# Patient Record
Sex: Male | Born: 1955 | Race: White | Hispanic: No | Marital: Married | Smoking: Current some day smoker
Health system: Southern US, Community
[De-identification: ages and names within clinical notes are randomized; demographics above are authoritative.]

## PROBLEM LIST (undated history)

## (undated) DIAGNOSIS — M869 Osteomyelitis, unspecified: Secondary | ICD-10-CM

## (undated) DIAGNOSIS — G4730 Sleep apnea, unspecified: Secondary | ICD-10-CM

## (undated) DIAGNOSIS — I10 Essential (primary) hypertension: Secondary | ICD-10-CM

## (undated) DIAGNOSIS — I495 Sick sinus syndrome: Secondary | ICD-10-CM

## (undated) DIAGNOSIS — J449 Chronic obstructive pulmonary disease, unspecified: Secondary | ICD-10-CM

## (undated) DIAGNOSIS — I2510 Atherosclerotic heart disease of native coronary artery without angina pectoris: Secondary | ICD-10-CM

## (undated) DIAGNOSIS — I4510 Unspecified right bundle-branch block: Secondary | ICD-10-CM

## (undated) DIAGNOSIS — I4819 Other persistent atrial fibrillation: Secondary | ICD-10-CM

## (undated) DIAGNOSIS — I509 Heart failure, unspecified: Secondary | ICD-10-CM

## (undated) DIAGNOSIS — E119 Type 2 diabetes mellitus without complications: Secondary | ICD-10-CM

## (undated) DIAGNOSIS — M5030 Other cervical disc degeneration, unspecified cervical region: Secondary | ICD-10-CM

## (undated) DIAGNOSIS — I4821 Permanent atrial fibrillation: Secondary | ICD-10-CM

## (undated) HISTORY — PX: BACK SURGERY: SHX140

## (undated) HISTORY — PX: CORONARY ARTERY BYPASS GRAFT: SHX141

## (undated) HISTORY — DX: Other persistent atrial fibrillation: I48.19

## (undated) HISTORY — PX: CARDIAC SURGERY: SHX584

## (undated) HISTORY — PX: LAMINECTOMY: SHX219

## (undated) HISTORY — PX: TOE AMPUTATION: SHX809

---
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
## 2009-05-01 HISTORY — PX: PACEMAKER IMPLANT: EP1218

## 2015-10-30 HISTORY — PX: PACEMAKER GENERATOR CHANGE: SHX5998

## 2016-03-08 DIAGNOSIS — E11621 Type 2 diabetes mellitus with foot ulcer: Secondary | ICD-10-CM | POA: Insufficient documentation

## 2016-03-08 DIAGNOSIS — M21371 Foot drop, right foot: Secondary | ICD-10-CM | POA: Insufficient documentation

## 2016-03-08 DIAGNOSIS — L97411 Non-pressure chronic ulcer of right heel and midfoot limited to breakdown of skin: Secondary | ICD-10-CM

## 2017-06-24 DIAGNOSIS — J449 Chronic obstructive pulmonary disease, unspecified: Secondary | ICD-10-CM | POA: Diagnosis present

## 2018-06-26 DIAGNOSIS — E119 Type 2 diabetes mellitus without complications: Secondary | ICD-10-CM

## 2018-06-29 ENCOUNTER — Inpatient Hospital Stay (HOSPITAL_COMMUNITY)
Admission: EM | Admit: 2018-06-29 | Discharge: 2018-07-13 | DRG: 456 | Disposition: A | Discharge status: Skilled Nursing Facility | Payer: BLUE CROSS/BLUE SHIELD | Attending: Student in an Organized Health Care Education/Training Program | Admitting: Student in an Organized Health Care Education/Training Program

## 2018-06-29 ENCOUNTER — Other Ambulatory Visit: Payer: Self-pay

## 2018-06-29 ENCOUNTER — Encounter (HOSPITAL_COMMUNITY): Payer: Self-pay | Admitting: Emergency Medicine

## 2018-06-29 ENCOUNTER — Emergency Department (HOSPITAL_COMMUNITY): Payer: BLUE CROSS/BLUE SHIELD

## 2018-06-29 DIAGNOSIS — Z981 Arthrodesis status: Secondary | ICD-10-CM | POA: Diagnosis not present

## 2018-06-29 DIAGNOSIS — G834 Cauda equina syndrome: Secondary | ICD-10-CM | POA: Diagnosis present

## 2018-06-29 DIAGNOSIS — E11649 Type 2 diabetes mellitus with hypoglycemia without coma: Secondary | ICD-10-CM | POA: Diagnosis present

## 2018-06-29 DIAGNOSIS — R202 Paresthesia of skin: Secondary | ICD-10-CM | POA: Diagnosis present

## 2018-06-29 DIAGNOSIS — Z7401 Bed confinement status: Secondary | ICD-10-CM

## 2018-06-29 DIAGNOSIS — M48061 Spinal stenosis, lumbar region without neurogenic claudication: Principal | ICD-10-CM | POA: Diagnosis present

## 2018-06-29 DIAGNOSIS — J449 Chronic obstructive pulmonary disease, unspecified: Secondary | ICD-10-CM | POA: Diagnosis present

## 2018-06-29 DIAGNOSIS — G8220 Paraplegia, unspecified: Secondary | ICD-10-CM | POA: Diagnosis present

## 2018-06-29 DIAGNOSIS — M21379 Foot drop, unspecified foot: Secondary | ICD-10-CM | POA: Diagnosis present

## 2018-06-29 DIAGNOSIS — J181 Lobar pneumonia, unspecified organism: Secondary | ICD-10-CM | POA: Diagnosis not present

## 2018-06-29 DIAGNOSIS — I110 Hypertensive heart disease with heart failure: Secondary | ICD-10-CM | POA: Diagnosis not present

## 2018-06-29 DIAGNOSIS — Z79899 Other long term (current) drug therapy: Secondary | ICD-10-CM | POA: Diagnosis not present

## 2018-06-29 DIAGNOSIS — Z950 Presence of cardiac pacemaker: Secondary | ICD-10-CM | POA: Diagnosis not present

## 2018-06-29 DIAGNOSIS — R200 Anesthesia of skin: Secondary | ICD-10-CM | POA: Diagnosis present

## 2018-06-29 DIAGNOSIS — I4891 Unspecified atrial fibrillation: Secondary | ICD-10-CM | POA: Diagnosis not present

## 2018-06-29 DIAGNOSIS — I509 Heart failure, unspecified: Secondary | ICD-10-CM | POA: Diagnosis present

## 2018-06-29 DIAGNOSIS — E119 Type 2 diabetes mellitus without complications: Secondary | ICD-10-CM

## 2018-06-29 DIAGNOSIS — E440 Moderate protein-calorie malnutrition: Secondary | ICD-10-CM | POA: Diagnosis present

## 2018-06-29 DIAGNOSIS — J440 Chronic obstructive pulmonary disease with acute lower respiratory infection: Secondary | ICD-10-CM | POA: Diagnosis present

## 2018-06-29 DIAGNOSIS — I340 Nonrheumatic mitral (valve) insufficiency: Secondary | ICD-10-CM | POA: Diagnosis not present

## 2018-06-29 DIAGNOSIS — D638 Anemia in other chronic diseases classified elsewhere: Secondary | ICD-10-CM | POA: Diagnosis present

## 2018-06-29 DIAGNOSIS — Z794 Long term (current) use of insulin: Secondary | ICD-10-CM

## 2018-06-29 DIAGNOSIS — I959 Hypotension, unspecified: Secondary | ICD-10-CM | POA: Diagnosis not present

## 2018-06-29 DIAGNOSIS — I472 Ventricular tachycardia: Secondary | ICD-10-CM | POA: Diagnosis present

## 2018-06-29 DIAGNOSIS — I482 Chronic atrial fibrillation, unspecified: Secondary | ICD-10-CM | POA: Diagnosis present

## 2018-06-29 DIAGNOSIS — Z01818 Encounter for other preprocedural examination: Secondary | ICD-10-CM

## 2018-06-29 DIAGNOSIS — R7881 Bacteremia: Secondary | ICD-10-CM | POA: Diagnosis not present

## 2018-06-29 DIAGNOSIS — K5903 Drug induced constipation: Secondary | ICD-10-CM | POA: Diagnosis not present

## 2018-06-29 DIAGNOSIS — M4804 Spinal stenosis, thoracic region: Secondary | ICD-10-CM | POA: Diagnosis present

## 2018-06-29 DIAGNOSIS — E785 Hyperlipidemia, unspecified: Secondary | ICD-10-CM | POA: Diagnosis present

## 2018-06-29 DIAGNOSIS — I2510 Atherosclerotic heart disease of native coronary artery without angina pectoris: Secondary | ICD-10-CM | POA: Diagnosis present

## 2018-06-29 DIAGNOSIS — R410 Disorientation, unspecified: Secondary | ICD-10-CM | POA: Diagnosis not present

## 2018-06-29 DIAGNOSIS — R7401 Elevation of levels of liver transaminase levels: Secondary | ICD-10-CM

## 2018-06-29 DIAGNOSIS — D649 Anemia, unspecified: Secondary | ICD-10-CM | POA: Diagnosis not present

## 2018-06-29 DIAGNOSIS — Z9181 History of falling: Secondary | ICD-10-CM

## 2018-06-29 DIAGNOSIS — J189 Pneumonia, unspecified organism: Secondary | ICD-10-CM | POA: Diagnosis present

## 2018-06-29 DIAGNOSIS — F17210 Nicotine dependence, cigarettes, uncomplicated: Secondary | ICD-10-CM | POA: Diagnosis present

## 2018-06-29 DIAGNOSIS — E876 Hypokalemia: Secondary | ICD-10-CM | POA: Diagnosis present

## 2018-06-29 DIAGNOSIS — I8312 Varicose veins of left lower extremity with inflammation: Secondary | ICD-10-CM | POA: Diagnosis present

## 2018-06-29 DIAGNOSIS — Z8679 Personal history of other diseases of the circulatory system: Secondary | ICD-10-CM | POA: Diagnosis not present

## 2018-06-29 DIAGNOSIS — I8311 Varicose veins of right lower extremity with inflammation: Secondary | ICD-10-CM | POA: Diagnosis present

## 2018-06-29 DIAGNOSIS — M5136 Other intervertebral disc degeneration, lumbar region: Secondary | ICD-10-CM | POA: Diagnosis present

## 2018-06-29 DIAGNOSIS — G8929 Other chronic pain: Secondary | ICD-10-CM | POA: Diagnosis present

## 2018-06-29 DIAGNOSIS — E875 Hyperkalemia: Secondary | ICD-10-CM | POA: Diagnosis not present

## 2018-06-29 DIAGNOSIS — T402X5A Adverse effect of other opioids, initial encounter: Secondary | ICD-10-CM | POA: Diagnosis not present

## 2018-06-29 DIAGNOSIS — K760 Fatty (change of) liver, not elsewhere classified: Secondary | ICD-10-CM | POA: Diagnosis not present

## 2018-06-29 DIAGNOSIS — Z539 Procedure and treatment not carried out, unspecified reason: Secondary | ICD-10-CM | POA: Diagnosis not present

## 2018-06-29 DIAGNOSIS — M4150 Other secondary scoliosis, site unspecified: Secondary | ICD-10-CM | POA: Diagnosis present

## 2018-06-29 DIAGNOSIS — R64 Cachexia: Secondary | ICD-10-CM | POA: Diagnosis present

## 2018-06-29 DIAGNOSIS — R258 Other abnormal involuntary movements: Secondary | ICD-10-CM | POA: Diagnosis not present

## 2018-06-29 DIAGNOSIS — M7989 Other specified soft tissue disorders: Secondary | ICD-10-CM | POA: Diagnosis not present

## 2018-06-29 DIAGNOSIS — Z6826 Body mass index (BMI) 26.0-26.9, adult: Secondary | ICD-10-CM

## 2018-06-29 DIAGNOSIS — I495 Sick sinus syndrome: Secondary | ICD-10-CM | POA: Diagnosis not present

## 2018-06-29 DIAGNOSIS — I878 Other specified disorders of veins: Secondary | ICD-10-CM | POA: Diagnosis present

## 2018-06-29 DIAGNOSIS — Z751 Person awaiting admission to adequate facility elsewhere: Secondary | ICD-10-CM

## 2018-06-29 DIAGNOSIS — R740 Nonspecific elevation of levels of transaminase and lactic acid dehydrogenase [LDH]: Secondary | ICD-10-CM | POA: Diagnosis not present

## 2018-06-29 DIAGNOSIS — Z9889 Other specified postprocedural states: Secondary | ICD-10-CM | POA: Diagnosis not present

## 2018-06-29 DIAGNOSIS — R296 Repeated falls: Secondary | ICD-10-CM | POA: Diagnosis present

## 2018-06-29 DIAGNOSIS — Z8711 Personal history of peptic ulcer disease: Secondary | ICD-10-CM

## 2018-06-29 DIAGNOSIS — Z419 Encounter for procedure for purposes other than remedying health state, unspecified: Secondary | ICD-10-CM

## 2018-06-29 DIAGNOSIS — Z951 Presence of aortocoronary bypass graft: Secondary | ICD-10-CM

## 2018-06-29 DIAGNOSIS — M21371 Foot drop, right foot: Secondary | ICD-10-CM | POA: Diagnosis present

## 2018-06-29 DIAGNOSIS — I872 Venous insufficiency (chronic) (peripheral): Secondary | ICD-10-CM | POA: Diagnosis not present

## 2018-06-29 DIAGNOSIS — G8918 Other acute postprocedural pain: Secondary | ICD-10-CM | POA: Diagnosis not present

## 2018-06-29 HISTORY — DX: Chronic obstructive pulmonary disease, unspecified: J44.9

## 2018-06-29 HISTORY — DX: Heart failure, unspecified: I50.9

## 2018-06-29 HISTORY — DX: Essential (primary) hypertension: I10

## 2018-06-29 HISTORY — DX: Type 2 diabetes mellitus without complications: E11.9

## 2018-06-29 LAB — COMPREHENSIVE METABOLIC PANEL
ALT: 13 U/L (ref 0–44)
AST: 22 U/L (ref 15–41)
Albumin: 1.8 g/dL — ABNORMAL LOW (ref 3.5–5.0)
Alkaline Phosphatase: 124 U/L (ref 38–126)
Anion gap: 9 (ref 5–15)
BUN: 48 mg/dL — ABNORMAL HIGH (ref 8–23)
CO2: 24 mmol/L (ref 22–32)
Calcium: 7.7 mg/dL — ABNORMAL LOW (ref 8.9–10.3)
Chloride: 104 mmol/L (ref 98–111)
Creatinine, Ser: 1.4 mg/dL — ABNORMAL HIGH (ref 0.61–1.24)
GFR calc Af Amer: >60 mL/min (ref >60)
GFR calc non Af Amer: 53 mL/min — ABNORMAL LOW (ref >60)
Glucose, Bld: 157 mg/dL — ABNORMAL HIGH (ref 70–99)
POTASSIUM: 3.1 mmol/L — AB (ref 3.5–5.1)
Sodium: 137 mmol/L (ref 135–145)
Total Bilirubin: 0.2 mg/dL — ABNORMAL LOW (ref 0.3–1.2)
Total Protein: 4.4 g/dL — ABNORMAL LOW (ref 6.5–8.1)

## 2018-06-29 LAB — INFLUENZA PANEL BY PCR (TYPE A & B)
Influenza A By PCR: NEGATIVE
Influenza B By PCR: NEGATIVE

## 2018-06-29 LAB — CBC WITH DIFFERENTIAL/PLATELET
Abs Immature Granulocytes: 0.1 10*3/uL — ABNORMAL HIGH (ref 0.00–0.07)
Basophils Absolute: 0 10*3/uL (ref 0.0–0.1)
Basophils Relative: 0 %
EOS ABS: 0 10*3/uL (ref 0.0–0.5)
Eosinophils Relative: 0 %
HCT: 36.3 % — ABNORMAL LOW (ref 39.0–52.0)
Hemoglobin: 10.5 g/dL — ABNORMAL LOW (ref 13.0–17.0)
Immature Granulocytes: 1 %
Lymphocytes Relative: 3 %
Lymphs Abs: 0.4 10*3/uL — ABNORMAL LOW (ref 0.7–4.0)
MCH: 25.3 pg — AB (ref 26.0–34.0)
MCHC: 28.9 g/dL — AB (ref 30.0–36.0)
MCV: 87.5 fL (ref 80.0–100.0)
MONO ABS: 0.3 10*3/uL (ref 0.1–1.0)
Monocytes Relative: 2 %
Neutro Abs: 14.1 10*3/uL — ABNORMAL HIGH (ref 1.7–7.7)
Neutrophils Relative %: 94 %
Platelets: 209 10*3/uL (ref 150–400)
RBC: 4.15 MIL/uL — ABNORMAL LOW (ref 4.22–5.81)
RDW: 18.4 % — AB (ref 11.5–15.5)
WBC: 14.9 10*3/uL — ABNORMAL HIGH (ref 4.0–10.5)
nRBC: 0 % (ref 0.0–0.2)

## 2018-06-29 LAB — GLUCOSE, CAPILLARY
Glucose-Capillary: 178 mg/dL — ABNORMAL HIGH (ref 70–99)
Glucose-Capillary: 116 mg/dL — ABNORMAL HIGH (ref 70–99)

## 2018-06-29 LAB — PROTIME-INR
INR: 1 (ref 0.8–1.2)
Prothrombin Time: 12.8 seconds (ref 11.4–15.2)

## 2018-06-29 LAB — C-REACTIVE PROTEIN: CRP: 4.7 mg/dL — ABNORMAL HIGH (ref <1.0)

## 2018-06-29 LAB — APTT: aPTT: 32 seconds (ref 24–36)

## 2018-06-29 LAB — SEDIMENTATION RATE: SED RATE: 14 mm/h (ref 0–16)

## 2018-06-29 LAB — MAGNESIUM: Magnesium: 1.8 mg/dL (ref 1.7–2.4)

## 2018-06-29 MED ORDER — ENOXAPARIN SODIUM 40 MG/0.4ML ~~LOC~~ SOLN
40.0000 mg | SUBCUTANEOUS | Status: DC
  Administered 2018-06-29 – 2018-07-03 (×5): 40 mg via SUBCUTANEOUS
  Filled 2018-06-29 (×5): qty 0.4

## 2018-06-29 MED ORDER — OXYCODONE-ACETAMINOPHEN 5-325 MG PO TABS
1.0000 | ORAL_TABLET | Freq: Four times a day (QID) | ORAL | Status: DC | PRN
  Administered 2018-06-29 – 2018-06-30 (×3): 2 via ORAL
  Filled 2018-06-29: qty 2
  Filled 2018-06-29: qty 1
  Filled 2018-06-29 (×2): qty 2

## 2018-06-29 MED ORDER — POTASSIUM CHLORIDE CRYS ER 20 MEQ PO TBCR
40.0000 meq | EXTENDED_RELEASE_TABLET | Freq: Once | ORAL | Status: AC
  Administered 2018-06-29: 40 meq via ORAL
  Filled 2018-06-29: qty 2

## 2018-06-29 MED ORDER — ONDANSETRON HCL 4 MG/2ML IJ SOLN: 4.0000 mg | Freq: Four times a day (QID) | INTRAMUSCULAR | Status: DC | PRN

## 2018-06-29 MED ORDER — OXYCODONE-ACETAMINOPHEN 5-325 MG PO TABS
1.0000 | ORAL_TABLET | Freq: Once | ORAL | Status: AC
  Administered 2018-06-29: 1 via ORAL
  Filled 2018-06-29: qty 1

## 2018-06-29 MED ORDER — INSULIN GLARGINE 100 UNIT/ML ~~LOC~~ SOLN
12.0000 [IU] | Freq: Every day | SUBCUTANEOUS | Status: DC
  Administered 2018-06-29: 12 [IU] via SUBCUTANEOUS
  Filled 2018-06-29: qty 0.12

## 2018-06-29 MED ORDER — PANTOPRAZOLE SODIUM 40 MG PO TBEC
40.0000 mg | DELAYED_RELEASE_TABLET | Freq: Two times a day (BID) | ORAL | Status: DC
  Administered 2018-06-29 – 2018-07-04 (×11): 40 mg via ORAL
  Filled 2018-06-29 (×12): qty 1

## 2018-06-29 MED ORDER — SODIUM CHLORIDE 0.9 % IV SOLN
1.0000 g | INTRAVENOUS | Status: DC
  Administered 2018-06-30 – 2018-07-04 (×5): 1 g via INTRAVENOUS
  Filled 2018-06-29 (×6): qty 10

## 2018-06-29 MED ORDER — INSULIN ASPART 100 UNIT/ML ~~LOC~~ SOLN
0.0000 [IU] | Freq: Every day | SUBCUTANEOUS | Status: DC
  Administered 2018-06-30: 3 [IU] via SUBCUTANEOUS
  Administered 2018-07-02 – 2018-07-04 (×2): 2 [IU] via SUBCUTANEOUS
  Administered 2018-07-06: 3 [IU] via SUBCUTANEOUS
  Administered 2018-07-07: 100 [IU] via SUBCUTANEOUS
  Administered 2018-07-10: 2 [IU] via SUBCUTANEOUS

## 2018-06-29 MED ORDER — ZOLPIDEM TARTRATE 5 MG PO TABS
5.0000 mg | ORAL_TABLET | Freq: Every evening | ORAL | Status: DC | PRN
  Administered 2018-06-29 – 2018-07-08 (×8): 5 mg via ORAL
  Filled 2018-06-29 (×8): qty 1

## 2018-06-29 MED ORDER — IPRATROPIUM-ALBUTEROL 0.5-2.5 (3) MG/3ML IN SOLN
3.0000 mL | Freq: Once | RESPIRATORY_TRACT | Status: AC
  Administered 2018-06-29: 3 mL via RESPIRATORY_TRACT
  Filled 2018-06-29: qty 3

## 2018-06-29 MED ORDER — DEXTROSE 5 % IV SOLN: 250.0000 mg | INTRAVENOUS | Status: DC

## 2018-06-29 MED ORDER — SODIUM CHLORIDE 0.9 % IV SOLN
500.0000 mg | Freq: Once | INTRAVENOUS | Status: AC
  Administered 2018-06-29: 500 mg via INTRAVENOUS
  Filled 2018-06-29: qty 500

## 2018-06-29 MED ORDER — SODIUM CHLORIDE 0.9 % IV SOLN
500.0000 mg | INTRAVENOUS | Status: DC
  Administered 2018-06-30 – 2018-07-01 (×2): 500 mg via INTRAVENOUS
  Filled 2018-06-29 (×3): qty 500

## 2018-06-29 MED ORDER — DIGOXIN 125 MCG PO TABS
0.1250 mg | ORAL_TABLET | Freq: Every day | ORAL | Status: DC
  Administered 2018-07-01 – 2018-07-13 (×11): 0.125 mg via ORAL
  Filled 2018-06-29 (×13): qty 1

## 2018-06-29 MED ORDER — ACETAMINOPHEN 500 MG PO TABS
1000.0000 mg | ORAL_TABLET | Freq: Four times a day (QID) | ORAL | Status: DC | PRN
  Administered 2018-07-01 – 2018-07-05 (×11): 1000 mg via ORAL
  Filled 2018-06-29 (×11): qty 2

## 2018-06-29 MED ORDER — INSULIN ASPART 100 UNIT/ML ~~LOC~~ SOLN: 3.0000 [IU] | Freq: Three times a day (TID) | SUBCUTANEOUS | Status: DC

## 2018-06-29 MED ORDER — SODIUM CHLORIDE 0.9 % IV SOLN
1.0000 g | Freq: Once | INTRAVENOUS | Status: AC
  Administered 2018-06-29: 1 g via INTRAVENOUS
  Filled 2018-06-29: qty 10

## 2018-06-29 MED ORDER — ATORVASTATIN CALCIUM 40 MG PO TABS
40.0000 mg | ORAL_TABLET | Freq: Every day | ORAL | Status: DC
  Administered 2018-06-29 – 2018-07-13 (×12): 40 mg via ORAL
  Filled 2018-06-29 (×14): qty 1

## 2018-06-29 MED ORDER — METOPROLOL SUCCINATE ER 100 MG PO TB24
100.0000 mg | ORAL_TABLET | Freq: Two times a day (BID) | ORAL | Status: DC
  Administered 2018-06-29: 100 mg via ORAL
  Filled 2018-06-29 (×2): qty 1

## 2018-06-29 MED ORDER — INSULIN ASPART 100 UNIT/ML ~~LOC~~ SOLN
0.0000 [IU] | Freq: Three times a day (TID) | SUBCUTANEOUS | Status: DC
  Administered 2018-06-29 – 2018-06-30 (×3): 2 [IU] via SUBCUTANEOUS
  Administered 2018-07-01: 4 [IU] via SUBCUTANEOUS
  Administered 2018-07-01: 2 [IU] via SUBCUTANEOUS
  Administered 2018-07-01: 7 [IU] via SUBCUTANEOUS
  Administered 2018-07-02: 2 [IU] via SUBCUTANEOUS
  Administered 2018-07-03: 1 [IU] via SUBCUTANEOUS
  Administered 2018-07-03 – 2018-07-04 (×3): 2 [IU] via SUBCUTANEOUS
  Administered 2018-07-06: 1 [IU] via SUBCUTANEOUS
  Administered 2018-07-06: 3 [IU] via SUBCUTANEOUS
  Administered 2018-07-06: 2 [IU] via SUBCUTANEOUS
  Administered 2018-07-07 – 2018-07-08 (×2): 1 [IU] via SUBCUTANEOUS
  Administered 2018-07-09: 2 [IU] via SUBCUTANEOUS
  Administered 2018-07-09 – 2018-07-13 (×4): 1 [IU] via SUBCUTANEOUS

## 2018-06-29 MED ORDER — ONDANSETRON HCL 4 MG PO TABS
4.0000 mg | ORAL_TABLET | Freq: Four times a day (QID) | ORAL | Status: DC | PRN
  Administered 2018-07-02: 4 mg via ORAL
  Filled 2018-06-29 (×2): qty 1

## 2018-06-29 MED ORDER — IPRATROPIUM-ALBUTEROL 0.5-2.5 (3) MG/3ML IN SOLN
3.0000 mL | Freq: Four times a day (QID) | RESPIRATORY_TRACT | Status: DC | PRN
  Administered 2018-06-29 – 2018-07-01 (×5): 3 mL via RESPIRATORY_TRACT
  Filled 2018-06-29 (×4): qty 3

## 2018-06-29 MED ORDER — DEXTROSE 5 % IV SOLN: 250.0000 mg | Freq: Once | INTRAVENOUS | Status: DC

## 2018-06-29 MED ORDER — SODIUM CHLORIDE 0.9 % IV BOLUS
500.0000 mL | Freq: Once | INTRAVENOUS | Status: AC
  Administered 2018-06-29: 500 mL via INTRAVENOUS

## 2018-06-29 NOTE — Consult Note (Signed)
Reason for Consult:paraparesis Referring Physician: [facility] department  Sharlene Motts Kitchens is an 63 y.o. male.  HPI: 63 year old gentleman has had long-standing difficulties with his back he previously undergone a multilevel decompressive laminectomy 13 years ago who presented to my office a few weeks ago with bilateral foot drops and looks to me weakness of the been building and progressing for the several weeks leading into that. We ordered a CT myelogram as the patient has pacemaker was unable to get an MRI scan. 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 This still spinal fluid flow there the distal part of spinal cord the biggest part of his weakness of his being generated by his complete block at L4 however patient looks significantly cachectic malnourished and I think there is a possibility could have an underlying infection is not yet been diagnosed. With a significant multiple medical comorbidities I recommended we transitioned over to the ER be admitted to medicine undergo a full preoperative workup and evaluation and then I will plan decompression and stabilization procedure more likely Saturday.  Past Medical History:  Diagnosis Date  . CHF (congestive heart failure) (HCC)   . COPD (chronic obstructive pulmonary disease) (HCC)   . Diabetes mellitus without complication (HCC)   . Hypertension     Past Surgical  History:  Procedure Laterality Date  . BACK SURGERY    . CARDIAC SURGERY      No family history on file.  Social History:  has no history on file for tobacco, alcohol, and drug.  Allergies: No Known Allergies  Medications: I have reviewed the patient's current medications.  Results for orders placed or performed during the hospital encounter of 06/29/18 (from the past 48 hour(s))  CBC with Differential     Status: Abnormal   Collection Time: 06/29/18  2:59 PM  Result Value Ref Range   WBC 14.9 (H) 4.0 - 10.5 K/uL   RBC 4.15 (L) 4.22 - 5.81 MIL/uL   Hemoglobin 10.5 (L) 13.0 - 17.0 g/dL   HCT 88.2 (L) 80.0 - 34.9 %   MCV 87.5 80.0 - 100.0 fL   MCH 25.3 (L) 26.0 - 34.0 pg   MCHC 28.9 (L) 30.0 - 36.0 g/dL   RDW 17.9 (H) 15.0 - 56.9 %   Platelets 209 150 - 400 K/uL   nRBC 0.0 0.0 - 0.2 %   Neutrophils Relative % 94 %   Neutro Abs 14.1 (H) 1.7 - 7.7 K/uL   Lymphocytes Relative 3 %   Lymphs Abs 0.4 (L) 0.7 - 4.0 K/uL   Monocytes Relative 2 %   Monocytes Absolute 0.3 0.1 - 1.0 K/uL   Eosinophils Relative 0 %   Eosinophils Absolute 0.0 0.0 - 0.5 K/uL   Basophils Relative 0 %   Basophils Absolute 0.0 0.0 - 0.1 K/uL   Immature Granulocytes 1 %   Abs Immature Granulocytes 0.10 (H) 0.00 - 0.07 K/uL    Comment: Performed at [facility]  Hospital Lab, [address] [place], [place] [address]  Comprehensive metabolic panel     Status: Abnormal   Collection Time: 06/29/18  2:59 PM  Result Value Ref Range   Sodium 137 135 - 145 mmol/L   Potassium 3.1 (L) 3.5 - 5.1 mmol/L   Chloride 104 98 - 111 mmol/L   CO2 24 22 - 32 mmol/L   Glucose, Bld 157 (H) 70 - 99 mg/dL   BUN 48 (H) 8 - 23 mg/dL   Creatinine, Ser 7.22 (H) 0.61 - 1.24 mg/dL   Calcium 7.7 (L) 8.9 - 10.3 mg/dL   Total Protein 4.4 (L) 6.5 - 8.1 g/dL   Albumin 1.8 (L) 3.5 - 5.0 g/dL   AST 22 15 - 41 U/L   ALT 13 0 - 44 U/L   Alkaline Phosphatase 124 38 - 126 U/L   Total Bilirubin 0.2 (L) 0.3 - 1.2 mg/dL   GFR calc non Af Amer 53  (L) >60 mL/min   GFR calc Af Amer >60 >60 mL/min   Anion gap 9 5 - 15    Comment: Performed at [facility], [address], [place], [place] [address]  Sedimentation rate     Status: None   Collection Time: 06/29/18  2:59 PM  Result Value Ref Range   Sed Rate 14 0 - 16 mm/hr    Comment: Performed at [facility], [address] [place], [place] [address]  C-reactive protein     Status: Abnormal   Collection Time: 06/29/18  2:59 PM  Result Value Ref Range   CRP 4.7 (H) <1.0 mg/dL    Comment: Performed at [facility], [address] [place], [place] [address]  APTT     Status: None   Collection Time: 06/29/18  2:59 PM  Result Value Ref Range   aPTT 32 24 - 36 seconds    Comment: Performed at [facility] Lab, [address], [place], [place] [address]  Protime-INR     Status: None   Collection Time: 06/29/18  2:59 PM  Result Value Ref Range   Prothrombin Time 12.8 11.4 - 15.2 seconds   INR 1.0 0.8 - 1.2    Comment: (NOTE) INR goal varies based on device and disease states. Performed at [facility] Lab, [address] [place], [place] [address]   Influenza panel by PCR (type A & B)     Status: None   Collection Time: 06/29/18  3:06 PM  Result Value Ref Range   Influenza A By PCR NEGATIVE NEGATIVE   Influenza B By PCR NEGATIVE NEGATIVE    Comment: (NOTE) The Xpert Xpress Flu assay is intended as an aid in the diagnosis of  influenza and should not be used as a sole basis for treatment.  This  assay is FDA approved for nasopharyngeal swab specimens only. Nasal  washings and aspirates are unacceptable for Xpert Xpress Flu testing. Performed at [facility] Lab, [address] [place], [place] [address]   Magnesium     Status: None   Collection Time: 06/29/18  3:54 PM  Result Value Ref Range   Magnesium 1.8 1.7 - 2.4 mg/dL    Comment: Performed at [facility], [address], [place], [place] [address]    Dg Chest 2 View  Result Date:  06/29/2018 CLINICAL DATA:  Cough for 1 week. EXAM: CHEST - 2 VIEW COMPARISON:  None. FINDINGS: There is airspace disease  in the right lower lobe most consistent with pneumonia. Left lung is clear. Heart size is mildly enlarged. The patient is status post CABG with a pacing device in place. No pneumothorax or pleural fluid. No acute or focal bony abnormality. IMPRESSION: Right lower lobe airspace disease most consistent with pneumonia. Electronically Signed   By: Drusilla Kanner M.D.   On: 06/29/2018 15:03    Review of Systems  Gastrointestinal: Positive for diarrhea.  Musculoskeletal: Positive for back pain.  Neurological: Positive for sensory change and focal weakness.   Blood pressure 98/80, pulse 60, temperature 98.1 F (36.7 C), temperature source Oral, resp. rate 19, height 6\' 2"  (1.88 m), weight 92.5 kg, SpO2 92 %. Physical Exam  Constitutional: He is oriented to person, place, and time.  Neurological: He is alert and oriented to person, place, and time. GCS eye subscore is 4. GCS verbal subscore is 5. GCS motor subscore is 6.  Patient has significant lower Cordelia Pen para para Mare Loan is 4-4 minus out of 5 iliopsoas bilaterally 44 minus out of 5 quadriceps he has bilateral complete foot drops sensation appears to be grossly intact to light touch    Assessment/Plan: 63 year old with progressive para para cyst and what looks like cauda equina syndrome this now been going on for over a week L most 2 weeks para Danielle Dess is almost over a month. Myelogram shows complete block and we will plan decompressive laminectomy and fusion however we need to do a preoperative medical assessment rule out any underlying infection check his white blood cell count is sedimentation rate and C-reactive protein. Medical clearance for a L3 to L S1 decompression fusion.  Alfonso Carden P 06/29/2018, 6:03 PM

## 2018-06-29 NOTE — ED Notes (Signed)
Patient transported to X-ray 

## 2018-06-29 NOTE — ED Triage Notes (Signed)
Pt presents to ED from neurosurgery office via patient transport from [place]  where he lives. Pt had pre-op visit today for neurosurgery on Saturday and transport sts the office wanted him to be admitted today for increased pain, drop foot, and loss of bowel control.

## 2018-06-29 NOTE — H&P (Addendum)
Date: 06/29/2018               Patient Name:  Henry Mcpherson MRN: [id number]  DOB: 03/15/1956 Age / Sex: 63 y.o., male   PCP: System, Pcp Not In         Medical Service: Internal Medicine Teaching Service         Attending Physician: Dr. Earl Lagos, MD    First Contact: Dr. Maryla Morrow Pager: [phone]  Second Contact: Dr. Frances Furbish Pager: [phone]       After Hours (After 5p/  First Contact Pager: [phone]  weekends / holidays): Second Contact Pager: [phone]   Chief Complaint: Low back pain, lower extremity weakness  History of Present Illness: 63 year old male with past medical history significant for CHF, A. fib, DM 2, lumbar stenosis, worsening of back pain, inability to walk.  He has history of lumbar stenosis status post fusion surgery 15 years ago, but mentions that since December his back pain got worse in any few weeks, he developed some lower extremity weakness as well as tingling.  Since last week patient was not able to walk and almost always at bed due to lower extremity weakness and back pain.  Per family, he also had fecal incontinency.  He has had multiple falls due to weakness and balance issue.  No upper extremity weakness.  His pain has been 8-9/10.  He only took Tylenol at home that did not help significantly. (Cannot take NSAIDs due to history of peptic ulcer disease).  He saw neurosurgery (Dr. Wynetta Emery outpatient) and was recommended surgery. (No imagine or note available in chart yet). He was advised to come to ED for pre-op evaluation and surgery. In ED he was found to have pneumonia. Patient denies any fever or chills. He feels short of breath sometimes because he can not take deep breath due to back pain. No sick contact.   Meds:  Current Meds  Medication Sig  . acetaminophen (TYLENOL) 500 MG tablet Take 1,000 mg by mouth every 6 (six) hours as needed for mild pain.  Marland Kitchen albuterol (PROVENTIL HFA;VENTOLIN HFA) 108 (90 Base) MCG/ACT inhaler Inhale 1-2 puffs into  the lungs every 6 (six) hours as needed for wheezing or shortness of breath.  Marland Kitchen atorvastatin (LIPITOR) 40 MG tablet Take 40 mg by mouth daily.  . digoxin (LANOXIN) 0.125 MG tablet Take 0.125 mg by mouth daily.  . furosemide (LASIX) 40 MG tablet Take 40 mg by mouth daily.  Marland Kitchen LEVEMIR FLEXTOUCH 100 UNIT/ML Pen Inject 25-30 Units into the skin every evening.   . metoprolol succinate (TOPROL-XL) 100 MG 24 hr tablet Take 100 mg by mouth 2 (two) times daily.  . pantoprazole (PROTONIX) 40 MG tablet Take 40 mg by mouth 2 (two) times daily.  . Probiotic Product (PROBIOTIC DAILY PO) Take 1 capsule by mouth daily.  Marland Kitchen zolpidem (AMBIEN) 10 MG tablet Take 5 mg by mouth at bedtime as needed for sleep.      Allergies: Allergies as of 06/29/2018  . (No Known Allergies)   Past Medical History:  Diagnosis Date  . CHF (congestive heart failure) (HCC)   . COPD (chronic obstructive pulmonary disease) (HCC)   . Diabetes mellitus without complication (HCC)   . Hypertension     Family History:  No family Hx  Social History:  Smokes 1 pack of cigarettes every day Drinks alcohol occasionally No illicit drug use  Review of Systems: A complete ROS was negative except as per HPI.  Physical Exam: Blood pressure 98/80, pulse 60, temperature 98.1 F (36.7 C), temperature source Oral, resp. rate 19, height 6\' 2"  (1.88 m), weight 92.5 kg, SpO2 92 %. Physical Exam Constitutional:      Appearance: He is ill-appearing.  HENT:     Head: Normocephalic and atraumatic.     Mouth/Throat:     Mouth: Mucous membranes are dry.  Eyes:     Extraocular Movements: Extraocular movements intact.  Cardiovascular:     Heart sounds: Normal heart sounds. No murmur.  Pulmonary:     Effort: Pulmonary effort is normal.     Breath sounds: Examination of the left-upper field reveals decreased breath sounds. Examination of the left-middle field reveals decreased breath sounds. Decreased breath sounds and rhonchi present.    Abdominal:     General: Bowel sounds are normal.     Palpations: Abdomen is soft.     Tenderness: There is no abdominal tenderness.  Musculoskeletal:        General: No swelling or tenderness.     Right lower leg: Edema present.     Left lower leg: Edema present.     Comments: Distal of lower extremities are cold. Pulses are weak. 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 Has 1+ bilateral pitting edema.  Neurological:     Mental Status: He is oriented to person, place, and time.     Comments: Sensation is normal and symmetric. Bilateral lower extremities motor:  3-4/5      EKG: personally reviewed my interpretation is borderline bradycardia, afib, Q wave in ant leads. borderline QD  CXR: personally reviewed my interpretation is right lower lobe opacity  Assessment & Plan by Problem: Active Problems:   Community acquired pneumonia  63 y.o. male with PMHx of  COPD, CHF, T2DM, Afib, pace maker, lumbar disc disease s/p fusion wo scented with new pneumonia as well as worsening neurological symptoms consistent with worsening spinal stenosis.   Lumbar stenosis: Patient with history of lumbar stenosis status post surgery several years ago, presented with worsening of back pain, as well as neurologic symptoms: bilateral lower extremity weakness, tingling, fecal incontinency.   He has not been able to walk since last week.  Patient was seen neurosurgeon (Dr. Wynetta Emery) out patient and was recommended surgery. Patient came to ED today due to worsening of symptoms and not feeling good generally.  Neurosurgery consulted will see this evening.   -Percocet 5-325 mg, 1-2 tablets every 6 hours PRN for severe pain -Consult to neurosurgery, appreciate their recommendation -Tylenol 1 g every 6 hours PRN for mild pain -Zofran 4 mg p.o. every 6 hours PRN (be causious with boredrline QT) -Cardiac monitoring -CBC tomorrow -CMP tomorrow -HIV antibody -INR-->1  CAP COPD Patient with productive cough today at  ED. (Denies major cough at home.  No fever or chills.  No shortness of breath)  Chest x-ray with evidence of pneumonia.  Has leukocytosis at 14000. Treating him for CAP.  Does not seem to be COPD exacerbation.   -Azithromycin 500 mg IV -Ceftriaxone 1 g IV -DuoNeb nebulizer every 6 hours PRN -Flu test--> Negative -f/u blood culture   CAD status post CABG CHF (no further information on chart) Afib Volume status is okay except some bilateral lower extremity edema.  Is on Digoxin and metoprolol at home. No RVR (currently pulse rate around 60s)  -Digoxin level -Lipitor 40 mg daily -Digoxin 0.125 mg p.o. daily -Metoprolol succinate 100 mg p.o. twice daily -Mg: Nl 1.8   Insulin dependent type 2  DM:  -NovoLog 3 units subcu 3 times daily -Lantus 12 units subcu at bedtime -Sensitive SSI -CBG monitoring   Anemia: Patient looks pale on exam. Per family, this has been due to gi ulcer. No previous lab test on chart. CBC at ED with normocytic anemia with Hb 10. Can be anemia of chronic disease.   -Monitor CBC -Although normocytic, can still be IDA. Will check Ferritin after acute illness improves. -Protonix -Monitor Hb and symptoms as he is on Levonox  dispo: Admit patient to Inpatient with expected length of stay greater than 2 midnights.   Diet: HH/CM (then N.p.o. for surgery) IV fluid: VTE ppx: Lovenox Code status: Full  Signed: Chevis Pretty, MD 06/29/2018, 6:36 PM  Pager: [phone]

## 2018-06-29 NOTE — ED Provider Notes (Signed)
[name]   CSN: [id number] Arrival date & time: 06/29/18  1239    History   Chief Complaint Chief Complaint  Patient presents with  . Pre-op Exam    HPI Henry Mcpherson is a 63 y.o. male.     HPI   Pt is a 63 y/o male with a h/o CHF, COPD, DM, HTN, HLD, pAfib, anemia, PUD, lumbar fusion (15 years ago) who presents to the ED for evaluation. Pt states that he was seen by his neurosurgeon, Dr. Wynetta Emery, PTA for evaluation of ongoing back pain that has been worsening over the last several weeks. He was seen in the office PTA and was advised to come to the ED for pre-op evaluation and admission to the hospital in anticipation of surgery.  Pt notes that he has a h/o chronic back pain s/p lumbar fusion 15 years ago and after starting PT recently his back pain has worsened. Describes severe pain to the mid lower back that is constant. He has tried ibuprofen without significant relief.  He states he has had foot drop for some time and over the last 1 to 2 weeks he has had progressing bilateral lower extremity weakness.  He has also recently developed loss of bowel control.  He denies loss of bladder control or urinary retention.  He denies numbness to his legs.  He denies any recent fevers.  He denies chest pain or shortness of breath.  He has been coughing recently.  He states that he swallowed some water run yesterday and since then he has been coughing.  No other URI symptoms.  No significant abdominal pain.  No nausea vomiting or diarrhea.  He denies any bloody stools dark stools.  Past Medical History:  Diagnosis Date  . CHF (congestive heart failure) (HCC)   . COPD (chronic obstructive pulmonary disease) (HCC)   . Diabetes mellitus without complication (HCC)   . Hypertension     Patient Active Problem List   Diagnosis Date Noted  . Community acquired pneumonia 06/29/2018    Past Surgical History:  Procedure Laterality Date    . BACK SURGERY    . CARDIAC SURGERY          Home Medications    Prior to Admission medications   Medication Sig Start Date End Date Taking? Authorizing Provider  acetaminophen (TYLENOL) 500 MG tablet Take 1,000 mg by mouth every 6 (six) hours as needed for mild pain.   Yes [provider]  albuterol (PROVENTIL HFA;VENTOLIN HFA) 108 (90 Base) MCG/ACT inhaler Inhale 1-2 puffs into the lungs every 6 (six) hours as needed for wheezing or shortness of breath.   Yes [provider]  atorvastatin (LIPITOR) 40 MG tablet Take 40 mg by mouth daily.   Yes [provider]  digoxin (LANOXIN) 0.125 MG tablet Take 0.125 mg by mouth daily. 05/04/18  Yes [provider]  furosemide (LASIX) 40 MG tablet Take 40 mg by mouth daily.   Yes [provider]  LEVEMIR FLEXTOUCH 100 UNIT/ML Pen Inject 25-30 Units into the skin every evening.  06/26/18  Yes [provider]  metoprolol succinate (TOPROL-XL) 100 MG 24 hr tablet Take 100 mg by mouth 2 (two) times daily. 06/16/18  Yes [provider]  pantoprazole (PROTONIX) 40 MG tablet Take 40 mg by mouth 2 (two) times daily. 05/26/18  Yes [provider]  Probiotic Product (PROBIOTIC DAILY PO) Take 1 capsule by mouth daily.   Yes  [provider]  zolpidem (AMBIEN) 10 MG tablet Take 5 mg by mouth at bedtime as needed for sleep.    Yes [provider]    Family History No family history on file.  Social History Social History   Tobacco Use  . Smoking status: Not on file  Substance Use Topics  . Alcohol use: Not on file  . Drug use: Not on file     Allergies   Patient has no known allergies.   Review of Systems Review of Systems  Constitutional: Negative for chills and fever.  HENT: Negative for ear pain and sore throat.   Eyes: Negative for pain and visual disturbance.  Respiratory: Negative for cough and shortness of breath.   Cardiovascular: Negative for chest  pain and palpitations.  Gastrointestinal: Negative for abdominal pain and vomiting.  Genitourinary: Negative for dysuria and hematuria.  Musculoskeletal: Negative for arthralgias and back pain.  Skin: Negative for color change and rash.  Neurological: Negative for seizures and syncope.  All other systems reviewed and are negative.    Physical Exam Updated Vital Signs BP 100/60   Pulse 66   Temp 98.5 F (36.9 C) (Oral)   Resp 17   Ht 6\' 2"  (1.88 m)   Wt 92.5 kg   SpO2 98%   BMI 26.19 kg/m   Physical Exam Vitals signs and nursing note reviewed.  Constitutional:      Appearance: He is well-developed.     Comments: Chronically ill appearing  HENT:     Head: Normocephalic and atraumatic.     Mouth/Throat:     Mouth: Mucous membranes are dry.  Eyes:     Conjunctiva/sclera: Conjunctivae normal.  Neck:     Musculoskeletal: Neck supple.  Cardiovascular:     Rate and Rhythm: Normal rate and regular rhythm.     Heart sounds: Normal heart sounds. No murmur.  Pulmonary:     Effort: Pulmonary effort is normal. No respiratory distress.     Breath sounds: Wheezing, rhonchi and rales (RLL) present.     Comments: No tachypnea. Speaking in full sentences Abdominal:     General: Bowel sounds are normal.     Palpations: Abdomen is soft.     Tenderness: There is no abdominal tenderness. There is no guarding or rebound.  Musculoskeletal:     Comments: Trace ble edema. Multiple well healing ulcers to bilat feet without evidence of infection. Midline TTP to the lumbar spine which reproduces pain.   Skin:    General: Skin is warm and dry.  Neurological:     Mental Status: He is alert.     Comments: 3/5 strength to ble with hip flexion, knee flexion/extension, dorsiflexion and plantarflexion. Plantarflexion and dorsiflexion is weaker on the right side, 2/5. Sensation is grossly intact to BLE.       ED Treatments / Results  Labs (all labs ordered are listed, but only abnormal results  are displayed) Labs Reviewed  CBC WITH DIFFERENTIAL/PLATELET - Abnormal; Notable for the following components:      Result Value   WBC 14.9 (*)    RBC 4.15 (*)    Hemoglobin 10.5 (*)    HCT 36.3 (*)    MCH 25.3 (*)    MCHC 28.9 (*)    RDW 18.4 (*)    Neutro Abs 14.1 (*)    Lymphs Abs 0.4 (*)    Abs Immature Granulocytes 0.10 (*)    All other components within normal limits  COMPREHENSIVE METABOLIC  PANEL - Abnormal; Notable for the following components:   Potassium 3.1 (*)    Glucose, Bld 157 (*)    BUN 48 (*)    Creatinine, Ser 1.40 (*)    Calcium 7.7 (*)    Total Protein 4.4 (*)    Albumin 1.8 (*)    Total Bilirubin 0.2 (*)    GFR calc non Af Amer 53 (*)    All other components within normal limits  C-REACTIVE PROTEIN - Abnormal; Notable for the following components:   CRP 4.7 (*)    All other components within normal limits  CULTURE, BLOOD (ROUTINE X 2)  CULTURE, BLOOD (ROUTINE X 2)  SEDIMENTATION RATE  INFLUENZA PANEL BY PCR (TYPE A & B)  APTT  PROTIME-INR  MAGNESIUM  DIGOXIN LEVEL  HIV ANTIBODY (ROUTINE TESTING W REFLEX)  COMPREHENSIVE METABOLIC PANEL  CBC  PROTIME-INR    EKG EKG Interpretation  Date/Time:  Thursday June 29 2018 13:59:59 EST Ventricular Rate:  82 PR Interval:    QRS Duration: 177 QT Interval:  475 QTC Calculation: 555 R Axis:   22 Text Interpretation:  sinus? Left bundle branch block No old tracing to compare Reconfirmed by Raeford Razor [phone]) on 06/29/2018 2:57:31 PM   Radiology Dg Chest 2 View  Result Date: 06/29/2018 CLINICAL DATA:  Cough for 1 week. EXAM: CHEST - 2 VIEW COMPARISON:  None. FINDINGS: There is airspace disease in the right lower lobe most consistent with pneumonia. Left lung is clear. Heart size is mildly enlarged. The patient is status post CABG with a pacing device in place. No pneumothorax or pleural fluid. No acute or focal bony abnormality. IMPRESSION: Right lower lobe airspace disease most consistent with  pneumonia. Electronically Signed   By: Drusilla Kanner M.D.   On: 06/29/2018 15:03    Procedures Procedures (including critical care time)  Medications Ordered in ED Medications  atorvastatin (LIPITOR) tablet 40 mg (has no administration in time range)  digoxin (LANOXIN) tablet 0.125 mg (has no administration in time range)  pantoprazole (PROTONIX) EC tablet 40 mg (has no administration in time range)  zolpidem (AMBIEN) tablet 5 mg (has no administration in time range)  metoprolol succinate (TOPROL-XL) 24 hr tablet 100 mg (has no administration in time range)  acetaminophen (TYLENOL) tablet 1,000 mg (has no administration in time range)  enoxaparin (LOVENOX) injection 40 mg (has no administration in time range)  ondansetron (ZOFRAN) tablet 4 mg (has no administration in time range)    Or  ondansetron (ZOFRAN) injection 4 mg (has no administration in time range)  insulin glargine (LANTUS) injection 12 Units (has no administration in time range)  insulin aspart (novoLOG) injection 0-9 Units (has no administration in time range)  insulin aspart (novoLOG) injection 0-5 Units (has no administration in time range)  insulin aspart (novoLOG) injection 3 Units (has no administration in time range)  ipratropium-albuterol (DUONEB) 0.5-2.5 (3) MG/3ML nebulizer solution 3 mL (has no administration in time range)  oxyCODONE-acetaminophen (PERCOCET/ROXICET) 5-325 MG per tablet 1-2 tablet (has no administration in time range)  ipratropium-albuterol (DUONEB) 0.5-2.5 (3) MG/3ML nebulizer solution 3 mL (3 mLs Nebulization Given 06/29/18 1502)  sodium chloride 0.9 % bolus 500 mL (0 mLs Intravenous Stopped 06/29/18 1636)  cefTRIAXone (ROCEPHIN) 1 g in sodium chloride 0.9 % 100 mL IVPB (0 g Intravenous Stopped 06/29/18 1600)  azithromycin (ZITHROMAX) 500 mg in sodium chloride 0.9 % 250 mL IVPB (500 mg Intravenous New Bag/Given 06/29/18 1546)  oxyCODONE-acetaminophen (PERCOCET/ROXICET) 5-325 MG per tablet 1 tablet  (  1 tablet Oral Given 06/29/18 1540)  potassium chloride SA (K-DUR,KLOR-CON) CR tablet 40 mEq (40 mEq Oral Given 06/29/18 1552)     Initial Impression / Assessment and Plan / ED Course  I have reviewed the triage vital signs and the nursing notes.  Pertinent labs & imaging results that were available during my care of the patient were reviewed by me and considered in my medical decision making (see chart for details).       Final Clinical Impressions(s) / ED Diagnoses   Final diagnoses:  Pre-op evaluation  Community acquired pneumonia of right lung, unspecified part of lung   Presenting for evaluation after being seen in neurosurgery office prior to arrival.  Patient being evaluated for lower back pain that has been worsening over the last several weeks after starting physical therapy.  Has had long history of foot drop but recently has had progressing weakness of the bilateral lower extremities and now is having bowel incontinence.  His neurosurgeon talked to her Wynetta Emery advised him to go to the ED for preop evaluation and admission for surgery.  1:21 PM Contacted Dr. Lonie Peak office to discuss his recommendations. He would like to patient to be admitted to hospitalist service for preop evaluation. He recommends labs including cbc, cmp, blood cultures, sed rate, crp, cxr.  He does have concern for possible systemic infection given patients quickly progressive symptoms.   Cbc leukocytosis.  Also anemic with hemoglobin of 10.5. CMP with hypokalemia at 3.1, repleted in ED.  BUN elevated at 48.  Creatinine elevated 1.4.  No prior labs to compare. Coags are normal. Blood cultures in process Flu testing, magnesium, sed rate are pending at the time of admission.  EKG shows questionable sinus rhythm with Left bundle branch block, pt does not have chest pain Chest x-ray shows right lower lobe pneumonia.  Patient treated with ceftriaxone and azithromycin.    4:05 PM Consult with Dr. Earlie Raveling with  internal medicine service who accepts patient for admission.   ED Discharge Orders    None       Rayne Du 06/29/18 1703    Raeford Razor, MD 06/30/18 1005

## 2018-06-30 LAB — PROTIME-INR
INR: 1.1 (ref 0.8–1.2)
Prothrombin Time: 13.6 seconds (ref 11.4–15.2)

## 2018-06-30 LAB — COMPREHENSIVE METABOLIC PANEL
ALT: 14 U/L (ref 0–44)
AST: 24 U/L (ref 15–41)
Albumin: 1.6 g/dL — ABNORMAL LOW (ref 3.5–5.0)
Alkaline Phosphatase: 105 U/L (ref 38–126)
Anion gap: 8 (ref 5–15)
BUN: 45 mg/dL — ABNORMAL HIGH (ref 8–23)
CO2: 26 mmol/L (ref 22–32)
Calcium: 7.6 mg/dL — ABNORMAL LOW (ref 8.9–10.3)
Chloride: 105 mmol/L (ref 98–111)
Creatinine, Ser: 1.36 mg/dL — ABNORMAL HIGH (ref 0.61–1.24)
GFR calc Af Amer: >60 mL/min (ref >60)
GFR calc non Af Amer: 55 mL/min — ABNORMAL LOW (ref >60)
Glucose, Bld: 78 mg/dL (ref 70–99)
Potassium: 3.7 mmol/L (ref 3.5–5.1)
Sodium: 139 mmol/L (ref 135–145)
Total Bilirubin: 0.4 mg/dL (ref 0.3–1.2)
Total Protein: 4.4 g/dL — ABNORMAL LOW (ref 6.5–8.1)

## 2018-06-30 LAB — CBC
HCT: 32.7 % — ABNORMAL LOW (ref 39.0–52.0)
Hemoglobin: 9.3 g/dL — ABNORMAL LOW (ref 13.0–17.0)
MCH: 24.6 pg — ABNORMAL LOW (ref 26.0–34.0)
MCHC: 28.4 g/dL — ABNORMAL LOW (ref 30.0–36.0)
MCV: 86.5 fL (ref 80.0–100.0)
Platelets: 196 10*3/uL (ref 150–400)
RBC: 3.78 MIL/uL — ABNORMAL LOW (ref 4.22–5.81)
RDW: 18.4 % — ABNORMAL HIGH (ref 11.5–15.5)
WBC: 12.1 10*3/uL — ABNORMAL HIGH (ref 4.0–10.5)
nRBC: 0 % (ref 0.0–0.2)

## 2018-06-30 LAB — DIGOXIN LEVEL: Digoxin Level: 1 ng/mL (ref 0.8–2.0)

## 2018-06-30 LAB — GLUCOSE, CAPILLARY: Glucose-Capillary: 259 mg/dL — ABNORMAL HIGH (ref 70–99)

## 2018-06-30 MED ORDER — INSULIN GLARGINE 100 UNIT/ML ~~LOC~~ SOLN
8.0000 [IU] | Freq: Every day | SUBCUTANEOUS | Status: DC
  Administered 2018-06-30 – 2018-07-03 (×4): 8 [IU] via SUBCUTANEOUS
  Filled 2018-06-30 (×4): qty 0.08

## 2018-06-30 MED ORDER — DICLOFENAC SODIUM 1 % TD GEL
2.0000 g | Freq: Four times a day (QID) | TRANSDERMAL | Status: DC | PRN
  Administered 2018-07-01 – 2018-07-11 (×6): 2 g via TOPICAL
  Filled 2018-06-30: qty 100

## 2018-06-30 MED ORDER — METOPROLOL SUCCINATE ER 50 MG PO TB24
50.0000 mg | ORAL_TABLET | Freq: Two times a day (BID) | ORAL | Status: DC
  Administered 2018-06-30: 50 mg via ORAL
  Filled 2018-06-30 (×2): qty 1

## 2018-06-30 MED ORDER — SODIUM CHLORIDE 0.9 % IV BOLUS
500.0000 mL | Freq: Once | INTRAVENOUS | Status: AC
  Administered 2018-06-30: 500 mL via INTRAVENOUS

## 2018-06-30 MED ORDER — FENTANYL CITRATE (PF) 100 MCG/2ML IJ SOLN: 12.5000 ug | INTRAMUSCULAR | Status: DC | PRN

## 2018-06-30 MED ORDER — ENSURE ENLIVE PO LIQD
237.0000 mL | Freq: Three times a day (TID) | ORAL | Status: DC
  Administered 2018-06-30 – 2018-07-12 (×29): 237 mL via ORAL

## 2018-06-30 MED ORDER — INSULIN ASPART 100 UNIT/ML ~~LOC~~ SOLN
2.0000 [IU] | Freq: Three times a day (TID) | SUBCUTANEOUS | Status: DC
  Administered 2018-06-30 – 2018-07-03 (×8): 2 [IU] via SUBCUTANEOUS

## 2018-06-30 MED ORDER — ADULT MULTIVITAMIN W/MINERALS CH
1.0000 | ORAL_TABLET | Freq: Every day | ORAL | Status: DC
  Administered 2018-06-30 – 2018-07-13 (×14): 1 via ORAL
  Filled 2018-06-30 (×14): qty 1

## 2018-06-30 MED ORDER — FENTANYL CITRATE (PF) 100 MCG/2ML IJ SOLN
12.5000 ug | INTRAMUSCULAR | Status: DC | PRN
  Administered 2018-06-30 – 2018-07-01 (×9): 12.5 ug via INTRAVENOUS
  Filled 2018-06-30 (×10): qty 2

## 2018-06-30 NOTE — Progress Notes (Signed)
Initial Nutrition Assessment  DOCUMENTATION CODES:   Non-severe (moderate) malnutrition in context of chronic illness  INTERVENTION:   -Ensure Enlive po TID, each supplement provides 350 kcal and 20 grams of protein -MVI with minerals daily  NUTRITION DIAGNOSIS:   Moderate Malnutrition related to chronic illness(COPD, CHF, DM) as evidenced by energy intake < 75% for > or equal to 1 month, mild fat depletion, moderate fat depletion, mild muscle depletion, moderate muscle depletion.  GOAL:   Patient will meet greater than or equal to 90% of their needs  MONITOR:   PO intake, Supplement acceptance, Labs, Weight trends, Skin, I & O's  REASON FOR ASSESSMENT:   Malnutrition Screening Tool    ASSESSMENT:   63 y.o. male with PMHx of  COPD, CHF, T2DM, Afib, pace maker, lumbar disc disease s/p fusion wo scented with new pneumonia as well as worsening neurological symptoms consistent with worsening spinal stenosis.   Pt admitted with CAP and worsening spinal stenosis.   Reviewed I/O's: +942 ml x 24 hours  Per neurosurgery notes, plan for decompressive laminectomy and L3-S1 fusion in approximately 5-7 days (unable to perform surgery until pneumonia has improved).   Spoke with pt at bedside, who reports that he generally has a good appetite, but has limited his intake over the past year secondary to decreased mobility and "just getting old". PTA he was consuming 3 meals per day (Breakfast: [employer] sausage biscuit OR Ensure shake OR Premier Protein shake; Lunch: meat, starch, and vegetable from a family restaurant; Dinner: salad or sandwich). Pt reports that his appetite progressively decreases throughout the day.   Pt reports he consumed less than half of his breakfast this morning. This was confirmed by RN, who also shared that pt experienced a hypoglycemic event this morning due to inadequate intake. Pt consumed 100% of a chocolate Ensure supplement.   Pt endorses wt loss over the  past year. His UBW is around 290-300#. He estimates he has lost 90-100# over the past year, however, no wt encounters available to confirm this statement.   Pt shares that limited mobility has been a large issue with him related to back trouble. At baseline, he is active, however, ambulation has been increasing difficulty, sharing he has not been able to walk for the past 2 weeks.   Discussed with pt importance of good meal and supplement intake to promote healing. Pt amenable to Ensure Enlive; encouraged pt to continue to choose supplement high in both protein in calories to help optimize nutritional status. Pt with poor oral intake and would benefit from nutrient dense supplement. One Ensure Enlive supplement provides 350 kcals, 20 grams protein, and 44-45 grams of carbohydrate vs one Glucerna shake supplement, which provides 220 kcals, 10 grams of protein, and 26 grams of carbohydrate. Given pt's hx of DM, RD will continue to monitor PO intake, CBGS, and adjust supplement regimen as appropriate.   No results found for: HGBA1C PTA DM medications are 25-30 units insulin detemir q HS.   Labs reviewed: CBGS: 116-178 (inpatient orders for glycemic control are 0-5 units insulin aspart q HS, 0-9 units insulin aspart TID with meals, 2 units inslin aspart TID with meals and 8 units insulin glargine q HS).   NUTRITION - FOCUSED PHYSICAL EXAM:    Most Recent Value  Orbital Region  Mild depletion  Upper Arm Region  Moderate depletion  Thoracic and Lumbar Region  Mild depletion  Buccal Region  Mild depletion  Temple Region  Moderate depletion  Clavicle Bone  Region  Moderate depletion  Clavicle and Acromion Bone Region  Moderate depletion  Scapular Bone Region  Moderate depletion  Dorsal Hand  Moderate depletion  Patellar Region  Mild depletion  Anterior Thigh Region  Mild depletion  Posterior Calf Region  Mild depletion  Edema (RD Assessment)  Mild  Hair  Reviewed  Eyes  Reviewed  Mouth  Reviewed   Skin  Reviewed  Nails  Reviewed       Diet Order:   Diet Order            Diet heart healthy/carb modified Room service appropriate? Yes; Fluid consistency: Thin  Diet effective now              EDUCATION NEEDS:   Education needs have been addressed  Skin:  Skin Assessment: Skin Integrity Issues: Skin Integrity Issues:: Diabetic Ulcer Diabetic Ulcer: lt heel  Last BM:  06/30/18  Height:   Ht Readings from Last 1 Encounters:  06/29/18 6\' 2"  (1.88 m)    Weight:   Wt Readings from Last 1 Encounters:  06/29/18 92.5 kg    Ideal Body Weight:  86.4 kg  BMI:  Body mass index is 26.19 kg/m.  Estimated Nutritional Needs:   Kcal:  2300-2500  Protein:  120-135 grams  Fluid:  2.3-2.5 L    Henry Mcpherson A. Henry Mcpherson, RD, LDN, CDE Pager: [phone] After hours Pager: [phone]

## 2018-06-30 NOTE — Progress Notes (Signed)
PHARMACY - PHYSICIAN COMMUNICATION CRITICAL VALUE ALERT - BLOOD CULTURE IDENTIFICATION (BCID)  Henry Mcpherson is an 63 y.o. male who presented to [facility] on 06/29/2018 with a chief complaint of spinal stenosis and PNA  Assessment:  Blood cultures show GPR 1/4 (likely contaminant ?;  coynebacterium )  Name of physician (or Provider) Contacted: No therapy changes are needed  Current antibiotics: azithromycin and ceftriaxone  Changes to prescribed antibiotics recommended:  -No changes recommended  Harland German, PharmD Clinical Pharmacist **Pharmacist phone directory can now be found on [email or web address] ([email or web address]).  Listed under [facility].

## 2018-06-30 NOTE — Care Management Note (Addendum)
Case Management Note  Patient Details  Name: Keson Crew MRN: [id number] Date of Birth: 09-04-1955  Subjective/Objective:   From home with wife, CAP, bp is low ,getting boluses, also blood sugars were low (67) per RN in progression.  Patient was going to have surgery on his back but now that is on hold. He is on iv abx, He states he has PCP in [place] and he has [employer], he will have wife to bring insurance information in.  3/2 Letha Cape RN, BSN - NCM asked patient when wife would be bringing his insurance information he states tomorrow, her brother passed away and she is at the funeral today.     PCP- Dr. Merideth Abbey DME- has walker,cane, and w/chair at home Pharmacy - [facility] on [address],  Meds- no problem getting meds. Transportation- No problem with transportation.                    Action/Plan: NCM will follow for transition of care needs.   Expected Discharge Date:                  Expected Discharge Plan:  Home w Home Health Services  In-House Referral:     Discharge planning Services  CM Consult  Post Acute Care Choice:    Choice offered to:     DME Arranged:    DME Agency:     HH Arranged:    HH Agency:     Status of Service:  In process, will continue to follow  If discussed at Long Length of Stay Meetings, dates discussed:    Additional Comments:  Leone Haven, RN 06/30/2018, 12:18 PM

## 2018-06-30 NOTE — Progress Notes (Signed)
Subjective: Mr. Ravelo is doing better today. Does not have any complaint. We told him he had low blood pressure and low sugar this AM, but he denies any dizziness, headache or other symptoms with it.   Objective:  Vital signs in last 24 hours: Vitals:   06/29/18 1754 06/29/18 2214 06/29/18 2216 06/29/18 2327  BP: 98/80 (!) 93/58 (!) 94/57 96/64  Pulse: 60  60 62  Resp: 19   18  Temp: 98.1 F (36.7 C)   (!) 97.4 F (36.3 C)  TempSrc: Oral   Oral  SpO2: 92%   100%  Weight:      Height:       Physical Exam  Constitutional: Does not appear ill today. No distress.  HENT:  Head: Normocephalic and atraumatic.  Eyes: Conjunctivae are normal.  Cardiovascular: Bradycardic. No murmur  Respiratory: Effort normal and breath sounds normal. No respiratory distress. Has some rales at middle and bases. (Improved) GI: Soft. Bowel sounds are normal. No distension. There is no tenderness.  Musculoskeletal: No edema. Feet are warmer today. Pulses are weak (improved). 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 Has 1+ bilateral pitting edema.  Neurological: Is alert, oriented x3. LE motor 3/5. Sensory decreased but symmetric. Skin: Not diaphoretic. No erythema. Mildly pale Psychiatric: Normal mood and affect. Behavior is normal. Judgment and thought content normal.    Assessment/Plan:  Active Problems:   Community acquired pneumonia He is a 63 year old male with past medical history of lumbar disc disease status post multilevel decompressive laminectomy 13 years ago, presented with worsening of low back pain, developing sensory and motor deficit and, fecal incontinency. Referred to ED from neurosurgery office due to looking cachectic. He found to have PNA on admission.  Cauda equina syndrome: likely due to worsening of spinal canal stenosis. He was seen in neurosurgery office 2 weeks ago, where CT myelogram ordered.  Patient with worsening of terms and inability to walk, was seen in the office again  yesterday for follow up. Per Dr. 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 Surgery recommended, in adition, patient looked ill and cachectic and sent to ED for work up and evaluation of any systemic infection. Planed to do surgery in few days  -Switch Oxy to fentanyl due to low BP -500 ml NS --Neurosurgery consulted, holding surgery, will wait for 5-7 days to give IV treatment for PNA before surgery. -Tylenol 1 g every 6 hours PRN for mild pain -Voltaren gel PRN -Zofran 4 mg p.o. every 6 hours PRN (be causious with boredrline QT) -Cardiac monitoring -CBC QD -CMP QD -f/u HIV antibody -INR-->1  Pneumonia (CAP): CXR with right lower lobe opacity. Had leukocytosis at 14.1. Flu test was negative. Patient with some clinical improvement today, Afebrile, leukocytosis improved to 12.1. Will continue IV Ceftriaxone and Azithro for 5-7 days. Does not seem to be COPD exacerbation.   -Continue IV Ciftriaxon and Azithromycin (started 2/27) -CBC daily -DuoNeb nebulizer every 6 hours PRN -f/u blood culture  Hypotension at 73/53 (repeated within 15 min and remained low) and bradycardi at 53.   He is asymptomatic.  Patient says he usually has soft BP s. Likely secondary to Oxycodone that he got today. 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   -Switch oxycodone to fentanyl 12.5 mg  -Holding digoxin today and reevaluate  -Decrease metoprolol to 50 mg twice  daily (from 100 BID) -500 ml NS bolus -Continue cardiac monitoring   CAD status post CABG CHF (no further information on chart) Afib Volume status is okay except some bilateral lower extremity edema.  Is on Digoxin and metoprolol at home. No RVR (currently pulse rate around 60s)  -Digoxin level was normal -Continue Lipitor 40 mg daily -Holding Digoxin  0.125 mg p.o. Qd today  -Decresaing Metoprolol succinate to 50 mg BID due to bradycardia and hypotension -BMP daily  Insulin dependent type 2 DM: Had asymptomatic hypoglycemia at 67 this AM. Came up to 102 after orange juice.  -Decreasing NovoLog from 3 units subcu TID to 2 Units TID -Decreasing Lantus from 12 units subcu at bedtime to 8 unit at bedtime -Sensitive SSI -CBG monitoring   Anemia: Patient looks pale on exam. But looks better today. Per family, this has been due to gi ulcer. No previous lab test on chart. CBC at ED with normocytic anemia with Hb 10. Can be anemia of chronic disease.  Hb at 9.5 today. No evidence of blood loss. Will recheck tomorrow.  -Monitor CBC -Although normocytic, can still be IDA. Will check Ferritin after acute illness improves. -Protonix -Monitor Hb and symptoms as he is on Levonox  Diet: HH/CM  IV fluid: 500 ml Bolus VTE ppx: Lovenox Code status: Full  Dispo: Anticipated discharge in approximately 4-5 days (As schedules for surgery)  Chevis Pretty, MD 06/30/2018, 6:21 AM Pager: [phone]

## 2018-06-30 NOTE — Progress Notes (Signed)
Subjective: Patient reports improved pain no change in lower weakness  Objective: Vital signs in last 24 hours: Temp:  [97.4 F (36.3 C)-98.5 F (36.9 C)] 97.7 F (36.5 C) (02/28 0733) Pulse Rate:  [58-66] 64 (02/28 0733) Resp:  [14-19] 18 (02/28 0733) BP: (92-104)/(55-80) 92/64 (02/28 0733) SpO2:  [92 %-100 %] 100 % (02/27 2327) Weight:  [92.5 kg] 92.5 kg (02/27 1243)  Intake/Output from previous day: 02/27 0701 - 02/28 0700 In: 1066.8 [P.O.:240; IV Piggyback:826.8] Out: 125 [Urine:125] Intake/Output this shift: Total I/O In: 240 [P.O.:240] Out: -   4-4 minus out of 5 lower extremities proximally quads iliopsoas bilateral foot drops 0-5 on the right when out of 5 in the left  Lab Results: Recent Labs    06/29/18 1459 06/30/18 0316  WBC 14.9* 12.1*  HGB 10.5* 9.3*  HCT 36.3* 32.7*  PLT 209 196   BMET Recent Labs    06/29/18 1459 06/30/18 0316  NA 137 139  K 3.1* 3.7  CL 104 105  CO2 24 26  GLUCOSE 157* 78  BUN 48* 45*  CREATININE 1.40* 1.36*  CALCIUM 7.7* 7.6*    Studies/Results: Dg Chest 2 View  Result Date: 06/29/2018 CLINICAL DATA:  Cough for 1 week. EXAM: CHEST - 2 VIEW COMPARISON:  None. FINDINGS: There is airspace disease in the right lower lobe most consistent with pneumonia. Left lung is clear. Heart size is mildly enlarged. The patient is status post CABG with a pacing device in place. No pneumothorax or pleural fluid. No acute or focal bony abnormality. IMPRESSION: Right lower lobe airspace disease most consistent with pneumonia. Electronically Signed   By: Drusilla Kanner M.D.   On: 06/29/2018 15:03    Assessment/Plan: Hospital day 1 for admission workup and preoperative clearance for a decompressive laminectomy and fusion L3-S1. Initial admitting chest x-ray shows pneumonia so this will need to be addressed adequately prior to surgical decompression fusion. Certainly we want to be able to perform surgery soon as we can safely however will not be  able to do it tomorrow with a recent diagnosis of pneumonia. I imagine we'll need to get in 5-7 days on antibiotics prior to scheduling so he may have to look at early to mid next week.  LOS: 1 day     Merissa Renwick P 06/30/2018, 8:38 AM

## 2018-07-01 DIAGNOSIS — I872 Venous insufficiency (chronic) (peripheral): Secondary | ICD-10-CM

## 2018-07-01 DIAGNOSIS — E440 Moderate protein-calorie malnutrition: Secondary | ICD-10-CM

## 2018-07-01 DIAGNOSIS — J189 Pneumonia, unspecified organism: Secondary | ICD-10-CM

## 2018-07-01 LAB — CBC
HCT: 30.4 % — ABNORMAL LOW (ref 39.0–52.0)
Hemoglobin: 8.6 g/dL — ABNORMAL LOW (ref 13.0–17.0)
MCH: 24.5 pg — ABNORMAL LOW (ref 26.0–34.0)
MCHC: 28.3 g/dL — AB (ref 30.0–36.0)
MCV: 86.6 fL (ref 80.0–100.0)
Platelets: 210 10*3/uL (ref 150–400)
RBC: 3.51 MIL/uL — ABNORMAL LOW (ref 4.22–5.81)
RDW: 18.6 % — ABNORMAL HIGH (ref 11.5–15.5)
WBC: 13.5 10*3/uL — ABNORMAL HIGH (ref 4.0–10.5)
nRBC: 0.1 % (ref 0.0–0.2)

## 2018-07-01 LAB — BASIC METABOLIC PANEL
Anion gap: 8 (ref 5–15)
BUN: 37 mg/dL — ABNORMAL HIGH (ref 8–23)
CO2: 23 mmol/L (ref 22–32)
Calcium: 7.6 mg/dL — ABNORMAL LOW (ref 8.9–10.3)
Chloride: 106 mmol/L (ref 98–111)
Creatinine, Ser: 0.98 mg/dL (ref 0.61–1.24)
GFR calc Af Amer: >60 mL/min (ref >60)
Glucose, Bld: 121 mg/dL — ABNORMAL HIGH (ref 70–99)
Potassium: 3.7 mmol/L (ref 3.5–5.1)
Sodium: 137 mmol/L (ref 135–145)

## 2018-07-01 LAB — GLUCOSE, CAPILLARY
Glucose-Capillary: 134 mg/dL — ABNORMAL HIGH (ref 70–99)
Glucose-Capillary: 161 mg/dL — ABNORMAL HIGH (ref 70–99)
Glucose-Capillary: 166 mg/dL — ABNORMAL HIGH (ref 70–99)

## 2018-07-01 MED ORDER — LIDOCAINE 5 % EX PTCH
1.0000 | MEDICATED_PATCH | CUTANEOUS | Status: DC
  Administered 2018-07-01 – 2018-07-13 (×11): 1 via TRANSDERMAL
  Filled 2018-07-01 (×12): qty 1

## 2018-07-01 MED ORDER — HYDROMORPHONE HCL 1 MG/ML IJ SOLN
0.5000 mg | INTRAMUSCULAR | Status: DC | PRN
  Administered 2018-07-01 – 2018-07-05 (×16): 0.5 mg via INTRAVENOUS
  Filled 2018-07-01 (×16): qty 1

## 2018-07-01 MED ORDER — OXYCODONE HCL 5 MG PO TABS
5.0000 mg | ORAL_TABLET | Freq: Four times a day (QID) | ORAL | Status: DC | PRN
  Administered 2018-07-01 – 2018-07-05 (×14): 5 mg via ORAL
  Filled 2018-07-01 (×15): qty 1

## 2018-07-01 MED ORDER — KETOROLAC TROMETHAMINE 30 MG/ML IJ SOLN
30.0000 mg | Freq: Four times a day (QID) | INTRAMUSCULAR | Status: AC | PRN
  Administered 2018-07-01 – 2018-07-05 (×13): 30 mg via INTRAVENOUS
  Filled 2018-07-01 (×13): qty 1

## 2018-07-01 MED ORDER — FENTANYL CITRATE (PF) 100 MCG/2ML IJ SOLN
50.0000 ug | Freq: Once | INTRAMUSCULAR | Status: AC
  Administered 2018-07-01: 50 ug via INTRAVENOUS

## 2018-07-01 MED ORDER — SODIUM CHLORIDE 0.9 % IV BOLUS
1000.0000 mL | Freq: Once | INTRAVENOUS | Status: AC
  Administered 2018-07-01: 1000 mL via INTRAVENOUS

## 2018-07-01 NOTE — Progress Notes (Addendum)
   Subjective: Laying flat in bed, complaining of significant pain. Unrelieved with fentanyl.   Objective:  Vital signs in last 24 hours: Vitals:   06/30/18 1332 06/30/18 1511 06/30/18 1708 06/30/18 2300  BP: 133/74  (!) 121/102 96/65  Pulse: 70 72 60 60  Resp: 16 16 18 18   Temp: 98.3 F (36.8 C)  98.4 F (36.9 C) (!) 97.4 F (36.3 C)  TempSrc: Oral  Oral Oral  SpO2: 100% 100%  90%  Weight:      Height:       Physical Exam Constitutional: NAD, appears comfortable Cardiovascular: RRR, no murmurs, rubs, or gallops.  Pulmonary/Chest: CTAB, no wheezes, rales, or rhonchi.  Abdominal: Soft, non tender, non distended. +BS.  Extremities: Warm and well perfused. Bilateral venous stasis dermatitis, multiple healing wounds Neurological: Sensation to light touch intact, exam limited due to pain, moving both lower extremities spontaneously    Assessment/Plan:  63 yo M with pmhx of CHF, chronic a-fib, type II DM, lumbar stenosis who was sent to the ED by his neurosurgeon for worsening low back pain with associated lower extremity weakness, numbness, and fecal incontinence. Planning for surgery, but pre operatively found to have PNA.    Cauda Equina Syndrome: History of lumbar stenosis with prior lumbar fusion done approximately 15 years ago. Presented with progressive sxs, CT myelogram showed critical stenosis with concern for possible infection. Pain uncontrolled.  -- Neurosurgery following, appreciate recommendations  -- Surgery delayed due to diagnosis of PNA -- Stop fentanyl  -- Toradol 30 q6h prn -- Oxycodone & tylenol 5 mg q6h prn  -- Dilaudid 0.5 mg q4h breakthrough pain   Hypotension: In the setting of CAP, poor po intake -- 1L NS bolud -- Hold metoprolol -- Hold parameters for opioids: Hold & Call MD if SBP<90, HR<65, RR<10, O2<90, or altered mental status  Gram Positive Rod Bacteremia: 1 of 2 blood cultures, likely contaminant  -- Follow up speciation   CAP: Symptomatic  with cough, found to have leukocytosis and RLL infiltrate on CXR. Flu PCR negative.  -- Day 3 of CTX and azithromycin  -- Duonebs prn   Hx A-fib CAD s/p CABG CHF  -- Holding metoprolol with hypotension  -- Continue digoxin  -- I&Os  IDDM: Hypoglycemia yesterday with decreased po intake. Regimen decreased. Improved today. -- Novolog 2 units TID AC -- Lantus 8 QHS -- SSI-sensitive TID AC & QHS  Acute on Chronic Normocytic Anemia: No clinical bleeding. Down 8.6 from 10.5 on admission. History of GI ulcer per report - no records.  -- FOBT -- Daily CBC; goal hgb > 7.0  FEN: 1L NS bolus, replete lytes prn, HH / Carb mod VTE ppx: Lovenox  Code Status: FULL   Dispo: Anticipated discharge pending treatment for above infections and neurosurgery plans.   Reymundo Poll, MD 07/01/2018, 6:54 AM Pager: [phone]

## 2018-07-01 NOTE — Plan of Care (Signed)
  Problem: Education: Goal: Knowledge of General Education information will improve Description Including pain rating scale, medication(s)/side effects and non-pharmacologic comfort measures Outcome: Progressing   Problem: Clinical Measurements: Goal: Ability to maintain clinical measurements within normal limits will improve Outcome: Progressing   Problem: Clinical Measurements: Goal: Cardiovascular complication will be avoided Outcome: Progressing   Problem: Pain Managment: Goal: General experience of comfort will improve Outcome: Not Progressing

## 2018-07-02 DIAGNOSIS — R7881 Bacteremia: Secondary | ICD-10-CM

## 2018-07-02 DIAGNOSIS — Z8719 Personal history of other diseases of the digestive system: Secondary | ICD-10-CM

## 2018-07-02 LAB — CULTURE, BLOOD (ROUTINE X 2): Special Requests: ADEQUATE

## 2018-07-02 LAB — BASIC METABOLIC PANEL
Anion gap: 7 (ref 5–15)
BUN: 38 mg/dL — ABNORMAL HIGH (ref 8–23)
CO2: 23 mmol/L (ref 22–32)
Calcium: 7.3 mg/dL — ABNORMAL LOW (ref 8.9–10.3)
Chloride: 108 mmol/L (ref 98–111)
Creatinine, Ser: 1.13 mg/dL (ref 0.61–1.24)
GFR calc Af Amer: >60 mL/min (ref >60)
GFR calc non Af Amer: >60 mL/min (ref >60)
Glucose, Bld: 98 mg/dL (ref 70–99)
Potassium: 3.9 mmol/L (ref 3.5–5.1)
Sodium: 138 mmol/L (ref 135–145)

## 2018-07-02 LAB — CBC
HCT: 29.3 % — ABNORMAL LOW (ref 39.0–52.0)
Hemoglobin: 8.4 g/dL — ABNORMAL LOW (ref 13.0–17.0)
MCH: 25 pg — ABNORMAL LOW (ref 26.0–34.0)
MCHC: 28.7 g/dL — ABNORMAL LOW (ref 30.0–36.0)
MCV: 87.2 fL (ref 80.0–100.0)
Platelets: 185 10*3/uL (ref 150–400)
RBC: 3.36 MIL/uL — ABNORMAL LOW (ref 4.22–5.81)
RDW: 18.7 % — ABNORMAL HIGH (ref 11.5–15.5)
WBC: 10 10*3/uL (ref 4.0–10.5)
nRBC: 0 % (ref 0.0–0.2)

## 2018-07-02 LAB — GLUCOSE, CAPILLARY
GLUCOSE-CAPILLARY: 93 mg/dL (ref 70–99)
Glucose-Capillary: 67 mg/dL — ABNORMAL LOW (ref 70–99)
Glucose-Capillary: 178 mg/dL — ABNORMAL HIGH (ref 70–99)
Glucose-Capillary: 250 mg/dL — ABNORMAL HIGH (ref 70–99)

## 2018-07-02 MED ORDER — AZITHROMYCIN 250 MG PO TABS
500.0000 mg | ORAL_TABLET | Freq: Every day | ORAL | Status: AC
  Administered 2018-07-02 – 2018-07-03 (×2): 500 mg via ORAL
  Filled 2018-07-02 (×2): qty 2

## 2018-07-02 MED ORDER — IPRATROPIUM-ALBUTEROL 0.5-2.5 (3) MG/3ML IN SOLN
3.0000 mL | Freq: Three times a day (TID) | RESPIRATORY_TRACT | Status: DC
  Administered 2018-07-02 – 2018-07-03 (×3): 3 mL via RESPIRATORY_TRACT
  Filled 2018-07-02 (×3): qty 3

## 2018-07-02 MED ORDER — IPRATROPIUM-ALBUTEROL 0.5-2.5 (3) MG/3ML IN SOLN
3.0000 mL | Freq: Four times a day (QID) | RESPIRATORY_TRACT | Status: DC
  Administered 2018-07-02: 3 mL via RESPIRATORY_TRACT
  Filled 2018-07-02: qty 3

## 2018-07-02 NOTE — Progress Notes (Signed)
   Subjective: Henry Mcpherson was seen and evaluated this morning. No acute event overnight. He was coughing frequently when we saw him today but does not have sob. He reported  back pain and asks for his PRN pain med. He has no other complaint.   Objective:  Vital signs in last 24 hours: Vitals:   06/30/18 2300 07/01/18 0825 07/01/18 1730 07/01/18 2344  BP: 96/65 (!) 90/57 95/60 94/65   Pulse: 60 92 96 98  Resp: 18 17 17 20   Temp: (!) 97.4 F (36.3 C) 97.9 F (36.6 C) 97.8 F (36.6 C) 98.2 F (36.8 C)  TempSrc: Oral Oral Oral Oral  SpO2: 90% 96% 100% 96%  Weight:      Height:       Vital signs reviewed General: Pleasant gentleman, NAD HEENT: EYES: EOM nl CVS: RRR, Normal S1S2, no murmur Lungs: Rhonchi on ant and lat lung exam.  Abdomen: Soft, nontender, nondistended, normoactive bowel sounds Extremities: 1-2+ bilateral lower extremity edema  Assessment/Plan:  Active Problems:   Community acquired pneumonia   Malnutrition of moderate degree  He is a 63 year old male with past medical history of lumbar disc disease status post multilevel decompressive laminectomy 13 years ago, sent to ED from his neurosurgeon office for worsening of low back pain, developing sensory and motor deficit and, fecal incontinency.Planning to do surgery, how ever found to have PNA on admission.    Cauda Equina Syndrome: History of lumbar stenosis with prior lumbar fusion done approximately 15 years ago. Presented with progressive sxs, CT myelogram showed critical stenosis with concern for possible infection. Pain uncontrolled.  -Plan is surgery after finishing IV Ab for PNA. Neurosurgery is following, appreciate recommendations. -Continue Toradol 30 q6h prn -Continue Oxycodone & tylenol 5 mg q6h prn  -Continue Dilaudid 0.5 mg q4h breakthrough pain    CAP:  Symptomatic with cough, found to have leukocytosis and RLL infiltrate on CXR. Flu PCR negative.   air. Is on day 4 of IV Ceftriaxone and  azithromycin. Had persistant productive cough during this AM encounter. O2sat nl at room. WBC normalized to 10,000 today.  -Continue IV Ceftriaxone and Azithromycin -Change PRN Duoneb to schedule for q6h  Gram Positive Rod Bacteremia: 1 of 2 blood cultures, likely contaminant  -Follow up speciation   Hypotension: In the setting of CAP, poor po intake. Got NS bolus yesterday and BP has remained around 90s/60s. And HR 96. Patient is asymptomatic. Will monitoring, holding Metoprolol and holding parameters when getting opioids for pain.  -Hold metoprolol -Hold parameters for opioids: Hold & Call MD if SBP<90, HR<65, RR<10, O2<90, or altered mental status  Hx A-fib CAD s/p CABG CHF   Asymptomatic. Nl heart rate today - Holding metoprolol with hypotension  - Continue digoxin  - I&Os -Continue cardiac monitoring  Insulin dependent type II DM: With hypoglycemia due to decreased po intake during this hospitalization.  Diabetic regimen decreased. No further hypoglycemia. Glucose 98 today.  - Continue Novolog 2 units TID  - Continue Lantus 8 QHS - Continue sensitive SSI  TID  - Continue CBG monitoring  Acute on Chronic Normocytic Anemia:  History of GI ulcer per report (no medical records)No clinical bleeding. Down 8.4 from 10.5 on admission. Hb 8.4 today. No evidence of bleeding. FOBT is pending. Will monitor.  - CBC Daily. Transfusion goal hgb > 7.0 -F/u FOBT result  Dispo: Anticipated discharge in approximately 3-4 days  Henry Pretty, MD 07/02/2018, 6:37 AM Pager: [phone]

## 2018-07-02 NOTE — Progress Notes (Signed)
NEUROSURGERY PROGRESS NOTE  Doing ok. Complains of back pain that is controlled with pain medication.   Temp:  [97.5 F (36.4 C)-98.2 F (36.8 C)] 97.5 F (36.4 C) (03/01 0745) Pulse Rate:  [93-98] 93 (03/01 0745) Resp:  [16-20] 16 (03/01 0745) BP: (94-117)/(60-84) 117/84 (03/01 0745) SpO2:  [95 %-100 %] 95 % (03/01 0745)  Plan: Plan for surgery later this week pending PNA status.   Sherryl Manges, NP 07/02/2018 8:46 AM

## 2018-07-03 DIAGNOSIS — M48061 Spinal stenosis, lumbar region without neurogenic claudication: Secondary | ICD-10-CM

## 2018-07-03 DIAGNOSIS — G834 Cauda equina syndrome: Secondary | ICD-10-CM | POA: Diagnosis present

## 2018-07-03 LAB — COMPREHENSIVE METABOLIC PANEL
ALT: 79 U/L — ABNORMAL HIGH (ref 0–44)
AST: 116 U/L — ABNORMAL HIGH (ref 15–41)
Albumin: 1.4 g/dL — ABNORMAL LOW (ref 3.5–5.0)
Alkaline Phosphatase: 123 U/L (ref 38–126)
Anion gap: 5 (ref 5–15)
BUN: 39 mg/dL — ABNORMAL HIGH (ref 8–23)
CO2: 25 mmol/L (ref 22–32)
Calcium: 7.6 mg/dL — ABNORMAL LOW (ref 8.9–10.3)
Chloride: 107 mmol/L (ref 98–111)
Creatinine, Ser: 0.96 mg/dL (ref 0.61–1.24)
GFR calc Af Amer: >60 mL/min (ref >60)
GFR calc non Af Amer: >60 mL/min (ref >60)
Glucose, Bld: 236 mg/dL — ABNORMAL HIGH (ref 70–99)
POTASSIUM: 4.3 mmol/L (ref 3.5–5.1)
Sodium: 137 mmol/L (ref 135–145)
Total Bilirubin: 0.2 mg/dL — ABNORMAL LOW (ref 0.3–1.2)
Total Protein: 4.2 g/dL — ABNORMAL LOW (ref 6.5–8.1)

## 2018-07-03 LAB — CBC
HCT: 27.6 % — ABNORMAL LOW (ref 39.0–52.0)
Hemoglobin: 7.9 g/dL — ABNORMAL LOW (ref 13.0–17.0)
MCH: 24.7 pg — AB (ref 26.0–34.0)
MCHC: 28.6 g/dL — AB (ref 30.0–36.0)
MCV: 86.3 fL (ref 80.0–100.0)
Platelets: 166 10*3/uL (ref 150–400)
RBC: 3.2 MIL/uL — ABNORMAL LOW (ref 4.22–5.81)
RDW: 18.8 % — AB (ref 11.5–15.5)
WBC: 8.3 10*3/uL (ref 4.0–10.5)
nRBC: 0 % (ref 0.0–0.2)

## 2018-07-03 LAB — GLUCOSE, CAPILLARY
GLUCOSE-CAPILLARY: 144 mg/dL — AB (ref 70–99)
Glucose-Capillary: 93 mg/dL (ref 70–99)
Glucose-Capillary: 178 mg/dL — ABNORMAL HIGH (ref 70–99)
Glucose-Capillary: 125 mg/dL — ABNORMAL HIGH (ref 70–99)

## 2018-07-03 MED ORDER — SODIUM CHLORIDE 0.9 % IV BOLUS: 1000.0000 mL | Freq: Once | INTRAVENOUS | Status: DC

## 2018-07-03 MED ORDER — IPRATROPIUM-ALBUTEROL 0.5-2.5 (3) MG/3ML IN SOLN
3.0000 mL | Freq: Two times a day (BID) | RESPIRATORY_TRACT | Status: DC
  Administered 2018-07-03 – 2018-07-04 (×2): 3 mL via RESPIRATORY_TRACT
  Filled 2018-07-03 (×2): qty 3

## 2018-07-03 NOTE — Progress Notes (Signed)
Subjective: Patient reports still a moderate amount of back pain with no change in the strength of his lower extremties. Still unable to sit up in bed much because of the pain.   Objective: Vital signs in last 24 hours: Temp:  [97.8 F (36.6 C)-98.2 F (36.8 C)] 97.8 F (36.6 C) (03/02 0725) Pulse Rate:  [100-101] 100 (03/02 0725) Resp:  [16-18] 17 (03/02 0725) BP: (93-127)/(54-71) 127/71 (03/02 0725) SpO2:  [97 %-99 %] 97 % (03/02 0840) FiO2 (%):  [18 %] 18 % (03/01 2327) Weight:  [94.5 kg] 94.5 kg (03/02 0621)  Intake/Output from previous day: 03/01 0701 - 03/02 0700 In: 120 [P.O.:120] Out: 700 [Urine:700] Intake/Output this shift: No intake/output data recorded.    Lab Results: Lab Results  Component Value Date   WBC 8.3 07/03/2018   HGB 7.9 (L) 07/03/2018   HCT 27.6 (L) 07/03/2018   MCV 86.3 07/03/2018   PLT 166 07/03/2018   Lab Results  Component Value Date   INR 1.1 06/30/2018   BMET Lab Results  Component Value Date   NA 137 07/03/2018   K 4.3 07/03/2018   CL 107 07/03/2018   CO2 25 07/03/2018   GLUCOSE 236 (H) 07/03/2018   BUN 39 (H) 07/03/2018   CREATININE 0.96 07/03/2018   CALCIUM 7.6 (L) 07/03/2018    Studies/Results: No results found.  Assessment/Plan: Doing ok, planning for posterior lateral fusion on Wednesday.   LOS: 4 days    Tiana Loft Patrica Mendell 07/03/2018, 10:35 AM

## 2018-07-03 NOTE — Progress Notes (Signed)
Subjective: Patient was seen and evaluated at bedside on morning rounds. No acute events overnight. He feels better regarding the cough. Has no shortness of breath.  But mentions that it seems the pain medications do not work for his back pain as it used to work 5 days.   We discussed about plan of care for finishing antibiotic course before doing back surgery.  He agrees with the plan.    Objective:  Vital signs in last 24 hours: Vitals:   07/02/18 0845 07/02/18 1331 07/02/18 1711 07/02/18 2327  BP:   93/66 (!) 97/54  Pulse:   (!) 101 (!) 101  Resp:   16 18  Temp:   97.9 F (36.6 C) 98.2 F (36.8 C)  TempSrc:   Oral Oral  SpO2: 93% 97% 99% 97%  Weight:      Height:       Physical Exam Vitals signs reviewed.  Constitutional:      Appearance: Normal appearance.  HENT:     Head: Normocephalic and atraumatic.  Eyes:     Extraocular Movements: Extraocular movements intact.  Cardiovascular:     Rate and Rhythm: Normal rate and regular rhythm.     Heart sounds: No murmur.  Pulmonary:     Effort: Pulmonary effort is normal.     Comments: Mild wheezing and rhonchi (improved com pairing to yesterday) Abdominal:     General: There is distension.     Palpations: Abdomen is soft.     Tenderness: There is no abdominal tenderness.  Neurological:     Mental Status: He is alert and oriented to person, place, and time.     Assessment/Plan:  Active Problems:   Community acquired pneumonia   Malnutrition of moderate degree  63 year old male with PMHx of lumbar disc disease status post multilevel decompressive laminectomy 13 years ago, sent to ED from his neurosurgeon office for worsening of low back pain, developing sensorimotor deficit, fecal incontinency.  He found to have pneumonia on admission.   PNA: COPD: Patient presented with cough and leukocytosis, and found to have right lower lobe opacity on chest x-ray consistent with pneumonia.  Treated with IV ceftriaxone and  azithromycin.  Is given IV ceftriaxone and azithromycin started on 5/27. Was on DuoNeb as needed that switch to every 6 hours(scheduled) DuoNeb yesterday. Having improved and patient feels much better, has no shortness of breath.  Has been afebrile. physical exam improving today and leukocytosis normalized to 8.3.  -Will stop AB after getting full 5 days (today PM). -DuoNeb every 6 hours -CBC tomorrow -Follow-up blood culture (Reported Corynebacterium on 1 of the 2 samples that per ID and pharmacy is likely due to contamination)  Cauda Equina syndrome: Patient with history of lumbar disc disorder and previous surgeries 13 years ago, presented with worsening of low back pain, inability to walk, lower extremity weakness and numbness and fecal incontinency.  Assistant with cauda equina syndrome.  He is currently giving pain medications, neurosurgery is on board, planning to do a posterior lateral fusion on Wednesday per neurosurgery (after finishing to biotic course for pneumonia) POC bladder ultrasound today did not show urinary retention in setting of cauda syndrome.  -Planning for posterior lateral fusion on Wednesday - ContinueToradol 30 q6h prn -Continue Oxycodone &tylenol 5 mg q6h prn  -Continue Dilaudid 0.5 mg q4h breakthrough pain   Hypotension:In the setting of CAP, poor po intake. Got NS bolus yesterday and BP has remained around 90s/60s. And HR 96. Patient is asymptomatic. Will  monitoring, holding Metoprolol and holding parameters when getting opioids for pain.  -Hold metoprolol -Hold parameters for opioids:Hold & Call MD if SBP<90, HR<65, RR<10, O2<90, or altered mental status  Hx A-fib, Tachy brady syndrome with Visual merchandiser. Asymptomatic. Nl to borderline tachycardia today - Holding metoprolol with hypotension  - Continue digoxin  - I&Os -Continue cardiac monitoring  Insulin dependent type II DM:With hypoglycemia due to decreased po intake during this hospitalization.    Diabetic regimen decreased. No further hypoglycemia. Glucose 98 today.  - Continue Novolog 2 units TID  - Continue Lantus 8 QHS - Continue sensitive SSI  TID  - Continue CBG monitoring  Acute on Chronic Normocytic Anemia: History of GI ulcer per report (no medical records) No clinical bleeding. Down to 7.9 from 10.5 on admission. No evidence of bleeding. Seems to be dilutional, due to IV hydration therapy since admison. FOBT canceled. Will monitor CBC.  - CBC Daily. Transfusion goal hgb > 7.0  Dispo: Anticipated discharge in approximately 4-5 days   Chevis Pretty, MD 07/03/2018, 5:18 AM Pager:[phone]

## 2018-07-04 DIAGNOSIS — K5903 Drug induced constipation: Secondary | ICD-10-CM

## 2018-07-04 DIAGNOSIS — T402X5A Adverse effect of other opioids, initial encounter: Secondary | ICD-10-CM

## 2018-07-04 LAB — COMPREHENSIVE METABOLIC PANEL
ALT: 74 U/L — ABNORMAL HIGH (ref 0–44)
AST: 86 U/L — ABNORMAL HIGH (ref 15–41)
Albumin: 1.6 g/dL — ABNORMAL LOW (ref 3.5–5.0)
Alkaline Phosphatase: 112 U/L (ref 38–126)
Anion gap: 5 (ref 5–15)
BUN: 33 mg/dL — ABNORMAL HIGH (ref 8–23)
CO2: 25 mmol/L (ref 22–32)
Calcium: 8.1 mg/dL — ABNORMAL LOW (ref 8.9–10.3)
Chloride: 108 mmol/L (ref 98–111)
Creatinine, Ser: 0.93 mg/dL (ref 0.61–1.24)
GFR calc Af Amer: >60 mL/min (ref >60)
GFR calc non Af Amer: >60 mL/min (ref >60)
Glucose, Bld: 123 mg/dL — ABNORMAL HIGH (ref 70–99)
Potassium: 4.6 mmol/L (ref 3.5–5.1)
SODIUM: 138 mmol/L (ref 135–145)
Total Bilirubin: 0.4 mg/dL (ref 0.3–1.2)
Total Protein: 4.4 g/dL — ABNORMAL LOW (ref 6.5–8.1)

## 2018-07-04 LAB — CBC
HCT: 27.7 % — ABNORMAL LOW (ref 39.0–52.0)
Hemoglobin: 7.9 g/dL — ABNORMAL LOW (ref 13.0–17.0)
MCH: 24.5 pg — ABNORMAL LOW (ref 26.0–34.0)
MCHC: 28.5 g/dL — ABNORMAL LOW (ref 30.0–36.0)
MCV: 86 fL (ref 80.0–100.0)
NRBC: 0 % (ref 0.0–0.2)
PLATELETS: 163 10*3/uL (ref 150–400)
RBC: 3.22 MIL/uL — ABNORMAL LOW (ref 4.22–5.81)
RDW: 18.9 % — ABNORMAL HIGH (ref 11.5–15.5)
WBC: 8.4 10*3/uL (ref 4.0–10.5)

## 2018-07-04 LAB — GLUCOSE, CAPILLARY
Glucose-Capillary: 88 mg/dL (ref 70–99)
Glucose-Capillary: 166 mg/dL — ABNORMAL HIGH (ref 70–99)
Glucose-Capillary: 157 mg/dL — ABNORMAL HIGH (ref 70–99)
Glucose-Capillary: 201 mg/dL — ABNORMAL HIGH (ref 70–99)

## 2018-07-04 LAB — HIV 1/2 AB DIFFERENTIATION
HIV 1 Ab: NEGATIVE
HIV 2 Ab: NEGATIVE
Note: NEGATIVE

## 2018-07-04 LAB — HIV ANTIBODY (ROUTINE TESTING W REFLEX): HIV Screen 4th Generation wRfx: REACTIVE — AB

## 2018-07-04 LAB — CULTURE, BLOOD (ROUTINE X 2)
Culture: NO GROWTH
Special Requests: ADEQUATE

## 2018-07-04 LAB — RNA QUALITATIVE: HIV 1 RNA Qualitative: 1

## 2018-07-04 MED ORDER — INSULIN GLARGINE 100 UNIT/ML ~~LOC~~ SOLN
4.0000 [IU] | Freq: Every day | SUBCUTANEOUS | Status: DC
  Administered 2018-07-04: 4 [IU] via SUBCUTANEOUS
  Filled 2018-07-04 (×2): qty 0.04

## 2018-07-04 MED ORDER — SENNOSIDES-DOCUSATE SODIUM 8.6-50 MG PO TABS
1.0000 | ORAL_TABLET | Freq: Two times a day (BID) | ORAL | Status: DC
  Administered 2018-07-04 – 2018-07-13 (×17): 1 via ORAL
  Filled 2018-07-04 (×18): qty 1

## 2018-07-04 MED ORDER — IPRATROPIUM-ALBUTEROL 0.5-2.5 (3) MG/3ML IN SOLN
3.0000 mL | Freq: Two times a day (BID) | RESPIRATORY_TRACT | Status: DC
  Administered 2018-07-04 – 2018-07-10 (×8): 3 mL via RESPIRATORY_TRACT
  Filled 2018-07-04 (×12): qty 3

## 2018-07-04 MED ORDER — IPRATROPIUM-ALBUTEROL 0.5-2.5 (3) MG/3ML IN SOLN: 3.0000 mL | RESPIRATORY_TRACT | Status: DC

## 2018-07-04 NOTE — Progress Notes (Addendum)
   Subjective: Cough and breathing have improved today. Overall doing well, complaining of some back pain. PRNs are helping. Has developed some constipation, last BM Friday.   Objective:  Vital signs in last 24 hours: Vitals:   07/03/18 1634 07/03/18 2015 07/04/18 0006 07/04/18 0733  BP: (!) 111/57 121/80 (!) 97/57 123/60  Pulse: (!) 50  (!) 102 (!) 59  Resp: 18   13  Temp: 98.3 F (36.8 C)  97.7 F (36.5 C) 98.1 F (36.7 C)  TempSrc: Oral  Oral Oral  SpO2: 99%  96% 98%  Weight:      Height:       Physical Exam Constitutional: NAD, appears comfortable Cardiovascular: RRR, no murmurs, rubs, or gallops.  Pulmonary/Chest: Bilateral expiratory wheezing, some rhales  Extremities: Warm and well perfused. Bilateral venous stasis dermatitis, multiple healing wounds Neurological: Laying flat in bed, moving both lower extremities spontaneously    Assessment/Plan:  63 yo M with pmhx of CHF, chronic a-fib, type II DM, lumbar stenosis who was sent to the ED by his neurosurgeon for worsening low back pain with associated lower extremity weakness, numbness, and fecal incontinence. Pre operatively found to have PNA. Admitted for treatment and optimization prior to surgery.   Cauda Equina Syndrome: Neurosurgery following, scheduled for decompressive laminectomy and fusion tomorrow morning.  -- NPO@ MN -- Toradol 30 q6h prn -- Oxycodone & tylenol 5 mg q6h prn  -- Dilaudid 0.5 mg q4h breakthrough pain   Opioid Induced Constipation: Last BM Friday -- Scheduled Senokot-S BID  CAP COPD Completed 5 day course of ceftriaxone and azithromycin. Duonebs added for COPD exacerbation. Overall symptoms significantly improved. Continues to have some wheezing.  -- Increase duonebs to q4h scheduled while awake  IDDM: -- Half lantus; 4 unit QHS tonight & hold meal time while NPO -- Continue SSI-sensitive TID AC & QHS  FEN: None, replete lytes prn, HH / Carb mod, NPO @ MN VTE ppx: Hold lovenox -  SCDs Code Status: FULL   Dispo: Anticipated discharge in approximately 5-7 days.   Reymundo Poll, MD 07/04/2018, 7:53 AM Pager: [phone]

## 2018-07-04 NOTE — Progress Notes (Signed)
Subjective: Patient reports back pain and continuing to lay flat. Lower extremities still weak but unchanged.   Objective: Vital signs in last 24 hours: Temp:  [97.7 F (36.5 C)-98.3 F (36.8 C)] 98.1 F (36.7 C) (03/03 0733) Pulse Rate:  [50-102] 59 (03/03 0733) Resp:  [13-18] 13 (03/03 0733) BP: (97-123)/(57-80) 123/60 (03/03 0733) SpO2:  [96 %-99 %] 98 % (03/03 0733)  Intake/Output from previous day: 03/02 0701 - 03/03 0700 In: 440 [P.O.:440] Out: 975 [Urine:975] Intake/Output this shift: No intake/output data recorded.   Lab Results: Lab Results  Component Value Date   WBC 8.4 07/04/2018   HGB 7.9 (L) 07/04/2018   HCT 27.7 (L) 07/04/2018   MCV 86.0 07/04/2018   PLT 163 07/04/2018   Lab Results  Component Value Date   INR 1.1 06/30/2018   BMET Lab Results  Component Value Date   NA 138 07/04/2018   K 4.6 07/04/2018   CL 108 07/04/2018   CO2 25 07/04/2018   GLUCOSE 123 (H) 07/04/2018   BUN 33 (H) 07/04/2018   CREATININE 0.93 07/04/2018   CALCIUM 8.1 (L) 07/04/2018    Studies/Results: No results found.  Assessment/Plan: Planning for fusion tomorrow. NPO after midnight.    LOS: 5 days    Tiana Loft Janet Decesare 07/04/2018, 8:01 AM

## 2018-07-05 ENCOUNTER — Encounter (HOSPITAL_COMMUNITY): Payer: Self-pay

## 2018-07-05 ENCOUNTER — Inpatient Hospital Stay (HOSPITAL_COMMUNITY): Payer: BLUE CROSS/BLUE SHIELD

## 2018-07-05 ENCOUNTER — Inpatient Hospital Stay (HOSPITAL_COMMUNITY): Admission: RE | Admit: 2018-07-05 | Payer: BLUE CROSS/BLUE SHIELD | Source: Home / Self Care | Admitting: Neurosurgery

## 2018-07-05 ENCOUNTER — Inpatient Hospital Stay (HOSPITAL_COMMUNITY): Payer: BLUE CROSS/BLUE SHIELD | Admitting: Vascular Surgery

## 2018-07-05 ENCOUNTER — Encounter (HOSPITAL_COMMUNITY)
Admission: EM | Disposition: A | Discharge status: Skilled Nursing Facility | Payer: Self-pay | Source: Home / Self Care | Attending: Student in an Organized Health Care Education/Training Program

## 2018-07-05 DIAGNOSIS — E875 Hyperkalemia: Secondary | ICD-10-CM

## 2018-07-05 DIAGNOSIS — Z9889 Other specified postprocedural states: Secondary | ICD-10-CM

## 2018-07-05 DIAGNOSIS — R740 Nonspecific elevation of levels of transaminase and lactic acid dehydrogenase [LDH]: Secondary | ICD-10-CM

## 2018-07-05 DIAGNOSIS — I340 Nonrheumatic mitral (valve) insufficiency: Secondary | ICD-10-CM

## 2018-07-05 DIAGNOSIS — Z950 Presence of cardiac pacemaker: Secondary | ICD-10-CM

## 2018-07-05 DIAGNOSIS — M48061 Spinal stenosis, lumbar region without neurogenic claudication: Secondary | ICD-10-CM | POA: Diagnosis present

## 2018-07-05 DIAGNOSIS — E119 Type 2 diabetes mellitus without complications: Secondary | ICD-10-CM

## 2018-07-05 DIAGNOSIS — I482 Chronic atrial fibrillation, unspecified: Secondary | ICD-10-CM

## 2018-07-05 LAB — COMPREHENSIVE METABOLIC PANEL
ALBUMIN: 1.6 g/dL — AB (ref 3.5–5.0)
ALT: 101 U/L — ABNORMAL HIGH (ref 0–44)
ANION GAP: 6 (ref 5–15)
AST: 107 U/L — ABNORMAL HIGH (ref 15–41)
Alkaline Phosphatase: 100 U/L (ref 38–126)
BUN: 35 mg/dL — ABNORMAL HIGH (ref 8–23)
CO2: 25 mmol/L (ref 22–32)
Calcium: 8 mg/dL — ABNORMAL LOW (ref 8.9–10.3)
Chloride: 109 mmol/L (ref 98–111)
Creatinine, Ser: 0.98 mg/dL (ref 0.61–1.24)
GFR calc Af Amer: >60 mL/min (ref >60)
GFR calc non Af Amer: >60 mL/min (ref >60)
Glucose, Bld: 124 mg/dL — ABNORMAL HIGH (ref 70–99)
Potassium: 5.3 mmol/L — ABNORMAL HIGH (ref 3.5–5.1)
Sodium: 140 mmol/L (ref 135–145)
TOTAL PROTEIN: 4.4 g/dL — AB (ref 6.5–8.1)
Total Bilirubin: 0.1 mg/dL — ABNORMAL LOW (ref 0.3–1.2)

## 2018-07-05 LAB — PHOSPHORUS: Phosphorus: 2.5 mg/dL (ref 2.5–4.6)

## 2018-07-05 LAB — CBC
HCT: 25.5 % — ABNORMAL LOW (ref 39.0–52.0)
Hemoglobin: 7.4 g/dL — ABNORMAL LOW (ref 13.0–17.0)
MCH: 25.2 pg — ABNORMAL LOW (ref 26.0–34.0)
MCHC: 29 g/dL — ABNORMAL LOW (ref 30.0–36.0)
MCV: 86.7 fL (ref 80.0–100.0)
Platelets: 162 10*3/uL (ref 150–400)
RBC: 2.94 MIL/uL — AB (ref 4.22–5.81)
RDW: 19.2 % — ABNORMAL HIGH (ref 11.5–15.5)
WBC: 7.4 10*3/uL (ref 4.0–10.5)
nRBC: 0 % (ref 0.0–0.2)

## 2018-07-05 LAB — POCT I-STAT 7, (LYTES, BLD GAS, ICA,H+H)
Acid-Base Excess: 6 mmol/L — ABNORMAL HIGH (ref 0.0–2.0)
Bicarbonate: 31.6 mmol/L — ABNORMAL HIGH (ref 20.0–28.0)
Calcium, Ion: 1.23 mmol/L (ref 1.15–1.40)
HCT: 26 % — ABNORMAL LOW (ref 39.0–52.0)
Hemoglobin: 8.8 g/dL — ABNORMAL LOW (ref 13.0–17.0)
O2 Saturation: 100 %
Patient temperature: 37
Potassium: 5 mmol/L (ref 3.5–5.1)
Sodium: 140 mmol/L (ref 135–145)
TCO2: 33 mmol/L — AB (ref 22–32)
pCO2 arterial: 47.8 mmHg (ref 32.0–48.0)
pH, Arterial: 7.428 (ref 7.350–7.450)
pO2, Arterial: 346 mmHg — ABNORMAL HIGH (ref 83.0–108.0)

## 2018-07-05 LAB — ECHOCARDIOGRAM COMPLETE
Height: 74 in
Weight: 3333.36 oz

## 2018-07-05 LAB — POCT I-STAT 4, (NA,K, GLUC, HGB,HCT)
Glucose, Bld: 109 mg/dL — ABNORMAL HIGH (ref 70–99)
HCT: 30 % — ABNORMAL LOW (ref 39.0–52.0)
Hemoglobin: 10.2 g/dL — ABNORMAL LOW (ref 13.0–17.0)
Potassium: 5 mmol/L (ref 3.5–5.1)
Sodium: 139 mmol/L (ref 135–145)

## 2018-07-05 LAB — GLUCOSE, CAPILLARY
Glucose-Capillary: 112 mg/dL — ABNORMAL HIGH (ref 70–99)
Glucose-Capillary: 118 mg/dL — ABNORMAL HIGH (ref 70–99)

## 2018-07-05 LAB — PREPARE RBC (CROSSMATCH)

## 2018-07-05 LAB — SURGICAL PCR SCREEN
MRSA, PCR: POSITIVE — AB
Staphylococcus aureus: POSITIVE — AB

## 2018-07-05 LAB — ABO/RH: ABO/RH(D): O POS

## 2018-07-05 LAB — MAGNESIUM: MAGNESIUM: 1.9 mg/dL (ref 1.7–2.4)

## 2018-07-05 SURGERY — POSTERIOR LUMBAR FUSION 3 LEVEL
Anesthesia: General | Site: Back

## 2018-07-05 MED ORDER — CYCLOBENZAPRINE HCL 10 MG PO TABS
10.0000 mg | ORAL_TABLET | Freq: Three times a day (TID) | ORAL | Status: DC | PRN
  Administered 2018-07-06 – 2018-07-12 (×13): 10 mg via ORAL
  Filled 2018-07-05 (×15): qty 1

## 2018-07-05 MED ORDER — HYDROMORPHONE HCL 1 MG/ML IJ SOLN
INTRAMUSCULAR | Status: AC
  Administered 2018-07-06: 1 mg
  Filled 2018-07-05: qty 1

## 2018-07-05 MED ORDER — CEFAZOLIN SODIUM 1 G IJ SOLR
INTRAMUSCULAR | Status: AC
  Filled 2018-07-05: qty 20

## 2018-07-05 MED ORDER — FENTANYL CITRATE (PF) 250 MCG/5ML IJ SOLN
INTRAMUSCULAR | Status: AC
  Filled 2018-07-05: qty 5

## 2018-07-05 MED ORDER — SODIUM CHLORIDE 0.9% FLUSH
3.0000 mL | INTRAVENOUS | Status: DC | PRN
  Administered 2018-07-06: 3 mL via INTRAVENOUS
  Filled 2018-07-05: qty 3

## 2018-07-05 MED ORDER — PROPOFOL 10 MG/ML IV BOLUS
INTRAVENOUS | Status: AC
  Filled 2018-07-05: qty 20

## 2018-07-05 MED ORDER — PROMETHAZINE HCL 25 MG/ML IJ SOLN: 6.2500 mg | INTRAMUSCULAR | Status: DC | PRN

## 2018-07-05 MED ORDER — FENTANYL CITRATE (PF) 250 MCG/5ML IJ SOLN
INTRAMUSCULAR | Status: DC | PRN
  Administered 2018-07-05 (×2): 50 ug via INTRAVENOUS
  Administered 2018-07-05: 100 ug via INTRAVENOUS
  Administered 2018-07-05: 50 ug via INTRAVENOUS
  Administered 2018-07-05: 100 ug via INTRAVENOUS
  Administered 2018-07-05: 50 ug via INTRAVENOUS

## 2018-07-05 MED ORDER — MIDAZOLAM HCL 2 MG/2ML IJ SOLN
INTRAMUSCULAR | Status: AC
  Filled 2018-07-05: qty 2

## 2018-07-05 MED ORDER — ACETAMINOPHEN 325 MG PO TABS
650.0000 mg | ORAL_TABLET | ORAL | Status: DC | PRN
  Administered 2018-07-08 – 2018-07-12 (×8): 650 mg via ORAL
  Filled 2018-07-05 (×9): qty 2

## 2018-07-05 MED ORDER — SODIUM CHLORIDE 0.9% IV SOLUTION
Freq: Once | INTRAVENOUS | Status: AC
  Administered 2018-07-05: 13:00:00 via INTRAVENOUS

## 2018-07-05 MED ORDER — ONDANSETRON HCL 4 MG/2ML IJ SOLN
INTRAMUSCULAR | Status: AC
  Filled 2018-07-05: qty 2

## 2018-07-05 MED ORDER — SODIUM CHLORIDE 0.9 % IV SOLN
INTRAVENOUS | Status: DC | PRN
  Administered 2018-07-05: 18:00:00

## 2018-07-05 MED ORDER — LIDOCAINE 2% (20 MG/ML) 5 ML SYRINGE
INTRAMUSCULAR | Status: AC
  Filled 2018-07-05: qty 5

## 2018-07-05 MED ORDER — PANTOPRAZOLE SODIUM 40 MG IV SOLR
40.0000 mg | Freq: Every day | INTRAVENOUS | Status: DC
  Administered 2018-07-05 – 2018-07-06 (×2): 40 mg via INTRAVENOUS
  Filled 2018-07-05 (×2): qty 40

## 2018-07-05 MED ORDER — ONDANSETRON HCL 4 MG PO TABS: 4.0000 mg | ORAL_TABLET | Freq: Four times a day (QID) | ORAL | Status: DC | PRN

## 2018-07-05 MED ORDER — MENTHOL 3 MG MT LOZG
1.0000 | LOZENGE | OROMUCOSAL | Status: DC | PRN
  Filled 2018-07-05: qty 9

## 2018-07-05 MED ORDER — NAPHAZOLINE-GLYCERIN 0.012-0.2 % OP SOLN
1.0000 [drp] | Freq: Four times a day (QID) | OPHTHALMIC | Status: DC | PRN
  Administered 2018-07-05: 1 [drp] via OPHTHALMIC
  Filled 2018-07-05 (×2): qty 15

## 2018-07-05 MED ORDER — ALUM & MAG HYDROXIDE-SIMETH 200-200-20 MG/5ML PO SUSP: 30.0000 mL | Freq: Four times a day (QID) | ORAL | Status: DC | PRN

## 2018-07-05 MED ORDER — CEFAZOLIN SODIUM-DEXTROSE 2-4 GM/100ML-% IV SOLN
INTRAVENOUS | Status: AC
  Filled 2018-07-05: qty 100

## 2018-07-05 MED ORDER — POLYETHYLENE GLYCOL 3350 17 G PO PACK: 17.0000 g | PACK | Freq: Every day | ORAL | Status: DC | PRN

## 2018-07-05 MED ORDER — SODIUM CHLORIDE 0.9 % IV SOLN: 250.0000 mL | INTRAVENOUS | Status: DC

## 2018-07-05 MED ORDER — MUPIROCIN 2 % EX OINT
1.0000 "application " | TOPICAL_OINTMENT | Freq: Two times a day (BID) | CUTANEOUS | Status: AC
  Administered 2018-07-06 – 2018-07-10 (×9): 1 via NASAL
  Filled 2018-07-05 (×3): qty 22

## 2018-07-05 MED ORDER — ONDANSETRON HCL 4 MG/2ML IJ SOLN: 4.0000 mg | Freq: Four times a day (QID) | INTRAMUSCULAR | Status: DC | PRN

## 2018-07-05 MED ORDER — SODIUM POLYSTYRENE SULFONATE 15 GM/60ML PO SUSP
30.0000 g | Freq: Once | ORAL | Status: AC
  Administered 2018-07-05: 30 g via ORAL
  Filled 2018-07-05: qty 120

## 2018-07-05 MED ORDER — ROCURONIUM BROMIDE 50 MG/5ML IV SOSY
PREFILLED_SYRINGE | INTRAVENOUS | Status: DC | PRN
  Administered 2018-07-05: 20 mg via INTRAVENOUS
  Administered 2018-07-05: 10 mg via INTRAVENOUS
  Administered 2018-07-05 (×2): 20 mg via INTRAVENOUS
  Administered 2018-07-05: 50 mg via INTRAVENOUS

## 2018-07-05 MED ORDER — LACTATED RINGERS IV SOLN
INTRAVENOUS | Status: DC | PRN
  Administered 2018-07-05: 17:00:00 via INTRAVENOUS

## 2018-07-05 MED ORDER — BUPIVACAINE LIPOSOME 1.3 % IJ SUSP
INTRAMUSCULAR | Status: DC | PRN
  Administered 2018-07-05: 20 mL

## 2018-07-05 MED ORDER — BUPIVACAINE LIPOSOME 1.3 % IJ SUSP
20.0000 mL | INTRAMUSCULAR | Status: AC
  Filled 2018-07-05: qty 20

## 2018-07-05 MED ORDER — ROCURONIUM BROMIDE 50 MG/5ML IV SOSY
PREFILLED_SYRINGE | INTRAVENOUS | Status: AC
  Filled 2018-07-05: qty 5

## 2018-07-05 MED ORDER — FENTANYL CITRATE (PF) 100 MCG/2ML IJ SOLN
INTRAMUSCULAR | Status: AC
  Administered 2018-07-05: 50 ug via INTRAVENOUS
  Filled 2018-07-05: qty 2

## 2018-07-05 MED ORDER — LIDOCAINE-EPINEPHRINE 1 %-1:100000 IJ SOLN
INTRAMUSCULAR | Status: DC | PRN
  Administered 2018-07-05: 10 mL

## 2018-07-05 MED ORDER — HYDROMORPHONE HCL 1 MG/ML IJ SOLN
1.0000 mg | INTRAMUSCULAR | Status: AC | PRN
  Administered 2018-07-05 – 2018-07-06 (×2): 1 mg via INTRAVENOUS
  Filled 2018-07-05 (×3): qty 1

## 2018-07-05 MED ORDER — SODIUM POLYSTYRENE SULFONATE 15 GM/60ML PO SUSP
30.0000 g | Freq: Once | ORAL | Status: DC
  Filled 2018-07-05 (×2): qty 120

## 2018-07-05 MED ORDER — ACETAMINOPHEN 650 MG RE SUPP: 650.0000 mg | RECTAL | Status: DC | PRN

## 2018-07-05 MED ORDER — DEXAMETHASONE SODIUM PHOSPHATE 10 MG/ML IJ SOLN
INTRAMUSCULAR | Status: DC | PRN
  Administered 2018-07-05: 10 mg via INTRAVENOUS

## 2018-07-05 MED ORDER — SODIUM CHLORIDE 0.9% IV SOLUTION: Freq: Once | INTRAVENOUS | Status: DC

## 2018-07-05 MED ORDER — LACTATED RINGERS IV SOLN
INTRAVENOUS | Status: DC | PRN
  Administered 2018-07-05 (×2): via INTRAVENOUS

## 2018-07-05 MED ORDER — 0.9 % SODIUM CHLORIDE (POUR BTL) OPTIME
TOPICAL | Status: DC | PRN
  Administered 2018-07-05: 1000 mL

## 2018-07-05 MED ORDER — HYDROMORPHONE HCL 1 MG/ML IJ SOLN
1.0000 mg | INTRAMUSCULAR | Status: DC | PRN
  Administered 2018-07-06 (×2): 1 mg via INTRAVENOUS
  Filled 2018-07-05 (×2): qty 1

## 2018-07-05 MED ORDER — FENTANYL CITRATE (PF) 100 MCG/2ML IJ SOLN
50.0000 ug | Freq: Once | INTRAMUSCULAR | Status: AC
  Administered 2018-07-05: 50 ug via INTRAVENOUS

## 2018-07-05 MED ORDER — MUPIROCIN 2 % EX OINT: 1.0000 "application " | TOPICAL_OINTMENT | Freq: Two times a day (BID) | CUTANEOUS | Status: DC

## 2018-07-05 MED ORDER — CEFAZOLIN SODIUM-DEXTROSE 2-3 GM-%(50ML) IV SOLR
INTRAVENOUS | Status: DC | PRN
  Administered 2018-07-05 (×2): 2 g via INTRAVENOUS

## 2018-07-05 MED ORDER — CHLORHEXIDINE GLUCONATE CLOTH 2 % EX PADS
6.0000 | MEDICATED_PAD | Freq: Every day | CUTANEOUS | Status: AC
  Administered 2018-07-05 – 2018-07-09 (×6): 6 via TOPICAL

## 2018-07-05 MED ORDER — SODIUM CHLORIDE 0.9 % IV SOLN
INTRAVENOUS | Status: DC | PRN
  Administered 2018-07-05: 15 ug/min via INTRAVENOUS

## 2018-07-05 MED ORDER — HYDROMORPHONE HCL 1 MG/ML IJ SOLN
INTRAMUSCULAR | Status: AC
  Filled 2018-07-05: qty 1

## 2018-07-05 MED ORDER — LIDOCAINE 2% (20 MG/ML) 5 ML SYRINGE
INTRAMUSCULAR | Status: DC | PRN
  Administered 2018-07-05: 30 mg via INTRAVENOUS

## 2018-07-05 MED ORDER — PHENOL 1.4 % MT LIQD: 1.0000 | OROMUCOSAL | Status: DC | PRN

## 2018-07-05 MED ORDER — HYDROMORPHONE HCL 1 MG/ML IJ SOLN
0.2500 mg | INTRAMUSCULAR | Status: DC | PRN
  Administered 2018-07-05 (×3): 0.5 mg via INTRAVENOUS

## 2018-07-05 MED ORDER — DEXAMETHASONE SODIUM PHOSPHATE 10 MG/ML IJ SOLN
INTRAMUSCULAR | Status: AC
  Filled 2018-07-05: qty 1

## 2018-07-05 MED ORDER — ONDANSETRON HCL 4 MG/2ML IJ SOLN
INTRAMUSCULAR | Status: DC | PRN
  Administered 2018-07-05: 4 mg via INTRAVENOUS

## 2018-07-05 MED ORDER — OXYCODONE HCL 5 MG PO TABS
10.0000 mg | ORAL_TABLET | ORAL | Status: DC | PRN
  Administered 2018-07-06 (×2): 10 mg via ORAL
  Filled 2018-07-05 (×2): qty 2

## 2018-07-05 MED ORDER — SODIUM CHLORIDE 0.9 % IV SOLN
INTRAVENOUS | Status: DC | PRN
  Administered 2018-07-05: 18:00:00 via INTRAVENOUS

## 2018-07-05 MED ORDER — SUGAMMADEX SODIUM 200 MG/2ML IV SOLN
INTRAVENOUS | Status: DC | PRN
  Administered 2018-07-05: 400 mg via INTRAVENOUS

## 2018-07-05 MED ORDER — SODIUM CHLORIDE 0.9% FLUSH
3.0000 mL | Freq: Two times a day (BID) | INTRAVENOUS | Status: DC
  Administered 2018-07-05 – 2018-07-12 (×14): 3 mL via INTRAVENOUS
  Administered 2018-07-12: 21:00:00 via INTRAVENOUS

## 2018-07-05 MED ORDER — CEFAZOLIN SODIUM-DEXTROSE 2-4 GM/100ML-% IV SOLN
2.0000 g | Freq: Three times a day (TID) | INTRAVENOUS | Status: AC
  Administered 2018-07-06 (×2): 2 g via INTRAVENOUS
  Filled 2018-07-05 (×2): qty 100

## 2018-07-05 MED ORDER — HYDRALAZINE HCL 20 MG/ML IJ SOLN: 2.0000 mg | Freq: Four times a day (QID) | INTRAMUSCULAR | Status: DC | PRN

## 2018-07-05 MED ORDER — PROPOFOL 10 MG/ML IV BOLUS
INTRAVENOUS | Status: DC | PRN
  Administered 2018-07-05: 80 mg via INTRAVENOUS

## 2018-07-05 MED ORDER — THROMBIN 20000 UNITS EX SOLR
CUTANEOUS | Status: DC | PRN
  Administered 2018-07-05: 18:00:00 via TOPICAL

## 2018-07-05 SURGICAL SUPPLY — 79 items
BAG DECANTER FOR FLEXI CONT (MISCELLANEOUS) ×3 IMPLANT
BENZOIN TINCTURE PRP APPL 2/3 (GAUZE/BANDAGES/DRESSINGS) ×3 IMPLANT
BLADE CLIPPER SURG (BLADE) IMPLANT
BLADE SURG 11 STRL SS (BLADE) ×3 IMPLANT
BONE VIVIGEN FORMABLE 5.4CC (Bone Implant) ×3 IMPLANT
BUR CUTTER 7.0 ROUND (BURR) ×3 IMPLANT
BUR MATCHSTICK NEURO 3.0 LAGG (BURR) ×3 IMPLANT
CANISTER SUCT 3000ML PPV (MISCELLANEOUS) ×3 IMPLANT
CAP LOCKING THREADED (Cap) ×21 IMPLANT
CARTRIDGE OIL MAESTRO DRILL (MISCELLANEOUS) ×1 IMPLANT
CLOSURE WOUND 1/2 X4 (GAUZE/BANDAGES/DRESSINGS) ×2
CONT SPEC 4OZ CLIKSEAL STRL BL (MISCELLANEOUS) ×3 IMPLANT
COVER BACK TABLE 60X90IN (DRAPES) ×3 IMPLANT
COVER WAND RF STERILE (DRAPES) IMPLANT
DECANTER SPIKE VIAL GLASS SM (MISCELLANEOUS) IMPLANT
DERMABOND ADVANCED (GAUZE/BANDAGES/DRESSINGS) ×4
DERMABOND ADVANCED .7 DNX12 (GAUZE/BANDAGES/DRESSINGS) ×2 IMPLANT
DIFFUSER DRILL AIR PNEUMATIC (MISCELLANEOUS) ×3 IMPLANT
DRAPE C-ARM 42X72 X-RAY (DRAPES) ×3 IMPLANT
DRAPE C-ARMOR (DRAPES) ×3 IMPLANT
DRAPE HALF SHEET 40X57 (DRAPES) ×3 IMPLANT
DRAPE LAPAROTOMY 100X72X124 (DRAPES) ×3 IMPLANT
DRAPE SURG 17X23 STRL (DRAPES) ×3 IMPLANT
DRSG OPSITE 4X5.5 SM (GAUZE/BANDAGES/DRESSINGS) IMPLANT
DRSG OPSITE POSTOP 4X12 (GAUZE/BANDAGES/DRESSINGS) ×3 IMPLANT
DRSG OPSITE POSTOP 4X8 (GAUZE/BANDAGES/DRESSINGS) IMPLANT
DURAPREP 26ML APPLICATOR (WOUND CARE) ×3 IMPLANT
ELECT REM PT RETURN 9FT ADLT (ELECTROSURGICAL) ×3
ELECTRODE REM PT RTRN 9FT ADLT (ELECTROSURGICAL) ×1 IMPLANT
EVACUATOR 3/16  PVC DRAIN (DRAIN)
EVACUATOR 3/16 PVC DRAIN (DRAIN) IMPLANT
FIBER BOAT ALLOFUSE 7.5CC (Bone Implant) ×3 IMPLANT
GAUZE 4X4 16PLY RFD (DISPOSABLE) ×3 IMPLANT
GAUZE SPONGE 4X4 12PLY STRL (GAUZE/BANDAGES/DRESSINGS) IMPLANT
GLOVE BIO SURGEON STRL SZ7 (GLOVE) ×3 IMPLANT
GLOVE BIO SURGEON STRL SZ8 (GLOVE) ×6 IMPLANT
GLOVE BIOGEL PI IND STRL 6.5 (GLOVE) ×2 IMPLANT
GLOVE BIOGEL PI IND STRL 7.0 (GLOVE) ×1 IMPLANT
GLOVE BIOGEL PI INDICATOR 6.5 (GLOVE) ×4
GLOVE BIOGEL PI INDICATOR 7.0 (GLOVE) ×2
GLOVE EXAM NITRILE XL STR (GLOVE) IMPLANT
GLOVE INDICATOR 8.5 STRL (GLOVE) ×6 IMPLANT
GLOVE SURG SS PI 6.0 STRL IVOR (GLOVE) ×12 IMPLANT
GLOVE SURG SS PI 7.0 STRL IVOR (GLOVE) ×6 IMPLANT
GLOVE SURG SS PI 7.5 STRL IVOR (GLOVE) ×3 IMPLANT
GOWN STRL REUS W/ TWL LRG LVL3 (GOWN DISPOSABLE) ×4 IMPLANT
GOWN STRL REUS W/ TWL XL LVL3 (GOWN DISPOSABLE) ×2 IMPLANT
GOWN STRL REUS W/TWL 2XL LVL3 (GOWN DISPOSABLE) IMPLANT
GOWN STRL REUS W/TWL LRG LVL3 (GOWN DISPOSABLE) ×8
GOWN STRL REUS W/TWL XL LVL3 (GOWN DISPOSABLE) ×4
GRAFT BN 10X1XDBM MAGNIFUSE (Bone Implant) ×1 IMPLANT
GRAFT BONE MAGNIFUSE 1X10CM (Bone Implant) ×2 IMPLANT
HEMOSTAT POWDER KIT SURGIFOAM (HEMOSTASIS) IMPLANT
KIT BASIN OR (CUSTOM PROCEDURE TRAY) ×3 IMPLANT
KIT POSITION SURG JACKSON T1 (MISCELLANEOUS) ×3 IMPLANT
KIT TURNOVER KIT B (KITS) ×3 IMPLANT
MILL MEDIUM DISP (BLADE) ×3 IMPLANT
NEEDLE HYPO 21X1.5 SAFETY (NEEDLE) ×3 IMPLANT
NEEDLE HYPO 25X1 1.5 SAFETY (NEEDLE) ×3 IMPLANT
NS IRRIG 1000ML POUR BTL (IV SOLUTION) ×3 IMPLANT
OIL CARTRIDGE MAESTRO DRILL (MISCELLANEOUS) ×3
PACK LAMINECTOMY NEURO (CUSTOM PROCEDURE TRAY) ×3 IMPLANT
PAD ARMBOARD 7.5X6 YLW CONV (MISCELLANEOUS) ×9 IMPLANT
ROD TI CVD 5.5MMX90MM (Rod) ×6 IMPLANT
SCREW CREO 5.5X40 (Screw) ×6 IMPLANT
SCREW CREO 6.5X40 (Screw) ×15 IMPLANT
SCREW PA THRD CREO TULIP 5.5X4 (Head) ×21 IMPLANT
SPONGE LAP 4X18 RFD (DISPOSABLE) IMPLANT
SPONGE SURGIFOAM ABS GEL 100 (HEMOSTASIS) ×3 IMPLANT
STRIP CLOSURE SKIN 1/2X4 (GAUZE/BANDAGES/DRESSINGS) ×4 IMPLANT
SUT VIC AB 0 CT1 18XCR BRD8 (SUTURE) ×2 IMPLANT
SUT VIC AB 0 CT1 8-18 (SUTURE) ×4
SUT VIC AB 2-0 CT1 18 (SUTURE) ×6 IMPLANT
SUT VIC AB 4-0 PS2 27 (SUTURE) ×3 IMPLANT
SYR 20CC LL (SYRINGE) ×3 IMPLANT
TOWEL GREEN STERILE (TOWEL DISPOSABLE) ×3 IMPLANT
TOWEL GREEN STERILE FF (TOWEL DISPOSABLE) ×3 IMPLANT
TRAY FOLEY MTR SLVR 16FR STAT (SET/KITS/TRAYS/PACK) ×3 IMPLANT
WATER STERILE IRR 1000ML POUR (IV SOLUTION) ×3 IMPLANT

## 2018-07-05 NOTE — Anesthesia Procedure Notes (Addendum)
Procedure Name: Intubation Date/Time: 07/05/2018 4:47 PM Performed by: Leonor Liv, CRNA Pre-anesthesia Checklist: Patient identified, Emergency Drugs available, Suction available, Patient being monitored and Timeout performed Patient Re-evaluated:Patient Re-evaluated prior to induction Oxygen Delivery Method: Circle system utilized Preoxygenation: Pre-oxygenation with 100% oxygen Induction Type: IV induction Ventilation: Oral airway inserted - appropriate to patient size Laryngoscope Size: Mac and 4 Grade View: Grade II Tube type: Oral Tube size: 7.5 mm Number of attempts: 1 Airway Equipment and Method: Stylet Placement Confirmation: ETT inserted through vocal cords under direct vision,  positive ETCO2 and breath sounds checked- equal and bilateral Secured at: 22 cm Tube secured with: Tape Dental Injury: Teeth and Oropharynx as per pre-operative assessment

## 2018-07-05 NOTE — Progress Notes (Addendum)
Subjective: Patient was seen and evaluated at bedside on morning rounds, prior to surgery. No acute events overnight. He mentions he still has some cough but doing better. No SOB. He is ready for surgery. His wife and his daughter are in room and ask how long the surgery may take. Their questions and concern were addressed.  Objective:  Vital signs in last 24 hours: Vitals:   07/04/18 0957 07/04/18 1610 07/04/18 2006 07/05/18 0003  BP: 106/68 (!) 123/59  (!) 128/92  Pulse: (!) 102 (!) 56  (!) 101  Resp:  18    Temp:  98.5 F (36.9 C)  98.2 F (36.8 C)  TempSrc:    Oral  SpO2:  100% 97% 96%  Weight:      Height:       Physical Exam Vitals signs reviewed.  HENT:     Head: Normocephalic and atraumatic.  Cardiovascular:     Rate and Rhythm: Normal rate and regular rhythm.     Heart sounds: Normal heart sounds. No murmur.  Pulmonary:     Effort: Pulmonary effort is normal.     Breath sounds: Rhonchi present.  Abdominal:     Palpations: Abdomen is soft.     Tenderness: There is no abdominal tenderness.  Neurological:     Mental Status: He is alert and oriented to person, place, and time.  Psychiatric:        Mood and Affect: Mood normal.        Behavior: Behavior normal.        Thought Content: Thought content normal.     Assessment/Plan:  Principal Problem:   Cauda equina syndrome (HCC) Active Problems:   Community acquired pneumonia   Chronic obstructive lung disease (HCC)   Controlled type 2 diabetes mellitus without complication, without long-term current use of insulin (HCC)   Spinal stenosis of lumbar region  Mr. Sole is a 63 yo M with PMHx of CHF, chronic a-fib, with pace maker, type II DM, lumbar stenosis who was sent to the ED by his neurosurgeon for worsening low back pain, associated lower extremity weakness, numbness, and fecal incontinence. Pre operatively found to have PNA. Admitted for treatment and optimization prior to surgery.    Cauda Equina  Syndrome: Neurosurgery is on boar and following, scheduled for decompressive laminectomy and fusion today.  -Patient has been NPO since last midnight for today surgery -Continue Toradol 30 q6h prn -Continue Oxycodone &tylenol 5 mg q6h prn  -Continue Dilaudid 0.5 mg q4h breakthrough pain  -Continue Senokot-S BID for opioid-induced constipation   ADDENDUM: Surgery canceled due to Hb 7.4, and patient paced. patient got 2 units of blood. Echo ordered, May do the surgery today or tomorrow. We will follow neurosurgery recommendation.   Normocytis anemia: Hb 7.3 (ADDENDUM: S/p 2 unit transfusion before surgery) -Can be due to chronic disease -Can not check iron panel since he got transfusion -Will need out patient Gi work up -CBC daily  Transaminitis:Unclear etiology. Can be due to previous hypotension -CMP tomorrow -Will monitor  CAP COPD Patient is a status post 5-day course of IV ceftriaxone and azithromycin. Still has occasional cough but overall he improved. Remained afebrile, and without any leukocytosis. He is getting every 4 h DuoNeb.   -Continue DuoNeb every 4 hours  Insulin-dependent diabetes type 2: Decreased Lantus to 4 unit (instead of 8) last night and held mealtime insulin due to being n.p.o. -Continue sensitive SSI -4 units glargine at bedtime (May increase after surgery based  on CBGs) -Holding NovoLog 2 units 3 times daily (May resume after surgery based on CBGs)  Pretension: Improved. Today BP 128/92. Was likely in setting of poor p.o. intake, pneumonia.  Received IV fluid. we have held metoprolol and held parameters for opioid. -Monitor vital signs  Hyperkalemia: K5.3 and patient had NSVT -Mg-->nl -Phos--> Nl -Kayaxalate 30 mg PO -BMP daily -Cardiac monitoring  Diet: N.p.o. for surgery IV fluid: None VTE ppx: SCDs (holding Lovenox for surgery) Code status: Full code  Dispo: Anticipated discharge in approximately 2-3 days  Chevis Pretty,  MD 07/05/2018, 8:29 AM Pager: [phone]

## 2018-07-05 NOTE — Op Note (Signed)
Preoperative diagnosis: Cauda equina syndrome severe lumbar spinal stenosis degenerative scoliosis degenerative disc disease and bilateral L3L4 L5 and S1 radiculopathies  Postoperative diagnosis: Same  Procedure: #1 redo decompressive lumbar laminectomies L3-4, L4-5, L5-S1 with complete medial facetectomies and foraminotomies of the L3, L4, L5, and S1 nerve roots  #2 nonsegmental fixation L3-S1 utilizing the globus Creole and modular pedicle screws set  #3 posterior lateral arthrodesis L3-S1 utilizing locally harvested autograft mixed with vivigen and cortical fiber boats and magnafuse  Surgeon: Jillyn Hidden Deandria Klute  Asst.: Julien Girt  Anesthesia: Gen.  EBL: 400  History of present illness:63 year old gentleman presented with progressive worsening back bilateral leg pain bilateral foot drops and loss of control of bowel bladder. Workup showed a complete myographic block at L4 and severe spinal stenosis degenerative scoliosis he previously undergone L2-S1 decompressive laminectomy it looked like heat autofused L2-3. So due to patient progressive clinical syndrome imaging findings I admitted him from the office for medical clearance and tuning up prior to surgery he was diagnosed with pneumonia so we had delay surgery for a week ultimately we've taken the operating room recommended decompression stabilization procedure. I extensively went over the risks and benefits of the operation with the patient and his family as well as perioperative course expectations of outcome and alternatives of surgery and he understood and agreed to proceed forward.  Operative procedure: Patient brought into the or was induced under general anesthesia positioned prone on the [facility] table his back was prepped and draped in routine sterile fashion is old incision was infiltrated with 10 mL lidocaine with epi and a midline incision was made and the scar tissues dissected free from L2 down to the sacrum. Identified residual S1  spinous process and sacral lamina I then dissected through the scar tissue to identify the facet joints at 34, 45, and L5-S1 identify the TPs at L3, L4, L5 and S1 and then performed a decompressive laminotomy at L5-S1 marching superiorly I removed each facet joint in sequence to identify normal anatomy there was a large dystrophic facet joints at the level of L4 causing severe thecal sac compression and severe compression of the L4 nerve root this was teased away off the dura dissected free and removed after marching of both sides and removing central scar thecal sac was widely decompressed I confirmed with x-ray decompression from the inferior aspect of the L3 pedicle down to S1. Then utilizing AP lateral fluoroscopy to the best that could be due to scoliotic deformity imaging was impossible. I cannulated the pedicles the L3 pedicle was somewhat difficult to visualize but I used fluoroscopy AP fluoroscopy to cannulate it then direct visualization because I performed radical foraminotomies at all the awl at all other levels were performed. Posterior fluoroscopy looked to have the implants in good position. Then I added the heads and tightened the screws are some of the rods anchored in place reinspected the foramina to confirm no migration of graft material and confirm patency I aggressively decorticated the TPs from L3 down S1 packed the locally harvested autograft mix along posterior laterally along the lateral facet joints and TPs from L3-S1 then blade Gelfoam and the dura placed a large Hemovac drain injected X Burrell and fascia and closed with layers with interrupted Vicryl and a running 4 subcuticular Dermabond benzo and Steri-Strips and sterile dressing was applied patient recovered in stable condition. At the end the case all needle counts sponge cuts were correct.

## 2018-07-05 NOTE — Anesthesia Preprocedure Evaluation (Addendum)
Anesthesia Evaluation  Patient identified by MRN, date of birth, ID band Patient awake    Reviewed: Allergy & Precautions, NPO status , Patient's Chart, lab work & pertinent test results  Airway Mallampati: II  TM Distance: >3 FB Neck ROM: Full    Dental no notable dental hx.    Pulmonary pneumonia, unresolved, COPD, former smoker,    Pulmonary exam normal breath sounds clear to auscultation + decreased breath sounds      Cardiovascular hypertension, + CAD, + Past MI, + Cardiac Stents, + CABG and +CHF  + dysrhythmias Atrial Fibrillation + pacemaker  Rhythm:Irregular Rate:Normal   The left ventricle has mildly reduced systolic function, with an ejection fraction of 45-50%. The cavity size was normal. There is mildly increased left ventricular wall thickness. Left ventricular diastology could not be evaluated secondary to  atrial fibrillation. Left ventricular diffuse hypokinesis.  2. Left atrial size was mildly dilated.  3. The mitral valve is degenerative. Mild calcification of the posterior mitral valve leaflet. There is moderate mitral annular calcification present.  4. The aortic valve is tricuspid Moderate sclerosis of the aortic valve.  5. The aortic root and ascending aorta are normal in size and structure.  6. The inferior vena cava was dilated in size with <50% respiratory variability.  SUMMARY   LVEF 45-50%, global hypokinesis, incoordinate septal motion, mild LVH, mild LAE, MAC with mild MR, mild TR, RV pacer or AICD wire noted, dilated IVC  FINDINGS  Left Ventricle: The left ventricle has mildly reduced systolic function, with an ejection fraction of 45-50%. The cavity size was normal. There is mildly increased left ventricular wall thickness. Left ventricular diastology could not be evaluated  secondary to atrial fibrillation. Left ventricular diffuse hypokinesis. Right Ventricle: The right ventricle was not well  visualized. The cavity was not assessed. There is right vetricular wall thickness was not assessed. Pacing wire/catheter visualized in the right    Neuro/Psych negative neurological ROS  negative psych ROS   GI/Hepatic negative GI ROS, Neg liver ROS,   Endo/Other  diabetes  Renal/GU negative Renal ROS  negative genitourinary   Musculoskeletal negative musculoskeletal ROS (+)   Abdominal   Peds negative pediatric ROS (+)  Hematology  (+) anemia ,   Anesthesia Other Findings   Reproductive/Obstetrics negative OB ROS                            Anesthesia Physical Anesthesia Plan  ASA: IV  Anesthesia Plan: General   Post-op Pain Management:    Induction: Intravenous  PONV Risk Score and Plan: 2 and Ondansetron, Dexamethasone and Treatment may vary due to age or medical condition  Airway Management Planned: Oral ETT  Additional Equipment: Arterial line  Intra-op Plan:   Post-operative Plan: Possible Post-op intubation/ventilation  Informed Consent: I have reviewed the patients History and Physical, chart, labs and discussed the procedure including the risks, benefits and alternatives for the proposed anesthesia with the patient or authorized representative who has indicated his/her understanding and acceptance.     Dental advisory given  Plan Discussed with: CRNA and Surgeon  Anesthesia Plan Comments: (Severe anemia. No cardiac workup done despite patient being s/p pacemaker, s/p CABG 1992, and 2 stents within the past year. Will transfuse 2 units, get a cardiac assessment done,  and plan to hopefully continue on with surgery later today or tomorrow)       Anesthesia Quick Evaluation

## 2018-07-05 NOTE — Progress Notes (Signed)
Patient leaving unit, going to OR for spinal fusion. Patient alert and oriented x4, with wife and daughter present at the bedside. Patient stable for transfer.

## 2018-07-05 NOTE — Transfer of Care (Signed)
Immediate Anesthesia Transfer of Care Note  Patient: Henry Mcpherson  Procedure(s) Performed: Lumbar three-Sacral one Decompressive Lumbar Laminectomy and Fusion (N/A Back)  Patient Location: PACU  Anesthesia Type:General  Level of Consciousness: awake, alert  and confused  Airway & Oxygen Therapy: Patient connected to face mask oxygen  Post-op Assessment: Report given to RN and Post -op Vital signs reviewed and stable  Post vital signs: Reviewed and stable  Last Vitals:  Vitals Value Taken Time  BP    Temp    Pulse 99 07/05/2018  9:42 PM  Resp 14 07/05/2018  9:42 PM  SpO2 100 % 07/05/2018  9:42 PM  Vitals shown include unvalidated device data.  Last Pain:  Vitals:   07/05/18 1317  TempSrc:   PainSc: 8       Patients Stated Pain Goal: 0 (07/05/18 1052)  Complications: No apparent anesthesia complications

## 2018-07-05 NOTE — Progress Notes (Signed)
Called report to OR Nurse, Transported patient down to the OR. Wife and family present at the bedside. Patient alert x4 with VS stable. OR Nurse received patient.

## 2018-07-05 NOTE — Anesthesia Procedure Notes (Signed)
Arterial Line Insertion Start/End3/08/2018 3:45 PM, 07/05/2018 4:00 PM Performed by: Quentin Ore, CRNA, CRNA  Patient location: Pre-op. Preanesthetic checklist: patient identified, IV checked, site marked, risks and benefits discussed, surgical consent, monitors and equipment checked, pre-op evaluation, timeout performed and anesthesia consent Left, radial was placed Catheter size: 20 G Hand hygiene performed  and maximum sterile barriers used  Allen's test indicative of satisfactory collateral circulation Attempts: 1 Procedure performed without using ultrasound guided technique. Following insertion, dressing applied and Biopatch. Post procedure assessment: normal  Patient tolerated the procedure well with no immediate complications.

## 2018-07-05 NOTE — Anesthesia Procedure Notes (Signed)
Performed by: Giles Currie, CRNA       

## 2018-07-05 NOTE — Progress Notes (Signed)
Subjective: Patient reports overall patient stable denies any new back or leg pain continues to have same weakness in his feet difficulty ambulating  Objective: Vital signs in last 24 hours: Temp:  [98.2 F (36.8 C)-98.5 F (36.9 C)] 98.2 F (36.8 C) (03/04 0003) Pulse Rate:  [56-102] 101 (03/04 0003) Resp:  [18] 18 (03/03 1610) BP: (106-128)/(59-92) 128/92 (03/04 0003) SpO2:  [96 %-100 %] 96 % (03/04 0003)  Intake/Output from previous day: 03/03 0701 - 03/04 0700 In: 950 [P.O.:950] Out: -  Intake/Output this shift: No intake/output data recorded.  neurologically unchanged bilateral foot drops proximal leg weakness at 5  Lab Results: Recent Labs    07/04/18 0331 07/05/18 0249  WBC 8.4 7.4  HGB 7.9* 7.4*  HCT 27.7* 25.5*  PLT 163 162   BMET Recent Labs    07/04/18 0331 07/05/18 0249  NA 138 140  K 4.6 5.3*  CL 108 109  CO2 25 25  GLUCOSE 123* 124*  BUN 33* 35*  CREATININE 0.93 0.98  CALCIUM 8.1* 8.0*    Studies/Results: No results found.  Assessment/Plan: Patient and preop for surgical decompression and fusion however patient hematocrit known to be drop down to 7.4 this morning and anesthesia was made aware patient pacemaker so surgery is being delayed for transfusion and echocardiogram and clinical clearance. We will await CT results easily see if we can proceed forward with surgery today regardless we have extensively gone over the procedure perioperative course expectations of outcome risks and benefits and alternatives and he understands and agrees to proceed forward.  LOS: 6 days     Reida Hem P 07/05/2018, 8:58 AM

## 2018-07-05 NOTE — Progress Notes (Signed)
EKG CRITICAL VALUE  STEMI AT 11:53   12 lead EKG performed.  Critical value noted. Katie pace  , RN notified.   Ollen Bowl, CCT 07/05/2018 11:57 AM

## 2018-07-05 NOTE — Progress Notes (Addendum)
[address] Jude Rep Arlys Lenin Small [phone]) notified of patient's pending surgery. Stated he would come right over and check device.

## 2018-07-05 NOTE — Progress Notes (Signed)
Nutrition Follow Up  DOCUMENTATION CODES:   Non-severe (moderate) malnutrition in context of chronic illness  INTERVENTION:    Ensure Enlive po TID, each supplement provides 350 kcal and 20 grams of protein  NUTRITION DIAGNOSIS:   Moderate Malnutrition related to chronic illness(COPD, CHF, DM) as evidenced by energy intake < 75% for > or equal to 1 month, mild fat depletion, moderate fat depletion, mild muscle depletion, moderate muscle depletion, ongoing  GOAL:   Patient will meet greater than or equal to 90% of their needs, met  MONITOR:   PO intake, Supplement acceptance, Labs, Weight trends, Skin, I & O's  ASSESSMENT:   63 y.o. male with PMHx of  COPD, CHF, T2DM, Afib, pace maker, lumbar disc disease s/p fusion wo scented with new pneumonia as well as worsening neurological symptoms consistent with worsening spinal stenosis.    Pt discussed in progression rounds.  He was admitted with CAP & worsening spinal stenosis.  Pt is currently NPO for surgical decompression and fusion.  Pt's hematocrit dropped this AM; surgery has been delayed. Per Joellen Jersey, RN he is receiving a blood transfusion.  Prior to NPO pt was on a HH/Carbohydrate Modified diet. PO intake was excellent at 100% per flowsheet records. Drinking his Ensure Enlive nutrition supplements.  Labs & medications reviewed. CBG's [id number].  Diet Order:   Diet Order            Diet NPO time specified  Diet effective midnight             EDUCATION NEEDS:   Education needs have been addressed  Skin:  Skin Assessment: Skin Integrity Issues: Skin Integrity Issues:: Diabetic Ulcer Diabetic Ulcer: lt heel  Last BM:  3/2  Height:   Ht Readings from Last 1 Encounters:  06/29/18 _0  (1.88 m)    Weight:   Wt Readings from Last 1 Encounters:  07/03/18 94.5 kg    Ideal Body Weight:  86.4 kg  BMI:  Body mass index is 26.75 kg/m.  Estimated Nutritional Needs:   Kcal:  2300-2500  Protein:   120-135 gm  Fluid:  2.3-2.5 L  Arthur Holms, RD, LDN Pager #: [phone] After-Hours Pager #: [phone]

## 2018-07-06 DIAGNOSIS — I472 Ventricular tachycardia: Secondary | ICD-10-CM

## 2018-07-06 LAB — GLUCOSE, CAPILLARY
GLUCOSE-CAPILLARY: 87 mg/dL (ref 70–99)
GLUCOSE-CAPILLARY: 67 mg/dL — AB (ref 70–99)
Glucose-Capillary: 70 mg/dL (ref 70–99)
Glucose-Capillary: 129 mg/dL — ABNORMAL HIGH (ref 70–99)
Glucose-Capillary: 156 mg/dL — ABNORMAL HIGH (ref 70–99)
Glucose-Capillary: 151 mg/dL — ABNORMAL HIGH (ref 70–99)
Glucose-Capillary: 125 mg/dL — ABNORMAL HIGH (ref 70–99)
Glucose-Capillary: 135 mg/dL — ABNORMAL HIGH (ref 70–99)
Glucose-Capillary: 191 mg/dL — ABNORMAL HIGH (ref 70–99)
Glucose-Capillary: 220 mg/dL — ABNORMAL HIGH (ref 70–99)
Glucose-Capillary: 260 mg/dL — ABNORMAL HIGH (ref 70–99)

## 2018-07-06 LAB — COMPREHENSIVE METABOLIC PANEL
ALT: 68 U/L — ABNORMAL HIGH (ref 0–44)
AST: 45 U/L — ABNORMAL HIGH (ref 15–41)
Albumin: 1.6 g/dL — ABNORMAL LOW (ref 3.5–5.0)
Alkaline Phosphatase: 110 U/L (ref 38–126)
Anion gap: 9 (ref 5–15)
BUN: 26 mg/dL — AB (ref 8–23)
CO2: 26 mmol/L (ref 22–32)
Calcium: 8.1 mg/dL — ABNORMAL LOW (ref 8.9–10.3)
Chloride: 104 mmol/L (ref 98–111)
Creatinine, Ser: 0.9 mg/dL (ref 0.61–1.24)
GFR calc Af Amer: >60 mL/min (ref >60)
Glucose, Bld: 180 mg/dL — ABNORMAL HIGH (ref 70–99)
Potassium: 5.2 mmol/L — ABNORMAL HIGH (ref 3.5–5.1)
Sodium: 139 mmol/L (ref 135–145)
TOTAL PROTEIN: 4.6 g/dL — AB (ref 6.5–8.1)
Total Bilirubin: 0.6 mg/dL (ref 0.3–1.2)

## 2018-07-06 LAB — BASIC METABOLIC PANEL
Anion gap: 6 (ref 5–15)
Anion gap: 10 (ref 5–15)
BUN: 28 mg/dL — ABNORMAL HIGH (ref 8–23)
BUN: 28 mg/dL — ABNORMAL HIGH (ref 8–23)
CHLORIDE: 104 mmol/L (ref 98–111)
CO2: 26 mmol/L (ref 22–32)
CO2: 25 mmol/L (ref 22–32)
Calcium: 8.2 mg/dL — ABNORMAL LOW (ref 8.9–10.3)
Calcium: 8.2 mg/dL — ABNORMAL LOW (ref 8.9–10.3)
Chloride: 107 mmol/L (ref 98–111)
Creatinine, Ser: 0.95 mg/dL (ref 0.61–1.24)
Creatinine, Ser: 1.03 mg/dL (ref 0.61–1.24)
GFR calc Af Amer: >60 mL/min (ref >60)
GFR calc Af Amer: >60 mL/min (ref >60)
GFR calc non Af Amer: >60 mL/min (ref >60)
GFR calc non Af Amer: >60 mL/min (ref >60)
Glucose, Bld: 203 mg/dL — ABNORMAL HIGH (ref 70–99)
Glucose, Bld: 241 mg/dL — ABNORMAL HIGH (ref 70–99)
POTASSIUM: 4.7 mmol/L (ref 3.5–5.1)
Potassium: 4.9 mmol/L (ref 3.5–5.1)
Sodium: 139 mmol/L (ref 135–145)
Sodium: 139 mmol/L (ref 135–145)

## 2018-07-06 LAB — MAGNESIUM: Magnesium: 1.9 mg/dL (ref 1.7–2.4)

## 2018-07-06 LAB — CBC
HCT: 33.3 % — ABNORMAL LOW (ref 39.0–52.0)
Hemoglobin: 10 g/dL — ABNORMAL LOW (ref 13.0–17.0)
MCH: 25.6 pg — ABNORMAL LOW (ref 26.0–34.0)
MCHC: 30 g/dL (ref 30.0–36.0)
MCV: 85.2 fL (ref 80.0–100.0)
Platelets: 173 10*3/uL (ref 150–400)
RBC: 3.91 MIL/uL — ABNORMAL LOW (ref 4.22–5.81)
RDW: 19.2 % — ABNORMAL HIGH (ref 11.5–15.5)
WBC: 8.5 10*3/uL (ref 4.0–10.5)
nRBC: 0 % (ref 0.0–0.2)

## 2018-07-06 MED ORDER — CALCIUM GLUCONATE-NACL 1-0.675 GM/50ML-% IV SOLN: 1.0000 g | Freq: Once | INTRAVENOUS | Status: DC

## 2018-07-06 MED ORDER — INSULIN ASPART 100 UNIT/ML ~~LOC~~ SOLN
2.0000 [IU] | Freq: Three times a day (TID) | SUBCUTANEOUS | Status: DC
  Administered 2018-07-06 – 2018-07-12 (×9): 2 [IU] via SUBCUTANEOUS

## 2018-07-06 MED ORDER — OXYCODONE HCL 5 MG PO TABS
10.0000 mg | ORAL_TABLET | ORAL | Status: DC | PRN
  Administered 2018-07-06 – 2018-07-12 (×17): 10 mg via ORAL
  Filled 2018-07-06 (×19): qty 2

## 2018-07-06 MED ORDER — OXYCODONE HCL 5 MG PO TABS: 10.0000 mg | ORAL_TABLET | ORAL | Status: DC | PRN

## 2018-07-06 MED ORDER — POLYETHYLENE GLYCOL 3350 17 G PO PACK
17.0000 g | PACK | Freq: Every day | ORAL | Status: DC
  Administered 2018-07-07 – 2018-07-12 (×5): 17 g via ORAL
  Filled 2018-07-06 (×6): qty 1

## 2018-07-06 MED ORDER — INSULIN GLARGINE 100 UNIT/ML ~~LOC~~ SOLN
8.0000 [IU] | Freq: Every day | SUBCUTANEOUS | Status: DC
  Administered 2018-07-06 – 2018-07-12 (×7): 8 [IU] via SUBCUTANEOUS
  Filled 2018-07-06 (×7): qty 0.08

## 2018-07-06 MED ORDER — KETOROLAC TROMETHAMINE 30 MG/ML IJ SOLN
30.0000 mg | Freq: Four times a day (QID) | INTRAMUSCULAR | Status: DC
  Administered 2018-07-06 – 2018-07-07 (×4): 30 mg via INTRAVENOUS
  Filled 2018-07-06 (×4): qty 1

## 2018-07-06 MED ORDER — METOPROLOL SUCCINATE ER 50 MG PO TB24
50.0000 mg | ORAL_TABLET | Freq: Two times a day (BID) | ORAL | Status: DC
  Administered 2018-07-06 – 2018-07-09 (×7): 50 mg via ORAL
  Filled 2018-07-06 (×7): qty 1

## 2018-07-06 MED ORDER — ENOXAPARIN SODIUM 40 MG/0.4ML ~~LOC~~ SOLN
40.0000 mg | SUBCUTANEOUS | Status: DC
  Administered 2018-07-06 – 2018-07-12 (×6): 40 mg via SUBCUTANEOUS
  Filled 2018-07-06 (×6): qty 0.4

## 2018-07-06 MED ORDER — OXYCODONE HCL 5 MG PO TABS: 10.0000 mg | ORAL_TABLET | Freq: Four times a day (QID) | ORAL | Status: DC

## 2018-07-06 NOTE — Progress Notes (Signed)
OT Cancellation Note  Patient Details Name: Henry Mcpherson MRN: [id number] DOB: 08-23-1955   Cancelled Treatment:    Reason Eval/Treat Not Completed: Medical issues which prohibited therapy - pt with abnormal EKG, and brace has not yet been delivered.  Will reattempt.  Jeani Hawking, OTR/L Acute Rehabilitation Services Pager [phone] Office [phone]  Jeani Hawking M 07/06/2018, 12:27 PM

## 2018-07-06 NOTE — Progress Notes (Signed)
IP rehab admissions - I met with patient and his wife.  Awaiting arrival of brace so that PT/OT evaluations can be completed.  I will then open the case to request acute inpatient rehab admission.  I will follow up again in am.  Call me for questions.  [phone]

## 2018-07-06 NOTE — Progress Notes (Addendum)
PT Cancellation Note  Patient Details Name: Henry Mcpherson MRN: [id number] DOB: January 25, 1956   Cancelled Treatment:    Reason Eval/Treat Not Completed: Other (comment)(await brace prior to mobility and confirmed need for brace with Dr.Cram 0800)  2nd attempt at 1215 still without brace with abnormal EKG and will hold at this time  Pressley Tadesse B Aviyon Hocevar 07/06/2018, 7:59 AM Josseline Reddin Abner Greenspan, PT Acute Rehabilitation Services Pager: [phone] Office: [phone]

## 2018-07-06 NOTE — Progress Notes (Signed)
..  EKG CRITICAL VALUE     12 lead EKG performed.  Critical value noted.  Gabriel Cirri, RN notified.   Abbe Amsterdam, CCT 07/06/2018 11:16 AM

## 2018-07-06 NOTE — Anesthesia Postprocedure Evaluation (Signed)
Anesthesia Post Note  Patient: Laquintin Atkison  Procedure(s) Performed: Lumbar three-Sacral one Decompressive Lumbar Laminectomy and Fusion (N/A Back)     Patient location during evaluation: PACU Anesthesia Type: General Level of consciousness: awake and alert Pain management: pain level controlled Vital Signs Assessment: post-procedure vital signs reviewed and stable Respiratory status: spontaneous breathing, nonlabored ventilation, respiratory function stable and patient connected to nasal cannula oxygen Cardiovascular status: blood pressure returned to baseline and stable Postop Assessment: no apparent nausea or vomiting Anesthetic complications: no    Last Vitals:  Vitals:   07/05/18 2230 07/05/18 2245  BP: 137/82   Pulse: 98   Resp: 12   Temp:  (!) 36.3 C  SpO2: 100%     Last Pain:  Vitals:   07/06/18 0016  TempSrc:   PainSc: 10-Worst pain ever                 Richel Millspaugh COKER

## 2018-07-06 NOTE — Progress Notes (Signed)
Orthopedic Tech Progress Note Patient Details:  Deanglo Trumbauer 1955-09-07 [phone] RN said patient had preorder brace and should be dropped off today sometime Patient ID: [name], male   DOB: June 20, 1955, 63 y.o.   MRN: [id number]   Donald Pore 07/06/2018, 8:14 AM

## 2018-07-06 NOTE — Progress Notes (Signed)
Orthopedic Tech Progress Note Patient Details:  Henry Mcpherson January 10, 1956 [id number]  Patient ID: [name], male   DOB: [birth year], 63 y.o.   MRN: [id number] Called in order to bio tech.  Trinna Post 07/06/2018, 3:05 PM

## 2018-07-06 NOTE — Progress Notes (Signed)
Subjective: Patient reports doing much better significant improvement lower extremity pain and numbness  Objective: Vital signs in last 24 hours: Temp:  [97.2 F (36.2 C)-98.6 F (37 C)] 97.9 F (36.6 C) (03/05 0734) Pulse Rate:  [63-106] 105 (03/05 0734) Resp:  [11-20] 13 (03/05 0734) BP: (110-149)/(38-87) 110/87 (03/05 0734) SpO2:  [93 %-100 %] 97 % (03/05 0734) Arterial Line BP: (158-166)/(68-73) 166/68 (03/04 2230)  Intake/Output from previous day: 03/04 0701 - 03/05 0700 In: 3853 [I.V.:2403; Blood:1250; IV Piggyback:200] Out: 2000 [Urine:1350; Drains:300; Blood:350] Intake/Output this shift: No intake/output data recorded.  maintains with right-sided foot drop but improved left looks to me function improved pain incision clean dry and intact  Lab Results: Recent Labs    07/05/18 0249  07/05/18 1948 07/06/18 0500  WBC 7.4  --   --  8.5  HGB 7.4*   < > 10.2* 10.0*  HCT 25.5*   < > 30.0* 33.3*  PLT 162  --   --  173   < > = values in this interval not displayed.   BMET Recent Labs    07/05/18 0249  07/05/18 1948 07/06/18 0500  NA 140   < > 139 139  K 5.3*   < > 5.0 5.2*  CL 109  --   --  104  CO2 25  --   --  26  GLUCOSE 124*  --  109* 180*  BUN 35*  --   --  26*  CREATININE 0.98  --   --  0.90  CALCIUM 8.0*  --   --  8.1*   < > = values in this interval not displayed.    Studies/Results: Dg Lumbar Spine 2-3 Views  Result Date: 07/05/2018 CLINICAL DATA:  Lumbar laminectomy and fusion EXAM: DG C-ARM 61-120 MIN; LUMBAR SPINE - 2-3 VIEW COMPARISON:  CT 06/27/2027 FINDINGS: Two low resolution intraoperative spot views of the lumbar spine. Total fluoroscopy time was 1 minutes 11 seconds. AP view demonstrates multiple fixating screws within the lower lumbar vertebra. Sagittal view demonstrates multiple fixating screws at L3, L4, L5 and S1. IMPRESSION: Intraoperative fluoroscopic assistance provided during lumbar spine surgery Electronically Signed   By: Jasmine Pang M.D.   On: 07/05/2018 21:50   Dg C-arm 1-60 Min  Result Date: 07/05/2018 CLINICAL DATA:  Lumbar laminectomy and fusion EXAM: DG C-ARM 61-120 MIN; LUMBAR SPINE - 2-3 VIEW COMPARISON:  CT 06/27/2027 FINDINGS: Two low resolution intraoperative spot views of the lumbar spine. Total fluoroscopy time was 1 minutes 11 seconds. AP view demonstrates multiple fixating screws within the lower lumbar vertebra. Sagittal view demonstrates multiple fixating screws at L3, L4, L5 and S1. IMPRESSION: Intraoperative fluoroscopic assistance provided during lumbar spine surgery Electronically Signed   By: Jasmine Pang M.D.   On: 07/05/2018 21:50    Assessment/Plan: Mobilized today with physical occupational therapy  LOS: 7 days     Reylene Stauder P 07/06/2018, 7:53 AM

## 2018-07-06 NOTE — Social Work (Signed)
CSW acknowledging consult for SNF placement. Will follow for therapy recommendations. Aware pt is from [place].   Octavio Graves, MSW, [facility] Clinical Social Work [phone]

## 2018-07-06 NOTE — Progress Notes (Signed)
Pt. Had a run of v tach, notified by telemetry. MD has also ordered a 12 lead EKG around the same time, but unaware of run of vtach, Critical results of EKG called to Dr Sherilyn Dacosta. She returned page and will follow up. Pt is currently asymptomatic. Wife is in room with patient. Currently Afib rate of 106. Gabriel Cirri RN

## 2018-07-06 NOTE — Progress Notes (Signed)
Subjective: Patient was seen and evaluated at bedside on morning rounds. No acute events overnight. He complaines of back pain at site of surgery.  He denies any chest pain, shortness of breath.  Coughing has been improved.  He mentions that he was able to eat and drink well without nausea or vomiting after surgery.  Objective:  Vital signs in last 24 hours: Vitals:   07/05/18 2215 07/05/18 2230 07/05/18 2245 07/06/18 0400  BP: 131/85 137/82  116/80  Pulse: 97 98  (!) 102  Resp: 15 12  11   Temp:   (!) 97.3 F (36.3 C) 97.9 F (36.6 C)  TempSrc:    Oral  SpO2: 100% 100%  93%  Weight:      Height:       Physical exam: General: Well-developed gentleman, lying in the bed in no acute distress but looks ill. Head and neck: Eyes: Extraocular movements are normal CV: Borderline tachycardia, normal rhythm, no murmur Pulmonary exam: Clear to auscultation on anterior and lateral chest exam Abdomen: Is soft and nontender to palpation, BS are present Extremities: Patient can move his feet, SCD is in place, no lower extremity edema  Assessment/Plan:  Principal Problem:   Cauda equina syndrome (HCC) Active Problems:   Community acquired pneumonia   Chronic obstructive lung disease (HCC)   Controlled type 2 diabetes mellitus without complication, without long-term current use of insulin (HCC)   Spinal stenosis of lumbar region   Spinal stenosis at L4-L5 level  Mr. Henry Mcpherson is a 63 yo M with PMHx of CHF, chronic a-fib, with pace maker, type II DM, lumbar stenosis who was sent to the ED by his neurosurgeon for worsening low back pain, associated lower extremity weakness, numbness, and fecal incontinence. Preoperatively found to have PNA. Admitted for treatment and optimization prior to surgery.   Cauda Equina Syndrome: Status post decompressive laminectomy yesterday (07/05/2018.) PO day 1.  , has tolerated p.o. intake very well, however he complaines of pain at site of surgery when his pain med  is delayed. On exam, Patient is hemodynamically stable.  Can move his feet bilaterally. Neurosurgery is following. We will continue pain management.  We schedule Toradol and continue as needed medications for breakthrough pain.  -PO diet -Continue Toradol 30 q6h -Continue Oxycodone IR 10 mg q 4h PRN &tylenol 5 mg q6h prn  -Continue Dilaudid 0.5-1 mg q4h breakthrough pain -Continue Senokot-S BID and Miralax PRN for opioid-induced constipation -Resuming Lovenox for VTE prophylaxis tonight   Normocytis anemia: (S/p 2 unit transfusion yesterday 07/05/2018 before surgery, due to hemoglobin 7.3) -Can be due to chronic disease -Check ferritin and reticulocyte (will consider that he got transfusion yesterday, when interpreting the results) -CBC daily -Transfusion goal hemoglobin more than 7  Transaminitis: Unclear etiology. Can be due to previous hypotension. Improved today. Unclear etiology.AST: 45, ALT 68 (was 107 and 101 yesterday)  -CMP tomorrow -Will monitor  Comiunity acquired pneumonia: Treated with 5-day course of IV ceftriaxone and azithromycin. Cough improved. Remained afebrile and w/o any leukocytosis. He has Hx of COPD. W/o evidence of exacerbation on this admission. He is getting every 4 h DuoNeb.   -Continue DuoNeb every 4 hours  Insulin-dependent diabetes type 2: Decreased Lantus to 4 unit (instead of 8) prior to surgery and held mealtime insulin due to being n.p.o. Will put back on usual dose now that he is PO.   -Continue sensitive SSI -Put back on 8 units of glargine at bedtime. -Holding NovoLog 2 units 3 times  daily (May resume todAY based on CBGs)   Hyperkalemia: s/p a oxalate yesterday due to potassium of 5.3.  Mag and phosphorus were normal.  Potassium it came down to 5 yesterday.  Kayexalate, today his potassium is again 5.2.    -EKG today -BMP daily -Cardiac monitoring  Diet: N.p.o. for surgery IV fluid: None VTE ppx: Lovenox Code status: Full  code Dispo: Anticipated discharge in approximately 2-3 days  Chevis Pretty, MD 07/06/2018, 7:31 AM Pager: [phone]

## 2018-07-07 LAB — CBC
HCT: 25.6 % — ABNORMAL LOW (ref 39.0–52.0)
HCT: 25.6 % — ABNORMAL LOW (ref 39.0–52.0)
Hemoglobin: 7.6 g/dL — ABNORMAL LOW (ref 13.0–17.0)
Hemoglobin: 7.6 g/dL — ABNORMAL LOW (ref 13.0–17.0)
MCH: 25.5 pg — ABNORMAL LOW (ref 26.0–34.0)
MCH: 25.4 pg — ABNORMAL LOW (ref 26.0–34.0)
MCHC: 29.7 g/dL — ABNORMAL LOW (ref 30.0–36.0)
MCHC: 29.7 g/dL — AB (ref 30.0–36.0)
MCV: 85.9 fL (ref 80.0–100.0)
MCV: 85.6 fL (ref 80.0–100.0)
Platelets: 172 10*3/uL (ref 150–400)
Platelets: 195 10*3/uL (ref 150–400)
RBC: 2.98 MIL/uL — ABNORMAL LOW (ref 4.22–5.81)
RBC: 2.99 MIL/uL — ABNORMAL LOW (ref 4.22–5.81)
RDW: 19 % — ABNORMAL HIGH (ref 11.5–15.5)
RDW: 18.8 % — ABNORMAL HIGH (ref 11.5–15.5)
WBC: 9.8 10*3/uL (ref 4.0–10.5)
WBC: 7.8 10*3/uL (ref 4.0–10.5)
nRBC: 0 % (ref 0.0–0.2)
nRBC: 0 % (ref 0.0–0.2)

## 2018-07-07 LAB — COMPREHENSIVE METABOLIC PANEL
ALT: 51 U/L — ABNORMAL HIGH (ref 0–44)
AST: 62 U/L — ABNORMAL HIGH (ref 15–41)
Albumin: 1.4 g/dL — ABNORMAL LOW (ref 3.5–5.0)
Alkaline Phosphatase: 87 U/L (ref 38–126)
Anion gap: 5 (ref 5–15)
BUN: 32 mg/dL — ABNORMAL HIGH (ref 8–23)
CO2: 28 mmol/L (ref 22–32)
Calcium: 8 mg/dL — ABNORMAL LOW (ref 8.9–10.3)
Chloride: 105 mmol/L (ref 98–111)
Creatinine, Ser: 0.97 mg/dL (ref 0.61–1.24)
GFR calc Af Amer: >60 mL/min (ref >60)
GFR calc non Af Amer: >60 mL/min (ref >60)
Glucose, Bld: 123 mg/dL — ABNORMAL HIGH (ref 70–99)
Potassium: 4.7 mmol/L (ref 3.5–5.1)
SODIUM: 138 mmol/L (ref 135–145)
Total Bilirubin: 0.5 mg/dL (ref 0.3–1.2)
Total Protein: 3.8 g/dL — ABNORMAL LOW (ref 6.5–8.1)

## 2018-07-07 LAB — GLUCOSE, CAPILLARY
Glucose-Capillary: 129 mg/dL — ABNORMAL HIGH (ref 70–99)
Glucose-Capillary: 126 mg/dL — ABNORMAL HIGH (ref 70–99)
Glucose-Capillary: 196 mg/dL — ABNORMAL HIGH (ref 70–99)
Glucose-Capillary: 252 mg/dL — ABNORMAL HIGH (ref 70–99)

## 2018-07-07 LAB — RETICULOCYTES
Immature Retic Fract: 34.1 % — ABNORMAL HIGH (ref 2.3–15.9)
RBC.: 2.97 MIL/uL — ABNORMAL LOW (ref 4.22–5.81)
RETIC COUNT ABSOLUTE: 33 10*3/uL (ref 19.0–186.0)
Retic Ct Pct: 1.1 % (ref 0.4–3.1)

## 2018-07-07 MED ORDER — KETOROLAC TROMETHAMINE 30 MG/ML IJ SOLN
30.0000 mg | Freq: Four times a day (QID) | INTRAMUSCULAR | Status: AC | PRN
  Administered 2018-07-10: 30 mg via INTRAVENOUS
  Filled 2018-07-07 (×2): qty 1

## 2018-07-07 MED ORDER — FUROSEMIDE 40 MG PO TABS
40.0000 mg | ORAL_TABLET | Freq: Once | ORAL | Status: AC
  Administered 2018-07-07: 40 mg via ORAL
  Filled 2018-07-07: qty 1

## 2018-07-07 MED ORDER — PANTOPRAZOLE SODIUM 40 MG PO TBEC
40.0000 mg | DELAYED_RELEASE_TABLET | Freq: Every day | ORAL | Status: DC
  Administered 2018-07-07 – 2018-07-12 (×6): 40 mg via ORAL
  Filled 2018-07-07 (×6): qty 1

## 2018-07-07 MED ORDER — FUROSEMIDE 40 MG PO TABS
40.0000 mg | ORAL_TABLET | Freq: Every day | ORAL | Status: DC
  Administered 2018-07-08 – 2018-07-13 (×6): 40 mg via ORAL
  Filled 2018-07-07 (×6): qty 1

## 2018-07-07 MED ORDER — HYDROMORPHONE HCL 1 MG/ML IJ SOLN
0.5000 mg | INTRAMUSCULAR | Status: DC | PRN
  Administered 2018-07-07 (×3): 0.5 mg via INTRAVENOUS
  Filled 2018-07-07 (×2): qty 1

## 2018-07-07 MED FILL — Heparin Sodium (Porcine) Inj 1000 Unit/ML: INTRAMUSCULAR | Qty: 30 | Status: AC

## 2018-07-07 MED FILL — Sodium Chloride IV Soln 0.9%: INTRAVENOUS | Qty: 1000 | Status: AC

## 2018-07-07 NOTE — Progress Notes (Signed)
Subjective: Patient was seen and evaluated at bedside on morning rounds. No acute events overnight. He endorses that his pain is better than yesterday.  He denies any chest pain, shortness of breath or palpitation yesterday or today (specifically mentions that he was symptom free yesterday, when told to be tachycardic).  He has tolerated PO diet well after surgery.  Objective:  Vital signs in last 24 hours: Vitals:   07/06/18 1136 07/06/18 2227 07/07/18 0000 07/07/18 0346  BP: 126/75 116/86 102/72   Pulse: (!) 103 (!) 106 (!) 101   Resp: 18  (!) 22 20  Temp: 98.6 F (37 C)  97.8 F (36.6 C) 98.7 F (37.1 C)  TempSrc: Oral  Oral Oral  SpO2:   100%   Weight:      Height:      Physical Exam Constitutional:      General: He is not in acute distress.    Appearance: He is ill-appearing.  HENT:     Head: Normocephalic and atraumatic.  Eyes:     Extraocular Movements: Extraocular movements intact.  Cardiovascular:     Rate and Rhythm: Tachycardia present. Rhythm irregular.     Pulses: Normal pulses.     Heart sounds: No murmur.  Pulmonary:     Effort: Pulmonary effort is normal.     Breath sounds: Normal breath sounds.  Abdominal:     General: There is no distension.     Palpations: Abdomen is soft.     Tenderness: There is no abdominal tenderness. There is no guarding.  Neurological:     Mental Status: He is alert and oriented to person, place, and time. Can move his upper extremities and his feet.  Psychiatric:        Mood and Affect: Mood normal.        Behavior: Behavior normal.    Assessment/Plan:  Principal Problem:   Cauda equina syndrome (HCC) Active Problems:   Community acquired pneumonia   Chronic obstructive lung disease (HCC)   Controlled type 2 diabetes mellitus without complication, without long-term current use of insulin (HCC)   Spinal stenosis of lumbar region   Spinal stenosis at L4-L5 level  Henry Mcpherson is a 63 yo M withPMHx of CHF, chronic  a-fib,with pace maker,type II DM, lumbar stenosis who was sent to the ED by his neurosurgeon for worsening low back pain,associated lower extremity weakness, numbness, and fecal incontinence. Preoperatively found to have PNA. Admitted for treatment and optimization prior to surgery.   Cauda Equina Syndrome: Status post decompressive laminectomy yesterday (07/05/2018.) PO day 2, Pain is better. On exam, Patient is hemodynamically stable.  Can move his feet bilaterally. Neurosurgery is following.  Will taper IV Dilaudid for transition to PO pain medication before sending to rehab center.  -Toradol 30 q 6h PRN -ContinueOxycodone IR 10 mg q 4h PRN &tylenol 5 mg q6h prn  -DecreaseDilaudid to 0.5 mg q4h PRN for break through pain -Continue Senokot-S BID and Miralax PRN foropioid-induced constipation -Resuming Lovenox for VTE prophylaxis tonight   Normocytis anemia:  (S/p 2 unit transfusion yesterday 07/05/2018 before surgery, due to hemoglobin 7.3) Hb now is at 7.6 Retic 1.1. Retic index is low. It fits with anemia of chronic illness vs iron deficincy.  -CBC daily -Transfusion goal hemoglobin more than 7 -f/u Ferritin (will consider that he got transfusion yesterday, when interpreting the results)  Transaminitis: Unclear etiology. Improved. AST: 62, ALT: 51. Total bili and alkaline phosphatase are normal  -CMP tomorrow -Will monitor  Comiunity acquired pneumonia: Treated with 5-day course of IV ceftriaxone and azithromycin. No more symptoms.   COPD: stable -Continue DuoNeb every 4 hours  Insulin-dependent diabetes type 2:  -Continue sensitive SSI -Continue 8 units of glargine at bedtime. -Continue NovoLog 2 units 3 times daily   Hyperkalemia:s/p a oxalate yesterday due to potassium of 5.3 07/05/18 Normalized at K:4.7   -BMP daily -Cardiac monitoring  Diet:HH Carb modified  IV fluid:None VTE VEL:FYBOFBP Code status:Full code   Dispo: Anticipated  discharge in approximately 3-4 days  Chevis Pretty, MD 07/07/2018, 6:39 AM Pager: [phone]

## 2018-07-07 NOTE — Evaluation (Signed)
Physical Therapy Evaluation Patient Details Name: Henry Mcpherson MRN: [id number] DOB: [birth year] Today's Date: 07/07/2018   History of Present Illness  63 yo male s/p L3-S1 redo compressive lumbar laminectomies, foraminotomies L3-S1 nerve roots, nonsegmental fixation L3-S1 and posterior lateral arthrodesis L3-S1 after progressive LBP, LE weakness and fecal incontinence. PMH: lumbar surgery, HTN, DM, COPD, CHF  Clinical Impression  Pt in bed upon arrival and agreed to participate with therapy. Eval initiated yesterday with education provided but brace not yet available. Pt unable to recall precautions that were discussed yesterday and pt educated on precautions and donning/doffing spinal brace. Pt inconsistent from yesterday with his reports of PLOF before admission. Pt requires max A x2 for all mobility. Pt would benefit from skilled physical therapy to improve strength and functional mobility to decrease caregiver burden and improve safety. Pt educated on plan to follow acutely and recommendation of skilled rehab after discharge.     Follow Up Recommendations SNF;Supervision/Assistance - 24 hour    Equipment Recommendations  Rolling walker with 5" wheels    Recommendations for Other Services OT consult     Precautions / Restrictions Precautions Precautions: Back Precaution Booklet Issued: Yes (comment) Precaution Comments: pt unable to recall precautions from yesterday Required Braces or Orthoses: Spinal Brace Spinal Brace: Lumbar corset;Applied in sitting position Restrictions Weight Bearing Restrictions: No      Mobility  Bed Mobility Overal bed mobility: Needs Assistance Bed Mobility: Rolling;Sidelying to Sit Rolling: Max assist;+2 for physical assistance Sidelying to sit: Max assist;+2 for physical assistance       General bed mobility comments: pt required max A x2 for bending legs for rolling and getting off EOB, max A for trunk control to sit EOB, pt rolls better  with hooking arm rather than reaching   Transfers Overall transfer level: Needs assistance Equipment used: Rolling walker (2 wheeled) Transfers: Sit to/from Stand Sit to Stand: Max assist;+2 physical assistance;From elevated surface         General transfer comment: pt max A x2 to attempt standing x2 trials, pt unable to fully stand up even with x2 assist, pt required cuing for hand placement . pt clearing sacrum but maintaing trunk flexion and unable to assist with bil UE to extend trunk  Ambulation/Gait             General Gait Details: unable  Stairs            Wheelchair Mobility    Modified Rankin (Stroke Patients Only)       Balance Overall balance assessment: Needs assistance Sitting-balance support: Bilateral upper extremity supported Sitting balance-Leahy Scale: Poor Sitting balance - Comments: pt required UE support to sit EOB, pt required total A for donning spinal brace in sitting    Standing balance support: Bilateral upper extremity supported Standing balance-Leahy Scale: Zero                               Pertinent Vitals/Pain Pain Assessment: 0-10 Pain Score: 8  Pain Location: low back Pain Intervention(s): Monitored during session;Limited activity within patient's tolerance;Premedicated before session    Home Living Family/patient expects to be discharged to:: Private residence Living Arrangements: Spouse/significant other Available Help at Discharge: Available 24 hours/day;Family Type of Home: House Home Access: Stairs to enter Entrance Stairs-Rails: Can reach both Entrance Stairs-Number of Steps: 4 Home Layout: One level Home Equipment: Walker - 2 wheels;Gilmer Mor - single point      Prior Function  Level of Independence: Needs assistance         Comments: pt reported he needs help occasionally getting dressed on 3/5, on 3/6 pt reports his wife helped him get in and out of bed, he was using a RW and only recently started  using WC     Hand Dominance   Dominant Hand: Right    Extremity/Trunk Assessment   Upper Extremity Assessment Upper Extremity Assessment: Defer to OT evaluation    Lower Extremity Assessment Lower Extremity Assessment: (decreased strength in Bilateral LEs)    Cervical / Trunk Assessment Cervical / Trunk Assessment: Kyphotic(post surgical )  Communication      Cognition Arousal/Alertness: Awake/alert Behavior During Therapy: WFL for tasks assessed/performed Overall Cognitive Status: Within Functional Limits for tasks assessed                                        General Comments      Exercises     Assessment/Plan    PT Assessment Patient needs continued PT services  PT Problem List Decreased strength;Decreased coordination;Decreased range of motion;Decreased activity tolerance;Decreased knowledge of use of DME;Decreased balance;Decreased safety awareness;Decreased mobility;Decreased knowledge of precautions;Decreased skin integrity       PT Treatment Interventions DME instruction;Therapeutic exercise;Balance training;Gait training;Neuromuscular re-education;Stair training;Functional mobility training;Cognitive remediation;Therapeutic activities;Patient/family education    PT Goals (Current goals can be found in the Care Plan section)  Acute Rehab PT Goals Patient Stated Goal: decrease pain PT Goal Formulation: With patient Time For Goal Achievement: 07/14/18 Potential to Achieve Goals: Good    Frequency Min 5X/week   Barriers to discharge Decreased caregiver support      Co-evaluation               AM-PAC PT "6 Clicks" Mobility  Outcome Measure Help needed turning from your back to your side while in a flat bed without using bedrails?: Total Help needed moving from lying on your back to sitting on the side of a flat bed without using bedrails?: Total Help needed moving to and from a bed to a chair (including a wheelchair)?:  Total Help needed standing up from a chair using your arms (e.g., wheelchair or bedside chair)?: Total Help needed to walk in hospital room?: Total Help needed climbing 3-5 steps with a railing? : Total 6 Click Score: 6    End of Session Equipment Utilized During Treatment: Gait belt;Back brace Activity Tolerance: Patient limited by pain Patient left: in bed;with call bell/phone within reach;with bed alarm set Nurse Communication: Mobility status PT Visit Diagnosis: Muscle weakness (generalized) (M62.81);Other symptoms and signs involving the nervous system (R29.898);Other abnormalities of gait and mobility (R26.89)    Time: 9470-9628 PT Time Calculation (min) (ACUTE ONLY): 24 min   Charges:   PT Evaluation $PT Eval Moderate Complexity: 1 Mod PT Treatments $Therapeutic Activity: 8-22 mins       Inella Kuwahara, [place] [phone]   Mazzie Brodrick 07/07/2018, 10:07 AM

## 2018-07-07 NOTE — Social Work (Addendum)
12:12pm- Pt and pt wife still both prefer CIR, Genie will continue to follow.  11:38am- Aware pt recommended for SNF- spoke with Genie at [facility]. She will meet with pt, will begin to arrange SNF. Pt with managed insurance and from [place], placement will be dependent on insurance approval for SNF.  Octavio Graves, MSW, [facility] Clinical Social Work [phone]

## 2018-07-07 NOTE — Evaluation (Addendum)
Occupational Therapy Evaluation Patient Details Name: Henry Mcpherson MRN: [id number] DOB: 07/01/1955 Today's Date: 07/07/2018    History of Present Illness 63 yo male s/p L3-S1 redo compressive lumbar laminectomies, foraminotomies L3-S1 nerve roots, nonsegmental fixation L3-S1 and posterior lateral arthrodesis L3-S1 after progressive LBP, LE weakness and fecal incontinence. PMH: lumbar surgery, HTN, DM, COPD, CHF   Clinical Impression   This 63 y/o male presents with the above. Pt reports functional decline in mobility and ADL status prior to this hospital admission, though difficult to obtain full detail as pt demonstrating some confusion and unable to provide clear answers to questions. Just prior to this admission pt was using w/c for some mobility, suspect spouse was assisting some with ADL. Pt currently presents with significant weakness and pain impacting his functional performance. He currently requires two person assist for all aspects of mobility; requires setup assist for simple grooming ADL at bed level and requires max-totalA for UB/LB ADL. Pt will benefit from continued acute OT services and recommend follow up therapy services in SNF setting after discharge to maximize his safety and independence with ADL and mobility. Pt verbalizing motivation to return to PLOF. Will follow.     Follow Up Recommendations  SNF;Supervision/Assistance - 24 hour    Equipment Recommendations  Other (comment)(defer to next venue)           Precautions / Restrictions Precautions Precautions: Back Precaution Booklet Issued: Yes (comment) Precaution Comments: pt able to recall 2/3 precautions Spinal Brace: Lumbar corset;Applied in sitting position Restrictions Weight Bearing Restrictions: No      Mobility Bed Mobility Overal bed mobility: Needs Assistance             General bed mobility comments: per PT pt requires +2 assist for safe completion of bed mobility   Transfers                       Balance                                           ADL either performed or assessed with clinical judgement   ADL Overall ADL's : Needs assistance/impaired Eating/Feeding: Set up;Bed level   Grooming: Wash/dry face;Set up;Bed level   Upper Body Bathing: Moderate assistance;Bed level                             General ADL Comments: pt currently requires heavy +2 assist for all aspect of mobility including bed mobility; currently requires max-totalA for LB ADL; initiated education regarding AE and compensatory strategies for performing ADL given current back precautions      Vision         Perception     Praxis      Pertinent Vitals/Pain Pain Assessment: Faces Faces Pain Scale: Hurts even more Pain Location: low back, R shoulder with ROM Pain Descriptors / Indicators: Discomfort;Grimacing;Sore Pain Intervention(s): Monitored during session;Repositioned     Hand Dominance Right   Extremity/Trunk Assessment Upper Extremity Assessment Upper Extremity Assessment: RUE deficits/detail;Generalized weakness RUE Deficits / Details: baseline shoulder pain, only able to tolerate PROM to approx 85*; increased edema noted throughout UE   Lower Extremity Assessment Lower Extremity Assessment: Defer to PT evaluation       Communication Communication Communication: No difficulties   Cognition Arousal/Alertness: Awake/alert Behavior During Therapy:  WFL for tasks assessed/performed Overall Cognitive Status: Impaired/Different from baseline Area of Impairment: Safety/judgement;Awareness;Memory;Problem solving                     Memory: Decreased short-term memory;Decreased recall of precautions   Safety/Judgement: Decreased awareness of deficits   Problem Solving: Slow processing General Comments: pt often with fluctuating answers when attempting to obtain PLOF   General Comments       Exercises     Shoulder  Instructions      Home Living Family/patient expects to be discharged to:: Private residence Living Arrangements: Spouse/significant other Available Help at Discharge: Available 24 hours/day;Family Type of Home: House Home Access: Stairs to enter [employer] of Steps: 4 Entrance Stairs-Rails: Can reach both Home Layout: One level     Bathroom Shower/Tub: Producer, television/film/video: Handicapped height     Home Equipment: Environmental consultant - 2 wheels;Cane - single point;Shower seat - built Designer, fashion/clothing: Reacher;Sock aid        Prior Functioning/Environment Level of Independence: Needs assistance        Comments: per PT notes, pt reported he needs help occasionally getting dressed on 3/5, on 3/6 pt reports his wife helped him get in and out of bed, he was using a RW and only recently started using WC; pt with fluctuating answers        OT Problem List: Decreased strength;Decreased range of motion;Decreased activity tolerance;Impaired balance (sitting and/or standing);Pain;Impaired UE functional use;Decreased knowledge of precautions;Decreased knowledge of use of DME or AE      OT Treatment/Interventions: Self-care/ADL training;Therapeutic exercise;Neuromuscular education;Energy conservation;DME and/or AE instruction;Therapeutic activities;Patient/family education;Balance training    OT Goals(Current goals can be found in the care plan section) Acute Rehab OT Goals Patient Stated Goal: regain independence OT Goal Formulation: With patient Time For Goal Achievement: 07/21/18 Potential to Achieve Goals: Good  OT Frequency: Min 2X/week   Barriers to D/C:            Co-evaluation              AM-PAC OT "6 Clicks" Daily Activity     Outcome Measure Help from another person eating meals?: A Little Help from another person taking care of personal grooming?: A Little Help from another person toileting, which includes using toliet,  bedpan, or urinal?: Total Help from another person bathing (including washing, rinsing, drying)?: A Lot Help from another person to put on and taking off regular upper body clothing?: A Lot Help from another person to put on and taking off regular lower body clothing?: Total 6 Click Score: 12   End of Session Nurse Communication: Mobility status  Activity Tolerance: Patient tolerated treatment well Patient left: in bed;with call bell/phone within reach;with bed alarm set  OT Visit Diagnosis: Muscle weakness (generalized) (M62.81)                Time: 0623-7628 OT Time Calculation (min): 20 min Charges:  OT General Charges $OT Visit: 1 Visit OT Evaluation $OT Eval Moderate Complexity: 1 Mod  Marcy Siren, OT [employer] Pager [phone] Office [phone]   Orlando Penner 07/07/2018, 2:26 PM

## 2018-07-07 NOTE — Progress Notes (Signed)
Subjective: Patient reports patient well with significantly improved looks to me pain and function back pain well-controlled  Objective: Vital signs in last 24 hours: Temp:  [97.8 F (36.6 C)-98.7 F (37.1 C)] 97.9 F (36.6 C) (03/06 1212) Pulse Rate:  [101-106] 103 (03/06 0847) Resp:  [20-22] 21 (03/06 0800) BP: (102-123)/(62-86) 123/62 (03/06 1212) SpO2:  [99 %-100 %] 100 % (03/06 1212) FiO2 (%):  [99 %] 99 % (03/05 2039) Weight:  [101.2 kg] 101.2 kg (03/06 1300)  Intake/Output from previous day: 03/05 0701 - 03/06 0700 In: 246 [P.O.:240; I.V.:6] Out: 290 [Drains:290] Intake/Output this shift: Total I/O In: 360 [P.O.:360] Out: -   remains with her per assist left leg strength recovering more rapidly the right incisions clean dry and intact  Lab Results: Recent Labs    07/06/18 2100 07/07/18 0540  WBC 9.8 7.8  HGB 7.6* 7.6*  HCT 25.6* 25.6*  PLT 172 195   BMET Recent Labs    07/06/18 1459 07/07/18 0540  NA 139 138  K 4.7 4.7  CL 104 105  CO2 25 28  GLUCOSE 241* 123*  BUN 28* 32*  CREATININE 1.03 0.97  CALCIUM 8.2* 8.0*    Studies/Results: Dg Lumbar Spine 2-3 Views  Result Date: 07/05/2018 CLINICAL DATA:  Lumbar laminectomy and fusion EXAM: DG C-ARM 61-120 MIN; LUMBAR SPINE - 2-3 VIEW COMPARISON:  CT 06/27/2027 FINDINGS: Two low resolution intraoperative spot views of the lumbar spine. Total fluoroscopy time was 1 minutes 11 seconds. AP view demonstrates multiple fixating screws within the lower lumbar vertebra. Sagittal view demonstrates multiple fixating screws at L3, L4, L5 and S1. IMPRESSION: Intraoperative fluoroscopic assistance provided during lumbar spine surgery Electronically Signed   By: Jasmine Pang M.D.   On: 07/05/2018 21:50   Dg C-arm 1-60 Min  Result Date: 07/05/2018 CLINICAL DATA:  Lumbar laminectomy and fusion EXAM: DG C-ARM 61-120 MIN; LUMBAR SPINE - 2-3 VIEW COMPARISON:  CT 06/27/2027 FINDINGS: Two low resolution intraoperative spot views  of the lumbar spine. Total fluoroscopy time was 1 minutes 11 seconds. AP view demonstrates multiple fixating screws within the lower lumbar vertebra. Sagittal view demonstrates multiple fixating screws at L3, L4, L5 and S1. IMPRESSION: Intraoperative fluoroscopic assistance provided during lumbar spine surgery Electronically Signed   By: Jasmine Pang M.D.   On: 07/05/2018 21:50    Assessment/Plan: Postop day 2 posterior lumbar fusion decompression been consult was a progress we'll take out his Hemovac continue to mobilize with physical occupational therapy work on placement  LOS: 8 days     Rahmel Nedved P 07/07/2018, 4:22 PM

## 2018-07-08 DIAGNOSIS — M7989 Other specified soft tissue disorders: Secondary | ICD-10-CM

## 2018-07-08 DIAGNOSIS — I495 Sick sinus syndrome: Secondary | ICD-10-CM

## 2018-07-08 DIAGNOSIS — E440 Moderate protein-calorie malnutrition: Secondary | ICD-10-CM

## 2018-07-08 LAB — CBC
HCT: 27.9 % — ABNORMAL LOW (ref 39.0–52.0)
Hemoglobin: 8.3 g/dL — ABNORMAL LOW (ref 13.0–17.0)
MCH: 25.3 pg — ABNORMAL LOW (ref 26.0–34.0)
MCHC: 29.7 g/dL — AB (ref 30.0–36.0)
MCV: 85.1 fL (ref 80.0–100.0)
Platelets: 207 10*3/uL (ref 150–400)
RBC: 3.28 MIL/uL — ABNORMAL LOW (ref 4.22–5.81)
RDW: 18.6 % — ABNORMAL HIGH (ref 11.5–15.5)
WBC: 8.7 10*3/uL (ref 4.0–10.5)
nRBC: 0 % (ref 0.0–0.2)

## 2018-07-08 LAB — URINALYSIS, ROUTINE W REFLEX MICROSCOPIC
Bacteria, UA: NONE SEEN
Bilirubin Urine: NEGATIVE
Glucose, UA: NEGATIVE mg/dL
Ketones, ur: NEGATIVE mg/dL
Nitrite: NEGATIVE
PH: 6 (ref 5.0–8.0)
Protein, ur: NEGATIVE mg/dL
Specific Gravity, Urine: 1.011 (ref 1.005–1.030)

## 2018-07-08 LAB — COMPREHENSIVE METABOLIC PANEL
ALT: 89 U/L — ABNORMAL HIGH (ref 0–44)
ANION GAP: 11 (ref 5–15)
AST: 118 U/L — ABNORMAL HIGH (ref 15–41)
Albumin: 1.7 g/dL — ABNORMAL LOW (ref 3.5–5.0)
Alkaline Phosphatase: 98 U/L (ref 38–126)
BUN: 25 mg/dL — ABNORMAL HIGH (ref 8–23)
CALCIUM: 8.3 mg/dL — AB (ref 8.9–10.3)
CO2: 24 mmol/L (ref 22–32)
Chloride: 101 mmol/L (ref 98–111)
Creatinine, Ser: 0.85 mg/dL (ref 0.61–1.24)
GFR calc non Af Amer: >60 mL/min (ref >60)
Glucose, Bld: 143 mg/dL — ABNORMAL HIGH (ref 70–99)
Potassium: 4.6 mmol/L (ref 3.5–5.1)
Sodium: 136 mmol/L (ref 135–145)
Total Bilirubin: 0.6 mg/dL (ref 0.3–1.2)
Total Protein: 4.8 g/dL — ABNORMAL LOW (ref 6.5–8.1)

## 2018-07-08 LAB — GLUCOSE, CAPILLARY
GLUCOSE-CAPILLARY: 148 mg/dL — AB (ref 70–99)
Glucose-Capillary: 137 mg/dL — ABNORMAL HIGH (ref 70–99)
Glucose-Capillary: 183 mg/dL — ABNORMAL HIGH (ref 70–99)
Glucose-Capillary: 170 mg/dL — ABNORMAL HIGH (ref 70–99)

## 2018-07-08 LAB — FERRITIN: FERRITIN: 197 ng/mL (ref 24–336)

## 2018-07-08 NOTE — Progress Notes (Signed)
Pt alert with periods of confusion noted, frequent c/o back pain pain medication  administer with some effect/relief.Edema noted on scrotal area and upper and lower extremities.

## 2018-07-08 NOTE — Progress Notes (Signed)
Physical Therapy Treatment Patient Details Name: Henry Mcpherson MRN: [id number] DOB: [birth year] Today's Date: 07/08/2018    History of Present Illness Pt is a 63 yo male s/p L3-S1 redo compressive lumbar laminectomies, foraminotomies L3-S1 nerve roots, nonsegmental fixation L3-S1 and posterior lateral arthrodesis L3-S1 after progressive LBP, LE weakness and fecal incontinence. PMH: lumbar surgery, HTN, DM, COPD, CHF    PT Comments    Pt very limited this session secondary to pain and cognitive deficits. Pt confused throughout with tangential thought processing. Only able to tolerate bed mobility with max A x2. All VSS throughout. Pt would continue to benefit from skilled physical therapy services at this time while admitted and after d/c to address the below listed limitations in order to improve overall safety and independence with functional mobility.    Follow Up Recommendations  SNF;Supervision/Assistance - 24 hour     Equipment Recommendations  Rolling walker with 5" wheels    Recommendations for Other Services       Precautions / Restrictions Precautions Precautions: Back Precaution Comments: pt very confused throughout with tangential thoughts/speech; unable to appropriately educate this session Required Braces or Orthoses: Spinal Brace Spinal Brace: Lumbar corset;Applied in sitting position Restrictions Weight Bearing Restrictions: No    Mobility  Bed Mobility Overal bed mobility: Needs Assistance Bed Mobility: Rolling;Sidelying to Sit;Sit to Sidelying Rolling: Max assist;+2 for physical assistance Sidelying to sit: Max assist;+2 for physical assistance     Sit to sidelying: Max assist;+2 for physical assistance General bed mobility comments: increased time and effort, multimodal cueing for log roll technique, heavy physical assistance of two with all aspects  Transfers                 General transfer comment: deferred - pt only able to tolerated bed  mobility and sitting EOB for a few seconds  Ambulation/Gait                 Stairs             Wheelchair Mobility    Modified Rankin (Stroke Patients Only)       Balance Overall balance assessment: Needs assistance Sitting-balance support: Bilateral upper extremity supported Sitting balance-Leahy Scale: Poor                                      Cognition Arousal/Alertness: Awake/alert Behavior During Therapy: Restless Overall Cognitive Status: Impaired/Different from baseline Area of Impairment: Orientation;Attention;Memory;Following commands;Awareness;Safety/judgement;Problem solving                 Orientation Level: Disoriented to;Place;Situation Current Attention Level: Sustained Memory: Decreased short-term memory;Decreased recall of precautions Following Commands: Follows one step commands inconsistently Safety/Judgement: Decreased awareness of deficits;Decreased awareness of safety Awareness: Intellectual Problem Solving: Slow processing;Decreased initiation;Difficulty sequencing;Requires verbal cues        Exercises      General Comments        Pertinent Vitals/Pain Pain Assessment: Faces Faces Pain Scale: Hurts whole lot Pain Location: back Pain Descriptors / Indicators: Discomfort;Grimacing;Sore Pain Intervention(s): Monitored during session;Repositioned    Home Living                      Prior Function            PT Goals (current goals can now be found in the care plan section) Acute Rehab PT Goals PT Goal Formulation: With patient Time For  Goal Achievement: 07/14/18 Potential to Achieve Goals: Fair Progress towards PT goals: Progressing toward goals    Frequency    Min 5X/week      PT Plan Current plan remains appropriate    Co-evaluation              AM-PAC PT "6 Clicks" Mobility   Outcome Measure  Help needed turning from your back to your side while in a flat bed without  using bedrails?: Total Help needed moving from lying on your back to sitting on the side of a flat bed without using bedrails?: Total Help needed moving to and from a bed to a chair (including a wheelchair)?: Total Help needed standing up from a chair using your arms (e.g., wheelchair or bedside chair)?: Total Help needed to walk in hospital room?: Total Help needed climbing 3-5 steps with a railing? : Total 6 Click Score: 6    End of Session   Activity Tolerance: Patient limited by pain Patient left: in bed;with call bell/phone within reach;with bed alarm set Nurse Communication: Mobility status PT Visit Diagnosis: Muscle weakness (generalized) (M62.81);Other symptoms and signs involving the nervous system (R29.898);Other abnormalities of gait and mobility (R26.89)     Time: 7209-4709 PT Time Calculation (min) (ACUTE ONLY): 16 min  Charges:  $Therapeutic Activity: 8-22 mins                     Deborah Chalk, PT, DPT  Acute Rehabilitation Services Pager [phone] Office [phone]     Henry Mcpherson January 07/08/2018, 1:17 PM

## 2018-07-08 NOTE — Discharge Summary (Addendum)
Name: Henry Mcpherson MRN: [id number] DOB: 07-14-1955 63 y.o. PCP: System, Pcp Not In  Date of Admission: 06/29/2018 12:39 PM Date of Discharge: 07/13/2018 Attending Physician: Axel Filler, *  Discharge Diagnosis: 1. Principal Problem:   Cauda equina syndrome (HCC) Active Problems:   Community acquired pneumonia   Chronic obstructive lung disease ([place])   Controlled type 2 diabetes mellitus without complication, without long-term current use of insulin ([place])   Spinal stenosis of lumbar region   Spinal stenosis at L4-L5 level   Malnutrition of moderate degree   Discharge Medications: Allergies as of 07/13/2018   No Known Allergies     Medication List    STOP taking these medications   Levemir FlexTouch 100 UNIT/ML Pen Generic drug:  Insulin Detemir   zolpidem 10 MG tablet Commonly known as:  AMBIEN     TAKE these medications   acetaminophen 325 MG tablet Commonly known as:  TYLENOL Take 2 tablets (650 mg total) by mouth every 4 (four) hours as needed for mild pain ((score 1 to 3) or temp > 100.5). What changed:    medication strength  how much to take  when to take this  reasons to take this   albuterol 108 (90 Base) MCG/ACT inhaler Commonly known as:  PROVENTIL HFA;VENTOLIN HFA Inhale 1-2 puffs into the lungs every 6 (six) hours as needed for wheezing or shortness of breath.   atorvastatin 40 MG tablet Commonly known as:  LIPITOR Take 40 mg by mouth daily.   cyclobenzaprine 10 MG tablet Commonly known as:  FLEXERIL Take 1 tablet (10 mg total) by mouth 2 (two) times daily as needed for muscle spasms.   digoxin 0.125 MG tablet Commonly known as:  LANOXIN Take 0.125 mg by mouth daily.   furosemide 40 MG tablet Commonly known as:  LASIX Take 40 mg by mouth daily.   metoprolol succinate 100 MG 24 hr tablet Commonly known as:  TOPROL-XL Take 100 mg by mouth 2 (two) times daily.   nicotine 14 mg/24hr patch Commonly known as:  NICODERM CQ  - dosed in mg/24 hours Place 1 patch (14 mg total) onto the skin daily.   Oxycodone HCl 10 MG Tabs Take 1 tablet (10 mg total) by mouth every 6 (six) hours as needed for up to 5 days for severe pain or breakthrough pain.   pantoprazole 40 MG tablet Commonly known as:  PROTONIX Take 40 mg by mouth 2 (two) times daily.   PROBIOTIC DAILY PO Take 1 capsule by mouth daily.   senna-docusate 8.6-50 MG tablet Commonly known as:  Senokot-S Take 1 tablet by mouth 2 (two) times daily as needed for mild constipation.       Disposition and follow-up:   Mr.Henry Mcpherson was discharged from [facility] in Stable condition.  At the hospital follow up visit please address:  1.  Patient presented with cauda equina syndrome secondary to worsening of spinal canal stenosis.  He underwent decompression surgery.  He is discharged to SNF. Please assist with rehab. -Please evaluate surgery incision site. and make sure patient follows up with neurosurgery.  2. Patient found to have pneumonia on preop evaluation and got IV antibiotics x 5 days. Resolved on discharge. Please evaluate for respiratory symptoms.  3. Patient with normocytic anemia. Received 2 units of PRBC before surgery. Please repeat CBC and consider GI work up.  4. Patient home meds record showed he was on 25-30 u of Lantus. He was on  8 u of lantus at bedtime and 2 units TID and SSI at hospital. We held Insulin at discharge due to hypoglycemia at 42. Please reevaluate and adjust the Insulin as needed.   5.  Labs / imaging needed at time of follow-up: CMP, CBC  6.  Pending labs/ test needing follow-up: Ferritin  Follow-up Appointments:  Please make sure to get an appointment and see your PCP within next week. and follow up with your primary care for your diabetes control and with your neurosurgeon for post op evaluation.   Hospital Course by problem list: 1. Cauda Equina Syndrome: 63 year old male with PMHx of lumbar  disc disease status post multilevel decompressive laminectomy 13 years ago, COPD, CHF, A. Fib, tachybrady syndrome, on pace maker, DM 2, sent to ED from his neurosurgeon office for worsening of low back pain, developing sensorimotor deficit, fecal incontinency. He underwentlumbar decompression and fusion surgery at 07/05/2018 .  No complication after surgery except some delirium that resolved at discharge. His ambulation has been an issue despite daily PT/OT session. He is discharged to SNF for further rehab.  2. CAP: He found to have pneumonia on pre-op evaluation. He was afebrile but had leukocytosis at 14000. Chest x-ray with evidence of pneumonia. Flu test was negative. Received IV Ceftriaxon and Azithromycin x 7 days to treat CAP. He improved with the treatment. And is asymptomatic at discharge, without leukocytosis.  3.COPD: No COPD exacerbation during this hospitalization. He was given DUoneb nebulizar q 6h. His symptoms were controled. No supplemental O2 required.   4. CAD status post CABG CHF (no further information on chart) Afib, pace maker. Patient had some bilateral lower extremity edema. No CHF exacerbation. Due to soft BP, Metoprolol and Lasix initially held and then resumed during admission. He has been on Digoxin at home and it was continued during this admission.  Home meds:  Metoprolol succinate: 100 mg BID Digoxin: 0.125 mg QD Lasix 20 mg QD No change to home meds at discharge.  5. Transamininites: Patient with mild transaminitis during this admission. No clear etiology, likely due to post surgical hypotension. Alk P and bili were normal and patient remained asymptomatic. Significantly improved at discharge.  6. Chronic Normocytis anemia:No evidence of bleeding during this admission.  He received 2 units of packed cell at 3/4/2020before surgery,due to hemoglobin 7.3. With low Retic index. It is likely due to anemia of chronic illness vs iron deficincy. Ferritin is  pending at discharge. Recommending out patient work up. Hb remained stable at discharge: 8.3   7. DM: Patient home meds record showed he was on 25-30 u of Lantus. He was on 8 u of lantus at bedtime and 2 units TID and SSI at hospital. We held Insulin at discharge due to hypoglycemia at 42 until he will be evaluated at follow up visit.  Discharge Vitals:   BP 112/65 (BP Location: Left Arm)   Pulse (!) 103   Temp 97.6 F (36.4 C) (Oral)   Resp 16   Ht 6' 2"  (1.88 m)   Wt 96.4 kg   SpO2 98%   BMI 27.29 kg/m   Pertinent Labs, Studies, and Procedures:   CXR: 06/29/2018: Right lower lobe airspace disease most consistent with pneumonia.  US abdomen: 07/10/2018 IMPRESSION: Coarse echogenicity of the liver usually associated with hepatic steatosis or fibrosis.  Borderline thickening of the gallbladder wall, nonspecific in the absence of other signs of acute cholecystitis.  Bilateral renal cortical thinning, usually associated with medical renal disease.  Two  benign-appearing right renal cysts, the larger measuring 2.5 cm. CBC Latest Ref Rng & Units 07/13/2018 07/12/2018 07/11/2018  WBC 4.0 - 10.5 K/uL 15.6(H) 9.3 10.9(H)  Hemoglobin 13.0 - 17.0 g/dL 8.4(L) 8.7(L) 8.3(L)  Hematocrit 39.0 - 52.0 % 28.0(L) 30.3(L) 28.3(L)  Platelets 150 - 400 K/uL 403(H) 306 303   CMP Latest Ref Rng & Units 07/13/2018 07/12/2018 07/11/2018  Glucose 70 - 99 mg/dL 42(LL) 103(H) 98  BUN 8 - 23 mg/dL 15 17 20   Creatinine 0.61 - 1.24 mg/dL 0.70 0.76 0.71  Sodium 135 - 145 mmol/L 141 141 138  Potassium 3.5 - 5.1 mmol/L 3.4(L) 3.6 3.4(L)  Chloride 98 - 111 mmol/L 105 104 100  CO2 22 - 32 mmol/L 30 28 27   Calcium 8.9 - 10.3 mg/dL 8.2(L) 8.4(L) 8.3(L)  Total Protein 6.5 - 8.1 g/dL 4.9(L) 4.6(L) 4.5(L)  Total Bilirubin 0.3 - 1.2 mg/dL 0.5 0.6 0.5  Alkaline Phos 38 - 126 U/L 91 90 94  AST 15 - 41 U/L 31 40 70(H)  ALT 0 - 44 U/L 50(H) 63(H) 82(H)    Discharge Instructions: Discharge Instructions    Call MD  for:   Complete by:  As directed    Sever back pain, fever, weakness,..   Diet - low sodium heart healthy   Complete by:  As directed    Discharge instructions   Complete by:  As directed    Thank you for allowing [place] taking care of you at [facility]. We are glad that you are doing well after surgery. We [facility] you to [facility] facility for rehab. Please follow up with your primary care for your diabetes control and with your neurosurgeon.  If you have any question, you can call us at [phone] Thank you   Increase activity slowly   Complete by:  As directed      We held your Insulin dose based on your sugar value in the hospital. Please make sure to get an appointment and see your PCP within next week to follow up with your primary care for your diabetes control.  SignedDewayne Hatch, MD  07/13/2018 8:13 AM  Pager: [phone]

## 2018-07-08 NOTE — Progress Notes (Signed)
Overall stable.  Patient states that his pain is well controlled.  He is not having any lower extremity pain.  He does feel that his lower extremities are moving better.  Neurological exam stable.  Dressing clean and dry.  Abdomen soft.  Status post multilevel lumbar decompression and fusion.  Continue efforts at mobilization.

## 2018-07-08 NOTE — Progress Notes (Signed)
Subjective: Patient was seen and evaluated at bedside on morning rounds. No acute events overnight. Still reports post surgery back pain but overally feels better. No chest pain, no n/v and no new symptoms. Per nursing note, he had episode of confusion during the night and also noted to have swelling of upper and lower extremities.  He does not remember if he was confused but is oriented now.  Objective:  Vital signs in last 24 hours: Vitals:   07/07/18 2100 07/07/18 2300 07/08/18 0300 07/08/18 0327  BP: 115/73   136/85  Pulse: 98   (!) 101  Resp: 18   18  Temp: 98.6 F (37 C) 98.8 F (37.1 C) 98.5 F (36.9 C)   TempSrc: Oral Oral Oral   SpO2: 93%   97%  Weight:      Height:        Physical Exam Vitals signs and nursing note reviewed.  HENT:     Head: Normocephalic and atraumatic.  Cardiovascular:     Pulses: Normal pulses.     Heart sounds: Normal heart sounds. No murmur.     Comments: Borderline tachycardia Pulmonary:  Pulmonary effort is normal.  Breath sounds: Crackle at lat and ant chest exam improved but present Abdominal:  Abdomen is soft. There is no abdominal tenderness.  Musculoskeletal:     No LEE. Right arm swelling. Non tender. No cyanosis or erythema. Hand movement and pulses are normal.   Neurological:     Mental Status: He is alert and oriented to person, place, and time. Mental status is at baseline. Can move all extremities. Psychiatric:     affect and mood are normal.     Behavior and thought content are normal.   Assessment/Plan:  Principal Problem:   Cauda equina syndrome (HCC) Active Problems:   Community acquired pneumonia   Chronic obstructive lung disease (HCC)   Controlled type 2 diabetes mellitus without complication, without long-term current use of insulin (HCC)   Spinal stenosis of lumbar region   Spinal stenosis at L4-L5 level     Mr.Poynter is a 63 yo M withPMHx of CHF, chronic a-fib,with pace maker,type II DM, lumbar  stenosis who was sent to the ED by his neurosurgeon for worsening low back pain,associated lower extremity weakness, numbness, and fecal incontinence. Preoperatively found to have PNA. Admitted for treatment and optimization prior to surgery.   Cauda Equina Syndrome:Status post decompressivelaminectomyyesterday(07/05/2018.) S/p lumbar fusion decompression Postop day 3 Overly looks better but still has unctroled pain  On exam, Patient is hemodynamically stable.Can move his legs and feet bilaterally.  He is on Oxycodone 10 mg q4h, dilaudid  0.5 mg q 4h and Toradol prn for pain. Plan has been to wean IV dilaudid, how ever I do not change his pain med today due to still having ncontrolled pain today.  -Toradol 30 q 6h PRN -ContinueOxycodoneIR 10 mg q 4h PRN&tylenol 5 mg q6h prn  -Continue Dilaudid to 0.5 mg q4h PRN for break through pain -Continue Senokot-S BIDand Miralax PRNforopioid-induced constipation -PT/OT for ambulation -Neurosurgery is following. We appreciate recommendations  Right upper extremity swelling: Mild to moderate. No pain. No erythema, no cyanosis and no tenderness. Radial pulse is normal. Hand movement is intact. Likely due to volume overload.   -Switch BP cuff to left hand -Will monitor and reevaluate daily -Continue home Lasix   Normocytis anemia:stable. Hb now is at 8.3 (S/p 2 unit transfusion at 3/4/2020before surgery,due to hemoglobin 7.3)  Retic 1.1. Retic index is  low. It fits with anemia of chronic illness vs iron deficincy.  -CBC daily -Transfusion goal hemoglobin more than7 -Ferritin pending. F/u the result (will consider that he got transfusion yesterday, when interpreting the results)  Transaminitis:Unclear etiology.  No abdominal pain or tenderness on exam. Total bili and alkaline phosphatase are normal Monitoring.  -CMP daily -Will monitor  Hx A-fib, s/p pace maker Tachy brady syndrome  Asymptomatic. borderline  tachycardia today - Continue metoprolol at 50 mg BID - Continue digoxin  - I&Os -Continue cardiac monitoring  COPD: stable -Continue DuoNeb every 4 hours  Insulin-dependent diabetes type 2:  -Continue sensitive SSI -Continue 8 units ofglargine at bedtime. -Continue NovoLog 2 units 3 times daily Dispo: Anticipated discharge to SNF in approximately 2-3 day.  Chevis Pretty, MD 07/08/2018, 5:31 AM Pager: [phone]

## 2018-07-08 NOTE — Plan of Care (Signed)

## 2018-07-09 DIAGNOSIS — Z8679 Personal history of other diseases of the circulatory system: Secondary | ICD-10-CM

## 2018-07-09 LAB — BPAM RBC
BLOOD PRODUCT EXPIRATION DATE: 202004012359
BLOOD PRODUCT EXPIRATION DATE: 202004012359
Blood Product Expiration Date: 202004012359
Blood Product Expiration Date: 202004022359
Blood Product Expiration Date: 202004022359
Blood Product Expiration Date: 202004042359
ISSUE DATE / TIME: 202003041218
ISSUE DATE / TIME: 202003041507
ISSUE DATE / TIME: 202003041735
ISSUE DATE / TIME: 202003041735
ISSUE DATE / TIME: 202003040916
UNIT TYPE AND RH: 5100
Unit Type and Rh: 5100
Unit Type and Rh: 5100
Unit Type and Rh: 5100
Unit Type and Rh: 5100
Unit Type and Rh: 5100

## 2018-07-09 LAB — GLUCOSE, CAPILLARY
GLUCOSE-CAPILLARY: 142 mg/dL — AB (ref 70–99)
Glucose-Capillary: 150 mg/dL — ABNORMAL HIGH (ref 70–99)
Glucose-Capillary: 70 mg/dL (ref 70–99)
Glucose-Capillary: 134 mg/dL — ABNORMAL HIGH (ref 70–99)

## 2018-07-09 LAB — CBC
HCT: 27.5 % — ABNORMAL LOW (ref 39.0–52.0)
Hemoglobin: 8.3 g/dL — ABNORMAL LOW (ref 13.0–17.0)
MCH: 25.4 pg — ABNORMAL LOW (ref 26.0–34.0)
MCHC: 30.2 g/dL (ref 30.0–36.0)
MCV: 84.1 fL (ref 80.0–100.0)
Platelets: 245 10*3/uL (ref 150–400)
RBC: 3.27 MIL/uL — ABNORMAL LOW (ref 4.22–5.81)
RDW: 18.5 % — ABNORMAL HIGH (ref 11.5–15.5)
WBC: 11.4 10*3/uL — AB (ref 4.0–10.5)
nRBC: 0 % (ref 0.0–0.2)

## 2018-07-09 LAB — TYPE AND SCREEN
ABO/RH(D): O POS
Antibody Screen: NEGATIVE
UNIT DIVISION: 0
UNIT DIVISION: 0
UNIT DIVISION: 0
Unit division: 0
Unit division: 0
Unit division: 0

## 2018-07-09 LAB — COMPREHENSIVE METABOLIC PANEL
ALT: 63 U/L — ABNORMAL HIGH (ref 0–44)
AST: 46 U/L — ABNORMAL HIGH (ref 15–41)
Albumin: 1.8 g/dL — ABNORMAL LOW (ref 3.5–5.0)
Alkaline Phosphatase: 91 U/L (ref 38–126)
Anion gap: 11 (ref 5–15)
BUN: 19 mg/dL (ref 8–23)
CHLORIDE: 102 mmol/L (ref 98–111)
CO2: 25 mmol/L (ref 22–32)
Calcium: 8.6 mg/dL — ABNORMAL LOW (ref 8.9–10.3)
Creatinine, Ser: 0.68 mg/dL (ref 0.61–1.24)
GFR calc Af Amer: >60 mL/min (ref >60)
GFR calc non Af Amer: >60 mL/min (ref >60)
Glucose, Bld: 162 mg/dL — ABNORMAL HIGH (ref 70–99)
Potassium: 4.2 mmol/L (ref 3.5–5.1)
Sodium: 138 mmol/L (ref 135–145)
Total Bilirubin: 0.4 mg/dL (ref 0.3–1.2)
Total Protein: 4.8 g/dL — ABNORMAL LOW (ref 6.5–8.1)

## 2018-07-09 MED ORDER — HYDROMORPHONE HCL 1 MG/ML IJ SOLN
0.5000 mg | Freq: Four times a day (QID) | INTRAMUSCULAR | Status: DC | PRN
  Administered 2018-07-11 – 2018-07-12 (×3): 0.5 mg via INTRAVENOUS
  Filled 2018-07-09 (×4): qty 1

## 2018-07-09 NOTE — Progress Notes (Signed)
   Subjective: Disoriented this morning. Thinks he is "at home in [place]". Patient was able to be re oriented. Continues to have significant pain. Says he did get out of bed yesterday.   Objective:  Vital signs in last 24 hours: Vitals:   07/08/18 2116 07/08/18 2300 07/09/18 0004 07/09/18 0418  BP: (!) 118/93  134/76 134/78  Pulse: 96  (!) 58 99  Resp:   18 18  Temp:  (!) 97.5 F (36.4 C)  98.2 F (36.8 C)  TempSrc:  Oral  Oral  SpO2:   100% 95%  Weight:      Height:       Physical Exam Constitutional: Alert but disoriented, NAD Cardiovascular: RRR, no murmurs, rubs, or gallops.  Pulmonary/Chest: CTAB, no wheezes, rales, or rhonchi.  Abdominal: Soft, non tender, non distended. +BS.  Extremities: Warm and well perfused. 1+ edema   Assessment/Plan:  Principal Problem:   Cauda equina syndrome (HCC) Active Problems:   Community acquired pneumonia   Chronic obstructive lung disease (HCC)   Controlled type 2 diabetes mellitus without complication, without long-term current use of insulin (HCC)   Spinal stenosis of lumbar region   Spinal stenosis at L4-L5 level   Malnutrition of moderate degree  Cauda Equina Syndrome: POD 4 from multilevel lumbar decompression and fusion. Pain control and mobilization continue to be issues. Attempting to wean IV dilaudid for discharge.  -- Decrease IV dilaudid 0.5 mg q4 -> q6h PRN -- Continue oxycodone 10 mg q4h PRN  -- Toradol 30 mg IV q6h prn  -- Out of bed to chair today; order placed -- PT / OT -- Medically stable to discharge once patient is on an oral regimen for pain control   Delirium: Unfortunately patient is now intermittently confused, pulling at lines. Thought he was at home today but was able to be re oriented.  -- Delirium precautions  -- Minimize sedation as much as possible  -- Discontinue ambien  -- Day / night re orientation   Tachybrady Syndrome s/p pacemaker  Hx A-fib  CHF -- Continue home metoprolol &  digoxin  -- Continue home lasix 40 mg daily  -- Telemetry   FEN: No fluids, replete lytes prn, heart healthy / carb mod VTE ppx: Lovenox  Code Status: FULL    Dispo: Anticipated discharge in approximately 1-2 day(s).   Reymundo Poll, MD 07/09/2018, 7:40 AM Pager: [phone]

## 2018-07-09 NOTE — Progress Notes (Signed)
Pt remain alert with periods of confusion. Remain awake during this shift ,occasionally pulling on lines, needs constant reminders/reorientation of environment. Tylenol given for c/o mild headache.Extremities and scrotal area remain edematous

## 2018-07-09 NOTE — Progress Notes (Signed)
Neurosurgery Service Progress Note  Subjective: No acute events overnight.    Objective: Vitals:   07/08/18 2300 07/09/18 0004 07/09/18 0418 07/09/18 0912  BP:  134/76 134/78 119/81  Pulse:  (!) 58 99 (!) 105  Resp:  18 18 20   Temp: (!) 97.5 F (36.4 C)  98.2 F (36.8 C) 98.1 F (36.7 C)  TempSrc: Oral  Oral Oral  SpO2:  100% 95% 97%  Weight:      Height:       Temp (24hrs), Avg:98 F (36.7 C), Min:97.5 F (36.4 C), Max:98.3 F (36.8 C)  CBC Latest Ref Rng & Units 07/08/2018 07/07/2018 07/06/2018  WBC 4.0 - 10.5 K/uL 8.7 7.8 9.8  Hemoglobin 13.0 - 17.0 g/dL 8.3(L) 7.6(L) 7.6(L)  Hematocrit 39.0 - 52.0 % 27.9(L) 25.6(L) 25.6(L)  Platelets 150 - 400 K/uL 207 195 172   BMP Latest Ref Rng & Units 07/09/2018 07/08/2018 07/07/2018  Glucose 70 - 99 mg/dL 161(W) 960(A) 540(J)  BUN 8 - 23 mg/dL 19 81(X) 91(Y)  Creatinine 0.61 - 1.24 mg/dL 7.82 9.56 2.13  Sodium 135 - 145 mmol/L 138 136 138  Potassium 3.5 - 5.1 mmol/L 4.2 4.6 4.7  Chloride 98 - 111 mmol/L 102 101 105  CO2 22 - 32 mmol/L 25 24 28   Calcium 8.9 - 10.3 mg/dL 0.8(M) 8.3(L) 8.0(L)    Intake/Output Summary (Last 24 hours) at 07/09/2018 1010 Last data filed at 07/08/2018 2359 Gross per 24 hour  Intake 3 ml  Output 850 ml  Net -847 ml    Current Facility-Administered Medications:  .  0.9 %  sodium chloride infusion (Manually program via Guardrails IV Fluids), , Intravenous, Once, Donalee Citrin, MD .  0.9 %  sodium chloride infusion, 250 mL, Intravenous, Continuous, Donalee Citrin, MD .  acetaminophen (TYLENOL) tablet 650 mg, 650 mg, Oral, Q4H PRN, 650 mg at 07/09/18 0351 **OR** acetaminophen (TYLENOL) suppository 650 mg, 650 mg, Rectal, Q4H PRN, Donalee Citrin, MD .  alum & mag hydroxide-simeth (MAALOX/MYLANTA) 200-200-20 MG/5ML suspension 30 mL, 30 mL, Oral, Q6H PRN, Donalee Citrin, MD .  atorvastatin (LIPITOR) tablet 40 mg, 40 mg, Oral, Daily, Donalee Citrin, MD, 40 mg at 07/08/18 5784 .  cyclobenzaprine (FLEXERIL) tablet 10 mg, 10 mg, Oral, TID  PRN, Donalee Citrin, MD, 10 mg at 07/08/18 1439 .  diclofenac sodium (VOLTAREN) 1 % transdermal gel 2 g, 2 g, Topical, QID PRN, Donalee Citrin, MD, 2 g at 07/04/18 1000 .  digoxin (LANOXIN) tablet 0.125 mg, 0.125 mg, Oral, Daily, Donalee Citrin, MD, 0.125 mg at 07/08/18 0813 .  enoxaparin (LOVENOX) injection 40 mg, 40 mg, Subcutaneous, Q24H, Masoudi, Elhamalsadat, MD, 40 mg at 07/07/18 1440 .  feeding supplement (ENSURE ENLIVE) (ENSURE ENLIVE) liquid 237 mL, 237 mL, Oral, TID BM, Donalee Citrin, MD, 237 mL at 07/08/18 1959 .  furosemide (LASIX) tablet 40 mg, 40 mg, Oral, Daily, Masoudi, Elhamalsadat, MD, 40 mg at 07/08/18 0813 .  hydrALAZINE (APRESOLINE) injection 2 mg, 2 mg, Intravenous, Q6H PRN, Donalee Citrin, MD .  HYDROmorphone (DILAUDID) injection 0.5 mg, 0.5 mg, Intravenous, Q6H PRN, Rehman, Areeg N, DO .  insulin aspart (novoLOG) injection 0-5 Units, 0-5 Units, Subcutaneous, Carma Lair, MD, 100 Units at 07/07/18 2122 .  insulin aspart (novoLOG) injection 0-9 Units, 0-9 Units, Subcutaneous, TID WC, Donalee Citrin, MD, 1 Units at 07/08/18 [phone] .  insulin aspart (novoLOG) injection 2 Units, 2 Units, Subcutaneous, TID WC, Masoudi, Elhamalsadat, MD, 2 Units at 07/08/18 0839 .  insulin glargine (LANTUS) injection  8 Units, 8 Units, Subcutaneous, QHS, Masoudi, Elhamalsadat, MD, 8 Units at 07/08/18 2202 .  ipratropium-albuterol (DUONEB) 0.5-2.5 (3) MG/3ML nebulizer solution 3 mL, 3 mL, Nebulization, BID, Donalee Citrin, MD, 3 mL at 07/08/18 2106 .  ketorolac (TORADOL) 30 MG/ML injection 30 mg, 30 mg, Intravenous, Q6H PRN, Masoudi, Elhamalsadat, MD .  lidocaine (LIDODERM) 5 % 1 patch, 1 patch, Transdermal, Q24H, Donalee Citrin, MD, 1 patch at 07/08/18 [phone] .  menthol-cetylpyridinium (CEPACOL) lozenge 3 mg, 1 lozenge, Oral, PRN **OR** phenol (CHLORASEPTIC) mouth spray 1 spray, 1 spray, Mouth/Throat, PRN, Donalee Citrin, MD .  metoprolol succinate (TOPROL-XL) 24 hr tablet 50 mg, 50 mg, Oral, BID, Masoudi, Elhamalsadat, MD, 50 mg at  07/08/18 2116 .  multivitamin with minerals tablet 1 tablet, 1 tablet, Oral, Daily, Donalee Citrin, MD, 1 tablet at 07/08/18 [phone] .  mupirocin ointment (BACTROBAN) 2 % 1 application, 1 application, Nasal, BID, Donalee Citrin, MD, 1 application at 07/08/18 2202 .  naphazoline-glycerin (CLEAR EYES REDNESS) ophth solution 1 drop, 1 drop, Both Eyes, QID PRN, Donalee Citrin, MD, 1 drop at 07/05/18 1053 .  ondansetron (ZOFRAN) tablet 4 mg, 4 mg, Oral, Q6H PRN **OR** ondansetron (ZOFRAN) injection 4 mg, 4 mg, Intravenous, Q6H PRN, Donalee Citrin, MD .  oxyCODONE (Oxy IR/ROXICODONE) immediate release tablet 10 mg, 10 mg, Oral, Q4H PRN, Masoudi, Elhamalsadat, MD, 10 mg at 07/08/18 1004 .  pantoprazole (PROTONIX) EC tablet 40 mg, 40 mg, Oral, Daily, Tyson Alias, MD, 40 mg at 07/08/18 1959 .  polyethylene glycol (MIRALAX / GLYCOLAX) packet 17 g, 17 g, Oral, Daily, Rehman, Areeg N, DO, 17 g at 07/08/18 0811 .  senna-docusate (Senokot-S) tablet 1 tablet, 1 tablet, Oral, BID, Donalee Citrin, MD, 1 tablet at 07/08/18 2116 .  sodium chloride flush (NS) 0.9 % injection 3 mL, 3 mL, Intravenous, Q12H, Donalee Citrin, MD, 3 mL at 07/08/18 2359 .  sodium chloride flush (NS) 0.9 % injection 3 mL, 3 mL, Intravenous, PRN, Donalee Citrin, MD, 3 mL at 07/06/18 1645   Physical Exam: Awake/alert, confused - asking when he's going to surgery Strength 5/5 in BUE BLE proximally 4-/5, distally 4/5 on L and 3/5 on R but having some L/R confusion that I think is making the L seem worse than it is, SILTx4  Assessment & Plan: 63 y.o. man s/p cauda equina syndrome s/p decompression and instrumented fusion. -no change in neurosurgical plan of care  Jadene Pierini  07/09/18 10:10 AM

## 2018-07-10 ENCOUNTER — Inpatient Hospital Stay (HOSPITAL_COMMUNITY): Payer: BLUE CROSS/BLUE SHIELD

## 2018-07-10 DIAGNOSIS — R410 Disorientation, unspecified: Secondary | ICD-10-CM

## 2018-07-10 LAB — GLUCOSE, CAPILLARY
Glucose-Capillary: 91 mg/dL (ref 70–99)
Glucose-Capillary: 96 mg/dL (ref 70–99)
Glucose-Capillary: 147 mg/dL — ABNORMAL HIGH (ref 70–99)
Glucose-Capillary: 230 mg/dL — ABNORMAL HIGH (ref 70–99)

## 2018-07-10 LAB — COMPREHENSIVE METABOLIC PANEL
ALT: 68 U/L — AB (ref 0–44)
AST: 73 U/L — ABNORMAL HIGH (ref 15–41)
Albumin: 1.6 g/dL — ABNORMAL LOW (ref 3.5–5.0)
Alkaline Phosphatase: 89 U/L (ref 38–126)
Anion gap: 10 (ref 5–15)
BUN: 19 mg/dL (ref 8–23)
CHLORIDE: 102 mmol/L (ref 98–111)
CO2: 28 mmol/L (ref 22–32)
CREATININE: 0.68 mg/dL (ref 0.61–1.24)
Calcium: 8.6 mg/dL — ABNORMAL LOW (ref 8.9–10.3)
GFR calc Af Amer: >60 mL/min (ref >60)
GFR calc non Af Amer: >60 mL/min (ref >60)
Glucose, Bld: 59 mg/dL — ABNORMAL LOW (ref 70–99)
Potassium: 3.6 mmol/L (ref 3.5–5.1)
Sodium: 140 mmol/L (ref 135–145)
Total Bilirubin: 0.5 mg/dL (ref 0.3–1.2)
Total Protein: 4.7 g/dL — ABNORMAL LOW (ref 6.5–8.1)

## 2018-07-10 LAB — CBC
HCT: 28 % — ABNORMAL LOW (ref 39.0–52.0)
Hemoglobin: 8.4 g/dL — ABNORMAL LOW (ref 13.0–17.0)
MCH: 25.4 pg — ABNORMAL LOW (ref 26.0–34.0)
MCHC: 30 g/dL (ref 30.0–36.0)
MCV: 84.6 fL (ref 80.0–100.0)
PLATELETS: 275 10*3/uL (ref 150–400)
RBC: 3.31 MIL/uL — ABNORMAL LOW (ref 4.22–5.81)
RDW: 18.5 % — ABNORMAL HIGH (ref 11.5–15.5)
WBC: 11.3 10*3/uL — ABNORMAL HIGH (ref 4.0–10.5)
nRBC: 0 % (ref 0.0–0.2)

## 2018-07-10 MED ORDER — METOPROLOL SUCCINATE ER 100 MG PO TB24
100.0000 mg | ORAL_TABLET | Freq: Two times a day (BID) | ORAL | Status: DC
  Administered 2018-07-10 – 2018-07-13 (×6): 100 mg via ORAL
  Filled 2018-07-10 (×7): qty 1

## 2018-07-10 NOTE — Progress Notes (Signed)
Subjective: Patient reports overall patient doing fairly well improved back and leg pain but still significant looks to me weakness  Objective: Vital signs in last 24 hours: Temp:  [97.7 F (36.5 C)-99 F (37.2 C)] 97.9 F (36.6 C) (03/09 0700) Pulse Rate:  [69-106] 77 (03/09 0700) Resp:  [14-20] 18 (03/09 0428) BP: (109-143)/(68-97) 109/97 (03/09 0700) SpO2:  [91 %-97 %] 95 % (03/09 0700) Weight:  [96.4 kg] 96.4 kg (03/09 0654)  Intake/Output from previous day: 03/08 0701 - 03/09 0700 In: 720 [P.O.:720] Out: 1650 [Urine:1650] Intake/Output this shift: No intake/output data recorded.  lower extremity weakness stable right leg weaker in the left complete foot drop on the right incision clean dry and intact  Lab Results: Recent Labs    07/09/18 0811 07/10/18 0414  WBC 11.4* 11.3*  HGB 8.3* 8.4*  HCT 27.5* 28.0*  PLT 245 275   BMET Recent Labs    07/09/18 0811 07/10/18 0414  NA 138 140  K 4.2 3.6  CL 102 102  CO2 25 28  GLUCOSE 162* 59*  BUN 19 19  CREATININE 0.68 0.68  CALCIUM 8.6* 8.6*    Studies/Results: No results found.  Assessment/Plan: Continue to mobilize with physical outpatient therapy awaiting rehabilitation placement appreciated internal medicine's help with him.  LOS: 11 days     Keondria Siever P 07/10/2018, 11:20 AM

## 2018-07-10 NOTE — Progress Notes (Signed)
   Subjective: Patient was seen and evaluated at bedside on morning rounds. No acute events overnight. He mentions that his pain is better. He does not remember that he was confused yesterday. He knows that he is in hospital now.   Objective:  Vital signs in last 24 hours: Vitals:   07/09/18 2154 07/10/18 0013 07/10/18 0428 07/10/18 0654  BP: 127/81 (!) 143/68 136/86   Pulse: (!) 105 (!) 105 (!) 105   Resp:  17 18   Temp:  99 F (37.2 C) 97.7 F (36.5 C)   TempSrc:  Oral Oral   SpO2:  94%    Weight:    96.4 kg  Height:       Vital sign reviewed  Constitutional:  Lying back in the bed in no acute distress. Alert and oriented x2 Cardiovascular:  Borderline tachycardia, no murmurs, rubs, or gallops.  Pulmonary/Chest:  CTA bilaterally, no wheezing, no crackle no rhonchi at anterior and lateral chest exam.  Abdominal:  Abdomen is soft, nontender, nondistended.   Extremities: Warm and well perfused. 1+ edema   Assessment/Plan:  Principal Problem:   Cauda equina syndrome (HCC) Active Problems:   Community acquired pneumonia   Chronic obstructive lung disease (HCC)   Controlled type 2 diabetes mellitus without complication, without long-term current use of insulin (HCC)   Spinal stenosis of lumbar region   Spinal stenosis at L4-L5 level   Malnutrition of moderate degree  Cauda Equina Syndrome: S/p lumbar decompression and fusion. POD 5. His pain improved significantly and he did not take any opiate yesterday.  But mobilization continue to be issues.   - Decrease IV dilaudid q6h PRN - Continue oxycodone 10 mg q4h PRN  - Toradol 30 mg IV q6h prn  - Out of bed to chair today; order placed - PT / OT - Medically stable to discharge once patient is on an oral regimen for pain control   Delirium:  Improved today. He was now intermittently confused yesterday, pulling at lines. He is now oriented but Mini mental status exam is abnormal for backward naming of months. Has some  upper extremities clonus on exam, w/o obvious asterixis. But will do [place] to r/o cirrhosis. Liver function stable. No metabolic or focal neurologic signs.  -Abdominal US-->Coarse echogenicity of the liver usually associated with hepatic steatosis or fibrosis. - Delirium precautions - Minimize sedation as much as possible  - Day / night re orientation  -CMP daily  Tachybrady Syndrome s/p pacemaker  Hx A-fib  CHF Tachycardia at 100s. Will increase Metoprol dose from 50 mg BID to 100 mg BID (home dose) (BP is ok at 10-130/)  - Increase metoprolol to 100 mg BID (home dose) -Continue Digoxin  -Continue home lasix 40 mg daily  -Telemetry     Continue to mobilize with physical outpatient therapy  Altered mental status: Improved. Alert and oriented but   Dispo: Anticipated discharge to SNF after be available   Chevis Pretty, MD 07/10/2018, 7:25 AM Pager: [phone]

## 2018-07-10 NOTE — Progress Notes (Signed)
Patient confused on first assessment then alert and oriented x's 4 with therapies. Patient c/o dry mouth (oral care done) and pain related to surgery (see MAR), nothing else.  Patient was able to stand but not ambulate with therapies this morning.  Family at bedside.  Pt transferred to CT for abdomen.

## 2018-07-10 NOTE — Progress Notes (Signed)
Physical Therapy Treatment Patient Details Name: Henry Mcpherson MRN: [id number] DOB: [birth year] Today's Date: 07/10/2018    History of Present Illness Pt is a 63 yo male s/p L3-S1 redo compressive lumbar laminectomies, foraminotomies L3-S1 nerve roots, nonsegmental fixation L3-S1 and posterior lateral arthrodesis L3-S1 after progressive LBP, LE weakness and fecal incontinence. PMH: lumbar surgery, HTN, DM, COPD, CHF    PT Comments    Pt in bed upon arrival with increased confusion stating he was at a football game. Pt able to recall where he was after being told and recalled month, year and what happened correctly. Pt progressed with bed mobility and standing but still requires max assist x2. Pt able to get fully standing two of three trials. HR increased to 180 with initial standing and 145 on second trial. Pt frequently confused by therapist foot blocking his LE (thinking therapist foot is his own). Pt attempted to side step towards head of bed but was unable. Pt would continue to benefit from skilled therapy to improve cognition and functional mobility and decrease caregiver burden.   Follow Up Recommendations  SNF;Supervision/Assistance - 24 hour     Equipment Recommendations  Rolling walker with 5" wheels    Recommendations for Other Services       Precautions / Restrictions Precautions Precautions: Back Precaution Booklet Issued: Yes (comment) Precaution Comments: pt confused today, unable to appropriately educate Required Braces or Orthoses: Spinal Brace Spinal Brace: Lumbar corset;Applied in sitting position(pt required assistance to don/dof brace. pt able to verbalize how to don) Restrictions Weight Bearing Restrictions: No    Mobility  Bed Mobility Overal bed mobility: Needs Assistance Bed Mobility: Rolling;Sidelying to Sit;Sit to Sidelying Rolling: Max assist;+2 for physical assistance Sidelying to sit: Max assist;+2 for physical assistance     Sit to sidelying:  Max assist;+2 for physical assistance General bed mobility comments: increased time and effort, cuing for long roll technique, max assist for moving LEs, physical assistance x2 for all   Transfers Overall transfer level: Needs assistance Equipment used: Rolling walker (2 wheeled) Transfers: Sit to/from Stand Sit to Stand: Max assist;+2 physical assistance;From elevated surface         General transfer comment: x3 standing trials, pt able to get fully standing on first and second trial with max A x2, pt HR 180 with first stand and 145 with second stand, decreased in sitting, pt stands with trunk flexed, max cuing to push UE through walker and not pulling walker, pt frequently confused by therapist foot blocking LLE stating his foot was sliding out of his shoe, unable to side step to head of bed  Ambulation/Gait             General Gait Details: unable   Stairs             Wheelchair Mobility    Modified Rankin (Stroke Patients Only)       Balance Overall balance assessment: Needs assistance Sitting-balance support: Bilateral upper extremity supported Sitting balance-Leahy Scale: Fair Sitting balance - Comments: pt able to sit EOB without UE support briefly    Standing balance support: Bilateral upper extremity supported Standing balance-Leahy Scale: Poor                              Cognition Arousal/Alertness: Awake/alert   Overall Cognitive Status: Impaired/Different from baseline Area of Impairment: Orientation;Attention;Memory;Following commands;Awareness;Safety/judgement;Problem solving  Orientation Level: Disoriented to;Time;Situation;Place Current Attention Level: Sustained Memory: Decreased short-term memory;Decreased recall of precautions Following Commands: Follows one step commands inconsistently Safety/Judgement: Decreased awareness of deficits;Decreased awareness of safety Awareness: Intellectual Problem  Solving: Slow processing;Decreased initiation;Difficulty sequencing;Requires verbal cues General Comments: pt reported he was at a football game, able to recall place and city after being told, knew year and month      Exercises Total Joint Exercises Short Arc Quad: AROM;Strengthening;Both;20 reps;Seated(bed in chair position)    General Comments        Pertinent Vitals/Pain Faces Pain Scale: Hurts even more Pain Location: back Pain Descriptors / Indicators: Aching;Sore;Discomfort Pain Intervention(s): Monitored during session;Repositioned    Home Living                      Prior Function            PT Goals (current goals can now be found in the care plan section) Progress towards PT goals: Progressing toward goals    Frequency    Min 5X/week      PT Plan Current plan remains appropriate    Co-evaluation              AM-PAC PT "6 Clicks" Mobility   Outcome Measure  Help needed turning from your back to your side while in a flat bed without using bedrails?: Total Help needed moving from lying on your back to sitting on the side of a flat bed without using bedrails?: Total Help needed moving to and from a bed to a chair (including a wheelchair)?: Total Help needed standing up from a chair using your arms (e.g., wheelchair or bedside chair)?: Total Help needed to walk in hospital room?: Total Help needed climbing 3-5 steps with a railing? : Total 6 Click Score: 6    End of Session Equipment Utilized During Treatment: Gait belt;Back brace Activity Tolerance: Patient limited by pain Patient left: in bed;with call bell/phone within reach;with bed alarm set Nurse Communication: Mobility status PT Visit Diagnosis: Muscle weakness (generalized) (M62.81);Other symptoms and signs involving the nervous system (R29.898);Other abnormalities of gait and mobility (R26.89)     Time: 7544-9201 PT Time Calculation (min) (ACUTE ONLY): 25 min  Charges:   $Therapeutic Activity: 23-37 mins                     Elliott Lasecki, [place] [phone]    Katianne Barre 07/10/2018, 9:32 AM

## 2018-07-10 NOTE — Progress Notes (Signed)
IP rehab admissions - I have opened the case with [employer] and I have faxed clinicals requesting acute inpatient rehab admission.  I updated patient and his wife today.  I will follow up once I hear back from insurance case manager.  Call me for questions.  [phone]

## 2018-07-11 LAB — COMPREHENSIVE METABOLIC PANEL
ALK PHOS: 94 U/L (ref 38–126)
ALT: 82 U/L — ABNORMAL HIGH (ref 0–44)
ANION GAP: 11 (ref 5–15)
AST: 70 U/L — ABNORMAL HIGH (ref 15–41)
Albumin: 1.7 g/dL — ABNORMAL LOW (ref 3.5–5.0)
BILIRUBIN TOTAL: 0.5 mg/dL (ref 0.3–1.2)
BUN: 20 mg/dL (ref 8–23)
CO2: 27 mmol/L (ref 22–32)
Calcium: 8.3 mg/dL — ABNORMAL LOW (ref 8.9–10.3)
Chloride: 100 mmol/L (ref 98–111)
Creatinine, Ser: 0.71 mg/dL (ref 0.61–1.24)
GFR calc non Af Amer: >60 mL/min (ref >60)
Glucose, Bld: 98 mg/dL (ref 70–99)
Potassium: 3.4 mmol/L — ABNORMAL LOW (ref 3.5–5.1)
Sodium: 138 mmol/L (ref 135–145)
Total Protein: 4.5 g/dL — ABNORMAL LOW (ref 6.5–8.1)

## 2018-07-11 LAB — GLUCOSE, CAPILLARY
Glucose-Capillary: 91 mg/dL (ref 70–99)
Glucose-Capillary: 126 mg/dL — ABNORMAL HIGH (ref 70–99)
Glucose-Capillary: 121 mg/dL — ABNORMAL HIGH (ref 70–99)
Glucose-Capillary: 138 mg/dL — ABNORMAL HIGH (ref 70–99)

## 2018-07-11 LAB — CBC
HCT: 28.3 % — ABNORMAL LOW (ref 39.0–52.0)
Hemoglobin: 8.3 g/dL — ABNORMAL LOW (ref 13.0–17.0)
MCH: 25.2 pg — AB (ref 26.0–34.0)
MCHC: 29.3 g/dL — ABNORMAL LOW (ref 30.0–36.0)
MCV: 86 fL (ref 80.0–100.0)
Platelets: 303 10*3/uL (ref 150–400)
RBC: 3.29 MIL/uL — AB (ref 4.22–5.81)
RDW: 18.5 % — ABNORMAL HIGH (ref 11.5–15.5)
WBC: 10.9 10*3/uL — ABNORMAL HIGH (ref 4.0–10.5)
nRBC: 0 % (ref 0.0–0.2)

## 2018-07-11 MED ORDER — IPRATROPIUM-ALBUTEROL 0.5-2.5 (3) MG/3ML IN SOLN: 3.0000 mL | RESPIRATORY_TRACT | Status: DC | PRN

## 2018-07-11 MED ORDER — NICOTINE 14 MG/24HR TD PT24
14.0000 mg | MEDICATED_PATCH | Freq: Every day | TRANSDERMAL | Status: DC
  Administered 2018-07-11 – 2018-07-13 (×3): 14 mg via TRANSDERMAL
  Filled 2018-07-11 (×3): qty 1

## 2018-07-11 NOTE — TOC Initial Note (Signed)
Transition of Care [facility]) - Initial/Assessment Note    Patient Details  Name: Henry Mcpherson MRN: [id number] Date of Birth: 1955-08-20  [facility] CM/SW Contact:    Doy Hutching, LCSWA Phone Number: 07/11/2018, 2:59 PM  Clinical Narrative:                 CSW spoke with pt wife Henry Mcpherson via telephone. Pt from home with his spouse in [place]. They have supportive adult children. Pt wife states that pt has been to rehab in the past- pt wife states preference for [facility] facilities. Pt wife understands that ambulance transport would be private pay for pt due to being outside of 50 mile radius of [place].   Called [facility] facilities they have requested information faxed to [facility] at [phone].   Expected Discharge Plan: Skilled Nursing Facility Barriers to Discharge: Insurance Authorization, Continued Medical Work up   Patient Goals and CMS Choice Patient states their goals for this hospitalization and ongoing recovery are:: "for him to not become bedbound"- pt wife CMS Medicare.gov Compare Post Acute Care list provided to:: Patient Represenative (must comment)(pt wife) Choice offered to / list presented to : Spouse  Expected Discharge Plan and Services Expected Discharge Plan: Skilled Nursing Facility Discharge Planning Services: DC out of service area Post Acute Care Choice: Skilled Nursing Facility Living arrangements for the past 2 months: Single Family Home                 DME Arranged: N/A DME Agency: NA HH Arranged: NA HH Agency: NA  Prior Living Arrangements/Services Living arrangements for the past 2 months: Single Family Home Lives with:: Spouse Patient language and need for interpreter reviewed:: No Do you feel safe going back to the place where you live?: Yes(after rehab)      Need for Family Participation in Patient Care: Yes (Comment)(decision making) Care giver support system in place?: Yes (comment)(pt wife and adult  children) Current home services: DME Criminal Activity/Legal Involvement Pertinent to Current Situation/Hospitalization: No - Comment as needed  Activities of Daily Living Home Assistive Devices/Equipment: Eyeglasses ADL Screening (condition at time of admission) Patient's cognitive ability adequate to safely complete daily activities?: Yes Is the patient deaf or have difficulty hearing?: No Does the patient have difficulty seeing, even when wearing glasses/contacts?: No Does the patient have difficulty concentrating, remembering, or making decisions?: No Patient able to express need for assistance with ADLs?: Yes Does the patient have difficulty dressing or bathing?: Yes Independently performs ADLs?: No Communication: Independent Dressing (OT): Needs assistance Is this a change from baseline?: Pre-admission baseline Grooming: Needs assistance Is this a change from baseline?: Pre-admission baseline Feeding: Independent with device (comment) Bathing: Needs assistance Is this a change from baseline?: Pre-admission baseline Toileting: Needs assistance Is this a change from baseline?: Pre-admission baseline In/Out Bed: Needs assistance Is this a change from baseline?: Pre-admission baseline Walks in Home: Dependent Is this a change from baseline?: Pre-admission baseline Does the patient have difficulty walking or climbing stairs?: Yes Weakness of Legs: Both Weakness of Arms/Hands: Both  Permission Sought/Granted Permission sought to share information with : Family Supports, Oceanographer granted to share information with : No(pt orientation fluctuating)  Share Information with NAME: Henry Mcpherson  Permission granted to share info w AGENCY: [facility]; [facility]  Permission granted to share info w Relationship: wife  Permission granted to share info w Contact Information: [phone]  Emotional Assessment Appearance:: Appears  stated age Attitude/Demeanor/Rapport: Unable  to Assess Affect (typically observed): Unable to Assess Orientation: : Oriented to Self, Oriented to Place, Oriented to Situation, Fluctuating Orientation (Suspected and/or reported Sundowners) Alcohol / Substance Use: Not Applicable Psych Involvement: No (comment)  Admission diagnosis:  Pre-op evaluation [Z01.818] Community acquired pneumonia of right lung, unspecified part of lung [J18.9] Spinal stenosis at L4-L5 level [M48.061] Patient Active Problem List   Diagnosis Date Noted  . Malnutrition of moderate degree 07/08/2018  . Spinal stenosis at L4-L5 level 07/05/2018  . Cauda equina syndrome (HCC) 07/03/2018  . Spinal stenosis of lumbar region 07/03/2018  . Community acquired pneumonia 06/29/2018  . Controlled type 2 diabetes mellitus without complication, without long-term current use of insulin (HCC) 06/26/2018  . Chronic obstructive lung disease (HCC) 06/24/2017  . Diabetic ulcer of right midfoot associated with type 2 diabetes mellitus, limited to breakdown of skin (HCC) 03/08/2016  . Foot drop, right 03/08/2016   PCP:  System, Pcp Not In Pharmacy:   [facility] [address], [place] - [address] [place] [address] Phone: [phone] Fax: [phone]     Social Determinants of Health (SDOH) Interventions    Readmission Risk Interventions 30 Day Unplanned Readmission Risk Score     ED to Hosp-Admission (Current) from 06/29/2018 in  [facility]  30 Day Unplanned Readmission Risk Score (%)  18 Filed at 07/11/2018 1200     This score is the patient's risk of an unplanned readmission within 30 days of being discharged (0 -100%). The score is based on dignosis, age, lab data, medications, orders, and past utilization.   Low:  0-14.9   Medium: 15-21.9   High: 22-29.9   Extreme: 30 and above       No flowsheet data found.

## 2018-07-11 NOTE — Progress Notes (Signed)
Subjective: Patient reports overall doing well improved lower Sherry function improved pain  Objective: Vital signs in last 24 hours: Temp:  [97.2 F (36.2 C)-98.4 F (36.9 C)] 97.8 F (36.6 C) (03/10 0400) Pulse Rate:  [62-105] 101 (03/10 0400) Resp:  [16-20] 16 (03/10 0400) BP: (107-133)/(71-98) 133/89 (03/10 0400) SpO2:  [93 %-100 %] 100 % (03/10 0400) Weight:  [96.4 kg] 96.4 kg (03/09 0654)  Intake/Output from previous day: 03/09 0701 - 03/10 0700 In: 0  Out: 925 [Urine:925] Intake/Output this shift: Total I/O In: -  Out: 600 [Urine:600]  continue to mobilize with physical and occupational therapy awaiting rehabilitation placement  Lab Results: Recent Labs    07/09/18 0811 07/10/18 0414  WBC 11.4* 11.3*  HGB 8.3* 8.4*  HCT 27.5* 28.0*  PLT 245 275   BMET Recent Labs    07/09/18 0811 07/10/18 0414  NA 138 140  K 4.2 3.6  CL 102 102  CO2 25 28  GLUCOSE 162* 59*  BUN 19 19  CREATININE 0.68 0.68  CALCIUM 8.6* 8.6*    Studies/Results: US Abdomen Complete  Result Date: 07/10/2018 CLINICAL DATA:  Transaminitis. EXAM: ABDOMEN ULTRASOUND COMPLETE COMPARISON:  None. FINDINGS: Gallbladder: No gallstones. Borderline gallbladder wall thickening measuring 3.3 mm. No sonographic Murphy sign noted by sonographer. Common bile duct: Diameter: 2.4 mm Liver: No focal lesion identified. Coarse parenchymal echogenicity. Left lobe is obscured by bowel gas. Portal vein is patent on color Doppler imaging with normal direction of blood flow towards the liver. IVC: No abnormality visualized. Pancreas: Obscured by bowel gas. Spleen: Size and appearance within normal limits. Right Kidney: Length: 11.9 cm. Thinning of the renal cortex noted. Two benign-appearing cysts is, the larger measuring 2.5 cm. Left Kidney: Length: 12.8 cm. Thinning of the renal cortex noted. No mass or hydronephrosis visualized. Abdominal aorta: No aneurysm visualized. Other findings: None. IMPRESSION: Coarse  echogenicity of the liver usually associated with hepatic steatosis or fibrosis. Borderline thickening of the gallbladder wall, nonspecific in the absence of other signs of acute cholecystitis. Bilateral renal cortical thinning, usually associated with medical renal disease. Two benign-appearing right renal cysts, the larger measuring 2.5 cm. Electronically Signed   By: Ted Mcalpine M.D.   On: 07/10/2018 12:37    Assessment/Plan: Postop day 6 lumbar decompression fusion making slow but steady progress. Continue physical occupational therapy awaiting rehabilitation placement  LOS: 12 days     Neaveh Belanger P 07/11/2018, 6:49 AM

## 2018-07-11 NOTE — NC FL2 (Signed)
[place] MEDICAID FL2 LEVEL OF CARE SCREENING TOOL     IDENTIFICATION  Patient Name: Henry Mcpherson Birthdate: [birth year] Sex: male Admission Date (Current Location): 06/29/2018  [facility] and [place] Number:  Producer, television/film/video and Address:  [place]. [facility], [address], [place], [place] [address]      Provider Number: [id number]  Attending Physician Name and Address:  Tyson Alias, *  Relative Name and Phone Number:  Amardeep Zerba; wife; [phone]    Current Level of Care: Hospital Recommended Level of Care: Skilled Nursing Facility Prior Approval Number:    Date Approved/Denied:   PASRR Number:    Discharge Plan: SNF    Current Diagnoses: Patient Active Problem List   Diagnosis Date Noted  . Malnutrition of moderate degree 07/08/2018  . Spinal stenosis at L4-L5 level 07/05/2018  . Cauda equina syndrome (HCC) 07/03/2018  . Spinal stenosis of lumbar region 07/03/2018  . Community acquired pneumonia 06/29/2018  . Controlled type 2 diabetes mellitus without complication, without long-term current use of insulin (HCC) 06/26/2018  . Chronic obstructive lung disease (HCC) 06/24/2017  . Diabetic ulcer of right midfoot associated with type 2 diabetes mellitus, limited to breakdown of skin (HCC) 03/08/2016  . Foot drop, right 03/08/2016    Orientation RESPIRATION BLADDER Height & Weight     Self, Time, Situation, Place(fluctuating)  Normal, O2(2L O2 at night) Continent, External catheter Weight: 212 lb 8.4 oz (96.4 kg) Height:  6\' 2"  (188 cm)  BEHAVIORAL SYMPTOMS/MOOD NEUROLOGICAL BOWEL NUTRITION STATUS      Continent Diet(see discharge summary)  AMBULATORY STATUS COMMUNICATION OF NEEDS Skin   Extensive Assist Verbally Surgical wounds, Skin abrasions, Other (Comment)(closed incision on back with honeycomb dressing; diabetic ulcers (2) with foam PRN; abrasions bilaterally on feet/arms/legs; prior amputation of R great toe; MASD on  medical buttocks with barrier cream; skin tear on L posterior arms with foam PRN)                       Personal Care Assistance Level of Assistance  Bathing, Feeding, Dressing Bathing Assistance: Maximum assistance Feeding assistance: Independent Dressing Assistance: Maximum assistance     Functional Limitations Info  Sight, Hearing, Speech Sight Info: Adequate Hearing Info: Adequate Speech Info: Adequate    SPECIAL CARE FACTORS FREQUENCY  PT (By licensed PT), OT (By licensed OT)     PT Frequency: 5x week OT Frequency: 5x week            Contractures Contractures Info: Not present    Additional Factors Info  Code Status, Allergies, Isolation Precautions, Insulin Sliding Scale Code Status Info: Full Code Allergies Info: No Known Allergies    Insulin Sliding Scale Info: insulin aspart (novoLOG) injection 0-5 Units daily at bedtime; insulin aspart (novoLOG) injection 0-9 Units 3x daily with meals; insulin aspart (novoLOG) injection 2 Units 3x daily with meals; insulin glargine (LANTUS) injection 8 Units daily at bedtime Isolation Precautions Info: contact precautions for MRSA     Current Medications (07/11/2018):  This is the current hospital active medication list Current Facility-Administered Medications  Medication Dose Route Frequency Provider Last Rate Last Dose  . 0.9 %  sodium chloride infusion (Manually program via Guardrails IV Fluids)   Intravenous Once Donalee Citrin, MD      . 0.9 %  sodium chloride infusion  250 mL Intravenous Continuous Donalee Citrin, MD      . acetaminophen (TYLENOL) tablet 650 mg  650 mg Oral  Q4H PRN Donalee Citrin, MD   650 mg at 07/11/18 1257   Or  . acetaminophen (TYLENOL) suppository 650 mg  650 mg Rectal Q4H PRN Donalee Citrin, MD      . alum & mag hydroxide-simeth (MAALOX/MYLANTA) 200-200-20 MG/5ML suspension 30 mL  30 mL Oral Q6H PRN Donalee Citrin, MD      . atorvastatin (LIPITOR) tablet 40 mg  40 mg Oral Daily Donalee Citrin, MD   40 mg at 07/09/18  1100  . cyclobenzaprine (FLEXERIL) tablet 10 mg  10 mg Oral TID PRN Donalee Citrin, MD   10 mg at 07/11/18 0830  . diclofenac sodium (VOLTAREN) 1 % transdermal gel 2 g  2 g Topical QID PRN Donalee Citrin, MD   2 g at 07/11/18 0843  . digoxin (LANOXIN) tablet 0.125 mg  0.125 mg Oral Daily Donalee Citrin, MD   0.125 mg at 07/10/18 1334  . enoxaparin (LOVENOX) injection 40 mg  40 mg Subcutaneous Q24H Masoudi, Elhamalsadat, MD   40 mg at 07/11/18 1443  . feeding supplement (ENSURE ENLIVE) (ENSURE ENLIVE) liquid 237 mL  237 mL Oral TID BM Donalee Citrin, MD   237 mL at 07/11/18 1257  . furosemide (LASIX) tablet 40 mg  40 mg Oral Daily Masoudi, Elhamalsadat, MD   40 mg at 07/11/18 0830  . hydrALAZINE (APRESOLINE) injection 2 mg  2 mg Intravenous Q6H PRN Donalee Citrin, MD      . HYDROmorphone (DILAUDID) injection 0.5 mg  0.5 mg Intravenous Q6H PRN Rehman, Areeg N, DO      . insulin aspart (novoLOG) injection 0-5 Units  0-5 Units Subcutaneous QHS Donalee Citrin, MD   2 Units at 07/10/18 2111  . insulin aspart (novoLOG) injection 0-9 Units  0-9 Units Subcutaneous TID WC Donalee Citrin, MD   1 Units at 07/11/18 1259  . insulin aspart (novoLOG) injection 2 Units  2 Units Subcutaneous TID WC Masoudi, Elhamalsadat, MD   2 Units at 07/11/18 1258  . insulin glargine (LANTUS) injection 8 Units  8 Units Subcutaneous QHS Masoudi, Elhamalsadat, MD   8 Units at 07/10/18 2107  . ipratropium-albuterol (DUONEB) 0.5-2.5 (3) MG/3ML nebulizer solution 3 mL  3 mL Nebulization Q4H PRN Tyson Alias, MD      . lidocaine (LIDODERM) 5 % 1 patch  1 patch Transdermal Q24H Donalee Citrin, MD   1 patch at 07/10/18 1333  . menthol-cetylpyridinium (CEPACOL) lozenge 3 mg  1 lozenge Oral PRN Donalee Citrin, MD       Or  . phenol (CHLORASEPTIC) mouth spray 1 spray  1 spray Mouth/Throat PRN Donalee Citrin, MD      . metoprolol succinate (TOPROL-XL) 24 hr tablet 100 mg  100 mg Oral BID Masoudi, Elhamalsadat, MD   100 mg at 07/11/18 0832  . multivitamin with minerals  tablet 1 tablet  1 tablet Oral Daily Donalee Citrin, MD   1 tablet at 07/11/18 8242  . naphazoline-glycerin (CLEAR EYES REDNESS) ophth solution 1 drop  1 drop Both Eyes QID PRN Donalee Citrin, MD   1 drop at 07/05/18 1053  . nicotine (NICODERM CQ - dosed in mg/24 hours) patch 14 mg  14 mg Transdermal Daily Reymundo Poll, MD   14 mg at 07/11/18 1259  . ondansetron (ZOFRAN) tablet 4 mg  4 mg Oral Q6H PRN Donalee Citrin, MD       Or  . ondansetron [facility]) injection 4 mg  4 mg Intravenous Q6H PRN Donalee Citrin, MD      .  oxyCODONE (Oxy IR/ROXICODONE) immediate release tablet 10 mg  10 mg Oral Q4H PRN Masoudi, Elhamalsadat, MD   10 mg at 07/11/18 0840  . pantoprazole (PROTONIX) EC tablet 40 mg  40 mg Oral Daily Tyson Alias, MD   40 mg at 07/10/18 2107  . polyethylene glycol (MIRALAX / GLYCOLAX) packet 17 g  17 g Oral Daily Rehman, Areeg N, DO   17 g at 07/11/18 1258  . senna-docusate (Senokot-S) tablet 1 tablet  1 tablet Oral BID Donalee Citrin, MD   1 tablet at 07/11/18 0830  . sodium chloride flush (NS) 0.9 % injection 3 mL  3 mL Intravenous Q12H Donalee Citrin, MD   3 mL at 07/11/18 5277  . sodium chloride flush (NS) 0.9 % injection 3 mL  3 mL Intravenous PRN Donalee Citrin, MD   3 mL at 07/06/18 1645     Discharge Medications: Please see discharge summary for a list of discharge medications.  Relevant Imaging Results:  Relevant Lab Results:   Additional Information SS#[id number] [place], [place]

## 2018-07-11 NOTE — Progress Notes (Signed)
Physical Therapy Treatment Patient Details Name: Henry Mcpherson MRN: [id number] DOB: 07/25/1955 Today's Date: 07/11/2018    History of Present Illness Pt is a 63 yo male s/p L3-S1 redo compressive lumbar laminectomies, foraminotomies L3-S1 nerve roots, nonsegmental fixation L3-S1 and posterior lateral arthrodesis L3-S1 after progressive LBP, LE weakness and fecal incontinence. PMH: lumbar surgery, HTN, DM, COPD, CHF    PT Comments    Pt awake and in bed upon arrival. Pt's confusion appeared to be mildly improved but patient still presents with cognitive deficits limiting functional mobility. Pt continues to require max assist x2 for bed mobility and sit to stand with stedy. Pt got a cut over thumb assisting with the donning of brace with nursing notified and dressing applied.  Pt would benefit from continue skilled therapy to improve strength, cognition and functional mobility to decrease caregiver burden. Pt and spouse educated for frequency and discharge recommendations.   Follow Up Recommendations  SNF;Supervision/Assistance - 24 hour     Equipment Recommendations  Other (comment)(TBD next venue)    Recommendations for Other Services       Precautions / Restrictions Precautions Precautions: Back Precaution Booklet Issued: Yes (comment) Precaution Comments: pt confused today, unable to appropriately educate Required Braces or Orthoses: Spinal Brace Spinal Brace: Lumbar corset;Applied in sitting position(pt requires assistance to don/doff brace) Restrictions Weight Bearing Restrictions: No    Mobility  Bed Mobility Overal bed mobility: Needs Assistance Bed Mobility: Rolling;Sidelying to Sit;Sit to Sidelying Rolling: Max assist;+2 for physical assistance Sidelying to sit: Max assist;+2 for physical assistance     Sit to sidelying: Max assist;+2 for physical assistance General bed mobility comments: increased time and effort, cuing for long roll technique, max assist for  moving LEs, physical assistance x2 for all   Transfers Overall transfer level: Needs assistance   Transfers: Sit to/from Stand Sit to Stand: Max assist;+2 physical assistance;From elevated surface         General transfer comment: x 4 trials with use of stedy from elevated bed. Pt able to stand one trial fully erect grossly 30 sec for pericare but pt sitting quickly without warning. In sitting pt with posterior lean and pushing with max assist for anterior translation and sitting balance with pt unable to maintain without max cues and assist, improved with ability to hold stedy bar EOB. Pt able to sit grossly 15 min total with assist of stedy and therapist throughout 4 trials. Deferred OOB to chair due to pt being unable to stand from stedy without max +2 assist and high surface  Ambulation/Gait             General Gait Details: unable   Stairs             Wheelchair Mobility    Modified Rankin (Stroke Patients Only)       Balance Overall balance assessment: Needs assistance Sitting-balance support: Bilateral upper extremity supported Sitting balance-Leahy Scale: Zero Sitting balance - Comments: pt pushing posteriorly spontaneously and requires max assist   Standing balance support: Bilateral upper extremity supported Standing balance-Leahy Scale: Zero Standing balance comment: bil UE on stedy each trial with tactile and verbal cues and assist                            Cognition Arousal/Alertness: Awake/alert Behavior During Therapy: Restless Overall Cognitive Status: Impaired/Different from baseline Area of Impairment: Orientation;Attention;Memory;Following commands;Awareness;Safety/judgement;Problem solving  Orientation Level: Disoriented to;Place Current Attention Level: Sustained Memory: Decreased short-term memory;Decreased recall of precautions Following Commands: Follows one step commands inconsistently;Follows one  step commands with increased time Safety/Judgement: Decreased awareness of safety;Decreased awareness of deficits Awareness: Intellectual Problem Solving: Slow processing;Decreased initiation;Difficulty sequencing;Requires verbal cues General Comments: pt reported being at a different hospital but knew he had back surgery, correct with president, month and year      Exercises      General Comments        Pertinent Vitals/Pain Faces Pain Scale: Hurts little more Pain Location: back Pain Descriptors / Indicators: Aching;Sore;Discomfort;Grimacing Pain Intervention(s): Monitored during session;Premedicated before session;Repositioned    Home Living                      Prior Function            PT Goals (current goals can now be found in the care plan section) Progress towards PT goals: Progressing toward goals    Frequency    Min 3X/week      PT Plan Current plan remains appropriate    Co-evaluation              AM-PAC PT "6 Clicks" Mobility   Outcome Measure  Help needed turning from your back to your side while in a flat bed without using bedrails?: Total Help needed moving from lying on your back to sitting on the side of a flat bed without using bedrails?: Total Help needed moving to and from a bed to a chair (including a wheelchair)?: Total Help needed standing up from a chair using your arms (e.g., wheelchair or bedside chair)?: Total Help needed to walk in hospital room?: Total Help needed climbing 3-5 steps with a railing? : Total 6 Click Score: 6    End of Session Equipment Utilized During Treatment: Gait belt;Back brace Activity Tolerance: Patient limited by pain Patient left: in bed;with call bell/phone within reach;with bed alarm set;with family/visitor present Nurse Communication: Mobility status PT Visit Diagnosis: Muscle weakness (generalized) (M62.81);Other symptoms and signs involving the nervous system (R29.898);Other  abnormalities of gait and mobility (R26.89)     Time: 8469-6295 PT Time Calculation (min) (ACUTE ONLY): 37 min  Charges:  $Therapeutic Activity: 23-37 mins                     Khris Jansson, [place] [phone]    Yazlynn Birkeland 07/11/2018, 11:26 AM

## 2018-07-11 NOTE — Progress Notes (Signed)
   Subjective: Patient was seen and evaluated at bedside on morning rounds. No acute events overnight.  Wife is in the room, and endorses that patient still confused sometimes.  He mentions that the pain is better.  Explained to patient and his wife the importance of ambulating and working with physical therapy.  Objective:  Vital signs in last 24 hours: Vitals:   07/10/18 2006 07/10/18 2107 07/10/18 2335 07/11/18 0400  BP: 128/78 115/72 (!) 118/98 133/89  Pulse: (!) 105 (!) 101 (!) 101 (!) 101  Resp: 18  18 16   Temp: 97.7 F (36.5 C)  97.7 F (36.5 C) 97.8 F (36.6 C)  TempSrc: Oral  Oral Oral  SpO2: 95%  95% 100%  Weight:      Height:       Physical exam: Vital signs reviewed General: Patient is alert, oriented to city hospital, not to the time. Is confused and can not adresse all the questions precisely  in no acute distress CV: Borderline tachycardia, no murmur Lungs: CTA bilaterally no respiratory distress Abdomen: Is soft and nontender to palpation Extremities: No lower extremity edema, pulses are palpable, can move his feet   Assessment/Plan:  Principal Problem:   Cauda equina syndrome (HCC) Active Problems:   Community acquired pneumonia   Chronic obstructive lung disease (HCC)   Controlled type 2 diabetes mellitus without complication, without long-term current use of insulin (HCC)   Spinal stenosis of lumbar region   Spinal stenosis at L4-L5 level   Malnutrition of moderate degree  Cauda Equina Syndrome:S/p lumbar decompression and fusion. POD 5. His pain improved significantly and he just toke 2 dose of Oxy yesterday. Was able to take few steps with PT yesterday but mobilization continue to be issues.   - Decrease IV dilaudid q6h PRN - Continue oxycodone 10 mg q4h PRN  - Toradol 30 mg IV q6h prn  - Medically stable to discharge once patient is on an oral regimen for pain control  - PT / OT. Continue to mobilize with physical outpatient therapy    Delirium: He is alert and partially oriented to city, but confused and can not directly address the questions. No metabolic origin or focal neurologic signs. Abdominal US without cirrhoisis. No clear etiology, likely post surgical dilirium   - Delirium precautions - Minimize sedation as much as possible  - Day / night re orientation  -CMP daily  Tachybrady Syndrome s/p pacemaker  Hx A-fib  CHF No RVR. PR remained around 100s.  -Continue Metoprolol 100 mg BID -Continue Digoxin 0.125 mg QD -Continue home lasix 40 mg daily  -Continue cardiac monitoring   Dispo: Anticipated discharge to SNF after bed available.  Chevis Pretty, MD 07/11/2018, 6:49 AM Pager: [phone]

## 2018-07-11 NOTE — Progress Notes (Signed)
Nutrition Follow-up  DOCUMENTATION CODES:   Non-severe (moderate) malnutrition in context of chronic illness  INTERVENTION:  Continue Ensure Enlive po TID, each supplement provides 350 kcal and 20 grams of protein.  Encourage adequate PO intake.   NUTRITION DIAGNOSIS:   Moderate Malnutrition related to chronic illness(COPD, CHF, DM) as evidenced by energy intake < 75% for > or equal to 1 month, mild fat depletion, moderate fat depletion, mild muscle depletion, moderate muscle depletion; ongoing  GOAL:   Patient will meet greater than or equal to 90% of their needs; met  MONITOR:   PO intake, Supplement acceptance, Labs, Weight trends, Skin, I & O's  REASON FOR ASSESSMENT:   Malnutrition Screening Tool    ASSESSMENT:   63 y.o. male with PMHx of  COPD, CHF, T2DM, Afib, pace maker, lumbar disc disease s/p fusion wo scented with new pneumonia as well as worsening neurological symptoms consistent with worsening spinal stenosis.   Procedure(3/4): Lumbar three-Sacral one Decompressive Lumbar Laminectomy and Fusion    Meal completion has been 50-60%. Pt currently has Ensure ordered and has been consuming them. RD to continue with current orders to aid in caloric and protein needs. Pt encouraged to eat his food at meals and to drink his supplements. Plans for SNF at discharge. Labs and medications reviewed.   Diet Order:   Diet Order            Diet heart healthy/carb modified Room service appropriate? Yes; Fluid consistency: Thin  Diet effective now              EDUCATION NEEDS:   Education needs have been addressed  Skin:  Skin Assessment: Skin Integrity Issues: Skin Integrity Issues:: Incisions, Diabetic Ulcer Diabetic Ulcer: Left heel Incisions: back  Last BM:  3/9  Height:   Ht Readings from Last 1 Encounters:  06/29/18 6' 2"  (1.88 m)    Weight:   Wt Readings from Last 1 Encounters:  07/10/18 96.4 kg    Ideal Body Weight:  86.4 kg  BMI:  Body mass  index is 27.29 kg/m.  Estimated Nutritional Needs:   Kcal:  3582-5189  Protein:  120-135 gm  Fluid:  2.3-2.5 L    Henry Parker, MS, RD, LDN Pager # [phone] After hours/ weekend pager # [phone]

## 2018-07-11 NOTE — Progress Notes (Signed)
IP rehab admissions - I have received a denial from [facility] for acute inpatient rehab admission.   I have talked with his wife about the denial.  Wife is interested in SNF placement.  Her first choice is [facility] in [place], [place] and second choice is [facility] in [place], [place].  Wife call back number is [phone].  Call me for questions.  [phone]

## 2018-07-11 NOTE — Progress Notes (Signed)
   S: No complaints today.  Pain well controlled.  Able to sit up a little more in bed.  Not yet walking but some progress with his therapy.  O:  Vitals:   07/11/18 1201 07/11/18 1202  BP: 107/74   Pulse: (!) 103 (!) 105  Resp:    Temp:  97.7 F (36.5 C)  SpO2: 95% (!) 89%   Gen: Alert, disoriented, no distress Ext: Warm well perfused, trace nonpitting edema bilaterally Neuro: Oriented to person, not place or time.  Moves all extremities.  Conversational, not distractible  A/P:  Principal Problem:   Cauda equina syndrome (HCC) Active Problems:   Community acquired pneumonia   Chronic obstructive lung disease (HCC)   Controlled type 2 diabetes mellitus without complication, without long-term current use of insulin (HCC)   Spinal stenosis of lumbar region   Spinal stenosis at L4-L5 level   Malnutrition of moderate degree  Low back pain due to severe spinal stenosis: Status post surgical management on 3/4.  He has been very slow to regain strength or mobilize out of bed afterwards.  I think we are dealing with profound deconditioning, he was essentially bedbound for a week prior to hospitalization.  Greatly appreciates physical therapy interventions.  Not a candidate for inpatient rehab.  We are looking for subacute rehab placement options.  Mild intermittent delirium: Stable to a little improved today.  We have minimize centrally acting medications.  No metabolic derangements on labs.  Liver function is fine.  No signs of infection.  Plan to use standard delirium precautions.  I hope this will improve as he has more out of bed activity and return to normal functioning.   Diabetes is well controlled on sliding scale insulin.  Lovenox for DVT prophylaxis.  Medically stable for discharge to subacute rehab when bed available.  Erlinda Hong, MD FACP

## 2018-07-12 ENCOUNTER — Inpatient Hospital Stay (HOSPITAL_COMMUNITY): Payer: BLUE CROSS/BLUE SHIELD

## 2018-07-12 LAB — COMPREHENSIVE METABOLIC PANEL
ALBUMIN: 1.7 g/dL — AB (ref 3.5–5.0)
ALT: 63 U/L — ABNORMAL HIGH (ref 0–44)
ANION GAP: 9 (ref 5–15)
AST: 40 U/L (ref 15–41)
Alkaline Phosphatase: 90 U/L (ref 38–126)
BUN: 17 mg/dL (ref 8–23)
CO2: 28 mmol/L (ref 22–32)
Calcium: 8.4 mg/dL — ABNORMAL LOW (ref 8.9–10.3)
Chloride: 104 mmol/L (ref 98–111)
Creatinine, Ser: 0.76 mg/dL (ref 0.61–1.24)
GFR calc Af Amer: >60 mL/min (ref >60)
GFR calc non Af Amer: >60 mL/min (ref >60)
Glucose, Bld: 103 mg/dL — ABNORMAL HIGH (ref 70–99)
Potassium: 3.6 mmol/L (ref 3.5–5.1)
SODIUM: 141 mmol/L (ref 135–145)
Total Bilirubin: 0.6 mg/dL (ref 0.3–1.2)
Total Protein: 4.6 g/dL — ABNORMAL LOW (ref 6.5–8.1)

## 2018-07-12 LAB — CBC
HCT: 30.3 % — ABNORMAL LOW (ref 39.0–52.0)
Hemoglobin: 8.7 g/dL — ABNORMAL LOW (ref 13.0–17.0)
MCH: 25.1 pg — ABNORMAL LOW (ref 26.0–34.0)
MCHC: 28.7 g/dL — ABNORMAL LOW (ref 30.0–36.0)
MCV: 87.6 fL (ref 80.0–100.0)
PLATELETS: 306 10*3/uL (ref 150–400)
RBC: 3.46 MIL/uL — ABNORMAL LOW (ref 4.22–5.81)
RDW: 18.5 % — ABNORMAL HIGH (ref 11.5–15.5)
WBC: 9.3 10*3/uL (ref 4.0–10.5)
nRBC: 0 % (ref 0.0–0.2)

## 2018-07-12 LAB — GLUCOSE, CAPILLARY
GLUCOSE-CAPILLARY: 96 mg/dL (ref 70–99)
Glucose-Capillary: 86 mg/dL (ref 70–99)
Glucose-Capillary: 138 mg/dL — ABNORMAL HIGH (ref 70–99)

## 2018-07-12 MED ORDER — SIMETHICONE 80 MG PO CHEW: 80.0000 mg | CHEWABLE_TABLET | Freq: Four times a day (QID) | ORAL | Status: DC | PRN

## 2018-07-12 MED ORDER — BISACODYL 10 MG RE SUPP
10.0000 mg | Freq: Once | RECTAL | Status: AC
  Administered 2018-07-12: 10 mg via RECTAL
  Filled 2018-07-12: qty 1

## 2018-07-12 MED ORDER — CYCLOBENZAPRINE HCL 10 MG PO TABS: 10.0000 mg | ORAL_TABLET | Freq: Two times a day (BID) | ORAL | 0 refills | Status: AC | PRN

## 2018-07-12 MED ORDER — ACETAMINOPHEN 325 MG PO TABS: 650.0000 mg | ORAL_TABLET | ORAL | 0 refills | Status: DC | PRN

## 2018-07-12 MED ORDER — OXYCODONE HCL 10 MG PO TABS: 10.0000 mg | ORAL_TABLET | Freq: Four times a day (QID) | ORAL | 0 refills | Status: AC | PRN

## 2018-07-12 MED ORDER — NICOTINE 14 MG/24HR TD PT24: 14.0000 mg | MEDICATED_PATCH | Freq: Every day | TRANSDERMAL | 0 refills | Status: DC

## 2018-07-12 MED ORDER — SENNOSIDES-DOCUSATE SODIUM 8.6-50 MG PO TABS: 1.0000 | ORAL_TABLET | Freq: Two times a day (BID) | ORAL | 1 refills | Status: DC | PRN

## 2018-07-12 MED ORDER — LEVEMIR FLEXTOUCH 100 UNIT/ML ~~LOC~~ SOPN: 8.0000 [IU] | PEN_INJECTOR | Freq: Every evening | SUBCUTANEOUS | 11 refills | Status: DC

## 2018-07-12 NOTE — TOC Progression Note (Addendum)
Transition of Care [facility]) - Progression Note    Patient Details  Name: Henry Mcpherson MRN: [id number] Date of Birth: 1956/04/17  Transition of Care [facility]) CM/SW Contact  Henry Mcpherson, [place] Phone Number: 07/12/2018, 9:57 AM  Clinical Narrative: Received a call from [facility], they are reviewing pt referral with their intake RN and will return call to CSW when decision made.  Henry Mcpherson also contacted this Clinical research associate, they are unable to offer due to lack of available beds until 3/18.   Will reach out to pt wife for additional placement options in order to facilitate placement.   Expected Discharge Plan: Skilled Nursing Facility Barriers to Discharge: English as a second language teacher, Continued Medical Work up  Expected Discharge Plan and Services Expected Discharge Plan: Skilled Nursing Facility Discharge Planning Services: DC out of service area Post Acute Care Choice: Skilled Nursing Facility Living arrangements for the past 2 months: Single Family Home                 DME Arranged: N/A DME Agency: NA HH Arranged: NA HH Agency: NA   Social Determinants of Health (SDOH) Interventions    Readmission Risk Interventions 30 Day Unplanned Readmission Risk Score     ED to Hosp-Admission (Current) from 06/29/2018 in [place] 4 NORTH PROGRESSIVE CARE  30 Day Unplanned Readmission Risk Score (%)  18 Filed at 07/12/2018 0801     This score is the patient's risk of an unplanned readmission within 30 days of being discharged (0 -100%). The score is based on dignosis, age, lab data, medications, orders, and past utilization.   Low:  0-14.9   Medium: 15-21.9   High: 22-29.9   Extreme: 30 and above       No flowsheet data found.

## 2018-07-12 NOTE — Discharge Instructions (Signed)
Thank you for allowing [place] taking care of you at [facility]. We are glad that you are doing well after surgery. We discharge you to skill nursing facility for rehab. We decrease your Insulin dose based on your sugar value in the hospital. Please make sure to get an appointment and see your PCP within next week. and follow up with your primary care for your diabetes control and with your neurosurgeon for post op evaluation.  If you have any question, you can call us at [phone] Thank you

## 2018-07-12 NOTE — Progress Notes (Signed)
Spoke with therapies they want to work with the patient. Patient complaining of pain, Oxy 10 mg given @ 0645 this morning. Nurse expressed dilaudid would be given around time therapies come back to work with patient.   Flexeril and tylenol given to patient in the mean time.   Patient educated on movement and how that works for pain management.   Nurse went back through documentation and patient has no documented bm besides smears on my shift since Monday since 07/03/2018.   Dulcolax suppository order put in.

## 2018-07-12 NOTE — Progress Notes (Signed)
Occupational Therapy Treatment Patient Details Name: Henry Mcpherson MRN: [id number] DOB: 14-Apr-1956 Today's Date: 07/12/2018    History of present illness Pt is a 63 yo male s/p L3-S1 redo compressive lumbar laminectomies, foraminotomies L3-S1 nerve roots, nonsegmental fixation L3-S1 and posterior lateral arthrodesis L3-S1 after progressive LBP, LE weakness and fecal incontinence. PMH: lumbar surgery, HTN, DM, COPD, CHF   OT comments  This 63 yo male admitted with above presents to acute OT with a little better with bed mobility, sit<>stand with sara stedy, and OOB to recliner today (but not tolerating well despite all pain meds in him that he can have for now). He will continue to benefit from acute OT with follow up OT at SNF.  Follow Up Recommendations  SNF;Supervision/Assistance - 24 hour    Equipment Recommendations  Other (comment)(TBD next venue)       Precautions / Restrictions Precautions Precautions: Back Precaution Booklet Issued: Yes (comment) Precaution Comments: Pt with vertigo upon sitting up on right hand side of bed and rolling to left (disapated quickly) Required Braces or Orthoses: Spinal Brace Spinal Brace: Lumbar corset;Applied in sitting position(total A to don/doff) Restrictions Weight Bearing Restrictions: No       Mobility Bed Mobility Overal bed mobility: Needs Assistance Bed Mobility: Rolling;Sidelying to Sit;Sit to Sidelying Rolling: Mod assist;+2 for physical assistance Sidelying to sit: Max assist;+2 for physical assistance     Sit to sidelying: Total assist;+2 for physical assistance General bed mobility comments: increased time and effort, cuing for sequencing,  Transfers Overall transfer level: Needs assistance   Transfers: Sit to/from Stand Sit to Stand: Mod assist;+2 physical assistance         General transfer comment: raised bed and use of bed pad    Balance Overall balance assessment: Needs assistance Sitting-balance  support: Bilateral upper extremity supported;Feet unsupported Sitting balance-Leahy Scale: Zero Sitting balance - Comments: pt pushing posteriorly spontaneously initally, but then could sit upright but required use of Bil UEs to maintain sitting                                   ADL either performed or assessed with clinical judgement   ADL Overall ADL's : Needs assistance/impaired                                       General ADL Comments: total A for ADLs unsupported sitting (needs Bil UEs to support self in sitting) with +2 sit<>stand (raised bed) and use of Corene Cornea; supported sitting/bed level min A for grooming/UBB     Vision Patient Visual Report: No change from baseline            Cognition Arousal/Alertness: Awake/alert Behavior During Therapy: Restless(due to pain) Overall Cognitive Status: Impaired/Different from baseline Area of Impairment: Safety/judgement                         Safety/Judgement: Decreased awareness of safety;Decreased awareness of deficits     General Comments: Does require verbal cues for sequencing; up standing and then sitting on seat of Corene Cornea he started to lean backwards thinking he was back sitting on bed                   Pertinent Vitals/ Pain       Pain Assessment:  0-10 Pain Score: 10-Worst pain ever Pain Location: back and down legs to knees ("sciatica feeling") Pain Descriptors / Indicators: Aching;Burning;Tightness Pain Intervention(s): Limited activity within patient's tolerance;Monitored during session;Repositioned         Frequency  Min 2X/week        Progress Toward Goals  OT Goals(current goals can now be found in the care plan section)  Progress towards OT goals: Progressing toward goals     Plan Discharge plan remains appropriate       AM-PAC OT "6 Clicks" Daily Activity     Outcome Measure   Help from another person eating meals?: A Little Help from  another person taking care of personal grooming?: A Little Help from another person toileting, which includes using toliet, bedpan, or urinal?: Total Help from another person bathing (including washing, rinsing, drying)?: A Lot Help from another person to put on and taking off regular upper body clothing?: A Lot Help from another person to put on and taking off regular lower body clothing?: Total 6 Click Score: 12    End of Session Equipment Utilized During Treatment: Gait belt;Back brace  OT Visit Diagnosis: Muscle weakness (generalized) (M62.81)   Activity Tolerance Patient tolerated treatment well   Patient Left in chair;with call bell/phone within reach;with chair alarm set;with family/visitor present   Nurse Communication Mobility status;Need for lift equipment(Maxi Move)        Time: 3202-3343 OT Time Calculation (min): 39 min  Charges: OT General Charges $OT Visit: 1 Visit OT Treatments $Self Care/Home Management : 38-52 mins  Henry Mcpherson, OTR/L Acute [employer] Pager [phone] Office [phone]     Henry Mcpherson 07/12/2018, 9:50 AM

## 2018-07-12 NOTE — Progress Notes (Signed)
   Subjective: Henry Mcpherson was seen and evaluated this morning.  He feels much better and mentions that he was able to sit on the chair.  Wife is in the room. We talked about importance of ambulation.  Talked about plan of care.     Objective:  Vital signs in last 24 hours: Vitals:   07/11/18 2000 07/11/18 2321 07/12/18 0827 07/12/18 1218  BP: 99/86 (!) 125/92 114/85 119/71  Pulse:  99 68 (!) 101  Resp:   16   Temp: 97.8 F (36.6 C) (!) 97.5 F (36.4 C) 98.3 F (36.8 C) 97.6 F (36.4 C)  TempSrc: Oral Oral Oral Oral  SpO2: 94% 98% 91% 97%  Weight:      Height:       Physical exam: General: No acute distress CV: No murmur Lungs: CTA, respiratory distress Abdomen: Is soft and nontender to palpation Extremities: trace nonpitting edema at Left side Neurology exam: Patient is alert and oriented today Assessment/Plan:  Principal Problem:   Cauda equina syndrome (HCC) Active Problems:   Community acquired pneumonia   Chronic obstructive lung disease (HCC)   Controlled type 2 diabetes mellitus without complication, without long-term current use of insulin (HCC)   Spinal stenosis of lumbar region   Spinal stenosis at L4-L5 level   Malnutrition of moderate degree  Cauda equina syndrome secondary to severe spinal stenosis Status post pression surgery.  Postop day 7 Pain has been controlled. Ambulation is gradually improving.  He was able to sit on the chair, but is still does not walk a lot -He is medically stable to discharge to SNF for rehab -Continue physical therapy and chronic medical condition treatment meanwhile -Continue Lovenox for DVT prophylaxis -Continue PRN pain meds -Social worker is following insurance authorization for SNF placement.  Patient has a bed in SNF in [place].  CXR is required for admission in facility per social worker. Placed the order.  Delirium:Significantly improved He is alert and oriented today.  CMP unremarkable today, and transaminitis  improved. - Delirium precautions - Minimize sedation as much as possible  - Day / night re orientation  -CMP daily  Dispo: Henry Mcpherson stable to discharge to [facility]   Henry Pretty, MD 07/12/2018, 1:06 PM Pager: [phone]

## 2018-07-12 NOTE — TOC Progression Note (Addendum)
Transition of Care [facility]) - Progression Note    Patient Details  Name: Henry Mcpherson MRN: [id number] Date of Birth: [birth year]  Transition of Care [facility]) CM/SW Contact  Henry Mcpherson, [place] Phone Number: 07/12/2018, 12:48 PM  Clinical Narrative:    12:48pm- Henry Mcpherson is able to offer for pt placement. They are f/u to initiate insurance approval for pt. Pt will need this insurance auth before d/c to SNF. CSW will let pt wife know. SNF also requesting chest x ray for pt prior to admission. This is required for facility.   Will also get estimate for [facility] transport.  1:13pm- Spoke with pt wife she is pleased with facility offer, she would like to arrange her own transport after receiving [facility] estimate of [phone].  MD will order chest x ray and we will cotinue to follow for placement approval through [employer].  3:36pm- Pt has insurance approval and will able to d/c tomorrow to [facility]. Spoke with Henry Mcpherson in admissions, she requests discharge information completed today. Pt wife aware and will arrange pick up for transport company at [employer]. When chest x ray results ready I will send them also to [place].    Expected Discharge Plan: Skilled Nursing Facility Barriers to Discharge: English as a second language teacher, Continued Medical Work up  Expected Discharge Plan and Services Expected Discharge Plan: Skilled Nursing Facility Discharge Planning Services: DC out of service area Post Acute Care Choice: Skilled Nursing Facility Living arrangements for the past 2 months: Single Family Home                 DME Arranged: N/A DME Agency: NA HH Arranged: NA HH Agency: NA   Social Determinants of Health (SDOH) Interventions    Readmission Risk Interventions 30 Day Unplanned Readmission Risk Score     ED to Hosp-Admission (Current) from 06/29/2018 in [place] 4 NORTH PROGRESSIVE CARE  30 Day Unplanned Readmission Risk Score (%)  18 Filed at 07/12/2018  1200     This score is the patient's risk of an unplanned readmission within 30 days of being discharged (0 -100%). The score is based on dignosis, age, lab data, medications, orders, and past utilization.   Low:  0-14.9   Medium: 15-21.9   High: 22-29.9   Extreme: 30 and above       No flowsheet data found.

## 2018-07-12 NOTE — Progress Notes (Signed)
Subjective: Patient reports doing well improved pain improved strength lower extremities  Objective: Vital signs in last 24 hours: Temp:  [97.5 F (36.4 C)-98.1 F (36.7 C)] 97.5 F (36.4 C) (03/10 2321) Pulse Rate:  [80-105] 99 (03/10 2321) Resp:  [22] 22 (03/10 0821) BP: (99-127)/(74-92) 125/92 (03/10 2321) SpO2:  [89 %-98 %] 98 % (03/10 2321)  Intake/Output from previous day: 03/10 0701 - 03/11 0700 In: 320 [P.O.:320] Out: 650 [Urine:650] Intake/Output this shift: No intake/output data recorded.  well over antigravity strength right lower extremity now left portable plus out of 5 incision clean dry and intact  Lab Results: Recent Labs    07/11/18 0712 07/12/18 0616  WBC 10.9* 9.3  HGB 8.3* 8.7*  HCT 28.3* 30.3*  PLT 303 306   BMET Recent Labs    07/11/18 0712 07/12/18 0616  NA 138 141  K 3.4* 3.6  CL 100 104  CO2 27 28  GLUCOSE 98 103*  BUN 20 17  CREATININE 0.71 0.76  CALCIUM 8.3* 8.4*    Studies/Results: US Abdomen Complete  Result Date: 07/10/2018 CLINICAL DATA:  Transaminitis. EXAM: ABDOMEN ULTRASOUND COMPLETE COMPARISON:  None. FINDINGS: Gallbladder: No gallstones. Borderline gallbladder wall thickening measuring 3.3 mm. No sonographic Murphy sign noted by sonographer. Common bile duct: Diameter: 2.4 mm Liver: No focal lesion identified. Coarse parenchymal echogenicity. Left lobe is obscured by bowel gas. Portal vein is patent on color Doppler imaging with normal direction of blood flow towards the liver. IVC: No abnormality visualized. Pancreas: Obscured by bowel gas. Spleen: Size and appearance within normal limits. Right Kidney: Length: 11.9 cm. Thinning of the renal cortex noted. Two benign-appearing cysts is, the larger measuring 2.5 cm. Left Kidney: Length: 12.8 cm. Thinning of the renal cortex noted. No mass or hydronephrosis visualized. Abdominal aorta: No aneurysm visualized. Other findings: None. IMPRESSION: Coarse echogenicity of the liver usually  associated with hepatic steatosis or fibrosis. Borderline thickening of the gallbladder wall, nonspecific in the absence of other signs of acute cholecystitis. Bilateral renal cortical thinning, usually associated with medical renal disease. Two benign-appearing right renal cysts, the larger measuring 2.5 cm. Electronically Signed   By: Ted Mcalpine M.D.   On: 07/10/2018 12:37    Assessment/Plan: Continue to mobilize with physical occupational therapy awaiting rehabilitation placement  LOS: 13 days     Tonica Brasington P 07/12/2018, 8:07 AM

## 2018-07-13 LAB — COMPREHENSIVE METABOLIC PANEL
ALT: 50 U/L — ABNORMAL HIGH (ref 0–44)
AST: 31 U/L (ref 15–41)
Albumin: 1.7 g/dL — ABNORMAL LOW (ref 3.5–5.0)
Alkaline Phosphatase: 91 U/L (ref 38–126)
Anion gap: 6 (ref 5–15)
BUN: 15 mg/dL (ref 8–23)
CO2: 30 mmol/L (ref 22–32)
Calcium: 8.2 mg/dL — ABNORMAL LOW (ref 8.9–10.3)
Chloride: 105 mmol/L (ref 98–111)
Creatinine, Ser: 0.7 mg/dL (ref 0.61–1.24)
GFR calc Af Amer: >60 mL/min (ref >60)
GFR calc non Af Amer: >60 mL/min (ref >60)
Glucose, Bld: 42 mg/dL — CL (ref 70–99)
Potassium: 3.4 mmol/L — ABNORMAL LOW (ref 3.5–5.1)
Sodium: 141 mmol/L (ref 135–145)
Total Bilirubin: 0.5 mg/dL (ref 0.3–1.2)
Total Protein: 4.9 g/dL — ABNORMAL LOW (ref 6.5–8.1)

## 2018-07-13 LAB — CBC
HCT: 28 % — ABNORMAL LOW (ref 39.0–52.0)
Hemoglobin: 8.4 g/dL — ABNORMAL LOW (ref 13.0–17.0)
MCH: 25.8 pg — ABNORMAL LOW (ref 26.0–34.0)
MCHC: 30 g/dL (ref 30.0–36.0)
MCV: 86.2 fL (ref 80.0–100.0)
Platelets: 403 10*3/uL — ABNORMAL HIGH (ref 150–400)
RBC: 3.25 MIL/uL — ABNORMAL LOW (ref 4.22–5.81)
RDW: 18.6 % — ABNORMAL HIGH (ref 11.5–15.5)
WBC: 15.6 10*3/uL — ABNORMAL HIGH (ref 4.0–10.5)
nRBC: 0 % (ref 0.0–0.2)

## 2018-07-13 LAB — GLUCOSE, CAPILLARY
Glucose-Capillary: 42 mg/dL — CL (ref 70–99)
Glucose-Capillary: 81 mg/dL (ref 70–99)
Glucose-Capillary: 131 mg/dL — ABNORMAL HIGH (ref 70–99)

## 2018-07-13 MED ORDER — DEXTROSE 50 % IV SOLN
INTRAVENOUS | Status: AC
  Filled 2018-07-13: qty 50

## 2018-07-13 MED ORDER — POTASSIUM CHLORIDE CRYS ER 20 MEQ PO TBCR
40.0000 meq | EXTENDED_RELEASE_TABLET | Freq: Once | ORAL | Status: AC
  Administered 2018-07-13: 40 meq via ORAL
  Filled 2018-07-13: qty 2

## 2018-07-13 NOTE — Progress Notes (Signed)
Report called to receiving Faythe Casa Nurse, Domingo Sep, LPN @ [phone]. All questions answered. Patient belongings given to the private transport personal who reports list of things was given to them by patient's  wife. Clothes, CPAP, cell phone and charger remitted and verified by [employer]. Susan Arana Ruthe Mannan, BSN, RN

## 2018-07-13 NOTE — TOC Transition Note (Signed)
Transition of Care [facility]) - CM/SW Discharge Note Patient Details  Name: Henry Mcpherson MRN: [id number] Date of Birth: [birth year]  Transition of Care [facility]) CM/SW Contact:  Doy Hutching, LCSWA Phone Number: 07/13/2018, 10:21 AM   Clinical Narrative:    Pt was discharged via privately arranged transport to [facility]. Pt RN called report to main number. Pt wife aware, signing off.   Final next level of care: Skilled Nursing Facility Barriers to Discharge: Barriers Resolved   Patient Goals and CMS Choice Patient states their goals for this hospitalization and ongoing recovery are:: "for him to not become bedbound"- pt wife CMS Medicare.gov Compare Post Acute Care list provided to:: Patient Represenative (must comment)(pt wife) Choice offered to / list presented to : Spouse  Discharge Placement   Existing PASRR number confirmed : (n/a plan for d/c in [place])          Patient chooses bed at: Other - please specify in the comment section below:([facility]) Patient to be transferred to facility by: [facility] [place]) Name of family member notified: pt wife [name] Patient and family notified of of transfer: 07/13/18  Discharge Plan and Services Discharge Planning Services: DC out of service area Post Acute Care Choice: Skilled Nursing Facility          DME Arranged: N/A DME Agency: NA HH Arranged: NA HH Agency: NA   Social Determinants of Health (SDOH) Interventions     Readmission Risk Interventions No flowsheet data found.

## 2018-07-13 NOTE — Progress Notes (Signed)
Hypoglycemic Event  CBG: 42  Treatment: 4 oz juice/soda  Symptoms: Sweaty  Follow-up CBG: Time:0613 CBG Result:81  Possible Reasons for Event: Inadequate meal intake and Other: Multiple bowel movements  Comments/MD notified:Rechecked pt Blood sugar after 4oz of juice and crackers and it came up above 70 will cont. To monitor and pass on to day shift     [place] R Khya Halls

## 2018-07-13 NOTE — Progress Notes (Signed)
   Subjective: Patient was seen and evaluated at bedside on morning rounds. Had some hypoglycemia this AM with some due to not eating enough last night and this AM. He denies any dizziness or n/v now. Henry Mcpherson He feels well and mentions that the pain is better.  Objective:  Vital signs in last 24 hours: Vitals:   07/12/18 1218 07/12/18 1600 07/12/18 2000 07/13/18 0501  BP: 119/71 134/90 (!) 139/96 127/80  Pulse: (!) 101 (!) 102  (!) 101  Resp:      Temp: 97.6 F (36.4 C) 97.9 F (36.6 C) 98.5 F (36.9 C) 97.7 F (36.5 C)  TempSrc: Oral Oral Oral Oral  SpO2: 97% 90% 90% 95%  Weight:      Height:       Physical exam: General: No acute distress CV: No murmur Lungs: No respiratory distress Abdomen: Is soft and nontender to palpation Extremities: trace nonpitting edema at Left side Neurology exam: Patient is alert and oriented today  Assessment/Plan:  Principal Problem:   Cauda equina syndrome (HCC) Active Problems:   Community acquired pneumonia   Chronic obstructive lung disease (HCC)   Controlled type 2 diabetes mellitus without complication, without long-term current use of insulin (HCC)   Spinal stenosis of lumbar region   Spinal stenosis at L4-L5 level   Malnutrition of moderate degree  Physical exam: General: No acute distress CV: No murmur Lungs: CTA, respiratory distress Abdomen: Is soft and nontender to palpation Extremities: trace nonpitting edema at Left side Neurology exam: Patient is alert and oriented today Assessment/Plan:  Principal Problem:   Cauda equina syndrome (HCC) Active Problems:   Community acquired pneumonia   Chronic obstructive lung disease (HCC)   Controlled type 2 diabetes mellitus without complication, without long-term current use of insulin (HCC)   Spinal stenosis of lumbar region   Spinal stenosis at L4-L5 level   Malnutrition of moderate degree  Cauda equina syndrome secondary to severe spinal stenosis Status post pression  surgery.  Postop day  Pain has been controlled.  -Discharge to SNF in [place] for rehab today -OXY IR for 5-7 days after discharge -f/u with neurosurgery out patient  DM: Holding Insulin at discharge due to episode of hypoglycemia this AM at 43. Due to he did not eat well last night and this AM. -F/u out patient for BMP check and DM management plan.  Delirium:Resolved.   Dispo: DC to SNF today   Chevis Pretty, MD 07/13/2018, 6:35 AM Pager: [phone]

## 2018-07-13 NOTE — Plan of Care (Signed)
  Problem: Education: Goal: Knowledge of General Education information will improve Description Including pain rating scale, medication(s)/side effects and non-pharmacologic comfort measures Outcome: Adequate for Discharge   Problem: Health Behavior/Discharge Planning: Goal: Ability to manage health-related needs will improve Outcome: Adequate for Discharge   Problem: Activity: Goal: Risk for activity intolerance will decrease Outcome: Adequate for Discharge   Problem: Pain Managment: Goal: General experience of comfort will improve Outcome: Adequate for Discharge   Problem: Skin Integrity: Goal: Risk for impaired skin integrity will decrease Outcome: Adequate for Discharge   Problem: Activity: Goal: Ability to tolerate increased activity will improve Outcome: Adequate for Discharge

## 2018-10-09 NOTE — Plan of Care (Addendum)
Transferring from [place]. Henry Mcpherson is a 63 year old male with pmh CHF, A. fib, tachybradycardia syndrome s/p PM, COPD, DM type II, PUD, lumbar stenosis, and cauda equina syndrome s/p lumbar decompression and fusion by Dr. Saintclair Halsted on 3/4; who presented to the outside hospital for diabetic toe infection.  Patient was seen by podiatry at the facility and status post resection.  Blood cultures at the facility were positive for MRSA bacteremia.  Patient treated with 11 days of vancomycin and repeat cultures were noted to be negative.  Also reported to have been transfused 1 unit of packed red blood cells and stool guaiacs were noted to be positive.  Vital signs currently stable.  Labs note WBC 13.2 (trending down) and hemoglobin 7.8 which is reportedly near his baseline.  Neurosurgery needs to be called upon arrival transferring to a MedSurg bed.  Reported COVID-19 screening negative.

## 2018-10-10 ENCOUNTER — Inpatient Hospital Stay (HOSPITAL_COMMUNITY)
Admission: AD | Admit: 2018-10-10 | Discharge: 2018-10-25 | DRG: 260 | Disposition: A | Discharge status: Home Health | Payer: Medicare Other | Source: Other Acute Inpatient Hospital | Attending: Internal Medicine | Admitting: Internal Medicine

## 2018-10-10 DIAGNOSIS — T827XXA Infection and inflammatory reaction due to other cardiac and vascular devices, implants and grafts, initial encounter: Secondary | ICD-10-CM

## 2018-10-10 DIAGNOSIS — E785 Hyperlipidemia, unspecified: Secondary | ICD-10-CM | POA: Diagnosis not present

## 2018-10-10 DIAGNOSIS — T84226A Displacement of internal fixation device of vertebrae, initial encounter: Secondary | ICD-10-CM | POA: Diagnosis present

## 2018-10-10 DIAGNOSIS — Z950 Presence of cardiac pacemaker: Secondary | ICD-10-CM | POA: Diagnosis not present

## 2018-10-10 DIAGNOSIS — E1169 Type 2 diabetes mellitus with other specified complication: Secondary | ICD-10-CM | POA: Diagnosis present

## 2018-10-10 DIAGNOSIS — M4626 Osteomyelitis of vertebra, lumbar region: Secondary | ICD-10-CM | POA: Diagnosis not present

## 2018-10-10 DIAGNOSIS — M4645 Discitis, unspecified, thoracolumbar region: Secondary | ICD-10-CM

## 2018-10-10 DIAGNOSIS — Z89412 Acquired absence of left great toe: Secondary | ICD-10-CM | POA: Diagnosis present

## 2018-10-10 DIAGNOSIS — Z8711 Personal history of peptic ulcer disease: Secondary | ICD-10-CM

## 2018-10-10 DIAGNOSIS — I4819 Other persistent atrial fibrillation: Secondary | ICD-10-CM | POA: Diagnosis not present

## 2018-10-10 DIAGNOSIS — L089 Local infection of the skin and subcutaneous tissue, unspecified: Secondary | ICD-10-CM | POA: Diagnosis not present

## 2018-10-10 DIAGNOSIS — Y831 Surgical operation with implant of artificial internal device as the cause of abnormal reaction of the patient, or of later complication, without mention of misadventure at the time of the procedure: Secondary | ICD-10-CM | POA: Diagnosis present

## 2018-10-10 DIAGNOSIS — I081 Rheumatic disorders of both mitral and tricuspid valves: Secondary | ICD-10-CM | POA: Diagnosis present

## 2018-10-10 DIAGNOSIS — I4821 Permanent atrial fibrillation: Secondary | ICD-10-CM | POA: Diagnosis present

## 2018-10-10 DIAGNOSIS — M4807 Spinal stenosis, lumbosacral region: Secondary | ICD-10-CM | POA: Diagnosis present

## 2018-10-10 DIAGNOSIS — M4625 Osteomyelitis of vertebra, thoracolumbar region: Secondary | ICD-10-CM | POA: Diagnosis present

## 2018-10-10 DIAGNOSIS — R7881 Bacteremia: Secondary | ICD-10-CM | POA: Diagnosis not present

## 2018-10-10 DIAGNOSIS — Z981 Arthrodesis status: Secondary | ICD-10-CM | POA: Diagnosis not present

## 2018-10-10 DIAGNOSIS — Z20828 Contact with and (suspected) exposure to other viral communicable diseases: Secondary | ICD-10-CM | POA: Diagnosis present

## 2018-10-10 DIAGNOSIS — I4510 Unspecified right bundle-branch block: Secondary | ICD-10-CM | POA: Diagnosis present

## 2018-10-10 DIAGNOSIS — Y752 Prosthetic and other implants, materials and neurological devices associated with adverse incidents: Secondary | ICD-10-CM | POA: Diagnosis present

## 2018-10-10 DIAGNOSIS — I330 Acute and subacute infective endocarditis: Secondary | ICD-10-CM | POA: Diagnosis present

## 2018-10-10 DIAGNOSIS — I495 Sick sinus syndrome: Secondary | ICD-10-CM | POA: Diagnosis present

## 2018-10-10 DIAGNOSIS — I472 Ventricular tachycardia: Secondary | ICD-10-CM | POA: Diagnosis not present

## 2018-10-10 DIAGNOSIS — G834 Cauda equina syndrome: Secondary | ICD-10-CM | POA: Diagnosis present

## 2018-10-10 DIAGNOSIS — M4644 Discitis, unspecified, thoracic region: Secondary | ICD-10-CM | POA: Diagnosis not present

## 2018-10-10 DIAGNOSIS — M4624 Osteomyelitis of vertebra, thoracic region: Secondary | ICD-10-CM | POA: Diagnosis not present

## 2018-10-10 DIAGNOSIS — I110 Hypertensive heart disease with heart failure: Secondary | ICD-10-CM | POA: Diagnosis present

## 2018-10-10 DIAGNOSIS — Z8739 Personal history of other diseases of the musculoskeletal system and connective tissue: Secondary | ICD-10-CM | POA: Diagnosis not present

## 2018-10-10 DIAGNOSIS — I339 Acute and subacute endocarditis, unspecified: Secondary | ICD-10-CM | POA: Diagnosis not present

## 2018-10-10 DIAGNOSIS — L89322 Pressure ulcer of left buttock, stage 2: Secondary | ICD-10-CM | POA: Diagnosis present

## 2018-10-10 DIAGNOSIS — L8990 Pressure ulcer of unspecified site, unspecified stage: Secondary | ICD-10-CM

## 2018-10-10 DIAGNOSIS — E1140 Type 2 diabetes mellitus with diabetic neuropathy, unspecified: Secondary | ICD-10-CM | POA: Diagnosis present

## 2018-10-10 DIAGNOSIS — I482 Chronic atrial fibrillation, unspecified: Secondary | ICD-10-CM | POA: Diagnosis not present

## 2018-10-10 DIAGNOSIS — R001 Bradycardia, unspecified: Secondary | ICD-10-CM | POA: Diagnosis not present

## 2018-10-10 DIAGNOSIS — M4646 Discitis, unspecified, lumbar region: Secondary | ICD-10-CM | POA: Diagnosis not present

## 2018-10-10 DIAGNOSIS — D638 Anemia in other chronic diseases classified elsewhere: Secondary | ICD-10-CM | POA: Diagnosis present

## 2018-10-10 DIAGNOSIS — J869 Pyothorax without fistula: Secondary | ICD-10-CM | POA: Diagnosis not present

## 2018-10-10 DIAGNOSIS — I5022 Chronic systolic (congestive) heart failure: Secondary | ICD-10-CM | POA: Diagnosis present

## 2018-10-10 DIAGNOSIS — Z9689 Presence of other specified functional implants: Secondary | ICD-10-CM

## 2018-10-10 DIAGNOSIS — I5032 Chronic diastolic (congestive) heart failure: Secondary | ICD-10-CM | POA: Diagnosis present

## 2018-10-10 DIAGNOSIS — Y712 Prosthetic and other implants, materials and accessory cardiovascular devices associated with adverse incidents: Secondary | ICD-10-CM | POA: Diagnosis present

## 2018-10-10 DIAGNOSIS — Z959 Presence of cardiac and vascular implant and graft, unspecified: Secondary | ICD-10-CM

## 2018-10-10 DIAGNOSIS — K219 Gastro-esophageal reflux disease without esophagitis: Secondary | ICD-10-CM | POA: Diagnosis present

## 2018-10-10 DIAGNOSIS — M4805 Spinal stenosis, thoracolumbar region: Secondary | ICD-10-CM | POA: Diagnosis present

## 2018-10-10 DIAGNOSIS — K922 Gastrointestinal hemorrhage, unspecified: Secondary | ICD-10-CM | POA: Diagnosis present

## 2018-10-10 DIAGNOSIS — Z79899 Other long term (current) drug therapy: Secondary | ICD-10-CM

## 2018-10-10 DIAGNOSIS — I2510 Atherosclerotic heart disease of native coronary artery without angina pectoris: Secondary | ICD-10-CM | POA: Diagnosis present

## 2018-10-10 DIAGNOSIS — E11649 Type 2 diabetes mellitus with hypoglycemia without coma: Secondary | ICD-10-CM | POA: Diagnosis not present

## 2018-10-10 DIAGNOSIS — E118 Type 2 diabetes mellitus with unspecified complications: Secondary | ICD-10-CM | POA: Diagnosis not present

## 2018-10-10 DIAGNOSIS — R222 Localized swelling, mass and lump, trunk: Secondary | ICD-10-CM

## 2018-10-10 DIAGNOSIS — I10 Essential (primary) hypertension: Secondary | ICD-10-CM | POA: Diagnosis present

## 2018-10-10 DIAGNOSIS — Z888 Allergy status to other drugs, medicaments and biological substances status: Secondary | ICD-10-CM

## 2018-10-10 DIAGNOSIS — A4902 Methicillin resistant Staphylococcus aureus infection, unspecified site: Secondary | ICD-10-CM | POA: Diagnosis present

## 2018-10-10 DIAGNOSIS — I493 Ventricular premature depolarization: Secondary | ICD-10-CM | POA: Diagnosis present

## 2018-10-10 DIAGNOSIS — I499 Cardiac arrhythmia, unspecified: Secondary | ICD-10-CM | POA: Diagnosis not present

## 2018-10-10 DIAGNOSIS — E119 Type 2 diabetes mellitus without complications: Secondary | ICD-10-CM | POA: Diagnosis not present

## 2018-10-10 DIAGNOSIS — Z8619 Personal history of other infectious and parasitic diseases: Secondary | ICD-10-CM | POA: Diagnosis not present

## 2018-10-10 DIAGNOSIS — I700 Atherosclerosis of aorta: Secondary | ICD-10-CM | POA: Diagnosis present

## 2018-10-10 DIAGNOSIS — H04123 Dry eye syndrome of bilateral lacrimal glands: Secondary | ICD-10-CM | POA: Diagnosis not present

## 2018-10-10 DIAGNOSIS — Z452 Encounter for adjustment and management of vascular access device: Secondary | ICD-10-CM

## 2018-10-10 DIAGNOSIS — R601 Generalized edema: Secondary | ICD-10-CM | POA: Diagnosis not present

## 2018-10-10 DIAGNOSIS — J449 Chronic obstructive pulmonary disease, unspecified: Secondary | ICD-10-CM

## 2018-10-10 DIAGNOSIS — B9562 Methicillin resistant Staphylococcus aureus infection as the cause of diseases classified elsewhere: Secondary | ICD-10-CM | POA: Diagnosis present

## 2018-10-10 DIAGNOSIS — D500 Iron deficiency anemia secondary to blood loss (chronic): Secondary | ICD-10-CM | POA: Diagnosis present

## 2018-10-10 DIAGNOSIS — Z8709 Personal history of other diseases of the respiratory system: Secondary | ICD-10-CM

## 2018-10-10 DIAGNOSIS — R195 Other fecal abnormalities: Secondary | ICD-10-CM | POA: Diagnosis present

## 2018-10-10 DIAGNOSIS — M48061 Spinal stenosis, lumbar region without neurogenic claudication: Secondary | ICD-10-CM | POA: Diagnosis present

## 2018-10-10 DIAGNOSIS — I340 Nonrheumatic mitral (valve) insufficiency: Secondary | ICD-10-CM | POA: Diagnosis not present

## 2018-10-10 DIAGNOSIS — Z794 Long term (current) use of insulin: Secondary | ICD-10-CM

## 2018-10-10 DIAGNOSIS — Z87891 Personal history of nicotine dependence: Secondary | ICD-10-CM | POA: Diagnosis not present

## 2018-10-10 DIAGNOSIS — Z951 Presence of aortocoronary bypass graft: Secondary | ICD-10-CM

## 2018-10-10 DIAGNOSIS — Z872 Personal history of diseases of the skin and subcutaneous tissue: Secondary | ICD-10-CM | POA: Diagnosis not present

## 2018-10-10 DIAGNOSIS — M4649 Discitis, unspecified, multiple sites in spine: Secondary | ICD-10-CM | POA: Diagnosis present

## 2018-10-10 DIAGNOSIS — D649 Anemia, unspecified: Secondary | ICD-10-CM | POA: Diagnosis present

## 2018-10-10 DIAGNOSIS — Z955 Presence of coronary angioplasty implant and graft: Secondary | ICD-10-CM

## 2018-10-10 HISTORY — DX: Osteomyelitis, unspecified: M86.9

## 2018-10-10 HISTORY — DX: Sleep apnea, unspecified: G47.30

## 2018-10-10 HISTORY — DX: Sick sinus syndrome: I49.5

## 2018-10-10 HISTORY — DX: Atherosclerotic heart disease of native coronary artery without angina pectoris: I25.10

## 2018-10-10 HISTORY — DX: Other cervical disc degeneration, unspecified cervical region: M50.30

## 2018-10-10 HISTORY — DX: Unspecified right bundle-branch block: I45.10

## 2018-10-10 LAB — CREATININE, SERUM
Creatinine, Ser: 0.69 mg/dL (ref 0.61–1.24)
GFR calc Af Amer: >60 mL/min (ref >60)
GFR calc non Af Amer: >60 mL/min (ref >60)

## 2018-10-10 LAB — BASIC METABOLIC PANEL
Anion gap: 6 (ref 5–15)
BUN: 15 mg/dL (ref 8–23)
CO2: 27 mmol/L (ref 22–32)
Calcium: 8.6 mg/dL — ABNORMAL LOW (ref 8.9–10.3)
Chloride: 108 mmol/L (ref 98–111)
Creatinine, Ser: 0.69 mg/dL (ref 0.61–1.24)
GFR calc Af Amer: >60 mL/min (ref >60)
GFR calc non Af Amer: >60 mL/min (ref >60)
Glucose, Bld: 146 mg/dL — ABNORMAL HIGH (ref 70–99)
Potassium: 3.4 mmol/L — ABNORMAL LOW (ref 3.5–5.1)
Sodium: 141 mmol/L (ref 135–145)

## 2018-10-10 LAB — CBC
HCT: 30 % — ABNORMAL LOW (ref 39.0–52.0)
Hemoglobin: 9 g/dL — ABNORMAL LOW (ref 13.0–17.0)
MCH: 26 pg (ref 26.0–34.0)
MCHC: 30 g/dL (ref 30.0–36.0)
MCV: 86.7 fL (ref 80.0–100.0)
Platelets: 379 10*3/uL (ref 150–400)
RBC: 3.46 MIL/uL — ABNORMAL LOW (ref 4.22–5.81)
RDW: 17.5 % — ABNORMAL HIGH (ref 11.5–15.5)
WBC: 15.8 10*3/uL — ABNORMAL HIGH (ref 4.0–10.5)
nRBC: 0 % (ref 0.0–0.2)

## 2018-10-10 LAB — VANCOMYCIN, RANDOM: Vancomycin Rm: 17

## 2018-10-10 LAB — TYPE AND SCREEN
ABO/RH(D): O POS
Antibody Screen: NEGATIVE

## 2018-10-10 LAB — GLUCOSE, CAPILLARY: Glucose-Capillary: 128 mg/dL — ABNORMAL HIGH (ref 70–99)

## 2018-10-10 LAB — BRAIN NATRIURETIC PEPTIDE: B Natriuretic Peptide: 500.2 pg/mL — ABNORMAL HIGH (ref 0.0–100.0)

## 2018-10-10 MED ORDER — ALBUTEROL SULFATE (2.5 MG/3ML) 0.083% IN NEBU: 3.0000 mL | INHALATION_SOLUTION | Freq: Four times a day (QID) | RESPIRATORY_TRACT | Status: DC | PRN

## 2018-10-10 MED ORDER — DIGOXIN 125 MCG PO TABS
0.1250 mg | ORAL_TABLET | Freq: Every day | ORAL | Status: DC
  Administered 2018-10-11 – 2018-10-14 (×2): 0.125 mg via ORAL
  Filled 2018-10-10 (×3): qty 1

## 2018-10-10 MED ORDER — HYDRALAZINE HCL 20 MG/ML IJ SOLN
5.0000 mg | INTRAMUSCULAR | Status: DC | PRN
  Filled 2018-10-10: qty 1

## 2018-10-10 MED ORDER — RISAQUAD PO CAPS
1.0000 | ORAL_CAPSULE | Freq: Every day | ORAL | Status: DC
  Administered 2018-10-11 – 2018-10-25 (×15): 1 via ORAL
  Filled 2018-10-10 (×15): qty 1

## 2018-10-10 MED ORDER — ACETAMINOPHEN 325 MG PO TABS
650.0000 mg | ORAL_TABLET | Freq: Four times a day (QID) | ORAL | Status: DC | PRN
  Administered 2018-10-11 – 2018-10-16 (×7): 650 mg via ORAL
  Filled 2018-10-10 (×7): qty 2

## 2018-10-10 MED ORDER — OXYCODONE-ACETAMINOPHEN 5-325 MG PO TABS
1.0000 | ORAL_TABLET | ORAL | Status: DC | PRN
  Administered 2018-10-10 – 2018-10-25 (×66): 1 via ORAL
  Filled 2018-10-10 (×66): qty 1

## 2018-10-10 MED ORDER — CYCLOBENZAPRINE HCL 10 MG PO TABS
10.0000 mg | ORAL_TABLET | Freq: Two times a day (BID) | ORAL | Status: DC | PRN
  Administered 2018-10-11 – 2018-10-21 (×9): 10 mg via ORAL
  Filled 2018-10-10 (×10): qty 1

## 2018-10-10 MED ORDER — ONDANSETRON HCL 4 MG/2ML IJ SOLN: 4.0000 mg | Freq: Three times a day (TID) | INTRAMUSCULAR | Status: DC | PRN

## 2018-10-10 MED ORDER — ATORVASTATIN CALCIUM 40 MG PO TABS
40.0000 mg | ORAL_TABLET | Freq: Every day | ORAL | Status: DC
  Administered 2018-10-11 – 2018-10-25 (×15): 40 mg via ORAL
  Filled 2018-10-10 (×15): qty 1

## 2018-10-10 MED ORDER — VANCOMYCIN HCL IN DEXTROSE 1-5 GM/200ML-% IV SOLN
1000.0000 mg | INTRAVENOUS | Status: DC
  Administered 2018-10-10 – 2018-10-18 (×9): 1000 mg via INTRAVENOUS
  Filled 2018-10-10 (×9): qty 200

## 2018-10-10 MED ORDER — INSULIN ASPART 100 UNIT/ML ~~LOC~~ SOLN
0.0000 [IU] | Freq: Three times a day (TID) | SUBCUTANEOUS | Status: DC
  Administered 2018-10-12: 1 [IU] via SUBCUTANEOUS
  Administered 2018-10-14 (×2): 2 [IU] via SUBCUTANEOUS
  Administered 2018-10-14 – 2018-10-15 (×2): 1 [IU] via SUBCUTANEOUS
  Administered 2018-10-15: 2 [IU] via SUBCUTANEOUS
  Administered 2018-10-16 (×2): 1 [IU] via SUBCUTANEOUS
  Administered 2018-10-17: 2 [IU] via SUBCUTANEOUS
  Administered 2018-10-17: 3 [IU] via SUBCUTANEOUS
  Administered 2018-10-18 – 2018-10-19 (×5): 2 [IU] via SUBCUTANEOUS
  Administered 2018-10-19 – 2018-10-20 (×3): 1 [IU] via SUBCUTANEOUS
  Administered 2018-10-21: 3 [IU] via SUBCUTANEOUS
  Administered 2018-10-21: 1 [IU] via SUBCUTANEOUS
  Administered 2018-10-21 – 2018-10-22 (×4): 2 [IU] via SUBCUTANEOUS
  Administered 2018-10-23 (×2): 3 [IU] via SUBCUTANEOUS
  Administered 2018-10-23: 5 [IU] via SUBCUTANEOUS
  Administered 2018-10-24: 3 [IU] via SUBCUTANEOUS
  Administered 2018-10-24: 2 [IU] via SUBCUTANEOUS
  Administered 2018-10-24: 5 [IU] via SUBCUTANEOUS
  Administered 2018-10-25: 2 [IU] via SUBCUTANEOUS

## 2018-10-10 MED ORDER — SODIUM CHLORIDE 0.9 % IV SOLN
INTRAVENOUS | Status: DC | PRN
  Administered 2018-10-10: 250 mL via INTRAVENOUS
  Administered 2018-10-16 – 2018-10-17 (×2): 500 mL via INTRAVENOUS

## 2018-10-10 MED ORDER — PANTOPRAZOLE SODIUM 40 MG PO TBEC
40.0000 mg | DELAYED_RELEASE_TABLET | Freq: Two times a day (BID) | ORAL | Status: DC
  Administered 2018-10-10 – 2018-10-25 (×30): 40 mg via ORAL
  Filled 2018-10-10 (×30): qty 1

## 2018-10-10 MED ORDER — NICOTINE 14 MG/24HR TD PT24
14.0000 mg | MEDICATED_PATCH | Freq: Every day | TRANSDERMAL | Status: DC
  Administered 2018-10-11 – 2018-10-25 (×15): 14 mg via TRANSDERMAL
  Filled 2018-10-10 (×15): qty 1

## 2018-10-10 MED ORDER — METOPROLOL SUCCINATE ER 100 MG PO TB24
100.0000 mg | ORAL_TABLET | Freq: Two times a day (BID) | ORAL | Status: DC
  Administered 2018-10-11 – 2018-10-14 (×7): 100 mg via ORAL
  Filled 2018-10-10 (×7): qty 1

## 2018-10-10 MED ORDER — INSULIN DETEMIR 100 UNIT/ML ~~LOC~~ SOLN
10.0000 [IU] | Freq: Every day | SUBCUTANEOUS | Status: DC
  Administered 2018-10-10 – 2018-10-11 (×2): 10 [IU] via SUBCUTANEOUS
  Filled 2018-10-10 (×3): qty 0.1

## 2018-10-10 NOTE — H&P (Addendum)
History and Physical    Henry Mcpherson AOZ:[id number] DOB: 02-25-1956 DOA: 10/10/2018  Referring MD/NP/PA:   PCP: System, Pcp Not In   Patient coming from:  The patient is coming from home.  At baseline, pt is independent for most of ADL.        Chief Complaint: back pain, bilateral leg weakness, foot infection, MRSA bacteremia,   HPI: Henry Mcpherson is a 63 y.o. male with medical history significant of sCHF with EF 45%, A. fib, tachybradycardia syndrome s/p PM, COPD, DM type II, PUD, lumbar stenosis, and cauda equina syndrome, s/p lumbar decompression and fusion by Dr. Saintclair Halsted on 3/4; s/p left great toe amputation due to infection, who is transferred from [facility] due to back pain, bilateral leg weakness, foot infection, MRSA bacteremia.   Pt was hospitalized to [facility] in the past 11 days due to diabetic toe infection.  Patient was seen by podiatry at the facility and status post resection.  Blood cultures at the facility were positive for MRSA bacteremia.  Patient was treated with 11 days of vancomycin and repeated cultures were noted to be negative. Currently pt does not have fever or chills.  He continues to have left foot pain, which is constant, sharp, 7 out of 10 in severity. Also reported to have been transfused 1 unit of packed red blood cells and stool guaiacs were noted to be positive.  Labs note WBC 13.2 (trending down) and hemoglobin 7.8 which is reportedly near his baseline.  Currently patient denies nausea, vomiting, diarrhea or abdominal pain.  Patient states that he had loose stool bowel movement recently, which has resolved completely.  Denies symptoms of UTI.  No facial droop or slurred speech.  Pt states that he underwent lumbar decompression and fusion surgery by Dr. Saintclair Halsted 3/4 due to cauda equina syndrome. He states that he continues to have back pain, bilateral leg weakness, difficulty walking.  He states that he has decreased sensation in  both legs which he attributes to diabetic neuropathy.  Patient does not have incontinence of urine or bowel movement, but feels like he does not have good control of bowel movement. He states that he had lumbar X-ray 3 weeks ago, and finding was concerning for possible infection as per Dr. Windy Carina review (per pt's report).  Pt was found to have negative COVID-19 test, WBC 13.2, hemoglobin 7.8 which was 8.4 on 07/13/2018, electrolytes renal function okay.  The repeated CBC showed WBC 15.8, hemoglobin 9.0.  Currently temperature normal, blood pressure 134/66, heart rate 47, respiration rate 18, oxygen saturation 96% on room air.  Patient is admitted to telemetry bed as inpatient.  Neurosurgeon, Dr. Ellene Route was consulted, they will see pt in AM.  Review of Systems:   General: no fevers, chills, no body weight gain, has fatigue HEENT: no blurry vision, hearing changes or sore throat Respiratory: no dyspnea, coughing, wheezing CV: no chest pain, no palpitations GI: no nausea, vomiting, abdominal pain, diarrhea, constipation GU: no dysuria, burning on urination, increased urinary frequency, hematuria  Ext: has leg edema. S/p of amputation of left great toe with an open wound, no active draining. Neuro: No vision change or hearing loss. Has bilateral leg weakness. Skin: no rash. MSK: has lower back pain Heme: No easy bruising.  Travel history: No recent long distant travel.  Allergy: No Known Allergies  Past Medical History:  Diagnosis Date   CHF (congestive heart failure) (HCC)    COPD (chronic obstructive pulmonary disease) ([place])  Diabetes mellitus without complication ([place])    Hypertension     Past Surgical History:  Procedure Laterality Date   BACK SURGERY     CARDIAC SURGERY      Social History:  reports that he has quit smoking. He has never used smokeless tobacco. He reports previous alcohol use. He reports previous drug use.  Family History:  Pt is adopted  Prior to  Admission medications   Medication Sig Start Date End Date Taking? Authorizing Provider  acetaminophen (TYLENOL) 325 MG tablet Take 2 tablets (650 mg total) by mouth every 4 (four) hours as needed for mild pain ((score 1 to 3) or temp > 100.5). 07/12/18   Masoudi, Elhamalsadat, MD  albuterol (PROVENTIL HFA;VENTOLIN HFA) 108 (90 Base) MCG/ACT inhaler Inhale 1-2 puffs into the lungs every 6 (six) hours as needed for wheezing or shortness of breath.    [provider]  atorvastatin (LIPITOR) 40 MG tablet Take 40 mg by mouth daily.    [provider]  cyclobenzaprine (FLEXERIL) 10 MG tablet Take 1 tablet (10 mg total) by mouth 2 (two) times daily as needed for muscle spasms. 07/12/18   Masoudi, Dorthula Rue, MD  digoxin (LANOXIN) 0.125 MG tablet Take 0.125 mg by mouth daily. 05/04/18   [provider]  furosemide (LASIX) 40 MG tablet Take 40 mg by mouth daily.    [provider]  metoprolol succinate (TOPROL-XL) 100 MG 24 hr tablet Take 100 mg by mouth 2 (two) times daily. 06/16/18   [provider]  nicotine (NICODERM CQ - DOSED IN MG/24 HOURS) 14 mg/24hr patch Place 1 patch (14 mg total) onto the skin daily. 07/13/18   Masoudi, Elhamalsadat, MD  pantoprazole (PROTONIX) 40 MG tablet Take 40 mg by mouth 2 (two) times daily. 05/26/18   [provider]  Probiotic Product (PROBIOTIC DAILY PO) Take 1 capsule by mouth daily.    [provider]  senna-docusate (SENOKOT-S) 8.6-50 MG tablet Take 1 tablet by mouth 2 (two) times daily as needed for mild constipation. 07/12/18   Dewayne Hatch, MD    Physical Exam: Vitals:   10/10/18 1902 10/10/18 2000 10/10/18 2157  BP: 134/66  (!) 115/56  Pulse: (!) 47  (!) 51  Resp: 18  16  Temp: 98 F (36.7 C)  98.4 F (36.9 C)  TempSrc: Oral  Oral  SpO2: 96%  100%  Weight:  86.4 kg   Height:  6' 2"  (1.88 m)    General: Not in acute distress HEENT:       Eyes: PERRL, EOMI, no scleral icterus.        ENT: No discharge from the ears and nose, no pharynx injection, no tonsillar enlargement.        Neck: No JVD, no bruit, no mass felt. Heme: No neck lymph node enlargement. Cardiac: S1/S2, RRR, No murmurs, No gallops or rubs. Respiratory: No rales, wheezing, rhonchi or rubs. GI: Soft, nondistended, nontender, no rebound pain, no organomegaly, BS present. GU: No hematuria Ext: 2+ pitting leg edema bilaterally. 1+DP/PT pulse bilaterally. S/p of left great toe amputation, with an open wound, no active draining. Musculoskeletal: No joint deformities, No joint redness or warmth, no limitation of ROM in spin. Skin: No rashes.  Neuro: Alert, oriented X3, cranial nerves II-XII grossly intact, moves all extremities. Has bilateral leg weakness, muscle strength 1/5 in both legs. Psych: Patient is not psychotic, no suicidal or hemocidal ideation.  Labs on Admission: I have personally reviewed following labs and imaging studies  CBC: Recent Labs  Lab 10/10/18 2004  WBC 15.8*  HGB 9.0*  HCT 30.0*  MCV 86.7  PLT 025   Basic Metabolic Panel: Recent Labs  Lab 10/10/18 2004 10/10/18 2028  NA 141  --   K 3.4*  --   CL 108  --   CO2 27  --   GLUCOSE 146*  --   BUN 15  --   CREATININE 0.69 0.69  CALCIUM 8.6*  --    GFR: Estimated Creatinine Clearance: 107 mL/min (by C-G formula based on SCr of 0.69 mg/dL). Liver Function Tests: No results for input(s): AST, ALT, ALKPHOS, BILITOT, PROT, ALBUMIN in the last 168 hours. No results for input(s): LIPASE, AMYLASE in the last 168 hours. No results for input(s): AMMONIA in the last 168 hours. Coagulation Profile: No results for input(s): INR, PROTIME in the last 168 hours. Cardiac Enzymes: No results for input(s): CKTOTAL, CKMB, CKMBINDEX, TROPONINI in the last 168 hours. BNP (last 3 results) No results for input(s): PROBNP in the last 8760 hours. HbA1C: No results for input(s): HGBA1C in the last 72 hours. CBG: Recent Labs  Lab  10/10/18 2003  GLUCAP 128*   Lipid Profile: No results for input(s): CHOL, HDL, LDLCALC, TRIG, CHOLHDL, LDLDIRECT in the last 72 hours. Thyroid Function Tests: No results for input(s): TSH, T4TOTAL, FREET4, T3FREE, THYROIDAB in the last 72 hours. Anemia Panel: No results for input(s): VITAMINB12, FOLATE, FERRITIN, TIBC, IRON, RETICCTPCT in the last 72 hours. Urine analysis:    Component Value Date/Time   COLORURINE YELLOW 07/08/2018 0224   APPEARANCEUR CLEAR 07/08/2018 0224   LABSPEC 1.011 07/08/2018 0224   PHURINE 6.0 07/08/2018 0224   GLUCOSEU NEGATIVE 07/08/2018 0224   HGBUR SMALL (A) 07/08/2018 0224   BILIRUBINUR NEGATIVE 07/08/2018 0224   KETONESUR NEGATIVE 07/08/2018 0224   PROTEINUR NEGATIVE 07/08/2018 0224   NITRITE NEGATIVE 07/08/2018 0224   LEUKOCYTESUR SMALL (A) 07/08/2018 0224   Sepsis Labs: @LABRCNTIP (procalcitonin:4,lacticidven:4) ) Recent Results (from the past 240 hour(s))  Surgical pcr screen     Status: None   Collection Time: 10/11/18 12:21 AM  Result Value Ref Range Status   MRSA, PCR NEGATIVE NEGATIVE Final   Staphylococcus aureus NEGATIVE NEGATIVE Final    Comment: (NOTE) The Xpert SA Assay (FDA approved for NASAL specimens in patients 59 years of age and older), is one component of a comprehensive surveillance program. It is not intended to diagnose infection nor to guide or monitor treatment. Performed at [facility], [place] [address], [place], [place] [address]   SARS Coronavirus 2     Status: None   Collection Time: 10/11/18 12:23 AM  Result Value Ref Range Status   SARS Coronavirus 2 NOT DETECTED NOT DETECTED Final    Comment: (NOTE) SARS-CoV-2 target nucleic acids are NOT DETECTED. The SARS-CoV-2 RNA is generally detectable in upper and lower respiratory specimens during the acute phase of infection.  Negative  results do not preclude SARS-CoV-2 infection, do not rule out co-infections with other pathogens, and should not be used as  the sole basis for treatment or other patient management decisions.  Negative results must be combined with clinical observations, patient history, and epidemiological information. The expected result is Not Detected. Fact Sheet for Patients: http://www.biofiredefense.com/wp-content/uploads/2020/03/BIOFIRE-COVID -19-patients.pdf Fact Sheet for Healthcare Providers: http://www.biofiredefense.com/wp-content/uploads/2020/03/BIOFIRE-COVID -19-hcp.pdf This test is not yet approved or cleared by the [place] and  has been authorized for detection and/or diagnosis of SARS-CoV-2 by FDA under an Emergency Use Authorization (EUA).  This EUA will remain in effec t (meaning this test can be used) for the duration of  the COVID-19 declaration under Section 564(b)(1) of the Act, 21 U.S.C. section 360bbb-3(b)(1), unless the authorization is terminated or revoked sooner. Performed at [facility], [place] [address] [place], [place] [address]      Radiological Exams on Admission: No results found.   EKG: Reviewed independently.  Undetermined rhythm, PVC, right bundle blockade, early R wave progression  Assessment/Plan Principal Problem:   MRSA bacteremia Active Problems:   Chronic obstructive lung disease (HCC)   Cauda equina syndrome (HCC)   Spinal stenosis of lumbar region   HTN (hypertension)   HLD (hyperlipidemia)   GERD (gastroesophageal reflux disease)   Chronic systolic CHF (congestive heart failure) (HCC)   Atrial fibrillation, chronic   CAD (coronary artery disease)   History of amputation of left great toe (HCC)   Anemia   Stool guaiac positive   Left foot infection-great toe   Diabetes mellitus type 2 with complications (HCC)  MRSA bacteremia: pt has two possible source of infection, including left great toe infection and possible lumbar discitis. Patient was treated with 11 days of vancomycin and repeated cultures were noted to be negative. Currently pt does  not have fever or chills, but has cytosis with WBC 13.2 in RH-->15.8 here.  -will admit to tele bed as inpt. -continue IV Vancomycin -repeat Bx  -get 2d echo. -check ESR and CRP  Cauda equina syndrome and spinal stenosis of lumbar region: s/p of lumbar decompression and fusion by Dr. Saintclair Halsted on 3/4. Recent image findings are concerning for possible infection discitis. Pt has bilateral leg weakness.  Neurosurgeon, Dr. Ellene Route was consulted, they will see patient in morning -on Vancomycin - will get Ct-L spin w/ and w/o constrast -Follow-up neurosurgeon's recommendations  Left foot infection-great toe: s/p of amputation.  -Continue vancomycin as above -Wound care consult  Chronic obstructive lung disease ([place]): stable. -prn albuterol nebs  Controlled type 2 diabetes mellitus with complications, diabetic foot infection:  No A1c on record. Pt is taking NovoLog and Levemir at home. -Decrease Levemir dose from 20 to10 units daily -SSI  Essential hypertension: -IV Hydralazine prn -Continue home medications: Metoprolol -Prednisone Lasix  HLD (hyperlipidemia): -lipitor  GERD (gastroesophageal reflux disease): -prototonix  Chronic systolic CHF (congestive heart failure) ([place]): 2D echo on 07/05/2018 showed EF 45-50%.  Patient does not have shortness of breath, but he has 2+ bilateral lower leg edema.  She does not have acute CHF exacerbation, but is at risk of developing exacerbation. -Continue home oral Lasix 40 mg daily -Give extra dose of Lasix by IV, 20 mg x 1  Atrial Fibrillation: CHA2DS2-VASc Score is 5, needs oral anticoagulation, but pt is not on AC. Not sure why he is not AC. Now he has possible GIB with stool guaiac positive.  -continue metoprolol and digoxin  CAD (coronary artery disease): s/p of stent and CABG. No Cp. -continue lipitor and metoprolol  Anemia and positive guaiac stool test: Hemoglobin 8.4 on 07/13/2018.  He had hemoglobin 7.8 in RH, repeated CBC showed  hemoglobin 9.0 today. -f/u by CBC in AM -protonix 40 bid   Inpatient status:  # Patient requires inpatient status due to high intensity of service, high risk for further deterioration and high frequency of surveillance required.  I certify that at the point of admission it is my clinical judgment that the patient will require inpatient hospital care spanning beyond 2 midnights from the point of  admission.   This patient has multiple chronic comorbidities including  sCHF with EF 45%, A. fib, tachybradycardia syndrome s/p PM, COPD, DM type II, PUD, lumbar stenosis, and cauda equina syndrome, s/p lumbar decompression and fusion by Dr. 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 Per neurosurgeon, patient may have lumbar discitis.  The worrisome physical exam findings include left foot wound, 2+ bilateral leg edema, bilateral leg weakness  The initial radiographic and laboratory data are worrisome because of leukocytosis, positive FOBT  Current medical needs: please see my assessment and plan  Predictability of an adverse outcome (risk): Patient has multiple comorbidities, now presents with multiple complicated issues, including back pain, bilateral leg weakness, left foot infection, MRSA bacteremia. Per neurosurgeon, patient may have lumbar discitis. His presentation is highly complicated.  Will need to be treated in hospital for at least 2 days.   DVT ppx: SCD Code Status: Full code Family Communication: None at bed side.   Disposition Plan:  Anticipate discharge back to previous home environment Consults called:  Dr. Ellene Route of neurosurgeon Admission status:  Inpatient/tele   Date of Service 10/11/2018    [facility]   If 7PM-7AM, please contact night-coverage [email or web address] Password [email or web address] 10/11/2018, 5:03 AM

## 2018-10-10 NOTE — Progress Notes (Addendum)
Pharmacy Antibiotic Note  Henry Mcpherson is a 63 y.o. male transferred OSH to [facility] on 10/10/2018. 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 Pharmacy has been consulted to resume Vancomycin dosing.  The patient was on a dose of 1g IV every 24 hours. Per chart review - last SCr was 0.9 (6/7) with a VT of 19.4 mcg/ml on 6/7 @ 2033. The patient's last Vancomycin dose is unknown - though presumably would be on 6/8 evening. Will obtain a Vancomycin random level and re-dose as appropriate pending results.    Plan: - Vancomycin random and SCr now - Will address dosing once levels have resulted  Addendum: Vancomycin random level = 17 - will continue vancomycin 1g IV q 24 hrs.  Next dose now.  Marguerite Olea, BCCP Clinical Pharmacist Phone [phone]  10/10/2018 9:31 PM       Temp (24hrs), Avg:98 F (36.7 C), Min:98 F (36.7 C), Max:98 F (36.7 C)  No results for input(s): WBC, CREATININE, LATICACIDVEN, VANCOTROUGH, VANCOPEAK, VANCORANDOM, GENTTROUGH, GENTPEAK, GENTRANDOM, TOBRATROUGH, TOBRAPEAK, TOBRARND, AMIKACINPEAK, AMIKACINTROU, AMIKACIN in the last 168 hours.  CrCl cannot be calculated (Patient's most recent lab result is older than the maximum 21 days allowed.).    No Known Allergies  Antimicrobials this admission: Vanc OSH (started 5/30?) >>  Dose adjustments this admission:   Microbiology results: OSH BCx >> 2/2 MRSA 6/9 BCx >>  Thank you for allowing pharmacy to be a part of this patient's care.  Alycia Rossetti, PharmD, BCPS Clinical Pharmacist Clinical phone for 10/10/2018: [phone] 10/10/2018 8:29 PM   **Pharmacist phone directory can now be found on [email or web address] (PW [email or web address]).  Listed under [place].

## 2018-10-11 ENCOUNTER — Inpatient Hospital Stay (HOSPITAL_COMMUNITY): Payer: Medicare Other

## 2018-10-11 ENCOUNTER — Other Ambulatory Visit: Payer: Self-pay

## 2018-10-11 DIAGNOSIS — Z959 Presence of cardiac and vascular implant and graft, unspecified: Secondary | ICD-10-CM

## 2018-10-11 DIAGNOSIS — I2510 Atherosclerotic heart disease of native coronary artery without angina pectoris: Secondary | ICD-10-CM

## 2018-10-11 DIAGNOSIS — Z8619 Personal history of other infectious and parasitic diseases: Secondary | ICD-10-CM

## 2018-10-11 DIAGNOSIS — M4645 Discitis, unspecified, thoracolumbar region: Secondary | ICD-10-CM

## 2018-10-11 DIAGNOSIS — M4646 Discitis, unspecified, lumbar region: Secondary | ICD-10-CM

## 2018-10-11 DIAGNOSIS — I340 Nonrheumatic mitral (valve) insufficiency: Secondary | ICD-10-CM

## 2018-10-11 DIAGNOSIS — L089 Local infection of the skin and subcutaneous tissue, unspecified: Secondary | ICD-10-CM | POA: Diagnosis present

## 2018-10-11 DIAGNOSIS — Z87891 Personal history of nicotine dependence: Secondary | ICD-10-CM

## 2018-10-11 DIAGNOSIS — R195 Other fecal abnormalities: Secondary | ICD-10-CM

## 2018-10-11 DIAGNOSIS — E118 Type 2 diabetes mellitus with unspecified complications: Secondary | ICD-10-CM | POA: Diagnosis present

## 2018-10-11 DIAGNOSIS — D500 Iron deficiency anemia secondary to blood loss (chronic): Secondary | ICD-10-CM

## 2018-10-11 DIAGNOSIS — L8990 Pressure ulcer of unspecified site, unspecified stage: Secondary | ICD-10-CM

## 2018-10-11 LAB — PROTIME-INR
INR: 1.2 (ref 0.8–1.2)
Prothrombin Time: 14.9 seconds (ref 11.4–15.2)

## 2018-10-11 LAB — SARS CORONAVIRUS 2: SARS Coronavirus 2: NOT DETECTED

## 2018-10-11 LAB — ECHOCARDIOGRAM COMPLETE
Height: 74 in
Weight: 3047.64 oz

## 2018-10-11 LAB — GLUCOSE, CAPILLARY
Glucose-Capillary: 95 mg/dL (ref 70–99)
Glucose-Capillary: 75 mg/dL (ref 70–99)
Glucose-Capillary: 84 mg/dL (ref 70–99)
Glucose-Capillary: 65 mg/dL — ABNORMAL LOW (ref 70–99)

## 2018-10-11 LAB — SURGICAL PCR SCREEN
MRSA, PCR: NEGATIVE
Staphylococcus aureus: NEGATIVE

## 2018-10-11 LAB — BASIC METABOLIC PANEL
Anion gap: 8 (ref 5–15)
BUN: 15 mg/dL (ref 8–23)
CO2: 27 mmol/L (ref 22–32)
Calcium: 8.5 mg/dL — ABNORMAL LOW (ref 8.9–10.3)
Chloride: 105 mmol/L (ref 98–111)
Creatinine, Ser: 0.63 mg/dL (ref 0.61–1.24)
GFR calc Af Amer: >60 mL/min (ref >60)
GFR calc non Af Amer: >60 mL/min (ref >60)
Glucose, Bld: 128 mg/dL — ABNORMAL HIGH (ref 70–99)
Potassium: 3.2 mmol/L — ABNORMAL LOW (ref 3.5–5.1)
Sodium: 140 mmol/L (ref 135–145)

## 2018-10-11 LAB — CBC
HCT: 27.8 % — ABNORMAL LOW (ref 39.0–52.0)
Hemoglobin: 8.4 g/dL — ABNORMAL LOW (ref 13.0–17.0)
MCH: 26.3 pg (ref 26.0–34.0)
MCHC: 30.2 g/dL (ref 30.0–36.0)
MCV: 86.9 fL (ref 80.0–100.0)
Platelets: 342 10*3/uL (ref 150–400)
RBC: 3.2 MIL/uL — ABNORMAL LOW (ref 4.22–5.81)
RDW: 17.4 % — ABNORMAL HIGH (ref 11.5–15.5)
WBC: 11.2 10*3/uL — ABNORMAL HIGH (ref 4.0–10.5)
nRBC: 0 % (ref 0.0–0.2)

## 2018-10-11 LAB — SEDIMENTATION RATE: Sed Rate: 50 mm/hr — ABNORMAL HIGH (ref 0–16)

## 2018-10-11 LAB — C-REACTIVE PROTEIN: CRP: 2.2 mg/dL — ABNORMAL HIGH (ref <1.0)

## 2018-10-11 LAB — APTT: aPTT: 35 seconds (ref 24–36)

## 2018-10-11 MED ORDER — GENERIC EXTERNAL MEDICATION
Status: DC
Start: ? — End: 2018-10-11

## 2018-10-11 MED ORDER — ALBUTEROL SULFATE HFA 108 (90 BASE) MCG/ACT IN AERS
2.00 | INHALATION_SPRAY | RESPIRATORY_TRACT | Status: DC
Start: ? — End: 2018-10-11

## 2018-10-11 MED ORDER — DOCUSATE SODIUM 100 MG PO CAPS
100.00 | ORAL_CAPSULE | ORAL | Status: DC
Start: 2018-10-10 — End: 2018-10-11

## 2018-10-11 MED ORDER — CYCLOBENZAPRINE HCL 10 MG PO TABS
10.00 | ORAL_TABLET | ORAL | Status: DC
Start: ? — End: 2018-10-11

## 2018-10-11 MED ORDER — TRAZODONE HCL 50 MG PO TABS
50.00 | ORAL_TABLET | ORAL | Status: DC
Start: 2018-10-10 — End: 2018-10-11

## 2018-10-11 MED ORDER — MAGNESIUM HYDROXIDE 400 MG/5ML PO SUSP
30.00 | ORAL | Status: DC
Start: ? — End: 2018-10-11

## 2018-10-11 MED ORDER — VITAMIN B-12 1000 MCG PO TABS
1000.00 | ORAL_TABLET | ORAL | Status: DC
Start: 2018-10-11 — End: 2018-10-11

## 2018-10-11 MED ORDER — FUROSEMIDE 10 MG/ML IJ SOLN
20.0000 mg | Freq: Once | INTRAMUSCULAR | Status: AC
  Administered 2018-10-11: 20 mg via INTRAVENOUS
  Filled 2018-10-11: qty 2

## 2018-10-11 MED ORDER — INSULIN LISPRO 100 UNIT/ML ~~LOC~~ SOLN
2.00 | SUBCUTANEOUS | Status: DC
Start: 2018-10-10 — End: 2018-10-11

## 2018-10-11 MED ORDER — IOHEXOL 300 MG/ML  SOLN
75.0000 mL | Freq: Once | INTRAMUSCULAR | Status: AC | PRN
  Administered 2018-10-11: 75 mL via INTRAVENOUS

## 2018-10-11 MED ORDER — FUROSEMIDE 40 MG PO TABS
40.0000 mg | ORAL_TABLET | Freq: Every day | ORAL | Status: DC
  Administered 2018-10-11 – 2018-10-16 (×6): 40 mg via ORAL
  Filled 2018-10-11 (×6): qty 1

## 2018-10-11 MED ORDER — GENERIC EXTERNAL MEDICATION
1000.00 | Status: DC
Start: 2018-10-11 — End: 2018-10-11

## 2018-10-11 MED ORDER — LOPERAMIDE HCL 2 MG PO CAPS
4.00 | ORAL_CAPSULE | ORAL | Status: DC
Start: ? — End: 2018-10-11

## 2018-10-11 MED ORDER — METOPROLOL SUCCINATE ER 50 MG PO TB24
50.00 | ORAL_TABLET | ORAL | Status: DC
Start: 2018-10-11 — End: 2018-10-11

## 2018-10-11 MED ORDER — ACETAMINOPHEN 325 MG PO TABS
650.00 | ORAL_TABLET | ORAL | Status: DC
Start: ? — End: 2018-10-11

## 2018-10-11 MED ORDER — POTASSIUM CHLORIDE CRYS ER 20 MEQ PO TBCR
40.0000 meq | EXTENDED_RELEASE_TABLET | ORAL | Status: AC
  Administered 2018-10-11 (×2): 40 meq via ORAL
  Filled 2018-10-11 (×2): qty 2

## 2018-10-11 MED ORDER — TRAMADOL HCL 50 MG PO TABS
50.00 | ORAL_TABLET | ORAL | Status: DC
Start: ? — End: 2018-10-11

## 2018-10-11 MED ORDER — SUCRALFATE 1 G PO TABS
1.00 | ORAL_TABLET | ORAL | Status: DC
Start: 2018-10-10 — End: 2018-10-11

## 2018-10-11 MED ORDER — INSULIN DETEMIR 100 UNIT/ML ~~LOC~~ SOLN
2.00 | SUBCUTANEOUS | Status: DC
Start: ? — End: 2018-10-11

## 2018-10-11 MED ORDER — FUROSEMIDE 40 MG PO TABS
40.00 | ORAL_TABLET | ORAL | Status: DC
Start: 2018-10-11 — End: 2018-10-11

## 2018-10-11 MED ORDER — OXYCODONE-ACETAMINOPHEN 5-325 MG PO TABS
1.00 | ORAL_TABLET | ORAL | Status: DC
Start: ? — End: 2018-10-11

## 2018-10-11 MED ORDER — VANCOMYCIN HCL IN DEXTROSE 1-5 GM/200ML-% IV SOLN
1000.00 | INTRAVENOUS | Status: DC
Start: 2018-10-10 — End: 2018-10-11

## 2018-10-11 MED ORDER — OMEPRAZOLE 20 MG PO CPDR
40.00 | DELAYED_RELEASE_CAPSULE | ORAL | Status: DC
Start: 2018-10-11 — End: 2018-10-11

## 2018-10-11 MED ORDER — GENERIC EXTERNAL MEDICATION
8.60 | Status: DC
Start: ? — End: 2018-10-11

## 2018-10-11 MED ORDER — ATORVASTATIN CALCIUM 20 MG PO TABS
40.00 | ORAL_TABLET | ORAL | Status: DC
Start: 2018-10-10 — End: 2018-10-11

## 2018-10-11 MED ORDER — NICOTINE 21 MG/24HR TD PT24
1.00 | MEDICATED_PATCH | TRANSDERMAL | Status: DC
Start: 2018-10-10 — End: 2018-10-11

## 2018-10-11 MED ORDER — SIMETHICONE 80 MG PO CHEW
80.00 | CHEWABLE_TABLET | ORAL | Status: DC
Start: ? — End: 2018-10-11

## 2018-10-11 MED ORDER — SACCHAROMYCES BOULARDII 250 MG PO CAPS
250.00 | ORAL_CAPSULE | ORAL | Status: DC
Start: 2018-10-10 — End: 2018-10-11

## 2018-10-11 MED ORDER — DIGOXIN 125 MCG PO TABS
.13 | ORAL_TABLET | ORAL | Status: DC
Start: 2018-10-11 — End: 2018-10-11

## 2018-10-11 MED ORDER — RIFAMPIN 300 MG PO CAPS
300.0000 mg | ORAL_CAPSULE | Freq: Three times a day (TID) | ORAL | Status: DC
  Administered 2018-10-11 – 2018-10-16 (×16): 300 mg via ORAL
  Filled 2018-10-11 (×21): qty 1

## 2018-10-11 NOTE — Progress Notes (Signed)
  Echocardiogram 2D Echocardiogram has been performed.  Henry Mcpherson 10/11/2018, 10:58 AM

## 2018-10-11 NOTE — Progress Notes (Signed)
Aware of IR request for possible image-guided T12-L1 disc aspiration.  Procedure has been approved by Dr. Estanislado Pandy and is scheduled tentatively for 10/12/2018. Patient will be NPO at midnight. INR 1.2 today. COVID negative today. Will consent patient in IR tomorrow prior to procedure. Angela Nevin, RN aware.  Please call IR with questions/concerns.   Bea Graff Kahleel Fadeley, PA-C 10/11/2018, 4:17 PM

## 2018-10-11 NOTE — Consult Note (Signed)
[facility]    Date of Admission:  10/10/2018     Total days of antibiotics 13  Vancomycin                Reason for Consult: MRSA Bacteremia     Referring Provider: Auto-consult  Primary Care Provider: System, Pcp Not In   Assessment: Henry Mcpherson is a very nice 63 y.o. male recently transferred from [facility] for neurosurgery consultation with findings on CT scan of the spine suggesting discitis/osteomyelitis extending beyond previous fusion. Initial presenting complaint requiring hospitalization included infection of the left great toe - he was found to have associated MRSA bacteremia and started on IV vancomycin for this and recommended amputation of the affected toe. He has done well healing from surgery thusfar.   He has loosening of the left L3 screw with further endplate destruction of T12-L1 concerning for discitis with regional osteomyelitis. Uncertain if PPM is MRI compatible - CT guided biopsy of the vertebral bone recommended by neurosurgery to assess for infectious changes on pathology to help with further surgical planning. At this point likely very low yield of bacterial growth given he has been on abx for 13 days and unlikely to be alternative organism given he was bacteremic at presentation.   To further complicate he also has a pacemaker that he is dependent on. He will require TEE to evaluate the leads and cardiac valves for vegetation to determine if the device will require removal. With the PPM and hardware will add rifampin therapy TID for synergy.   His blood cultures have cleared and from infectious stand point ready for PICC line; however considering he may require PPM extraction may be best to place midline in the short term.   Welcomed and answered all questions. I offered to call his wife and he will let me know if I need to give her an update.   Plan: 1. Please consult cardiology / EP for evaluation of  his device with TEE 2. Add rifampin 300 mg TID  3. Continue vancomycin with close monitoring of kidneys 4. Would hold on PICC until #1 - could do midline alternatively for short term if primary team agrees.  5. 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    Chronic obstructive lung disease (HCC)   Cauda equina syndrome (HCC)   Spinal stenosis of lumbar region   HTN (hypertension)   HLD (hyperlipidemia)   GERD (gastroesophageal reflux disease)   Chronic systolic CHF (congestive heart failure) (HCC)   Atrial fibrillation, chronic   CAD (coronary artery disease)   Anemia   Stool guaiac positive   Diabetes mellitus type 2 with complications (HCC)   Pressure injury of skin   Cardiac device in situ    acidophilus  1 capsule Oral Daily   atorvastatin  40 mg Oral Daily   digoxin  0.125 mg Oral Daily   furosemide  40 mg Oral Daily   insulin aspart  0-9 Units Subcutaneous TID WC   insulin detemir  10 Units Subcutaneous Daily   metoprolol succinate  100 mg Oral BID   nicotine  14 mg Transdermal Daily   pantoprazole  40 mg Oral BID    HPI: Henry Mcpherson  is a 63 y.o. male transferred to [facility] for ongoing treatment of MRSA bacteremia and neurosurgical consultation for discitis. He is S/P amputation of the L great toe from his podiatrists that he had been attempting to treat for several weeks (hospitalized at [facility]).   He is feeling much better and tells me that he met Dr. Windy Carina associate today who recommended a sampling of his vertebra to further assess if there is infectious changes to the spine. He has been on vancomycin for 13 days now and tolerating this well. From [facility]  records he cleared his bacteremia on 6/1 (inially positive  drawn on 5/29 in both aerobic bottles from draw sites).   He underwent a lumbar decompression for cauda equina syndrome 3 months ago with Dr. Saintclair Halsted and since then has not had much relief with regards to his pain . He had no trouble with any post operative wound infection or delay in healing from what he reports. He reports that his back pain has had some slight improvement since starting antibiotics but still difficult to live with.   Of note he has a PPM in place in left chest. He had this initially placed +20 years ago with generator exchange around 3 years ago. He denies any pain or tenderness at the pocket.   He also reports that he has had a history of Cdiff infections 2 times in the past with associated antibiotic use. Last episode was 2 years ago by his estimate.   Review of Systems: Review of Systems  Constitutional: Negative for fever, malaise/fatigue and weight loss.  Respiratory: Negative for cough and sputum production.   Cardiovascular: Positive for leg swelling. Negative for chest pain and orthopnea.  Genitourinary: Negative for dysuria.  Musculoskeletal: Positive for back pain. Negative for joint pain and neck pain.  Skin: Negative for itching and rash.  Neurological: Negative for dizziness.  Psychiatric/Behavioral: Negative for depression.    Past Medical History:  Diagnosis Date   CHF (congestive heart failure) (HCC)    COPD (chronic obstructive pulmonary disease) (HCC)    Diabetes mellitus without complication ([place])    Hypertension     Social History   Tobacco Use   Smoking status: Former Smoker   Smokeless tobacco: Never Used  Substance Use Topics   Alcohol use: Not Currently   Drug use: Not Currently    No family history on file. Allergies  Allergen Reactions   Warfarin And Related     Itching/rash     OBJECTIVE: Blood pressure (!) 91/56, pulse (!) 47, temperature 97.7 F (36.5 C), temperature source Oral, resp. rate 18, height 6' 2"  (1.88  m), weight 86.4 kg, SpO2 99 %.  Physical Exam Vitals signs and nursing note reviewed.  Constitutional:      Comments: Resting comfortably in bed. No distress.   HENT:     Mouth/Throat:     Mouth: Mucous membranes are dry.     Pharynx: Oropharynx is clear.  Eyes:     Conjunctiva/sclera: Conjunctivae normal.     Pupils: Pupils are equal, round, and reactive to light.  Cardiovascular:     Rate and Rhythm: Bradycardia present. Rhythm irregular.     Heart sounds: Normal heart sounds. No murmur.  Pulmonary:     Effort: Pulmonary effort is normal. No respiratory distress.     Breath sounds: Normal breath sounds.  Abdominal:     General: Abdomen is flat. Bowel sounds are normal. There is no distension.  Musculoskeletal:  Right lower leg: Edema present.     Left lower leg: Edema present.     Comments: Left great toe with clean dry bandage in place. Allevyn foam dressings to heels.  He has pitting lower extremity edema and chronic skin changes c/w venous insufficiency   Skin:    General: Skin is warm.     Comments: L Chest PPM pocket is unremarkable and non tender  Neurological:     Mental Status: He is oriented to person, place, and time.     Lab Results Lab Results  Component Value Date   WBC 11.2 (H) 10/11/2018   HGB 8.4 (L) 10/11/2018   HCT 27.8 (L) 10/11/2018   MCV 86.9 10/11/2018   PLT 342 10/11/2018    Lab Results  Component Value Date   CREATININE 0.63 10/11/2018   BUN 15 10/11/2018   NA 140 10/11/2018   K 3.2 (L) 10/11/2018   CL 105 10/11/2018   CO2 27 10/11/2018    Lab Results  Component Value Date   ALT 50 (H) 07/13/2018   AST 31 07/13/2018   ALKPHOS 91 07/13/2018   BILITOT 0.5 07/13/2018     Microbiology: Recent Results (from the past 240 hour(s))  Surgical pcr screen     Status: None   Collection Time: 10/11/18 12:21 AM  Result Value Ref Range Status   MRSA, PCR NEGATIVE NEGATIVE Final   Staphylococcus aureus NEGATIVE NEGATIVE Final    Comment:  (NOTE) The Xpert SA Assay (FDA approved for NASAL specimens in patients 60 years of age and older), is one component of a comprehensive surveillance program. It is not intended to diagnose infection nor to guide or monitor treatment. Performed at [facility], [place] [address], [place], [place] [address]   SARS Coronavirus 2     Status: None   Collection Time: 10/11/18 12:23 AM  Result Value Ref Range Status   SARS Coronavirus 2 NOT DETECTED NOT DETECTED Final    Comment: (NOTE) SARS-CoV-2 target nucleic acids are NOT DETECTED. The SARS-CoV-2 RNA is generally detectable in upper and lower respiratory specimens during the acute phase of infection.  Negative  results do not preclude SARS-CoV-2 infection, do not rule out co-infections with other pathogens, and should not be used as the sole basis for treatment or other patient management decisions.  Negative results must be combined with clinical observations, patient history, and epidemiological information. The expected result is Not Detected. Fact Sheet for Patients: http://www.biofiredefense.com/wp-content/uploads/2020/03/BIOFIRE-COVID -19-patients.pdf Fact Sheet for Healthcare Providers: http://www.biofiredefense.com/wp-content/uploads/2020/03/BIOFIRE-COVID -19-hcp.pdf This test is not yet approved or cleared by the [place] and  has been authorized for detection and/or diagnosis of SARS-CoV-2 by FDA under an Emergency Use Authorization (EUA).  This EUA will remain in effec t (meaning this test can be used) for the duration of  the COVID-19 declaration under Section 564(b)(1) of the Act, 21 U.S.C. section 360bbb-3(b)(1), unless the authorization is terminated or revoked sooner. Performed at [facility], [place] [address], [place], [place] [address]     Janene Madeira, MSN, NP-C [facility] for [facility] [place] Pager: [phone]  10/11/2018 10:31 AM

## 2018-10-11 NOTE — Progress Notes (Signed)
Patient with history of lumbar decompression and fusion surgery done by Dr. Saintclair Halsted approximately 3 months ago.  Patient with worsening back pain.  Patient with some radicular symptoms into both posterior lower extremities.  Prior work-up is demonstrated a destructive lesion at T12-L1.  Patient recently admitted to an outside hospital with a diabetic toe infection requiring amputation with MSSA bacteremia.  Patient currently on antibiotic treatment for this.  Awake and alert.  Oriented and reasonably appropriate.  Patient not ambulatory.  Motor examination with diffuse weakness of both lower extremities and absent dorsiflexion bilaterally.  This is apparently stable.  Patient does have some pain with straight leg raising bilaterally.  Destructive lesion at T12-L1 needs to be better characterized.  I recommend CT-guided biopsy of disc space to evaluate for possible discitis.  Continue IV antibiotics.  Depending on results of CT aspiration Dr. Saintclair Halsted may choose to extend the patient's fusion to his mid thoracic region for better stabilization and pain control.

## 2018-10-11 NOTE — Consult Note (Signed)
[facility] Nurse wound consult note Reason for Consult: Patient with recent amputation on left foot.  Seen by his surgeon in [place] yesterday.  Wound type: Surgical Pressure Injury POA: NA Measurement: 6.5cm incision line with open at medial end measuring.5cm x 2.5cm x 1.5cm. Sutures intact at lateral end. Wound bed: 90%red, moist Drainage (amount, consistency, odor) small amount serous exudate Periwound: intact, dry Dressing procedure/placement/frequency: Patient was changing dressing every 3 days.  I will ask Nursing to change daily.  Upon discharge, patient to follow up with his surgeon for suture removal and aftercare assessments.  [facility] nursing team will not follow, but will remain available to this patient, the nursing and medical teams.  Please re-consult if needed. Thanks, Maudie Flakes, MSN, RN, [place], Arther Abbott  Pager# [phone]

## 2018-10-11 NOTE — Progress Notes (Signed)
PROGRESS NOTE  Henry Mcpherson KGM:[id number] DOB: 05-03-1956 DOA: 10/10/2018 PCP: System, Pcp Not In   LOS: 1 day   Patient is from: Home  Brief Narrative / Interim history: 63 y.o. male with medical history significant ofsCHF with EF 45%, A. 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  Patient had surgical resection of left foot by podiatry at OSH.  On day 11 of vancomycin on day of transfer.  Repeat blood culture reportedly negative.  He states that he continues to have back pain, bilateral leg weakness, difficulty walking.  He states that he has decreased sensation in both legs which he attributes to diabetic neuropathy.  Denies new bowel or bladder issues other than usual poor bowel control.  Reportedly had lumbar X-ray 3 weeks ago, and finding was concerning for possible infection as per Dr. Windy Carina review.  On arrival here, hemodynamically stable.  Hemoglobin 7.8 (about baseline).  Electrolytes not impressive.  COVID-19 negative.  Per admitting provider, Dr. Ellene Route from neurosurgery consulted on arrival.  CT lumbar spine showed superior migration of left L4 screw, loosening of left L3 screw, discitis at T12-L1 with regional osteomyelitis and possible discitis at L3-4 and L5-S1.  Subjective: No major events overnight of this morning.  No complaint this morning.  Denies chest pain, dyspnea, abdominal pain, fever or chills.  Reports bilateral lower extremity weakness and numbness (not new).   Assessment & Plan: MRSA bacteremia with T12-L1 discitis and regional osteomyelitis:  -Treated with vancomycin for 11 days prior to arrival here  -Repeat blood culture at OSH reportedly negative  -Infectious  disease consulted-appreciate input -Rifampin added per ID -Follow TTE and blood. -We will consult cardiology for TEE and an MRI compatibility of pacemaker -Check ESR and CRP  Cauda equina syndrome and spinal stenosis of lumbar region: s/p of lumbar decompression and fusion by Dr. Saintclair Halsted on 3/4.   -CT lumbar spine concerning for loosening of the screws, discitis and osteomyelitis as above -Appreciate neurosurgery recommendation-CT-guided aspiration of the discitis -Antibiotic as above -PRN Percocet and Flexeril abdominal for pain.  Left foot infection-great toe: s/p of amputation by podiatry at outside hospital.  -Antibiotic as above -Appreciate wound care input  Chronic obstructive lung disease ([place]): stable. 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 -Check A1c -Levemir 10 units daily -SSI  Essential hypertension: Normotensive allow yes -IV Hydralazine prn -Continue home metoprolol  Chronic systolic CHF (congestive heart failure) ([place]):  TTE on 07/05/2018 showed EF 45-50%.  No cardiopulmonary symptoms.  No signs of fluid overload except for bilateral lower extremity edema likely chronic.  -Continue home oral Lasix 40 mg daily -Daily weight, intake output and renal function -Monitor electrolytes and replenish  Permanent atrial Fibrillation: Rate controlled with metoprolol.  CHA2DS2-VASc Score is 5 but not on AC.  Now he has possible GIB with stool guaiac positive.  -continue metoprolol and digoxin  CAD (coronary artery disease): s/p of stent and CABG. No Cp. -continue lipitor and metoprolol  Chronic blood loss anemia and anemia of chronic disease:  FOBT positive.  Hb 8.4 (about baseline).  -protonix 40 bid -Monitor H&H Scheduled Meds: . acidophilus  1 capsule Oral Daily  . atorvastatin  40 mg Oral Daily  . digoxin  0.125 mg Oral Daily  . furosemide  40 mg Oral Daily   . insulin aspart  0-9 Units Subcutaneous TID WC  . insulin detemir  10 Units Subcutaneous Daily  . metoprolol succinate  100 mg Oral BID  . nicotine  14 mg Transdermal Daily  . pantoprazole  40 mg Oral BID  . rifampin  300 mg Oral Q8H   Continuous Infusions: . sodium chloride Stopped (10/11/18 0249)  . vancomycin Stopped (10/11/18 0249)   PRN Meds:.sodium chloride, acetaminophen, albuterol, cyclobenzaprine, hydrALAZINE, ondansetron (ZOFRAN) IV, oxyCODONE-acetaminophen   DVT prophylaxis: SCD Code Status: Full code Family Communication: Pending Disposition Plan: Remains inpatient for MRSA bacteremia, discitis, osteomyelitis and diabetic left foot wound  Consultants:   Neurosurgery  Infectious disease  Procedures:   None  Microbiology: . Initial blood culture at outside hospital reportedly MRSA . Repeat blood cultures outside hospital reportedly negative . Blood culture on 10/10/2018-negative so far . MRSA PCR negative . COVID-19 negative  Antimicrobials: Anti-infectives (From admission, onward)   Start     Dose/Rate Route Frequency Ordered Stop   10/11/18 1400  rifampin (RIFADIN) capsule 300 mg     300 mg Oral Every 8 hours 10/11/18 1100     10/10/18 2130  vancomycin (VANCOCIN) IVPB 1000 mg/200 mL premix     1,000 mg 200 mL/hr over 60 Minutes Intravenous Every 24 hours 10/10/18 2126         Objective: Vitals:   10/10/18 2000 10/10/18 2157 10/11/18 0515 10/11/18 0856  BP:  (!) 115/56 109/60 (!) 91/56  Pulse:  (!) 51 (!) 55 (!) 47  Resp:  16 16 18   Temp:  98.4 F (36.9 C) 98.4 F (36.9 C) 97.7 F (36.5 C)  TempSrc:  Oral Oral Oral  SpO2:  100% 98% 99%  Weight: 86.4 kg     Height: 6' 2"  (1.88 m)       Intake/Output Summary (Last 24 hours) at 10/11/2018 1249 Last data filed at 10/11/2018 0910 Gross per 24 hour  Intake 392.09 ml  Output 900 ml  Net -507.91 ml   Filed Weights   10/10/18 2000  Weight: 86.4 kg    Examination:  GENERAL: No acute  distress.  Appears well.  HEENT: MMM.  Vision and hearing grossly intact.  NECK: Supple.  No JVD.  LUNGS:  No IWOB. Good air movement bilaterally. HEART: Irregular rhythm.  Normal rate. Heart sounds normal.  ABD: Bowel sounds present. Soft. Non tender.  MSK/EXT:  Moves all extremities. No apparent deformity.  2+ pitting edema bilaterally SKIN: Dressing over left foot.  Dressing appears clean and dry.  No signs of inflammation or infection proximally. NEURO: Awake, alert and oriented appropriately.  Motor 3/5 in both lower extremities.  Light sensation diminished but symmetric.  Patellar reflex symmetric. PSYCH: Calm. Normal affect.    Data Reviewed: I have independently reviewed following labs and imaging studies  CBC: Recent Labs  Lab 10/10/18 2004 10/11/18 0611  WBC 15.8* 11.2*  HGB 9.0* 8.4*  HCT 30.0* 27.8*  MCV 86.7 86.9  PLT 379 993   Basic Metabolic Panel: Recent Labs  Lab 10/10/18 2004 10/10/18 2028 10/11/18 0611  NA 141  --  140  K 3.4*  --  3.2*  CL 108  --  105  CO2 27  --  27  GLUCOSE 146*  --  128*  BUN 15  --  15  CREATININE 0.69 0.69 0.63  CALCIUM 8.6*  --  8.5*   GFR: Estimated Creatinine Clearance: 107 mL/min (by C-G formula based on SCr of 0.63 mg/dL). Liver Function Tests: No results for input(s): AST, ALT, ALKPHOS, BILITOT, PROT, ALBUMIN in the last 168 hours. No results for input(s): LIPASE, AMYLASE in the last 168 hours. No results for input(s): AMMONIA in the last 168 hours. Coagulation Profile: Recent Labs  Lab 10/11/18 0611  INR 1.2   Cardiac Enzymes: No results for input(s): CKTOTAL, CKMB, CKMBINDEX, TROPONINI in the last 168 hours. BNP (last 3 results) No results for input(s): PROBNP in the last 8760 hours. HbA1C: No results for input(s): HGBA1C in the last 72 hours. CBG: Recent Labs  Lab 10/10/18 2003 10/11/18 0712 10/11/18 1120  GLUCAP 128* 95 75   Lipid Profile: No results for input(s): CHOL, HDL, LDLCALC, TRIG, CHOLHDL,  LDLDIRECT in the last 72 hours. Thyroid Function Tests: No results for input(s): TSH, T4TOTAL, FREET4, T3FREE, THYROIDAB in the last 72 hours. Anemia Panel: No results for input(s): VITAMINB12, FOLATE, FERRITIN, TIBC, IRON, RETICCTPCT in the last 72 hours. Urine analysis:    Component Value Date/Time   COLORURINE YELLOW 07/08/2018 0224   APPEARANCEUR CLEAR 07/08/2018 0224   LABSPEC 1.011 07/08/2018 0224   PHURINE 6.0 07/08/2018 0224   GLUCOSEU NEGATIVE 07/08/2018 0224   HGBUR SMALL (A) 07/08/2018 0224   BILIRUBINUR NEGATIVE 07/08/2018 0224   KETONESUR NEGATIVE 07/08/2018 0224   PROTEINUR NEGATIVE 07/08/2018 0224   NITRITE NEGATIVE 07/08/2018 0224   LEUKOCYTESUR SMALL (A) 07/08/2018 0224   Sepsis Labs: Invalid input(s): PROCALCITONIN, LACTICIDVEN  Recent Results (from the past 240 hour(s))  Culture, blood (Routine X 2) w Reflex to ID Panel     Status: None (Preliminary result)   Collection Time: 10/10/18  7:55 PM  Result Value Ref Range Status   Specimen Description BLOOD LEFT ARM  Final   Special Requests   Final    BOTTLES DRAWN AEROBIC ONLY Blood Culture adequate volume   Culture   Final    NO GROWTH < 24 HOURS Performed at [facility], [address] [place], [place] [address]    Report Status PENDING  Incomplete  Culture, blood (Routine X 2) w Reflex to ID Panel     Status: None (Preliminary result)   Collection Time: 10/10/18  8:00 PM  Result Value Ref Range Status   Specimen Description BLOOD LEFT ARM  Final   Special Requests   Final    BOTTLES DRAWN AEROBIC ONLY Blood Culture adequate volume   Culture   Final    NO GROWTH < 24 HOURS Performed at [facility], [place] [address], [place], [place] [address]    Report Status PENDING  Incomplete  Surgical pcr screen     Status: None   Collection Time: 10/11/18 12:21 AM  Result Value Ref Range Status   MRSA, PCR NEGATIVE NEGATIVE Final   Staphylococcus aureus NEGATIVE NEGATIVE Final    Comment: (NOTE)  The Xpert SA Assay (FDA approved for NASAL specimens in patients 76 years of age and older), is one component of a comprehensive surveillance program. It is not intended to diagnose infection nor to guide or monitor treatment. Performed at [facility], [place] [address] [place], [place] [address]   SARS Coronavirus 2     Status: None   Collection Time: 10/11/18 12:23 AM  Result Value Ref Range Status   SARS Coronavirus 2 NOT DETECTED NOT DETECTED Final    Comment: (NOTE) SARS-CoV-2 target nucleic acids are  NOT DETECTED. The SARS-CoV-2 RNA is generally detectable in upper and lower respiratory specimens during the acute phase of infection.  Negative  results do not preclude SARS-CoV-2 infection, do not rule out co-infections with other pathogens, and should not be used as the sole basis for treatment or other patient management decisions.  Negative results must be combined with clinical observations, patient history, and epidemiological information. The expected result is Not Detected. Fact Sheet for Patients: http://www.biofiredefense.com/wp-content/uploads/2020/03/BIOFIRE-COVID -19-patients.pdf Fact Sheet for Healthcare Providers: http://www.biofiredefense.com/wp-content/uploads/2020/03/BIOFIRE-COVID -19-hcp.pdf This test is not yet approved or cleared by the [place] and  has been authorized for detection and/or diagnosis of SARS-CoV-2 by FDA under an Emergency Use Authorization (EUA).  This EUA will remain in effec t (meaning this test can be used) for the duration of  the COVID-19 declaration under Section 564(b)(1) of the Act, 21 U.S.C. section 360bbb-3(b)(1), unless the authorization is terminated or revoked sooner. Performed at [facility], [place] [address] [place], [place] [address]       Radiology Studies: Ct Lumbar Spine W Contrast  Result Date: 10/11/2018 CLINICAL DATA:  MRSA bacteremia. Recent lumbar fusion. Elevated white count. EXAM: CT  LUMBAR SPINE WITH CONTRAST TECHNIQUE: Multidetector CT imaging of the lumbar spine was performed with intravenous contrast administration. CONTRAST:  37m OMNIPAQUE IOHEXOL 300 MG/ML  SOLN COMPARISON:  Outside CT myelogram 06/26/2018. Outside CT postoperative 09/07/2018. FINDINGS: Segmentation: Standard. Alignment: Degenerative scoliosis convex RIGHT apex L3. Trace degenerative anterolisthesis L4-5. Adequate posterior decompression L2 through L5. Vertebrae: Sclerosis L3 through S1. Endplate irregularity above and below L3-4 and L5-S1 is indeterminate. Endplate destruction above and below T12-L1, concerning for infection. Paraspinal and other soft tissues: No concerning postoperative fluid collections. Aortic atherosclerosis. No hydronephrosis. Disc levels: T12-L1: Widening of the disc space, with endplate irregularity, concerning for discitis and regional osteomyelitis. Further endplate destruction compared with May 2020 lumbar CT. Osseous spurring greater to the RIGHT, results in subarticular zone and foraminal zone narrowing which could affect the T12 and L1 nerve roots. No visible epidural enhancement. L1-L2: Disc space narrowing with osseous spurring. Vacuum phenomenon. Posterior osseous spurring. Mild stenosis. LEFT greater than RIGHT L1 and L2 neural impingement are possible. L2-L3: Spontaneous arthrodesis across and interspace with complete loss of disc height. No residual impingement post lumbar decompression. L3-L4: Endplate reactive changes, with widening of the interspace, mildly progressed from priors, could represent discitis. Loosening of the LEFT L3 screw. LEFT L4 screw extends into the L3-4 interspace. Adequate posterior decompression. No definite residual foraminal or subarticular zone narrowing. L4-L5: Severe to disc space narrowing similar to priors. Posterior osseous spurring. Hardware intact. No RIGHT L4 screw. Adequate posterior decompression but foraminal narrowing on the LEFT could affect the  L4 nerve root. L5-S1: Mild widening of the disc space similar to priors. Posterior osseous spurring. Hardware intact. IMPRESSION: 1. Status post L2 through L5 posterior decompression. Superior migration of the LEFT L4 screw. Loosening of the LEFT L3 screw. 2. Further endplate destruction with widening of the interspace at T12-L1, concerning for discitis and regional osteomyelitis. 3. Similar widening of the disc space with endplate irregularity at L3-4 and L5-S1 is not clearly progressed from priors, but indolent infection is not excluded. 4. It is unclear if the patient's pacemaker is MR compatible, but if so, MRI lumbar spine without and with contrast could provide additional information. Electronically Signed   By: JStaci RighterM.D.   On: 10/11/2018 08:53    35 minutes with more than 50% spent in reviewing records,  counseling patient and coordinating care.  Taye T. [facility] Pager [phone]  If 7PM-7AM, please contact night-coverage [email or web address] Password [facility] 10/11/2018, 12:49 PM

## 2018-10-12 ENCOUNTER — Inpatient Hospital Stay (HOSPITAL_COMMUNITY): Payer: Medicare Other

## 2018-10-12 DIAGNOSIS — R7881 Bacteremia: Secondary | ICD-10-CM

## 2018-10-12 DIAGNOSIS — Z950 Presence of cardiac pacemaker: Secondary | ICD-10-CM

## 2018-10-12 DIAGNOSIS — L089 Local infection of the skin and subcutaneous tissue, unspecified: Secondary | ICD-10-CM

## 2018-10-12 DIAGNOSIS — B9562 Methicillin resistant Staphylococcus aureus infection as the cause of diseases classified elsewhere: Secondary | ICD-10-CM

## 2018-10-12 DIAGNOSIS — Z981 Arthrodesis status: Secondary | ICD-10-CM

## 2018-10-12 DIAGNOSIS — M4625 Osteomyelitis of vertebra, thoracolumbar region: Secondary | ICD-10-CM

## 2018-10-12 DIAGNOSIS — Z888 Allergy status to other drugs, medicaments and biological substances status: Secondary | ICD-10-CM

## 2018-10-12 DIAGNOSIS — Z89412 Acquired absence of left great toe: Secondary | ICD-10-CM

## 2018-10-12 DIAGNOSIS — I499 Cardiac arrhythmia, unspecified: Secondary | ICD-10-CM

## 2018-10-12 DIAGNOSIS — M4645 Discitis, unspecified, thoracolumbar region: Secondary | ICD-10-CM

## 2018-10-12 HISTORY — PX: IR LUMBAR DISC ASPIRATION W/IMG GUIDE: IMG5306

## 2018-10-12 LAB — CBC
HCT: 28.1 % — ABNORMAL LOW (ref 39.0–52.0)
Hemoglobin: 8.2 g/dL — ABNORMAL LOW (ref 13.0–17.0)
MCH: 25.5 pg — ABNORMAL LOW (ref 26.0–34.0)
MCHC: 29.2 g/dL — ABNORMAL LOW (ref 30.0–36.0)
MCV: 87.5 fL (ref 80.0–100.0)
Platelets: 402 10*3/uL — ABNORMAL HIGH (ref 150–400)
RBC: 3.21 MIL/uL — ABNORMAL LOW (ref 4.22–5.81)
RDW: 17.6 % — ABNORMAL HIGH (ref 11.5–15.5)
WBC: 13.2 10*3/uL — ABNORMAL HIGH (ref 4.0–10.5)
nRBC: 0 % (ref 0.0–0.2)

## 2018-10-12 LAB — GLUCOSE, CAPILLARY
Glucose-Capillary: 110 mg/dL — ABNORMAL HIGH (ref 70–99)
Glucose-Capillary: 132 mg/dL — ABNORMAL HIGH (ref 70–99)
Glucose-Capillary: 140 mg/dL — ABNORMAL HIGH (ref 70–99)
Glucose-Capillary: 67 mg/dL — ABNORMAL LOW (ref 70–99)
Glucose-Capillary: 81 mg/dL (ref 70–99)

## 2018-10-12 LAB — C-REACTIVE PROTEIN: CRP: 1.5 mg/dL — ABNORMAL HIGH (ref <1.0)

## 2018-10-12 LAB — SEDIMENTATION RATE: Sed Rate: 50 mm/hr — ABNORMAL HIGH (ref 0–16)

## 2018-10-12 MED ORDER — GENERIC EXTERNAL MEDICATION
Status: DC
Start: ? — End: 2018-10-12

## 2018-10-12 MED ORDER — FENTANYL CITRATE (PF) 100 MCG/2ML IJ SOLN
INTRAMUSCULAR | Status: AC | PRN
  Administered 2018-10-12: 25 ug via INTRAVENOUS

## 2018-10-12 MED ORDER — LIDOCAINE HCL (PF) 1 % IJ SOLN
INTRAMUSCULAR | Status: AC | PRN
  Administered 2018-10-12: 10 mL

## 2018-10-12 MED ORDER — MIDAZOLAM HCL 2 MG/2ML IJ SOLN
INTRAMUSCULAR | Status: AC
  Filled 2018-10-12: qty 2

## 2018-10-12 MED ORDER — FENTANYL CITRATE (PF) 100 MCG/2ML IJ SOLN
INTRAMUSCULAR | Status: AC
  Filled 2018-10-12: qty 2

## 2018-10-12 MED ORDER — BUPIVACAINE HCL (PF) 0.5 % IJ SOLN
INTRAMUSCULAR | Status: AC
  Filled 2018-10-12: qty 30

## 2018-10-12 MED ORDER — MIDAZOLAM HCL 2 MG/2ML IJ SOLN
INTRAMUSCULAR | Status: AC | PRN
  Administered 2018-10-12: 1 mg via INTRAVENOUS

## 2018-10-12 MED ORDER — SODIUM CHLORIDE 0.9 % IV SOLN
INTRAVENOUS | Status: DC
  Administered 2018-10-12 – 2018-10-13 (×2): via INTRAVENOUS

## 2018-10-12 MED ORDER — DEXTROSE-NACL 5-0.45 % IV SOLN
INTRAVENOUS | Status: DC
  Administered 2018-10-12: 08:00:00 via INTRAVENOUS

## 2018-10-12 MED ORDER — DEXTROSE 50 % IV SOLN
INTRAVENOUS | Status: AC
  Administered 2018-10-12: 50 mL
  Filled 2018-10-12: qty 50

## 2018-10-12 MED ORDER — DEXTROSE 50 % IV SOLN
INTRAVENOUS | Status: AC
  Filled 2018-10-12: qty 50

## 2018-10-12 MED ORDER — BUPIVACAINE HCL (PF) 0.25 % IJ SOLN
INTRAMUSCULAR | Status: AC | PRN
  Administered 2018-10-12: 20 mL

## 2018-10-12 MED ORDER — SODIUM CHLORIDE 0.9 % IV SOLN: INTRAVENOUS | Status: AC

## 2018-10-12 NOTE — Consult Note (Signed)
Chief Complaint: Patient was seen in consultation today for T12-L1 discitis/aspiration.  Referring Physician(s): Mercy Riding  Supervising Physician: Luanne Bras  Patient Status: [facility] - In-pt  History of Present Illness: Henry Mcpherson is a 63 y.o. male with a past medical history of hypertension, HF, COPD, and diabetes mellitus type II. He was recently admitted to [facility] for the past 11 days for management of left great toe diabetic infection s/p amputation. He was [facility]10[facility]2018 for admission and management of back pain, bilateral leg weakness, foot infection, and MRSA bacteremia. Of note, patient underwent lumbar decompression and fusion in OR 07/05/2018 by Dr. Saintclair Halsted due to cauda equina syndrome. Patient continues to have back pain, bilateral leg weakness, and difficulty walking post-op. CT lumbar spine was obtained 10/11/2018 which revealed T12-L1 endplate destruction (with widening of the interspace) concerning for discitis and regional osteomyelitis. Neurosurgery was consulted who recommends IR consultation for possible image-guided disc T12-L1 aspiration.  CT lumbar spine 10/11/2018: 1. Status post L2 through L5 posterior decompression. Superior migration of the LEFT L4 screw. Loosening of the LEFT L3 screw. 2. Further endplate destruction with widening of the interspace at T12-L1, concerning for discitis and regional osteomyelitis. 3. Similar widening of the disc space with endplate irregularity at L3-4 and L5-S1 is not clearly progressed from priors, but indolent infection is not excluded. 4. It is unclear if the patient's pacemaker is MR compatible, but if so, MRI lumbar spine without and with contrast could provide additional information.  IR consulted by [facility] for possible image-guided T12-L1 disc aspiration. Patient awake and alert laying in bed. Complains of low back pain, rated 6/10 at this time. States he has associated  numbness/tingling down bilateral legs. Denies fever, chills, chest pain, dyspnea, abdominal pain, or headache.   Past Medical History:  Diagnosis Date   CHF (congestive heart failure) (HCC)    COPD (chronic obstructive pulmonary disease) (HCC)    Diabetes mellitus without complication ([place])    Hypertension     Past Surgical History:  Procedure Laterality Date   BACK SURGERY     CARDIAC SURGERY      Allergies: Warfarin and related  Medications: Prior to Admission medications   Medication Sig Start Date End Date Taking? Authorizing Provider  acetaminophen (TYLENOL) 325 MG tablet Take 2 tablets (650 mg total) by mouth every 4 (four) hours as needed for mild pain ((score 1 to 3) or temp > 100.5). 07/12/18  Yes Masoudi, Elhamalsadat, MD  albuterol (PROVENTIL HFA;VENTOLIN HFA) 108 (90 Base) MCG/ACT inhaler Inhale 1-2 puffs into the lungs every 6 (six) hours as needed for wheezing or shortness of breath.   Yes [provider]  atorvastatin (LIPITOR) 40 MG tablet Take 40 mg by mouth daily.   Yes [provider]  [employer] VITAMIN B12 1000 MCG tablet Take 1,000 mcg by mouth daily. 09/05/18  Yes [provider]  cyclobenzaprine (FLEXERIL) 10 MG tablet Take 1 tablet (10 mg total) by mouth 2 (two) times daily as needed for muscle spasms. 07/12/18  Yes Masoudi, Elhamalsadat, MD  digoxin (LANOXIN) 0.125 MG tablet Take 0.125 mg by mouth daily. 05/04/18  Yes [provider]  furosemide (LASIX) 40 MG tablet Take 40 mg by mouth daily.   Yes [provider]  LEVEMIR FLEXTOUCH 100 UNIT/ML Pen Inject 15-20 Units into the skin every evening. 09/05/18  Yes [provider]  metolazone (ZAROXOLYN) 2.5 MG tablet Take 2.5 mg by mouth daily as needed for fluid.  07/05/18  Yes [provider]  metoprolol succinate (TOPROL-XL) 100 MG 24 hr tablet Take 100 mg by mouth 2 (two) times daily. 06/16/18  Yes [provider]  nicotine (NICODERM CQ - DOSED IN  MG/24 HOURS) 14 mg/24hr patch Place 1 patch (14 mg total) onto the skin daily. 07/13/18  Yes Masoudi, Elhamalsadat, MD  pantoprazole (PROTONIX) 40 MG tablet Take 40 mg by mouth 2 (two) times daily. 05/26/18  Yes [provider]  Probiotic Product (PROBIOTIC DAILY PO) Take 1 capsule by mouth daily.   Yes [provider]  senna-docusate (SENOKOT-S) 8.6-50 MG tablet Take 1 tablet by mouth 2 (two) times daily as needed for mild constipation. 07/12/18  Yes Masoudi, Elhamalsadat, MD  sucralfate (CARAFATE) 1 g tablet Take 1 g by mouth 4 (four) times daily. 09/05/18  Yes [provider]  traMADol (ULTRAM) 50 MG tablet Take 50 mg by mouth every 6 (six) hours as needed for pain. 09/14/18  Yes [provider]  traZODone (DESYREL) 50 MG tablet Take 50 mg by mouth at bedtime as needed for sleep. 10/06/18  Yes [provider]     No family history on file.  Social History   Socioeconomic History   Marital status: Married    Spouse name: Not on file   Number of children: Not on file   Years of education: Not on file   Highest education level: Not on file  Occupational History   Not on file  Social Needs   Financial resource strain: Not on file   Food insecurity    Worry: Not on file    Inability: Not on file   Transportation needs    Medical: Not on file    Non-medical: Not on file  Tobacco Use   Smoking status: Former Smoker   Smokeless tobacco: Never Used  Substance and Sexual Activity   Alcohol use: Not Currently   Drug use: Not Currently   Sexual activity: Not Currently  Lifestyle   Physical activity    Days per week: Not on file    Minutes per session: Not on file   Stress: Not on file  Relationships   Social connections    Talks on phone: Not on file    Gets together: Not on file    Attends religious service: Not on file    Active member of club or organization: Not on file    Attends meetings of clubs or organizations: Not on  file    Relationship status: Not on file  Other Topics Concern   Not on file  Social History Narrative   Not on file     Review of Systems: A 12 point ROS discussed and pertinent positives are indicated in the HPI above.  All other systems are negative.  Review of Systems  Constitutional: Negative for chills and fever.  Respiratory: Negative for shortness of breath and wheezing.   Cardiovascular: Negative for chest pain and palpitations.  Gastrointestinal: Negative for abdominal pain.  Neurological: Positive for numbness. Negative for headaches.  Psychiatric/Behavioral: Negative for behavioral problems and confusion.    Vital Signs: BP 115/70 (BP Location: Right Arm)    Pulse (!) 59    Temp 97.7 F (36.5 C) (Oral)    Resp 18    Ht 6\' 2"  (1.88 m)    Wt 190 lb 0.6 oz (86.2 kg)    SpO2 99%    BMI 24.40 kg/m   Physical Exam Vitals signs and nursing note reviewed.  Constitutional:      General: He is not in acute distress.    Appearance: Normal appearance.  Cardiovascular:     Rate and Rhythm: Normal rate and regular rhythm.     Heart sounds: Normal heart sounds. No murmur.  Pulmonary:     Effort: Pulmonary effort is normal. No respiratory distress.     Breath sounds: Normal breath sounds. No wheezing.  Musculoskeletal:     Comments: Mild-moderate midline back pain at approximate level of T12-L1.  Skin:    General: Skin is warm and dry.  Neurological:     Mental Status: He is alert and oriented to person, place, and time.  Psychiatric:        Mood and Affect: Mood normal.        Behavior: Behavior normal.        Thought Content: Thought content normal.        Judgment: Judgment normal.      MD Evaluation Airway: WNL Heart: WNL Abdomen: WNL Chest/ Lungs: WNL ASA  Classification: 2 Mallampati/Airway Score: One   Imaging: Ct Lumbar Spine W Contrast  Result Date: 10/11/2018 CLINICAL DATA:  MRSA bacteremia. Recent lumbar fusion. Elevated white count. EXAM: CT  LUMBAR SPINE WITH CONTRAST TECHNIQUE: Multidetector CT imaging of the lumbar spine was performed with intravenous contrast administration. CONTRAST:  38mL OMNIPAQUE IOHEXOL 300 MG/ML  SOLN COMPARISON:  Outside CT myelogram 06/26/2018. Outside CT postoperative 09/07/2018. FINDINGS: Segmentation: Standard. Alignment: Degenerative scoliosis convex RIGHT apex L3. Trace degenerative anterolisthesis L4-5. Adequate posterior decompression L2 through L5. Vertebrae: Sclerosis L3 through S1. Endplate irregularity above and below L3-4 and L5-S1 is indeterminate. Endplate destruction above and below T12-L1, concerning for infection. Paraspinal and other soft tissues: No concerning postoperative fluid collections. Aortic atherosclerosis. No hydronephrosis. Disc levels: T12-L1: Widening of the disc space, with endplate irregularity, concerning for discitis and regional osteomyelitis. Further endplate destruction compared with May 2020 lumbar CT. Osseous spurring greater to the RIGHT, results in subarticular zone and foraminal zone narrowing which could affect the T12 and L1 nerve roots. No visible epidural enhancement. L1-L2: Disc space narrowing with osseous spurring. Vacuum phenomenon. Posterior osseous spurring. Mild stenosis. LEFT greater than RIGHT L1 and L2 neural impingement are possible. L2-L3: Spontaneous arthrodesis across and interspace with complete loss of disc height. No residual impingement post lumbar decompression. L3-L4: Endplate reactive changes, with widening of the interspace, mildly progressed from priors, could represent discitis. Loosening of the LEFT L3 screw. LEFT L4 screw extends into the L3-4 interspace. Adequate posterior decompression. No definite residual foraminal or subarticular zone narrowing. L4-L5: Severe to disc space narrowing similar to priors. Posterior osseous spurring. Hardware intact. No RIGHT L4 screw. Adequate posterior decompression but foraminal narrowing on the LEFT could affect the  L4 nerve root. L5-S1: Mild widening of the disc space similar to priors. Posterior osseous spurring. Hardware intact. IMPRESSION: 1. Status post L2 through L5 posterior decompression. Superior migration of the LEFT L4 screw. Loosening of the LEFT L3 screw. 2. Further endplate destruction with widening of the interspace at T12-L1, concerning for discitis and regional osteomyelitis. 3. Similar widening of the disc space with endplate irregularity at L3-4 and L5-S1 is not clearly progressed from priors, but indolent infection is not excluded. 4. It is unclear if the patient's pacemaker is MR compatible, but if so, MRI lumbar spine without and with contrast could provide additional information. Electronically Signed   By: Elsie Stain M.D.   On: 10/11/2018 08:53    Labs:  CBC:  Recent Labs    07/13/18 0407 10/10/18 2004 10/11/18 0611 10/12/18 0552  WBC 15.6* 15.8* 11.2* 13.2*  HGB 8.4* 9.0* 8.4* 8.2*  HCT 28.0* 30.0* 27.8* 28.1*  PLT 403* 379 342 402*    COAGS: Recent Labs    06/29/18 1459 06/30/18 0316 10/11/18 0611  INR 1.0 1.1 1.2  APTT 32  --  35    BMP: Recent Labs    07/12/18 0616 07/13/18 0407 10/10/18 2004 10/10/18 2028 10/11/18 0611  NA 141 141 141  --  140  K 3.6 3.4* 3.4*  --  3.2*  CL 104 105 108  --  105  CO2 28 30 27   --  27  GLUCOSE 103* 42* 146*  --  128*  BUN 17 15 15   --  15  CALCIUM 8.4* 8.2* 8.6*  --  8.5*  CREATININE 0.76 0.70 0.69 0.69 0.63  GFRNONAA >60 >60 >60 >60 >60  GFRAA >60 >60 >60 >60 >60    LIVER FUNCTION TESTS: Recent Labs    07/10/18 0414 07/11/18 0712 07/12/18 0616 07/13/18 0407  BILITOT 0.5 0.5 0.6 0.5  AST 73* 70* 40 31  ALT 68* 82* 63* 50*  ALKPHOS 89 94 90 91  PROT 4.7* 4.5* 4.6* 4.9*  ALBUMIN 1.6* 1.7* 1.7* 1.7*     Assessment and Plan:  T12-L1 discitis. Plan for image-guided T12-L1 disc aspiration today with Dr. Corliss Skainseveshwar. Patient is NPO. Afebrile. He does not take blood thinners. INR 1.2 10/11/2018.  Risks  and benefits discussed with the patient including, but not limited to bleeding, infection, damage to adjacent structures or low yield requiring additional tests. All of the patient's questions were answered, patient is agreeable to proceed. Consent signed and in chart.   Thank you for this interesting consult.  I greatly enjoyed meeting Cleda ClarksJohn Lovelace Kampa and look forward to participating in their care.  A copy of this report was sent to the requesting provider on this date.  Electronically Signed: Elwin MochaAlexandra Arleatha Philipps, PA-C 10/12/2018, 10:14 AM   I spent a total of 40 Minutes in face to face in clinical consultation, greater than 50% of which was counseling/coordinating care for T12-L1 discitis/aspiration.

## 2018-10-12 NOTE — Progress Notes (Signed)
Overall stable.  Dissipates aspiration plan for today.  Continue IV antibiotics.  Dr. Saintclair Halsted to assume care on Monday.  No new recommendations.

## 2018-10-12 NOTE — Progress Notes (Signed)
[facility] for Infectious Disease  Date of Admission:  10/10/2018      Total days of antibiotics 14  Vancomycin day 14         Patient ID: Henry Mcpherson is a 63 y.o. 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 acidophilus  1 capsule Oral Daily  . atorvastatin  40 mg Oral Daily  . bupivacaine      . digoxin  0.125 mg Oral Daily  . fentaNYL      . furosemide  40 mg Oral Daily  . insulin aspart  0-9 Units Subcutaneous TID WC  . metoprolol succinate  100 mg Oral BID  . midazolam      . nicotine  14 mg Transdermal Daily  . pantoprazole  40 mg Oral BID  . rifampin  300 mg Oral Q8H    SUBJECTIVE: Had T12-L1 fluoro guided disc aspiration with results pending. He will undergo TEE tomorrow for evaluation of his PPM leads - no mention of of leads on TTE and no large vegetations per that study.   Review of Systems: Review of Systems  Constitutional: Negative for chills and fever.  Gastrointestinal: Negative for abdominal pain, nausea and vomiting.  Musculoskeletal: Positive for back pain.  Skin: Negative for rash.  Neurological: Negative for weakness.    Allergies  Allergen Reactions  . Warfarin And Related Itching and Rash    OBJECTIVE: Vitals:   10/12/18 1050 10/12/18 1055 10/12/18 1100 10/12/18 1201  BP: 124/70 121/77 124/69 110/77   Pulse: 62 (!) 59 (!) 58 (!) 59  Resp: 14 16 20 16   Temp:    97.6 F (36.4 C)  TempSrc:    Oral  SpO2: 100% 100% 100% 100%  Weight:      Height:       Body mass index is 24.4 kg/m.  Physical Exam Constitutional:      Comments: Resting comfortably. Pain from procedure.   Cardiovascular:     Rate and Rhythm: Bradycardia present. Rhythm irregular.     Pulses: Normal pulses.     Comments: PPM again unremarkable Pulmonary:     Effort: Pulmonary effort is normal. No respiratory distress.  Abdominal:     Palpations: Abdomen is soft.     Tenderness: There is no abdominal tenderness.  Musculoskeletal:     Comments: L foot dressing clean and dry   Skin:    General: Skin is warm and dry.     Capillary Refill: Capillary refill takes less than 2 seconds.  Neurological:     Mental Status: He is alert and oriented to person, place, and time.     Lab Results Lab Results  Component Value Date   WBC 13.2 (H) 10/12/2018   HGB 8.2 (L) 10/12/2018   HCT  28.1 (L) 10/12/2018   MCV 87.5 10/12/2018   PLT 402 (H) 10/12/2018    Lab Results  Component Value Date   CREATININE 0.63 10/11/2018   BUN 15 10/11/2018   NA 140 10/11/2018   K 3.2 (L) 10/11/2018   CL 105 10/11/2018   CO2 27 10/11/2018    Lab Results  Component Value Date   ALT 50 (H) 07/13/2018   AST 31 07/13/2018   ALKPHOS 91 07/13/2018   BILITOT 0.5 07/13/2018     Microbiology: Recent Results (from the past 240 hour(s))  Culture, blood (Routine X 2) w Reflex to ID Panel     Status: None (Preliminary result)   Collection Time: 10/10/18  7:55 PM   Specimen: BLOOD  Result Value Ref Range Status   Specimen Description BLOOD LEFT ARM  Final   Special Requests   Final    BOTTLES DRAWN AEROBIC ONLY Blood Culture adequate volume   Culture   Final    NO GROWTH 2 DAYS Performed at [facility], [address], [place], [place] [address]    Report Status PENDING  Incomplete  Culture, blood (Routine X 2) w Reflex to ID  Panel     Status: None (Preliminary result)   Collection Time: 10/10/18  8:00 PM   Specimen: BLOOD  Result Value Ref Range Status   Specimen Description BLOOD LEFT ARM  Final   Special Requests   Final    BOTTLES DRAWN AEROBIC ONLY Blood Culture adequate volume   Culture   Final    NO GROWTH 2 DAYS Performed at [facility], [place] [address] [place], [place] [address]    Report Status PENDING  Incomplete  Surgical pcr screen     Status: None   Collection Time: 10/11/18 12:21 AM   Specimen: Nasal Mucosa; Nasal Swab  Result Value Ref Range Status   MRSA, PCR NEGATIVE NEGATIVE Final   Staphylococcus aureus NEGATIVE NEGATIVE Final    Comment: (NOTE) The Xpert SA Assay (FDA approved for NASAL specimens in patients 27 years of age and older), is one component of a comprehensive surveillance program. It is not intended to diagnose infection nor to guide or monitor treatment. Performed at [facility], [place] [address] [place],  [address]   SARS Coronavirus 2     Status: None   Collection Time: 10/11/18 12:23 AM  Result Value Ref Range Status   SARS Coronavirus 2 NOT DETECTED NOT DETECTED Final    Comment: (NOTE) SARS-CoV-2 target nucleic acids are NOT DETECTED. The SARS-CoV-2 RNA is generally detectable in upper and lower respiratory specimens during the acute phase of infection.  Negative  results do not preclude SARS-CoV-2 infection, do not rule out co-infections with other pathogens, and should not be used as the sole basis for treatment or other patient management decisions.  Negative results must be combined with clinical observations, patient history, and epidemiological information. The expected result is Not Detected. Fact Sheet for Patients: http://www.biofiredefense.com/wp-content/uploads/2020/03/BIOFIRE-COVID -19-patients.pdf Fact Sheet for Healthcare Providers: http://www.biofiredefense.com/wp-content/uploads/2020/03/BIOFIRE-COVID -19-hcp.pdf This  test is not yet approved or cleared by the [place] and  has been authorized for detection and/or diagnosis of SARS-CoV-2 by FDA under an Emergency Use Authorization (EUA).  This EUA will remain in effec t (meaning this test can be used) for the duration of  the COVID-19 declaration under Section 564(b)(1) of the Act, 21 U.S.C. section 360bbb-3(b)(1), unless the authorization is terminated or revoked sooner. Performed at [facility], [place] [address] [place], [place] [address]     Assessment & Plan:   1. MRSA Bacteremia = TTE unremarkable. TEE scheduled tomorrow to evaluate PPM wires and valves with greater sensitivity. Continue vancomycin therapy.   2. 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   3. Thoracolumbar Vertebral Discitis/Osteomyelitis with Hardware Loosening = CT guided vertebral biopsy obtained today for surgical planning. Baseline ESR 50, CRP 2.2. Conventional cultures along with fungal/AFB pending, however given MRSA bacteremia most likely due to same organism.   4. Infection of Left Great Toe = s/p removal 6/02 per OSH podiatry team. WOC recommended to increase to daily dressing changes.   5. Medication monitoring = creatinine stable on therapy.    Janene Madeira, MSN, NP-C [facility] [place] Cell: [phone] Pager: [phone]  10/12/2018  2:24 PM

## 2018-10-12 NOTE — Progress Notes (Signed)
Hypoglycemic Event  CBG: Results for MESHULEM, ONORATO (MRN [id number]) as of 10/12/2018 00:02  Ref. Range 10/11/2018 21:02  Glucose-Capillary Latest Ref Range: 70 - 99 mg/dL 65 (L)    Treatment: 4 oz juice/soda  Symptoms: None  Follow-up CBG: Time: CBG Result:  Results for BOLUWATIFE, FLIGHT (MRN [id number]) as of 10/12/2018 03:48  Ref. Range 10/12/2018 00:12  Glucose-Capillary Latest Ref Range: 70 - 99 mg/dL 81   Possible Reasons for Event: Unknown  Comments/MD notified:    Viviano Simas

## 2018-10-12 NOTE — Progress Notes (Signed)
PROGRESS NOTE  Henry Mcpherson PXT:[id number] DOB: 1955-07-03 DOA: 10/10/2018 PCP: System, Pcp Not In   LOS: 2 days   Patient is from: Home  Brief Narrative / Interim history: 63 y.o. male with medical history significant ofsCHF with EF 45%, A. 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  Patient had surgical resection of left foot by podiatry at OSH.  On day 11 of vancomycin on day of transfer.  Repeat blood culture reportedly negative.  He states that he continues to have back pain, bilateral leg weakness, difficulty walking.  He states that he has decreased sensation in both legs which he attributes to diabetic neuropathy.  Denies new bowel or bladder issues other than usual poor bowel control.  Reportedly had lumbar X-ray 3 weeks ago, and finding was concerning for possible infection as per Dr. Windy Carina review.  On arrival here, hemodynamically stable.  Hemoglobin 7.8 (about baseline).  Electrolytes not impressive.  COVID-19 negative.  Per admitting provider, Dr. Ellene Route from neurosurgery consulted on arrival.  CT lumbar spine showed superior migration of left L4 screw, loosening of left L3 screw, discitis at T12-L1 with regional osteomyelitis and possible discitis at L3-4 and L5-S1.  Subjective: No major events overnight or this morning morning except for borderline low CBG.  He was given D50.  No complaint this morning.  Just had CT-guided biopsy for discitis.    Assessment & Plan: MRSA bacteremia with T12-L1 discitis and regional osteomyelitis:  ESR and CRP 50/1.5 respectively. -Treated with vancomycin for 11 days prior to arrival here  -Repeat blood culture at OSH reportedly negative  -Repeat blood  culture on 10/11/2018 here negative so far. -IR fluoroscopy guided biopsy of the discitis on 10/12/2018-follow cultures. -Infectious disease consulted-appreciate input -Rifampin added per ID on 6/10 -TTE with normal EF and no visible vegetation. -Cardiology consulted for TEE and clarification on MRI compatibility of his pacemaker  Cauda equina syndrome and spinal stenosis of lumbar region: s/p of lumbar decompression and fusion by Dr. Saintclair Halsted on 3/4.   -CT lumbar spine concerning for loosening of the screws, discitis and osteomyelitis as above -Appreciate neurosurgery recommendation -Antibiotic as above -PRN Percocet and Flexeril abdominal for pain.  Left foot infection-great toe: s/p of amputation by podiatry at outside hospital.  -Antibiotic as above -Appreciate wound care input  Chronic obstructive lung disease ([place]): stable. 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  Low CBG  to 67 last night and early this morning while n.p.o. -Check A1c -Hold Levemir -SSI  Essential hypertension: Normotensive allow yes -IV Hydralazine prn -Continue home metoprolol  Chronic systolic CHF (congestive heart failure) ([place]):  TTE on 07/05/2018 showed EF 45-50%.  No cardiopulmonary symptoms.  No signs of fluid overload except for bilateral lower extremity edema likely chronic.  -Continue home oral Lasix 40 mg daily -Daily weight, intake output and renal function -Monitor electrolytes and replenish  Permanent atrial Fibrillation: Rate controlled with metoprolol.  CHA2DS2-VASc Score is 5 but not on AC.  Now he has possible GIB with stool guaiac positive.  -continue metoprolol and digoxin  CAD (coronary artery disease): s/p of stent and CABG. No Cp. -continue lipitor and metoprolol  Chronic blood  loss anemia and anemia of chronic disease:  FOBT positive.  Hb 8.2 (about baseline).  -protonix 40  bid -Monitor H&H  Stage II pressure injury over his left buttock: POA -Appreciate wound care input.  Scheduled Meds: . acidophilus  1 capsule Oral Daily  . atorvastatin  40 mg Oral Daily  . digoxin  0.125 mg Oral Daily  . furosemide  40 mg Oral Daily  . insulin aspart  0-9 Units Subcutaneous TID WC  . metoprolol succinate  100 mg Oral BID  . nicotine  14 mg Transdermal Daily  . pantoprazole  40 mg Oral BID  . rifampin  300 mg Oral Q8H   Continuous Infusions: . sodium chloride Stopped (10/11/18 0249)  . vancomycin 1,000 mg (10/11/18 2041)   PRN Meds:.sodium chloride, acetaminophen, albuterol, cyclobenzaprine, hydrALAZINE, ondansetron (ZOFRAN) IV, oxyCODONE-acetaminophen   DVT prophylaxis: SCD given chronic GI bleed/FOBT positive Code Status: Full code Family Communication: Pending Disposition Plan: Remains inpatient for MRSA bacteremia, discitis, osteomyelitis and diabetic left foot wound  Consultants:   Neurosurgery  Infectious disease  Cardiology  IR  Procedures:   IR fluoroscopy guided biopsy of thoracolumbar discitis on 6/11  Microbiology: . Initial blood culture at outside hospital reportedly MRSA . Repeat blood cultures outside hospital reportedly negative . Blood culture on 10/10/2018-negative so far . MRSA PCR negative . COVID-19 negative  Antimicrobials: Anti-infectives (From admission, onward)   Start     Dose/Rate Route Frequency Ordered Stop   10/11/18 1400  rifampin (RIFADIN) capsule 300 mg     300 mg Oral Every 8 hours 10/11/18 1100     10/10/18 2130  vancomycin (VANCOCIN) IVPB 1000 mg/200 mL premix     1,000 mg 200 mL/hr over 60 Minutes Intravenous Every 24 hours 10/10/18 2126        Objective: Vitals:   10/11/18 0856 10/11/18 1654 10/11/18 2103 10/12/18 0452  BP: (!) 91/56 119/71 129/75 123/73  Pulse: (!) 47 85 (!) 59 (!) 59  Resp: 18 18 16 15   Temp: 97.7 F (36.5 C) 98.1 F (36.7 C) 98.4 F (36.9 C) 97.6 F (36.4 C)  TempSrc: Oral  Oral  Oral  SpO2: 99% 98% 100% 100%  Weight:   86.2 kg   Height:        Intake/Output Summary (Last 24 hours) at 10/12/2018 0750 Last data filed at 10/12/2018 0600 Gross per 24 hour  Intake 802.8 ml  Output 1975 ml  Net -1172.2 ml   Filed Weights   10/10/18 2000 10/11/18 2103  Weight: 86.4 kg 86.2 kg    Examination:  GENERAL: No acute distress.  Appears well.  HEENT: MMM.  Vision and hearing grossly intact.  NECK: Supple.  No JVD.  LUNGS:  No IWOB. Good air movement bilaterally. HEART:  RRR. Heart sounds normal.  ABD: Bowel sounds present. Soft. Non tender.  MSK/EXT: Bilateral lower extremity weakness.  2+ pitting edema bilaterally. SKIN: Pressure skin injury over right buttock.  See picture below for more. NEURO: Awake, alert and oriented appropriately.  Motor 3/5 in both lower extremities.  Light sensation diminished but symmetric.  Patellar reflex symmetric. PSYCH: Calm. Normal affect.  Data Reviewed: I have independently reviewed following labs and imaging studies  CBC: Recent Labs  Lab 10/10/18 2004 10/11/18 0611 10/12/18 0552  WBC 15.8* 11.2* 13.2*  HGB 9.0* 8.4* 8.2*  HCT 30.0* 27.8* 28.1*  MCV 86.7 86.9 87.5  PLT 379 342 032*   Basic Metabolic Panel: Recent Labs  Lab 10/10/18 2004 10/10/18 2028  10/11/18 0611  NA 141  --  140  K 3.4*  --  3.2*  CL 108  --  105  CO2 27  --  27  GLUCOSE 146*  --  128*  BUN 15  --  15  CREATININE 0.69 0.69 0.63  CALCIUM 8.6*  --  8.5*   GFR: Estimated Creatinine Clearance: 107 mL/min (by C-G formula based on SCr of 0.63 mg/dL). Liver Function Tests: No results for input(s): AST, ALT, ALKPHOS, BILITOT, PROT, ALBUMIN in the last 168 hours. No results for input(s): LIPASE, AMYLASE in the last 168 hours. No results for input(s): AMMONIA in the last 168 hours. Coagulation Profile: Recent Labs  Lab 10/11/18 0611  INR 1.2   Cardiac Enzymes: No results for input(s): CKTOTAL, CKMB, CKMBINDEX, TROPONINI in the last 168  hours. BNP (last 3 results) No results for input(s): PROBNP in the last 8760 hours. HbA1C: No results for input(s): HGBA1C in the last 72 hours. CBG: Recent Labs  Lab 10/11/18 1120 10/11/18 1653 10/11/18 2102 10/12/18 0012 10/12/18 0704  GLUCAP 75 84 65* 81 67*   Lipid Profile: No results for input(s): CHOL, HDL, LDLCALC, TRIG, CHOLHDL, LDLDIRECT in the last 72 hours. Thyroid Function Tests: No results for input(s): TSH, T4TOTAL, FREET4, T3FREE, THYROIDAB in the last 72 hours. Anemia Panel: No results for input(s): VITAMINB12, FOLATE, FERRITIN, TIBC, IRON, RETICCTPCT in the last 72 hours. Urine analysis:    Component Value Date/Time   COLORURINE YELLOW 07/08/2018 0224   APPEARANCEUR CLEAR 07/08/2018 0224   LABSPEC 1.011 07/08/2018 0224   PHURINE 6.0 07/08/2018 0224   GLUCOSEU NEGATIVE 07/08/2018 0224   HGBUR SMALL (A) 07/08/2018 0224   BILIRUBINUR NEGATIVE 07/08/2018 0224   KETONESUR NEGATIVE 07/08/2018 0224   PROTEINUR NEGATIVE 07/08/2018 0224   NITRITE NEGATIVE 07/08/2018 0224   LEUKOCYTESUR SMALL (A) 07/08/2018 0224   Sepsis Labs: Invalid input(s): PROCALCITONIN, LACTICIDVEN  Recent Results (from the past 240 hour(s))  Culture, blood (Routine X 2) w Reflex to ID Panel     Status: None (Preliminary result)   Collection Time: 10/10/18  7:55 PM   Specimen: BLOOD  Result Value Ref Range Status   Specimen Description BLOOD LEFT ARM  Final   Special Requests   Final    BOTTLES DRAWN AEROBIC ONLY Blood Culture adequate volume   Culture   Final    NO GROWTH < 24 HOURS Performed at [facility], [address] [place], [place] [address]    Report Status PENDING  Incomplete  Culture, blood (Routine X 2) w Reflex to ID Panel     Status: None (Preliminary result)   Collection Time: 10/10/18  8:00 PM   Specimen: BLOOD  Result Value Ref Range Status   Specimen Description BLOOD LEFT ARM  Final   Special Requests   Final    BOTTLES DRAWN AEROBIC ONLY Blood Culture  adequate volume   Culture   Final    NO GROWTH < 24 HOURS Performed at [facility], [place] [address], [place], [place] [address]    Report Status PENDING  Incomplete  Surgical pcr screen     Status: None   Collection Time: 10/11/18 12:21 AM   Specimen: Nasal Mucosa; Nasal Swab  Result Value Ref Range Status   MRSA, PCR NEGATIVE NEGATIVE Final   Staphylococcus aureus NEGATIVE NEGATIVE Final    Comment: (NOTE) The Xpert SA Assay (FDA approved for NASAL specimens in patients 28 years of age and older), is one  component of a comprehensive surveillance program. It is not intended to diagnose infection nor to guide or monitor treatment. Performed at [facility], [place] [address], [place], [place] [address]   SARS Coronavirus 2     Status: None   Collection Time: 10/11/18 12:23 AM  Result Value Ref Range Status   SARS Coronavirus 2 NOT DETECTED NOT DETECTED Final    Comment: (NOTE) SARS-CoV-2 target nucleic acids are NOT DETECTED. The SARS-CoV-2 RNA is generally detectable in upper and lower respiratory specimens during the acute phase of infection.  Negative  results do not preclude SARS-CoV-2 infection, do not rule out co-infections with other pathogens, and should not be used as the sole basis for treatment or other patient management decisions.  Negative results must be combined with clinical observations, patient history, and epidemiological information. The expected result is Not Detected. Fact Sheet for Patients: http://www.biofiredefense.com/wp-content/uploads/2020/03/BIOFIRE-COVID -19-patients.pdf Fact Sheet for Healthcare Providers: http://www.biofiredefense.com/wp-content/uploads/2020/03/BIOFIRE-COVID -19-hcp.pdf This test is not yet approved or cleared by the [place] and  has been authorized for detection and/or diagnosis of SARS-CoV-2 by FDA under an Emergency Use Authorization (EUA).  This EUA will remain in effec t (meaning this test can be  used) for the duration of  the COVID-19 declaration under Section 564(b)(1) of the Act, 21 U.S.C. section 360bbb-3(b)(1), unless the authorization is terminated or revoked sooner. Performed at [facility], [place] [address] [place], [place] [address]       Radiology Studies: No results found.  Dontre Laduca T. [facility] Pager [phone]  If 7PM-7AM, please contact night-coverage [email or web address] Password [facility] 10/12/2018, 7:50 AM

## 2018-10-12 NOTE — Procedures (Signed)
S/P T12 - L1  Fluoro  Guided disc aspiration .

## 2018-10-12 NOTE — Progress Notes (Signed)
    [facility] has been requested to perform a transesophageal echocardiogram on 06/12 for bacteremia.  After careful review of history and examination, the risks and benefits of transesophageal echocardiogram have been explained including risks of esophageal damage, perforation (1:10,000 risk), bleeding, pharyngeal hematoma as well as other potential complications associated with conscious sedation including aspiration, arrhythmia, respiratory failure and death. Alternatives to treatment were discussed, questions were answered. Patient is willing to proceed.   Rosaria Ferries, PA-C 10/12/2018 6:37 PM

## 2018-10-12 NOTE — H&P (View-Only) (Signed)
[facility] for Infectious Disease  Date of Admission:  10/10/2018      Total days of antibiotics 14  Vancomycin day 14         Patient ID: Henry Mcpherson is a 63 y.o. 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 acidophilus  1 capsule Oral Daily  . atorvastatin  40 mg Oral Daily  . bupivacaine      . digoxin  0.125 mg Oral Daily  . fentaNYL      . furosemide  40 mg Oral Daily  . insulin aspart  0-9 Units Subcutaneous TID WC  . metoprolol succinate  100 mg Oral BID  . midazolam      . nicotine  14 mg Transdermal Daily  . pantoprazole  40 mg Oral BID  . rifampin  300 mg Oral Q8H    SUBJECTIVE: Had T12-L1 fluoro guided disc aspiration with results pending. He will undergo TEE tomorrow for evaluation of his PPM leads - no mention of of leads on TTE and no large vegetations per that study.   Review of Systems: Review of Systems  Constitutional: Negative for chills and fever.  Gastrointestinal: Negative for abdominal pain, nausea and vomiting.  Musculoskeletal: Positive for back pain.  Skin: Negative for rash.  Neurological: Negative for weakness.    Allergies  Allergen Reactions  . Warfarin And Related Itching and Rash    OBJECTIVE: Vitals:   10/12/18 1050 10/12/18 1055 10/12/18 1100 10/12/18 1201  BP: 124/70 121/77 124/69 110/77   Pulse: 62 (!) 59 (!) 58 (!) 59  Resp: 14 16 20 16   Temp:    97.6 F (36.4 C)  TempSrc:    Oral  SpO2: 100% 100% 100% 100%  Weight:      Height:       Body mass index is 24.4 kg/m.  Physical Exam Constitutional:      Comments: Resting comfortably. Pain from procedure.   Cardiovascular:     Rate and Rhythm: Bradycardia present. Rhythm irregular.     Pulses: Normal pulses.     Comments: PPM again unremarkable Pulmonary:     Effort: Pulmonary effort is normal. No respiratory distress.  Abdominal:     Palpations: Abdomen is soft.     Tenderness: There is no abdominal tenderness.  Musculoskeletal:     Comments: L foot dressing clean and dry   Skin:    General: Skin is warm and dry.     Capillary Refill: Capillary refill takes less than 2 seconds.  Neurological:     Mental Status: He is alert and oriented to person, place, and time.     Lab Results Lab Results  Component Value Date   WBC 13.2 (H) 10/12/2018   HGB 8.2 (L) 10/12/2018   HCT  28.1 (L) 10/12/2018   MCV 87.5 10/12/2018   PLT 402 (H) 10/12/2018    Lab Results  Component Value Date   CREATININE 0.63 10/11/2018   BUN 15 10/11/2018   NA 140 10/11/2018   K 3.2 (L) 10/11/2018   CL 105 10/11/2018   CO2 27 10/11/2018    Lab Results  Component Value Date   ALT 50 (H) 07/13/2018   AST 31 07/13/2018   ALKPHOS 91 07/13/2018   BILITOT 0.5 07/13/2018     Microbiology: Recent Results (from the past 240 hour(s))  Culture, blood (Routine X 2) w Reflex to ID Panel     Status: None (Preliminary result)   Collection Time: 10/10/18  7:55 PM   Specimen: BLOOD  Result Value Ref Range Status   Specimen Description BLOOD LEFT ARM  Final   Special Requests   Final    BOTTLES DRAWN AEROBIC ONLY Blood Culture adequate volume   Culture   Final    NO GROWTH 2 DAYS Performed at [facility], [address] [place], [place] [address]    Report Status PENDING  Incomplete  Culture, blood (Routine X 2) w Reflex to ID  Panel     Status: None (Preliminary result)   Collection Time: 10/10/18  8:00 PM   Specimen: BLOOD  Result Value Ref Range Status   Specimen Description BLOOD LEFT ARM  Final   Special Requests   Final    BOTTLES DRAWN AEROBIC ONLY Blood Culture adequate volume   Culture   Final    NO GROWTH 2 DAYS Performed at [facility], [place] [address], [place], [place] [address]    Report Status PENDING  Incomplete  Surgical pcr screen     Status: None   Collection Time: 10/11/18 12:21 AM   Specimen: Nasal Mucosa; Nasal Swab  Result Value Ref Range Status   MRSA, PCR NEGATIVE NEGATIVE Final   Staphylococcus aureus NEGATIVE NEGATIVE Final    Comment: (NOTE) The Xpert SA Assay (FDA approved for NASAL specimens in patients 43 years of age and older), is one component of a comprehensive surveillance program. It is not intended to diagnose infection nor to guide or monitor treatment. Performed at [facility], [place] [address], [place], [place] [address]   SARS Coronavirus 2     Status: None   Collection Time: 10/11/18 12:23 AM  Result Value Ref Range Status   SARS Coronavirus 2 NOT DETECTED NOT DETECTED Final    Comment: (NOTE) SARS-CoV-2 target nucleic acids are NOT DETECTED. The SARS-CoV-2 RNA is generally detectable in upper and lower respiratory specimens during the acute phase of infection.  Negative  results do not preclude SARS-CoV-2 infection, do not rule out co-infections with other pathogens, and should not be used as the sole basis for treatment or other patient management decisions.  Negative results must be combined with clinical observations, patient history, and epidemiological information. The expected result is Not Detected. Fact Sheet for Patients: http://www.biofiredefense.com/wp-content/uploads/2020/03/BIOFIRE-COVID -19-patients.pdf Fact Sheet for Healthcare Providers: http://www.biofiredefense.com/wp-content/uploads/2020/03/BIOFIRE-COVID -19-hcp.pdf This  test is not yet approved or cleared by the [place] and  has been authorized for detection and/or diagnosis of SARS-CoV-2 by FDA under an Emergency Use Authorization (EUA).  This EUA will remain in effec t (meaning this test can be used) for the duration of  the COVID-19 declaration under Section 564(b)(1) of the Act, 21 U.S.C. section 360bbb-3(b)(1), unless the authorization is terminated or revoked sooner. Performed at [facility], [place] [address], [place], [place] [address]     Assessment & Plan:   1. MRSA Bacteremia = TTE unremarkable. TEE scheduled tomorrow to evaluate PPM wires and valves with greater sensitivity. Continue vancomycin therapy.   2. 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   3. Thoracolumbar Vertebral Discitis/Osteomyelitis with Hardware Loosening = CT guided vertebral biopsy obtained today for surgical planning. Baseline ESR 50, CRP 2.2. Conventional cultures along with fungal/AFB pending, however given MRSA bacteremia most likely due to same organism.   4. Infection of Left Great Toe = s/p removal 6/02 per OSH podiatry team. WOC recommended to increase to daily dressing changes.   5. Medication monitoring = creatinine stable on therapy.    Janene Madeira, MSN, NP-C [facility] for Infectious [place] Cell: [phone] Pager: [phone]  10/12/2018  2:24 PM

## 2018-10-12 NOTE — Anesthesia Preprocedure Evaluation (Addendum)
Anesthesia Evaluation  Patient identified by MRN, date of birth, ID band Patient awake    Reviewed: Allergy & Precautions, NPO status , Patient's Chart, lab work & pertinent test results  Airway Mallampati: II  TM Distance: >3 FB Neck ROM: Full    Dental no notable dental hx.    Pulmonary pneumonia, unresolved, COPD, former smoker,    Pulmonary exam normal breath sounds clear to auscultation + decreased breath sounds      Cardiovascular hypertension, + CAD, + Past MI, + Cardiac Stents, + CABG and +CHF  + dysrhythmias Atrial Fibrillation + pacemaker  Rhythm:Irregular Rate:Normal   The left ventricle has mildly reduced systolic function, with an ejection fraction of 45-50%. The cavity size was normal. There is mildly increased left ventricular wall thickness. Left ventricular diastology could not be evaluated secondary to  atrial fibrillation. Left ventricular diffuse hypokinesis.  2. Left atrial size was mildly dilated.  3. The mitral valve is degenerative. Mild calcification of the posterior mitral valve leaflet. There is moderate mitral annular calcification present.  4. The aortic valve is tricuspid Moderate sclerosis of the aortic valve.  5. The aortic root and ascending aorta are normal in size and structure.  6. The inferior vena cava was dilated in size with <50% respiratory variability.  SUMMARY   LVEF 45-50%, global hypokinesis, incoordinate septal motion, mild LVH, mild LAE, MAC with mild MR, mild TR, RV pacer or AICD wire noted, dilated IVC  FINDINGS  Left Ventricle: The left ventricle has mildly reduced systolic function, with an ejection fraction of 45-50%. The cavity size was normal. There is mildly increased left ventricular wall thickness. Left ventricular diastology could not be evaluated  secondary to atrial fibrillation. Left ventricular diffuse hypokinesis. Right Ventricle: The right ventricle was not well  visualized. The cavity was not assessed. There is right vetricular wall thickness was not assessed. Pacing wire/catheter visualized in the right    Neuro/Psych  Neuromuscular disease negative psych ROS   GI/Hepatic Neg liver ROS, GERD  ,  Endo/Other  diabetes  Renal/GU negative Renal ROS     Musculoskeletal  (+) Arthritis ,   Abdominal   Peds  Hematology  (+) anemia ,   Anesthesia Other Findings   Reproductive/Obstetrics                             Anesthesia Physical  Anesthesia Plan  ASA: IV  Anesthesia Plan: General   Post-op Pain Management:    Induction: Intravenous  PONV Risk Score and Plan: 2 and Ondansetron, Dexamethasone and Treatment may vary due to age or medical condition  Airway Management Planned: Oral ETT  Additional Equipment: Arterial line  Intra-op Plan:   Post-operative Plan: Possible Post-op intubation/ventilation  Informed Consent: I have reviewed the patients History and Physical, chart, labs and discussed the procedure including the risks, benefits and alternatives for the proposed anesthesia with the patient or authorized representative who has indicated his/her understanding and acceptance.     Dental advisory given  Plan Discussed with: CRNA and Surgeon  Anesthesia Plan Comments: (Severe anemia. No cardiac workup done despite patient being s/p pacemaker, s/p CABG 1992, and 2 stents within the past year. Will transfuse 2 units, get a cardiac assessment done,  and plan to hopefully continue on with surgery later today or tomorrow)        Anesthesia Quick Evaluation

## 2018-10-13 ENCOUNTER — Encounter (HOSPITAL_COMMUNITY)
Admission: AD | Disposition: A | Discharge status: Home Health | Payer: Self-pay | Source: Other Acute Inpatient Hospital | Attending: Student

## 2018-10-13 ENCOUNTER — Inpatient Hospital Stay (HOSPITAL_COMMUNITY): Payer: Medicare Other | Admitting: Anesthesiology

## 2018-10-13 ENCOUNTER — Encounter (HOSPITAL_COMMUNITY): Payer: Self-pay

## 2018-10-13 ENCOUNTER — Inpatient Hospital Stay (HOSPITAL_COMMUNITY): Payer: Medicare Other

## 2018-10-13 DIAGNOSIS — R7881 Bacteremia: Secondary | ICD-10-CM

## 2018-10-13 DIAGNOSIS — E118 Type 2 diabetes mellitus with unspecified complications: Secondary | ICD-10-CM

## 2018-10-13 DIAGNOSIS — I5032 Chronic diastolic (congestive) heart failure: Secondary | ICD-10-CM

## 2018-10-13 DIAGNOSIS — T827XXA Infection and inflammatory reaction due to other cardiac and vascular devices, implants and grafts, initial encounter: Principal | ICD-10-CM

## 2018-10-13 DIAGNOSIS — L89322 Pressure ulcer of left buttock, stage 2: Secondary | ICD-10-CM

## 2018-10-13 DIAGNOSIS — I340 Nonrheumatic mitral (valve) insufficiency: Secondary | ICD-10-CM

## 2018-10-13 DIAGNOSIS — M4626 Osteomyelitis of vertebra, lumbar region: Secondary | ICD-10-CM

## 2018-10-13 DIAGNOSIS — Z872 Personal history of diseases of the skin and subcutaneous tissue: Secondary | ICD-10-CM

## 2018-10-13 DIAGNOSIS — Z8739 Personal history of other diseases of the musculoskeletal system and connective tissue: Secondary | ICD-10-CM

## 2018-10-13 DIAGNOSIS — I330 Acute and subacute infective endocarditis: Secondary | ICD-10-CM

## 2018-10-13 HISTORY — PX: TEE WITHOUT CARDIOVERSION: SHX5443

## 2018-10-13 HISTORY — PX: BUBBLE STUDY: SHX6837

## 2018-10-13 LAB — BASIC METABOLIC PANEL
Anion gap: 10 (ref 5–15)
BUN: 19 mg/dL (ref 8–23)
CO2: 24 mmol/L (ref 22–32)
Calcium: 8 mg/dL — ABNORMAL LOW (ref 8.9–10.3)
Chloride: 104 mmol/L (ref 98–111)
Creatinine, Ser: 0.92 mg/dL (ref 0.61–1.24)
GFR calc Af Amer: >60 mL/min (ref >60)
GFR calc non Af Amer: >60 mL/min (ref >60)
Glucose, Bld: 174 mg/dL — ABNORMAL HIGH (ref 70–99)
Potassium: 3.3 mmol/L — ABNORMAL LOW (ref 3.5–5.1)
Sodium: 138 mmol/L (ref 135–145)

## 2018-10-13 LAB — VANCOMYCIN, PEAK: Vancomycin Pk: 25 ug/mL — ABNORMAL LOW (ref 30–40)

## 2018-10-13 LAB — LIPID PANEL
Cholesterol: 86 mg/dL (ref 0–200)
HDL: 22 mg/dL — ABNORMAL LOW (ref >40)
LDL Cholesterol: 41 mg/dL (ref 0–99)
Total CHOL/HDL Ratio: 3.9 RATIO
Triglycerides: 113 mg/dL (ref <150)
VLDL: 23 mg/dL (ref 0–40)

## 2018-10-13 LAB — GLUCOSE, CAPILLARY
Glucose-Capillary: 117 mg/dL — ABNORMAL HIGH (ref 70–99)
Glucose-Capillary: 116 mg/dL — ABNORMAL HIGH (ref 70–99)
Glucose-Capillary: 131 mg/dL — ABNORMAL HIGH (ref 70–99)
Glucose-Capillary: 164 mg/dL — ABNORMAL HIGH (ref 70–99)

## 2018-10-13 LAB — ACID FAST SMEAR (AFB, MYCOBACTERIA): Acid Fast Smear: NEGATIVE

## 2018-10-13 LAB — VANCOMYCIN, TROUGH: Vancomycin Tr: 16 ug/mL (ref 15–20)

## 2018-10-13 LAB — HEMOGLOBIN A1C
Hgb A1c MFr Bld: 6.2 % — ABNORMAL HIGH (ref 4.8–5.6)
Mean Plasma Glucose: 131.24 mg/dL

## 2018-10-13 SURGERY — ECHOCARDIOGRAM, TRANSESOPHAGEAL
Anesthesia: General

## 2018-10-13 MED ORDER — SODIUM CHLORIDE 0.9 % IV SOLN
INTRAVENOUS | Status: DC | PRN
  Administered 2018-10-13: 08:00:00 via INTRAVENOUS

## 2018-10-13 MED ORDER — PROPOFOL 500 MG/50ML IV EMUL
INTRAVENOUS | Status: DC | PRN
  Administered 2018-10-13: 100 ug/kg/min via INTRAVENOUS

## 2018-10-13 MED ORDER — LIDOCAINE 2% (20 MG/ML) 5 ML SYRINGE
INTRAMUSCULAR | Status: DC | PRN
  Administered 2018-10-13: 80 mg via INTRAVENOUS

## 2018-10-13 MED ORDER — GENERIC EXTERNAL MEDICATION
Status: DC
Start: ? — End: 2018-10-13

## 2018-10-13 MED ORDER — BUTAMBEN-TETRACAINE-BENZOCAINE 2-2-14 % EX AERO
INHALATION_SPRAY | CUTANEOUS | Status: DC | PRN
  Administered 2018-10-13: 08:00:00 2 via TOPICAL

## 2018-10-13 NOTE — Progress Notes (Signed)
[facility]  Date of Admission:  10/10/2018      Total days of antibiotics 15  Vancomycin day 15         Patient ID: Henry Mcpherson is a 63 y.o. 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 acidophilus  1 capsule Oral Daily  . atorvastatin  40 mg Oral Daily  . digoxin  0.125 mg Oral Daily  . furosemide  40 mg Oral Daily  . insulin aspart  0-9 Units Subcutaneous TID WC  . metoprolol succinate  100 mg Oral BID  . nicotine  14 mg Transdermal Daily  . pantoprazole  40 mg Oral BID  . rifampin  300 mg Oral Q8H    SUBJECTIVE: Just back from TEE - reviewed results and answered recommendations for treatment and PICC line therapy.   No new complaints otherwise.   Review of Systems: Review of Systems  Constitutional: Negative for chills and fever.  Respiratory: Negative for cough and shortness of breath.   Cardiovascular: Negative for chest pain and palpitations.  Gastrointestinal: Negative for abdominal pain, nausea and vomiting.  Musculoskeletal: Positive for back pain.  Skin: Negative for rash.  Neurological: Negative for weakness.    Allergies  Allergen Reactions  . Warfarin And Related Itching and Rash    OBJECTIVE: Vitals:   10/13/18 0744 10/13/18 0852 10/13/18 0900 10/13/18 0910  BP: (!) 130/57 (!) 114/58  133/68 (!) 117/59  Pulse: 60 60 (!) 59 (!) 59  Resp: 18 16 14 19   Temp: 97.8 F (36.6 C) 98.3 F (36.8 C)    TempSrc: Temporal Other (Comment)    SpO2: 100% 100% 99% 100%  Weight:      Height:       Body mass index is 24.09 kg/m.  Physical Exam Vitals signs and nursing note reviewed.  Constitutional:      Comments: Resting comfortably in bed  Cardiovascular:     Rate and Rhythm: Bradycardia present. Rhythm irregular.     Pulses: Normal pulses.     Comments: PPM again unremarkable Pulmonary:     Effort: Pulmonary effort is normal. No respiratory distress.  Abdominal:     Palpations: Abdomen is soft.     Tenderness: There is no abdominal tenderness.  Musculoskeletal:     Comments: L foot dressing clean and dry   Skin:    General: Skin is warm and dry.     Capillary Refill: Capillary refill takes less than 2 seconds.  Neurological:     Mental Status: He is alert and oriented to person, place, and time.     Lab Results Lab Results  Component Value Date   WBC 13.2 (H) 10/12/2018   HGB 8.2 (L) 10/12/2018   HCT 28.1 (L) 10/12/2018  MCV 87.5 10/12/2018   PLT 402 (H) 10/12/2018    Lab Results  Component Value Date   CREATININE 0.63 10/11/2018   BUN 15 10/11/2018   NA 140 10/11/2018   K 3.2 (L) 10/11/2018   CL 105 10/11/2018   CO2 27 10/11/2018    Lab Results  Component Value Date   ALT 50 (H) 07/13/2018   AST 31 07/13/2018   ALKPHOS 91 07/13/2018   BILITOT 0.5 07/13/2018     Microbiology: Recent Results (from the past 240 hour(s))  Culture, blood (Routine X 2) w Reflex to ID Panel     Status: None (Preliminary result)   Collection Time: 10/10/18  7:55 PM   Specimen: BLOOD  Result Value Ref Range Status   Specimen Description BLOOD LEFT ARM  Final   Special Requests   Final    BOTTLES DRAWN AEROBIC ONLY Blood Culture adequate volume   Culture   Final    NO GROWTH 3 DAYS Performed at [facility], [address], [place], [place] [address]     Report Status PENDING  Incomplete  Culture, blood (Routine X 2) w Reflex to ID Panel     Status: None (Preliminary result)   Collection Time: 10/10/18  8:00 PM   Specimen: BLOOD  Result Value Ref Range Status   Specimen Description BLOOD LEFT ARM  Final   Special Requests   Final    BOTTLES DRAWN AEROBIC ONLY Blood Culture adequate volume   Culture   Final    NO GROWTH 3 DAYS Performed at [facility], [place] [address] [place], [place] [address]    Report Status PENDING  Incomplete  Surgical pcr screen     Status: None   Collection Time: 10/11/18 12:21 AM   Specimen: Nasal Mucosa; Nasal Swab  Result Value Ref Range Status   MRSA, PCR NEGATIVE NEGATIVE Final   Staphylococcus aureus NEGATIVE NEGATIVE Final    Comment: (NOTE) The Xpert SA Assay (FDA approved for NASAL specimens in patients 42 years of age and older), is one component of a comprehensive surveillance program. It is not intended to diagnose infection nor to guide or monitor treatment. Performed at [facility], [place] [address], [place],  [address]   SARS Coronavirus 2     Status: None   Collection Time: 10/11/18 12:23 AM  Result Value Ref Range Status   SARS Coronavirus 2 NOT DETECTED NOT DETECTED Final    Comment: (NOTE) SARS-CoV-2 target nucleic acids are NOT DETECTED. The SARS-CoV-2 RNA is generally detectable in upper and lower respiratory specimens during the acute phase of infection.  Negative  results do not preclude SARS-CoV-2 infection, do not rule out co-infections with other pathogens, and should not be used as the sole basis for treatment or other patient management decisions.  Negative results must be combined with clinical observations, patient history, and epidemiological information. The expected result is Not Detected. Fact Sheet for Patients: http://www.biofiredefense.com/wp-content/uploads/2020/03/BIOFIRE-COVID -19-patients.pdf Fact Sheet for Healthcare Providers:  http://www.biofiredefense.com/wp-content/uploads/2020/03/BIOFIRE-COVID -19-hcp.pdf This test is not yet approved or cleared by the [place] and  has been authorized for detection and/or diagnosis of SARS-CoV-2 by FDA under an Emergency Use Authorization (EUA).  This EUA will remain in effec t (meaning this test can be used) for the duration of  the COVID-19 declaration under Section 564(b)(1) of the Act, 21 U.S.C. section 360bbb-3(b)(1), unless the authorization is terminated or revoked sooner. Performed at [facility], [place]  [address] [place], [place] [address]   Body fluid culture     Status: None (Preliminary result)   Collection Time: 10/12/18 11:15 AM   Specimen: Fluid; Wound  Result Value Ref Range Status   Specimen Description FLUID  Final   Special Requests NONE  Final   Gram Stain   Final    RARE WBC PRESENT, PREDOMINANTLY PMN NO ORGANISMS SEEN    Culture   Final    NO GROWTH < 24 HOURS Performed at [facility] [address] [place], [place] [address]    Report Status PENDING  Incomplete    Assessment & Plan:   1. MRSA Bacteremia = original source likely chronically infected great toe that has since been removed. Unfortunately during bacteremia it has been realized he has seeded his PPM (TEE indicating vegetation vs thrombus on lead today) and evidence of vertebral infection with HW as described below. Continue Vancomycin + rifampin   2. 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 Please consult EP for consideration of removing pacemaker system. Uncertain how dependent he is on current device but may require temporary pacing wires.   3. 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 Baseline ESR 50, CRP 2.2. Conventional cultures along with fungal/AFB pending, however given MRSA  bacteremia most likely due to same organism.   4. Infection of Left Great Toe = s/p removal 6/02 per OSH podiatry team. WOC recommended to increase to daily dressing changes.   5. Medication monitoring = creatinine stable on therapy.    Would hold off on PICC line until EP sees him - he may need temp pacer with system removal.    Janene Madeira, MSN, NP-C [facility] [place] Cell: [phone] Pager: [phone]  10/13/2018  12:01 PM

## 2018-10-13 NOTE — Addendum Note (Signed)
Addendum  created 10/13/18 1010 by Nora Rooke, Dahlia Bailiff, CRNA   Intraprocedure Flowsheets edited

## 2018-10-13 NOTE — CV Procedure (Signed)
INDICATIONS: infective endocarditis  PROCEDURE:   Informed consent was obtained prior to the procedure. The risks, benefits and alternatives for the procedure were discussed and the patient comprehended these risks.  Risks include, but are not limited to, cough, sore throat, vomiting, nausea, somnolence, esophageal and stomach trauma or perforation, bleeding, low blood pressure, aspiration, pneumonia, infection, trauma to the teeth and death.    After a procedural time-out, the oropharynx was anesthetized with 20% benzocaine spray.   During this procedure the patient was administered propofol per anesthesia (MAC).  The patient's heart rate, blood pressure, and oxygen saturationweare monitored continuously during the procedure. The period of conscious sedation was 22 minutes, of which I was present face-to-face 100% of this time.  The transesophageal probe was inserted in the esophagus without difficulty and multiple views were obtained. Views from stomach not obtained due to slight resistance to passing the probe and no strong indication for transgastric images.  The patient was kept under observation until the patient left the procedure room.  The patient left the procedure room in stable condition.   Agitated microbubble saline contrast was administered.  COMPLICATIONS:    There were no immediate complications.  FINDINGS:  Vegetation vs thrombus on pacemaker lead. No valvular vegetations.  Moderate mitral regurgitation with systolic blunting of pulmonary vein Doppler.  Mild tricuspid regurgitation.   Aortic atherosclerosis No PFO. Valsalva not performed due to RA vegatation/thrombus on lead.    RECOMMENDATIONS:    Ok to return to hospital room when alert.   Time Spent Directly with the Patient:  45 minutes   Henry Mcpherson 10/13/2018, 8:46 AM

## 2018-10-13 NOTE — Interval H&P Note (Signed)
History and Physical Interval Note:  10/13/2018 8:08 AM  Henry Mcpherson  has presented today for surgery, with the diagnosis of bacteremia, possible decive infection.  The various methods of treatment have been discussed with the patient and family. After consideration of risks, benefits and other options for treatment, the patient has consented to  Procedure(s): TRANSESOPHAGEAL ECHOCARDIOGRAM (TEE) (N/A) as a surgical intervention.  The patient's history has been reviewed, patient examined, no change in status, stable for surgery.  I have reviewed the patient's chart and labs.  Questions were answered to the patient's satisfaction.     Elouise Munroe

## 2018-10-13 NOTE — Progress Notes (Signed)
PROGRESS NOTE  Henry Mcpherson IWL:[id number] DOB: [birth year] DOA: 10/10/2018 PCP: System, Pcp Not In   LOS: 3 days   Patient is from: Home  Brief Narrative / Interim history: 63 y.o. male with medical history significant ofsCHF with EF 45%, A. fib, tachybradycardia syndromes/p PM,COPD, DM type II, PUD, lumbar stenosis, and cauda equina syndrome, s/plumbar decompression and fusion by Dr.Cram on 3/4; s/p left great toe amputation due to infection, who is transferred from [facility] due to back pain, bilateral leg weakness, foot infection, MRSA bacteremia.  Patient had surgical resection of left foot by podiatry at OSH.  On day 11 of vancomycin on day of transfer.  Repeat blood culture reportedly negative.  He states that he continues to have back pain, bilateral leg weakness, difficulty walking.  He states that he has decreased sensation in both legs which he attributes to diabetic neuropathy.  Denies new bowel or bladder issues other than usual poor bowel control.  Reportedly had lumbar X-ray 3 weeks ago, and finding was concerning for possible infection as per Dr. Windy Carina review.  On arrival here, hemodynamically stable.  Hemoglobin 7.8 (about baseline).  Electrolytes not impressive.  COVID-19 negative.  Per admitting provider, Dr. Ellene Route from neurosurgery consulted on arrival.  CT lumbar spine showed superior migration of left L4 screw, loosening of left L3 screw, discitis at T12-L1 with regional osteomyelitis and possible discitis at L3-4 and L5-S1.  Patient had IR fluoroscopy guided biopsy of the thoracolumbar discitis on 10/12/2018.  Cultures were sent.  He also had TTE which did not show vegetation.  Underwent TEE on 10/13/2018  Subjective: No major events overnight of this morning.  No complaint this morning.  Denies chest pain, dyspnea, abdominal pain or focal neuro deficit.  Denies bowel or bladder issue.  Assessment & Plan: MRSA bacteremia with T12-L1 discitis and  regional osteomyelitis:  ESR and CRP 50/1.5 respectively. -Repeat blood culture at OSH reportedly negative  -Repeat blood culture on 10/11/2018 here negative so far. -IR fluoroscopy guided biopsy of the discitis on 10/12/2018-follow cultures. -Infectious disease consulted-appreciate input -Continue IV vancomycin-started at OSH 11 days prior to transfer -Rifampin added per ID on 6/10 -TTE with normal EF and no visible vegetation. -TEE results pending. -Pacemaker not MRI compatible per cardiology.  History of cauda equina syndrome and spinal stenosis of lumbar region: s/p of lumbar decompression and fusion by Dr. Saintclair Halsted on 3/4.   -CT lumbar spine concerning for loosening of the screws, discitis and osteomyelitis as above -IR fluoroscopy guided biopsy of the discitis on 10/12/2018-follow cultures. -Appreciate neurosurgery recommendation -Antibiotic as above -PRN Percocet and Flexeril abdominal for pain.  Left foot infection-great toe: s/p of amputation by podiatry at outside hospital.  -Antibiotic as above -Appreciate wound care input  Chronic obstructive lung disease ([place]): stable. 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  A1c 6.2.  CBG within appropriate range. -SSI -Levemir on hold due to mild hypoglycemia  Essential hypertension: Normotensive -IV Hydralazine prn -Continue home metoprolol  Chronic diastolic CHF:  TTE on 1/74 with EF of 55 to 60% (improved), moderate LVH, indeterminate DD, moderately reduced RVSP no vegetation.  On 07/05/2018 showed EF 45-50%.  No cardiopulmonary symptoms.  No signs of fluid overload except for bilateral lower extremity edema likely chronic.  -Continue home oral Lasix 40 mg daily -Daily weight, intake output and renal function -Monitor electrolytes and replenish  Permanent atrial Fibrillation:  Rate controlled with metoprolol.  CHA2DS2-VASc Score  is 5 but not on AC.  Now he has possible GIB with stool guaiac positive.  -continue metoprolol and digoxin  CAD (coronary artery disease): s/p of stent and CABG. No Cp. -continue lipitor and metoprolol  Chronic blood loss anemia and anemia of chronic disease:  FOBT positive.  Hb 8.2 (about baseline).  -protonix 40 bid -Monitor H&H  Stage II pressure injury over his left buttock: POA -Appreciate wound care input.  Scheduled Meds: . acidophilus  1 capsule Oral Daily  . atorvastatin  40 mg Oral Daily  . digoxin  0.125 mg Oral Daily  . furosemide  40 mg Oral Daily  . insulin aspart  0-9 Units Subcutaneous TID WC  . metoprolol succinate  100 mg Oral BID  . nicotine  14 mg Transdermal Daily  . pantoprazole  40 mg Oral BID  . rifampin  300 mg Oral Q8H   Continuous Infusions: . sodium chloride Stopped (10/11/18 0249)  . vancomycin 1,000 mg (10/12/18 2130)   PRN Meds:.sodium chloride, acetaminophen, albuterol, cyclobenzaprine, hydrALAZINE, ondansetron (ZOFRAN) IV, oxyCODONE-acetaminophen   DVT prophylaxis: SCD given chronic GI bleed/FOBT positive Code Status: Full code Family Communication: Updated patient's wife over the phone. Disposition Plan: Remains inpatient for MRSA bacteremia, discitis, osteomyelitis and diabetic left foot wound  Consultants:   Neurosurgery  Infectious disease  Cardiology  IR  Procedures:   IR fluoroscopy guided biopsy of thoracolumbar discitis on 6/11  Microbiology: . Initial blood culture at outside hospital reportedly MRSA . Repeat blood cultures outside hospital reportedly negative . Blood culture on 10/10/2018-negative so far . MRSA PCR negative . COVID-19 negative . Tissue cultures pending.  Antimicrobials: Anti-infectives (From admission, onward)   Start     Dose/Rate Route Frequency Ordered Stop   10/11/18 1400  rifampin (RIFADIN) capsule 300 mg     300 mg Oral Every 8 hours 10/11/18 1100     10/10/18 2130  vancomycin (VANCOCIN)  IVPB 1000 mg/200 mL premix     1,000 mg 200 mL/hr over 60 Minutes Intravenous Every 24 hours 10/10/18 2126        Objective: Vitals:   10/13/18 0744 10/13/18 0852 10/13/18 0900 10/13/18 0910  BP: (!) 130/57 (!) 114/58 133/68 (!) 117/59  Pulse: 60 60 (!) 59 (!) 59  Resp: 18 16 14 19   Temp: 97.8 F (36.6 C) 98.3 F (36.8 C)    TempSrc: Temporal Other (Comment)    SpO2: 100% 100% 99% 100%  Weight:      Height:        Intake/Output Summary (Last 24 hours) at 10/13/2018 1035 Last data filed at 10/13/2018 0851 Gross per 24 hour  Intake 1871.11 ml  Output 1450 ml  Net 421.11 ml   Filed Weights   10/10/18 2000 10/11/18 2103 10/12/18 1925  Weight: 86.4 kg 86.2 kg 85.1 kg    Examination:.  GENERAL: No acute distress.  Appears well.  HEENT: MMM.  Vision and hearing grossly intact.  NECK: Supple.  No apparent JVD.Marland Kitchen  LUNGS:  No IWOB. Good air movement bilaterally. HEART:  RRR. Heart sounds normal.  ABD: Bowel sounds present. Soft. Non tender.  MSK/EXT: Bilateral lower extremity weakness.  2+ pitting edema bilaterally. SKIN: Pressure skin injury over right buttock.  See picture below for more. NEURO: Awake, alert and oriented appropriately.  Motor strength/5 in both lower extremities.  Light sensation diminished but symmetric.  Plantar reflex symmetric. PSYCH: Calm. Normal affect.     Data Reviewed:  I have independently reviewed following labs and imaging studies  CBC: Recent Labs  Lab 10/10/18 2004 10/11/18 0611 10/12/18 0552  WBC 15.8* 11.2* 13.2*  HGB 9.0* 8.4* 8.2*  HCT 30.0* 27.8* 28.1*  MCV 86.7 86.9 87.5  PLT 379 342 956*   Basic Metabolic Panel: Recent Labs  Lab 10/10/18 2004 10/10/18 2028 10/11/18 0611  NA 141  --  140  K 3.4*  --  3.2*  CL 108  --  105  CO2 27  --  27  GLUCOSE 146*  --  128*  BUN 15  --  15  CREATININE 0.69 0.69 0.63  CALCIUM 8.6*  --  8.5*   GFR: Estimated Creatinine Clearance: 107 mL/min (by C-G formula based on SCr of 0.63  mg/dL). Liver Function Tests: No results for input(s): AST, ALT, ALKPHOS, BILITOT, PROT, ALBUMIN in the last 168 hours. No results for input(s): LIPASE, AMYLASE in the last 168 hours. No results for input(s): AMMONIA in the last 168 hours. Coagulation Profile: Recent Labs  Lab 10/11/18 0611  INR 1.2   Cardiac Enzymes: No results for input(s): CKTOTAL, CKMB, CKMBINDEX, TROPONINI in the last 168 hours. BNP (last 3 results) No results for input(s): PROBNP in the last 8760 hours. HbA1C: Recent Labs    10/13/18 0528  HGBA1C 6.2*   CBG: Recent Labs  Lab 10/12/18 0704 10/12/18 0849 10/12/18 1128 10/12/18 1612 10/13/18 0644  GLUCAP 67* 140* 110* 132* 117*   Lipid Profile: Recent Labs    10/13/18 0528  CHOL 86  HDL 22*  LDLCALC 41  TRIG 113  CHOLHDL 3.9   Thyroid Function Tests: No results for input(s): TSH, T4TOTAL, FREET4, T3FREE, THYROIDAB in the last 72 hours. Anemia Panel: No results for input(s): VITAMINB12, FOLATE, FERRITIN, TIBC, IRON, RETICCTPCT in the last 72 hours. Urine analysis:    Component Value Date/Time   COLORURINE YELLOW 07/08/2018 0224   APPEARANCEUR CLEAR 07/08/2018 0224   LABSPEC 1.011 07/08/2018 0224   PHURINE 6.0 07/08/2018 0224   GLUCOSEU NEGATIVE 07/08/2018 0224   HGBUR SMALL (A) 07/08/2018 0224   BILIRUBINUR NEGATIVE 07/08/2018 0224   KETONESUR NEGATIVE 07/08/2018 0224   PROTEINUR NEGATIVE 07/08/2018 0224   NITRITE NEGATIVE 07/08/2018 0224   LEUKOCYTESUR SMALL (A) 07/08/2018 0224   Sepsis Labs: Invalid input(s): PROCALCITONIN, LACTICIDVEN  Recent Results (from the past 240 hour(s))  Culture, blood (Routine X 2) w Reflex to ID Panel     Status: None (Preliminary result)   Collection Time: 10/10/18  7:55 PM   Specimen: BLOOD  Result Value Ref Range Status   Specimen Description BLOOD LEFT ARM  Final   Special Requests   Final    BOTTLES DRAWN AEROBIC ONLY Blood Culture adequate volume   Culture   Final    NO GROWTH 3 DAYS  Performed at [facility], [address], [place], [place] [address]    Report Status PENDING  Incomplete  Culture, blood (Routine X 2) w Reflex to ID Panel     Status: None (Preliminary result)   Collection Time: 10/10/18  8:00 PM   Specimen: BLOOD  Result Value Ref Range Status   Specimen Description BLOOD LEFT ARM  Final   Special Requests   Final    BOTTLES DRAWN AEROBIC ONLY Blood Culture adequate volume   Culture   Final    NO GROWTH 3 DAYS Performed at [facility], [place] [address], [place], [place] [address]    Report Status PENDING  Incomplete  Surgical pcr screen     Status: None   Collection Time: 10/11/18 12:21 AM   Specimen: Nasal Mucosa; Nasal Swab  Result Value Ref Range Status   MRSA, PCR NEGATIVE NEGATIVE Final   Staphylococcus aureus NEGATIVE NEGATIVE Final    Comment: (NOTE) The Xpert SA Assay (FDA approved for NASAL specimens in patients 30 years of age and older), is one component of a comprehensive surveillance program. It is not intended to diagnose infection nor to guide or monitor treatment. Performed at [facility], [place] [address], [place], [place] [address]   SARS Coronavirus 2     Status: None   Collection Time: 10/11/18 12:23 AM  Result Value Ref Range Status   SARS Coronavirus 2 NOT DETECTED NOT DETECTED Final    Comment: (NOTE) SARS-CoV-2 target nucleic acids are NOT DETECTED. The SARS-CoV-2 RNA is generally detectable in upper and lower respiratory specimens during the acute phase of infection.  Negative  results do not preclude SARS-CoV-2 infection, do not rule out co-infections with other pathogens, and should not be used as the sole basis for treatment or other patient management decisions.  Negative results must be combined with clinical observations, patient history, and epidemiological information. The expected result is Not Detected. Fact Sheet for Patients:  http://www.biofiredefense.com/wp-content/uploads/2020/03/BIOFIRE-COVID -19-patients.pdf Fact Sheet for Healthcare Providers: http://www.biofiredefense.com/wp-content/uploads/2020/03/BIOFIRE-COVID -19-hcp.pdf This test is not yet approved or cleared by the [place] and  has been authorized for detection and/or diagnosis of SARS-CoV-2 by FDA under an Emergency Use Authorization (EUA).  This EUA will remain in effec t (meaning this test can be used) for the duration of  the COVID-19 declaration under Section 564(b)(1) of the Act, 21 U.S.C. section 360bbb-3(b)(1), unless the authorization is terminated or revoked sooner. Performed at [facility], [place] [address], [place], [place] [address]   Body fluid culture     Status: None (Preliminary result)   Collection Time: 10/12/18 11:15 AM   Specimen: Fluid; Wound  Result Value Ref Range Status   Specimen Description FLUID  Final   Special Requests NONE  Final   Gram Stain   Final    RARE WBC PRESENT, PREDOMINANTLY PMN NO ORGANISMS SEEN    Culture   Final    NO GROWTH < 24 HOURS Performed at [facility] [address] [place], [place] [address]    Report Status PENDING  Incomplete      Radiology Studies: No results found.  Gilliam Hawkes T[facility] Pager [phone]  If 7PM-7AM, please contact night-coverage [email or web address] Password [facility] 10/13/2018, 10:35 AM

## 2018-10-13 NOTE — Plan of Care (Signed)
  Problem: Education: Goal: Knowledge of General Education information will improve Description: Including pain rating scale, medication(s)/side effects and non-pharmacologic comfort measures Outcome: Adequate for Discharge   Problem: Health Behavior/Discharge Planning: Goal: Ability to manage health-related needs will improve Outcome: Adequate for Discharge   Problem: Clinical Measurements: Goal: Ability to maintain clinical measurements within normal limits will improve Outcome: Adequate for Discharge Goal: Diagnostic test results will improve Outcome: Adequate for Discharge Goal: Respiratory complications will improve Outcome: Adequate for Discharge   Problem: Nutrition: Goal: Adequate nutrition will be maintained Outcome: Adequate for Discharge   Problem: Coping: Goal: Level of anxiety will decrease Outcome: Adequate for Discharge   Problem: Safety: Goal: Ability to remain free from injury will improve Outcome: Adequate for Discharge

## 2018-10-13 NOTE — Progress Notes (Signed)
  Echocardiogram Echocardiogram Transesophageal has been performed.  Henry Mcpherson 10/13/2018, 8:49 AM

## 2018-10-13 NOTE — Transfer of Care (Signed)
Immediate Anesthesia Transfer of Care Note  Patient: Henry Mcpherson  Procedure(s) Performed: TRANSESOPHAGEAL ECHOCARDIOGRAM (TEE) (N/A ) BUBBLE STUDY  Patient Location: Endoscopy Unit  Anesthesia Type:MAC  Level of Consciousness: awake, alert , oriented, patient cooperative and responds to stimulation  Airway & Oxygen Therapy: Patient Spontanous Breathing and Patient connected to nasal cannula oxygen  Post-op Assessment: Report given to RN and Post -op Vital signs reviewed and stable  Post vital signs: Reviewed and stable  Last Vitals:  Vitals Value Taken Time  BP    Temp    Pulse 60 10/13/18 0851  Resp 16 10/13/18 0851  SpO2 100 % 10/13/18 0851  Vitals shown include unvalidated device data.  Last Pain:  Vitals:   10/13/18 0744  TempSrc: Temporal  PainSc: 7       Patients Stated Pain Goal: 2 (86/16/83 7290)  Complications: No apparent anesthesia complications

## 2018-10-13 NOTE — Progress Notes (Deleted)
  Echocardiogram 2D Echocardiogram has been performed.  Henry Mcpherson 10/13/2018, 8:48 AM

## 2018-10-13 NOTE — Anesthesia Postprocedure Evaluation (Signed)
Anesthesia Post Note  Patient: Henry Mcpherson  Procedure(s) Performed: TRANSESOPHAGEAL ECHOCARDIOGRAM (TEE) (N/A ) BUBBLE STUDY     Patient location during evaluation: PACU Anesthesia Type: MAC Level of consciousness: awake and alert Pain management: pain level controlled Vital Signs Assessment: post-procedure vital signs reviewed and stable Respiratory status: spontaneous breathing, nonlabored ventilation, respiratory function stable and patient connected to nasal cannula oxygen Cardiovascular status: stable and blood pressure returned to baseline Postop Assessment: no apparent nausea or vomiting Anesthetic complications: no    Last Vitals:  Vitals:   10/13/18 0744 10/13/18 0852  BP: (!) 130/57 (!) 114/58  Pulse: 60 60  Resp: 18 16  Temp: 36.6 C 36.8 C  SpO2: 100% 100%    Last Pain:  Vitals:   10/13/18 0854  TempSrc:   PainSc: 0-No pain                 [name] S

## 2018-10-13 NOTE — Progress Notes (Signed)
Pharmacy Antibiotic Note  Henry Mcpherson is a 63 y.o. male transferred OSH to [facility] on 10/10/2018. 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 Pharmacy has been consulted to resume Vancomycin dosing.  Repeat blood cultures negative thus far. PPM noted to have likely vegetation, rifampin added. SCr is up slightly 0.63 to 0.92 mg/dl. Vancomycin levels reveal therapeutic AUC of 489 mcg*h/ml.   Plan: -Continue vancomycin 1000mg  IV q24h for now -Watch Cr closely, if it rises further may need to consider empirically reducing dose slightly or rechecking vancomycin levels sooner    Height: 6\' 2"  (188 cm) Weight: 187 lb 9.8 oz (85.1 kg) IBW/kg (Calculated) : 82.2  Temp (24hrs), Avg:98 F (36.7 C), Min:97.7 F (36.5 C), Max:98.3 F (36.8 C)  Recent Labs  Lab 10/10/18 2004 10/10/18 2028 10/11/18 0611 10/12/18 0552 10/12/18 2344  WBC 15.8*  --  11.2* 13.2*  --   CREATININE 0.69 0.69 0.63  --   --   VANCOPEAK  --   --   --   --  25*  VANCORANDOM  --  17  --   --   --     Estimated Creatinine Clearance: 107 mL/min (by C-G formula based on SCr of 0.63 mg/dL).    Allergies  Allergen Reactions  . Warfarin And Related Itching and Rash    Antimicrobials this admission: Vanc OSH (started 5/30?) >>  Dose adjustments this admission: 10/13/18: Vp = 25, Vt = 16, AUC = 489 > no adjustments  Microbiology results: OSH BCx >> 2/2 MRSA 6/9 BCx >> NGTD  Thank you for allowing pharmacy to be a part of this patient's care.   Arrie Senate, PharmD, BCPS Clinical Pharmacist [phone] Please check AMION for all [place] numbers 10/13/2018

## 2018-10-13 NOTE — Progress Notes (Signed)
Patient's wife called, answered all her questions and updated the status.  She wanted to talk to doctor regarding plan of care, notified the Dr. Cyndia Skeeters.

## 2018-10-14 ENCOUNTER — Encounter (HOSPITAL_COMMUNITY): Payer: Self-pay | Admitting: *Deleted

## 2018-10-14 DIAGNOSIS — R001 Bradycardia, unspecified: Secondary | ICD-10-CM

## 2018-10-14 LAB — CBC
HCT: 26.3 % — ABNORMAL LOW (ref 39.0–52.0)
Hemoglobin: 8 g/dL — ABNORMAL LOW (ref 13.0–17.0)
MCH: 26.1 pg (ref 26.0–34.0)
MCHC: 30.4 g/dL (ref 30.0–36.0)
MCV: 85.9 fL (ref 80.0–100.0)
Platelets: 350 10*3/uL (ref 150–400)
RBC: 3.06 MIL/uL — ABNORMAL LOW (ref 4.22–5.81)
RDW: 17.6 % — ABNORMAL HIGH (ref 11.5–15.5)
WBC: 11.6 10*3/uL — ABNORMAL HIGH (ref 4.0–10.5)
nRBC: 0 % (ref 0.0–0.2)

## 2018-10-14 LAB — BASIC METABOLIC PANEL
Anion gap: 8 (ref 5–15)
BUN: 18 mg/dL (ref 8–23)
CO2: 25 mmol/L (ref 22–32)
Calcium: 8.1 mg/dL — ABNORMAL LOW (ref 8.9–10.3)
Chloride: 104 mmol/L (ref 98–111)
Creatinine, Ser: 0.69 mg/dL (ref 0.61–1.24)
GFR calc Af Amer: >60 mL/min (ref >60)
GFR calc non Af Amer: >60 mL/min (ref >60)
Glucose, Bld: 146 mg/dL — ABNORMAL HIGH (ref 70–99)
Potassium: 3.1 mmol/L — ABNORMAL LOW (ref 3.5–5.1)
Sodium: 137 mmol/L (ref 135–145)

## 2018-10-14 LAB — GLUCOSE, CAPILLARY
Glucose-Capillary: 145 mg/dL — ABNORMAL HIGH (ref 70–99)
Glucose-Capillary: 137 mg/dL — ABNORMAL HIGH (ref 70–99)
Glucose-Capillary: 164 mg/dL — ABNORMAL HIGH (ref 70–99)
Glucose-Capillary: 151 mg/dL — ABNORMAL HIGH (ref 70–99)
Glucose-Capillary: 134 mg/dL — ABNORMAL HIGH (ref 70–99)

## 2018-10-14 MED ORDER — POTASSIUM CHLORIDE CRYS ER 20 MEQ PO TBCR
40.0000 meq | EXTENDED_RELEASE_TABLET | ORAL | Status: AC
  Administered 2018-10-14 (×2): 40 meq via ORAL
  Filled 2018-10-14 (×2): qty 2

## 2018-10-14 NOTE — Consult Note (Signed)
ELECTROPHYSIOLOGY CONSULT NOTE    Primary Care Physician: System, Pcp Not In Referring Physician:    Dr Linus Salmons  Admit Date: 10/10/2018  Reason for consultation:   bacteremia  Henry Mcpherson is a 63 y.o. male with a h/o multiple chronic medical issues including permanent afib, prior PPM implant in 2010, OSA, DM, and recent diffculty with infection/ osteomyelitis who now presents with MRSA bacteremia. He reports having slow, steady decline for about 2 years.  During this time, he has had several infections, including infection of toes on both feet.  His wife reports that she thinks that he may have had prior bacteremia about a year ago with antibiotic therapy delivered, complicated by CDiff at that time. This admission, he has been found to have MRSA bacteremia with toe infection requiring amputation.  Of note, he is s/p back surgery several months ago.  He has loosening of the L L3 screw with further endplate destruction of T12-L1 concerning for discitis.  He is s/p recent CT guided biopsy of the vertebral bone.  TEE has demonstrated vegetation on his pacemaker leads.  Today, he denies symptoms of palpitations, chest pain, shortness of breath, orthopnea, PND, lower extremity edema, dizziness, presyncope, syncope, or neurologic sequela. The patient is tolerating medications without difficulties and is otherwise without complaint today.   Past Medical History:  Diagnosis Date  . CAD (coronary artery disease)   . CHF (congestive heart failure) ([place])   . COPD (chronic obstructive pulmonary disease) ([place])   . DDD (degenerative disc disease), cervical   . Diabetes mellitus without complication ([place])   . Hypertension   . Osteomyelitis ([place])   . Permanent atrial fibrillation   . RBBB   . Sleep apnea   . Tachycardia-bradycardia syndrome [facility])    Past Surgical History:  Procedure Laterality Date  . BACK SURGERY    . CORONARY ARTERY BYPASS GRAFT     1980s at [facility] in  [place]  . LAMINECTOMY    . PACEMAKER GENERATOR CHANGE  10/30/2015   SJM Assurity VR by Dr Alden Server in [place]  . PACEMAKER IMPLANT  05/01/2009   SJM PPM implanted by Dr Lysle Rubens for bradycardia in [place]  . TOE AMPUTATION      . acidophilus  1 capsule Oral Daily  . atorvastatin  40 mg Oral Daily  . furosemide  40 mg Oral Daily  . insulin aspart  0-9 Units Subcutaneous TID WC  . nicotine  14 mg Transdermal Daily  . pantoprazole  40 mg Oral BID  . rifampin  300 mg Oral Q8H   . sodium chloride Stopped (10/11/18 0249)  . vancomycin 1,000 mg (10/14/18 2022)    Allergies  Allergen Reactions  . Warfarin And Related Itching and Rash    Social History   Socioeconomic History  . Marital status: Married    Spouse name: Not on file  . Number of children: Not on file  . Years of education: Not on file  . Highest education level: Not on file  Occupational History  . Not on file  Social Needs  . Financial resource strain: Not on file  . Food insecurity    Worry: Not on file    Inability: Not on file  . Transportation needs    Medical: Not on file    Non-medical: Not on file  Tobacco Use  . Smoking status: Current Some Day Smoker    Types: Cigarettes  . Smokeless tobacco: Never Used  Substance and  Sexual Activity  . Alcohol use: Not Currently  . Drug use: Not Currently  . Sexual activity: Not Currently  Lifestyle  . Physical activity    Days per week: Not on file    Minutes per session: Not on file  . Stress: Not on file  Relationships  . Social Musician on phone: Not on file    Gets together: Not on file    Attends religious service: Not on file    Active member of club or organization: Not on file    Attends meetings of clubs or organizations: Not on file    Relationship status: Not on file  . Intimate partner violence    Fear of current or ex partner: Not on file    Emotionally abused: Not on file    Physically abused: Not on file    Forced  sexual activity: Not on file  Other Topics Concern  . Not on file  Social History Narrative   Lives with spouse in [place]   2 grown children   Employed by [employer]    FH- HTN  ROS- All systems are reviewed and negative except as per the HPI above  Physical Exam:  Vitals:   10/14/18 0535 10/14/18 1058 10/14/18 1632 10/14/18 1918  BP: 114/60 123/62 (!) 131/59 112/70  Pulse: 60 60 (!) 50 (!) 51  Resp: 17 18 18 20   Temp: 97.6 F (36.4 C) 97.6 F (36.4 C) 98.1 F (36.7 C) 98.7 F (37.1 C)  TempSrc: Oral Oral Oral Oral  SpO2: 100% 98% 100% 100%  Weight:      Height:        GEN- The patient is chronically ill appearing, alert and oriented x 3 today.   Head- normocephalic, atraumatic Eyes-  Sclera clear, conjunctiva pink Ears- hearing intact Oropharynx- clear Neck- supple, no JVP Lungs- normal work of breathing Heart- Regular rate and rhythm (paced) GI- soft, NT, ND, + BS Extremities- no clubbing, cyanosis, or edema MS- diffuse atrophy Skin- no rash or lesion Pacemaker pocket without obvious infection Psych- euthymic mood, full affect Neuro- strength and sensation are intact  EKG- afib, RBBB, PVCs in bigeminy  Labs:   Lab Results  Component Value Date   WBC 11.6 (H) 10/14/2018   HGB 8.0 (L) 10/14/2018   HCT 26.3 (L) 10/14/2018   MCV 85.9 10/14/2018   PLT 350 10/14/2018    Recent Labs  Lab 10/14/18 0716  NA 137  K 3.1*  CL 104  CO2 25  BUN 18  CREATININE 0.69  CALCIUM 8.1*  GLUCOSE 146*   Pacemaker interrogation today by me reveals SJM Assurity VR pacemaker implanted 2017 with good battery status.  Underlying rhythm is afib with V rates 30s-70s,  RV lead pace/sense/ impedance are stable.  V paces 42% I have reprogrammed today VVI 50 bpm to reduce V pacing.   Radiology:  Dual chamber PPM noted  Echo: TEE 10/13/2018 reveals 2.1 x 0.4cm vegetation,  EF 50%, mild RV dysfunction, moderate MR  ASSESSMENT AND PLAN:   1. MRSA bacteremia  Pacemaker system infection, likely secondary to hematogenous seeding of the device from another primary source. Will require extraction this admission.  I will discuss with Dr Ladona Ridgel on Monday. Continue IV antibiotics per ID in the interim. I worry that he may have had a low grade/ smoldering infection for longer than we know.  He is chronically ill appearing and has lost a lot of weight for reasons  that are not clear currently.  2. Symptomatic bradycardia Normal pacemaker function Underlying rhythm is afib with RBBB and V rates 30s-70s.  He is 42% V paced. I have reduced lower rate today to 50 bpm. I will stop metoprolol and digoxin at this time and reassess heart rate tomorrow. It would be nice to avoid temporary pacing if possible. Consider Micra if pacing is required  3. Permanent afib Rate controlled Stop metoprolol and dig as above Reassess heart rates tomorrow Not previously felt to be a coumadin candidate due to GI bleeding.  chads2vasc score is 4.  4. HTN Continue monitor while off metoprolol  I will reassess tomorrow  Very complicated patient.  A high level of decision making was required for this encounter today.  More than 45 minutes spent with the patient and discussing with his spouse by phone.   Hillis RangeJames Deeann Servidio, MD 10/14/2018  10:17 PM

## 2018-10-14 NOTE — Progress Notes (Signed)
Attempted to call wife, Barnetta Chapel to update on pt. N.o answer, message left

## 2018-10-14 NOTE — Progress Notes (Signed)
PROGRESS NOTE  Henry Mcpherson JIR:[id number] DOB: [birth year] DOA: 10/10/2018 PCP: System, Pcp Not In   LOS: 4 days   Patient is from: Home  Brief Narrative / Interim history: 63 y.o. male with medical history significant ofsCHF with EF 45%, A. 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  Patient had surgical resection of left foot by podiatry at OSH.  On day 11 of vancomycin on day of transfer.  Repeat blood culture reportedly negative.  He states that he continues to have back pain, bilateral leg weakness, difficulty walking.  He states that he has decreased sensation in both legs which he attributes to diabetic neuropathy.  Denies new bowel or bladder issues other than usual poor bowel control.  Reportedly had lumbar X-ray 3 weeks ago, and finding was concerning for possible infection as per Dr. Windy Carina review.  On arrival here, hemodynamically stable.  Hemoglobin 7.8 (about baseline).  Electrolytes not impressive.  COVID-19 negative.  Per admitting provider, Dr. Ellene Route from neurosurgery consulted on arrival.  CT lumbar spine showed superior migration of left L4 screw, loosening of left L3 screw, discitis at T12-L1 with regional osteomyelitis and possible discitis at L3-4 and L5-S1.  Patient had IR fluoroscopy guided biopsy of the thoracolumbar discitis on 10/12/2018.  Cultures were sent.  He also had TTE which did not show vegetation.  Underwent TEE on 10/13/2018  Subjective: No major events overnight of this morning.  No complaint this morning.  Pain fairly controlled.  He denies chest pain, dyspnea, abdominal pain, nausea or vomiting.  No changes to bowel or bladder habit.  No new neurologic symptoms in  lower extremities.  Assessment & Plan: MRSA bacteremia: Likely from left foot infection. -Infectious disease following. -ESR and CRP 50/1.5 respectively. -Repeat blood culture at OSH reportedly negative  -Repeat blood culture on 10/11/2018 here negative so far. 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  Possible thoracolumbar discitis and regional osteomyelitis -Status post IR fluoroscopy guided biopsy of the discitis on 10/12/2018 -Tissue culture negative but this is about 12 days after he started antibiotic. -Follow fungal and AFB culture -Antibiotic as above  History of cauda equina syndrome and spinal stenosis of lumbar region:  -s/p of lumbar decompression and fusion by Dr. Saintclair Halsted on 3/4.   -CT lumbar spine concerning for loosening of the screws, discitis and osteomyelitis as above -IR fluoroscopy guided biopsy of the discitis on 10/12/2018-follow cultures. -Appreciate neurosurgery recommendation -Antibiotic as above -PRN Percocet and Flexeril abdominal for pain.  Left foot infection-great toe: s/p of amputation by podiatry at outside hospital.  -Antibiotic as above -Appreciate wound care input  Chronic obstructive lung disease ([place]): stable. -prn albuterol nebs  Controlled IDDM-2 with complications, diabetic foot infection: on  NovoLog and Levemir 20 units at home.  A1c 6.2.  CBG within appropriate range. -SSI -Levemir on hold due to mild hypoglycemia  Essential hypertension: Normotensive -IV Hydralazine prn -Continue home metoprolol  Chronic diastolic CHF:  TTE on 7/51 with EF of 55 to 60% (improved), moderate  LVH, indeterminate DD, moderately reduced RVSP no vegetation.  On 07/05/2018 showed EF  45-50%.  No cardiopulmonary symptoms.  No signs of fluid overload except for bilateral lower extremity edema likely chronic.  -Continue home oral Lasix 40 mg daily -Daily weight, intake output and renal function -Monitor electrolytes and replenish  Permanent atrial Fibrillation: Rate controlled with metoprolol.  CHA2DS2-VASc Score is 5 but not on AC.  Now he has possible GIB with stool guaiac positive.  -continue metoprolol and digoxin  CAD (coronary artery disease): s/p of stent and CABG. No Cp. -continue lipitor and metoprolol  Chronic blood loss anemia and anemia of chronic disease:  FOBT positive.  Hb 8.2 (about baseline).  -protonix 40 bid -Monitor H&H  Stage II pressure injuries over his left buttock: POA -Appreciate wound care input.  Scheduled Meds: . acidophilus  1 capsule Oral Daily  . atorvastatin  40 mg Oral Daily  . digoxin  0.125 mg Oral Daily  . furosemide  40 mg Oral Daily  . insulin aspart  0-9 Units Subcutaneous TID WC  . metoprolol succinate  100 mg Oral BID  . nicotine  14 mg Transdermal Daily  . pantoprazole  40 mg Oral BID  . rifampin  300 mg Oral Q8H   Continuous Infusions: . sodium chloride Stopped (10/11/18 0249)  . vancomycin 1,000 mg (10/13/18 2135)   PRN Meds:.sodium chloride, acetaminophen, albuterol, cyclobenzaprine, hydrALAZINE, ondansetron (ZOFRAN) IV, oxyCODONE-acetaminophen   DVT prophylaxis: SCD given chronic GI bleed/FOBT positive Code Status: Full code Family Communication: Updated patient's wife over the phone on 6/12 Disposition Plan: Remains inpatient for MRSA bacteremia, discitis, osteomyelitis and diabetic left foot wound  Consultants:   Neurosurgery  Infectious disease  Cardiology  IR  EP  Procedures:   IR fluoroscopy guided biopsy of thoracolumbar discitis on 6/11  TEE on 6/12  Microbiology: . Initial blood culture at outside hospital reportedly MRSA . Repeat blood cultures outside hospital reportedly negative  . Blood culture on 10/10/2018-negative so far . MRSA PCR negative . COVID-19 negative . Tissue cultures negative so far . Tissue AFB and fungal culture pending  Antimicrobials: Anti-infectives (From admission, onward)   Start     Dose/Rate Route Frequency Ordered Stop   10/11/18 1400  rifampin (RIFADIN) capsule 300 mg     300 mg Oral Every 8 hours 10/11/18 1100     10/10/18 2130  vancomycin (VANCOCIN) IVPB 1000 mg/200 mL premix     1,000 mg 200 mL/hr over 60 Minutes Intravenous Every 24 hours 10/10/18 2126        Objective: Vitals:   10/13/18 1757 10/13/18 2029 10/14/18 0535 10/14/18 1058  BP: 104/61 106/61 114/60 123/62  Pulse: 60 (!) 50 60 60  Resp: 18 19 17 18   Temp:  98.2 F (36.8 C) 97.6 F (36.4 C) 97.6 F (36.4 C)  TempSrc:  Oral Oral Oral  SpO2: 100% 99% 100% 98%  Weight:  86 kg    Height:        Intake/Output Summary (Last 24 hours) at 10/14/2018 1119 Last data filed at 10/14/2018 0600 Gross per 24 hour  Intake 953.7 ml  Output 2200 ml  Net -1246.3 ml   Filed Weights   10/11/18 2103 10/12/18 1925 10/13/18 2029  Weight: 86.2 kg 85.1 kg 86 kg    Examination:.  GENERAL: No acute distress.  Appears well.  HEENT: MMM.  Vision and hearing grossly intact.  NECK: Supple.  No apparent JVD LUNGS:  No IWOB. Good air movement bilaterally. HEART:  RRR. Heart sounds normal.  ABD: Bowel sounds present. Soft. Non tender.  MSK/EXT: Bilateral lower extremity weakness and edema 2+ SKIN: stage II pressure skin injuries over right buttock.  See picture below NEURO: Awake, alert and oriented appropriately.  Motor 3/5 in both LE.  Light sensation diminished but symmetric.  Patellar reflex symmetric. PSYCH: Calm. Normal affect.  Picture on 10/12/2018    Data Reviewed: I have independently reviewed following labs and imaging studies  CBC: Recent Labs  Lab 10/10/18 2004 10/11/18 0611 10/12/18 0552 10/14/18 0716  WBC 15.8* 11.2* 13.2* 11.6*  HGB 9.0* 8.4* 8.2* 8.0*   HCT 30.0* 27.8* 28.1* 26.3*  MCV 86.7 86.9 87.5 85.9  PLT 379 342 402* 076   Basic Metabolic Panel: Recent Labs  Lab 10/10/18 2004 10/10/18 2028 10/11/18 0611 10/13/18 2113 10/14/18 0716  NA 141  --  140 138 137  K 3.4*  --  3.2* 3.3* 3.1*  CL 108  --  105 104 104  CO2 27  --  27 24 25   GLUCOSE 146*  --  128* 174* 146*  BUN 15  --  15 19 18   CREATININE 0.69 0.69 0.63 0.92 0.69  CALCIUM 8.6*  --  8.5* 8.0* 8.1*   GFR: Estimated Creatinine Clearance: 107 mL/min (by C-G formula based on SCr of 0.69 mg/dL). Liver Function Tests: No results for input(s): AST, ALT, ALKPHOS, BILITOT, PROT, ALBUMIN in the last 168 hours. No results for input(s): LIPASE, AMYLASE in the last 168 hours. No results for input(s): AMMONIA in the last 168 hours. Coagulation Profile: Recent Labs  Lab 10/11/18 0611  INR 1.2   Cardiac Enzymes: No results for input(s): CKTOTAL, CKMB, CKMBINDEX, TROPONINI in the last 168 hours. BNP (last 3 results) No results for input(s): PROBNP in the last 8760 hours. HbA1C: Recent Labs    10/13/18 0528  HGBA1C 6.2*   CBG: Recent Labs  Lab 10/13/18 1142 10/13/18 1655 10/13/18 2032 10/14/18 0613 10/14/18 0741  GLUCAP 116* 131* 164* 145* 137*   Lipid Profile: Recent Labs    10/13/18 0528  CHOL 86  HDL 22*  LDLCALC 41  TRIG 113  CHOLHDL 3.9   Thyroid Function Tests: No results for input(s): TSH, T4TOTAL, FREET4, T3FREE, THYROIDAB in the last 72 hours. Anemia Panel: No results for input(s): VITAMINB12, FOLATE, FERRITIN, TIBC, IRON, RETICCTPCT in the last 72 hours. Urine analysis:    Component Value Date/Time   COLORURINE YELLOW 07/08/2018 0224   APPEARANCEUR CLEAR 07/08/2018 0224   LABSPEC 1.011 07/08/2018 0224   PHURINE 6.0 07/08/2018 0224   GLUCOSEU NEGATIVE 07/08/2018 0224   HGBUR SMALL (A) 07/08/2018 0224   BILIRUBINUR NEGATIVE 07/08/2018 0224   KETONESUR NEGATIVE 07/08/2018 0224   PROTEINUR NEGATIVE 07/08/2018 0224   NITRITE NEGATIVE  07/08/2018 0224   LEUKOCYTESUR SMALL (A) 07/08/2018 0224   Sepsis Labs: Invalid input(s): PROCALCITONIN, LACTICIDVEN  Recent Results (from the past 240 hour(s))  Culture, blood (Routine X 2) w Reflex to ID Panel     Status: None (Preliminary result)   Collection Time: 10/10/18  7:55 PM   Specimen: BLOOD  Result Value Ref Range Status   Specimen Description BLOOD LEFT ARM  Final   Special Requests   Final    BOTTLES DRAWN AEROBIC ONLY Blood Culture adequate volume   Culture   Final    NO GROWTH 4 DAYS Performed at [facility], [address], [place], [place] [address]    Report Status PENDING  Incomplete  Culture,  blood (Routine X 2) w Reflex to ID Panel     Status: None (Preliminary result)   Collection Time: 10/10/18  8:00 PM   Specimen: BLOOD  Result Value Ref Range Status   Specimen Description BLOOD LEFT ARM  Final   Special Requests   Final    BOTTLES DRAWN AEROBIC ONLY Blood Culture adequate volume   Culture   Final    NO GROWTH 4 DAYS Performed at [facility], [address] [place], [place] [address]    Report Status PENDING  Incomplete  Surgical pcr screen     Status: None   Collection Time: 10/11/18 12:21 AM   Specimen: Nasal Mucosa; Nasal Swab  Result Value Ref Range Status   MRSA, PCR NEGATIVE NEGATIVE Final   Staphylococcus aureus NEGATIVE NEGATIVE Final    Comment: (NOTE) The Xpert SA Assay (FDA approved for NASAL specimens in patients 26 years of age and older), is one component of a comprehensive surveillance program. It is not intended to diagnose infection nor to guide or monitor treatment. Performed at [facility], [place] [address] [place], [place] [address]   SARS Coronavirus 2     Status: None   Collection Time: 10/11/18 12:23 AM  Result Value Ref Range Status   SARS Coronavirus 2 NOT DETECTED NOT DETECTED Final    Comment: (NOTE) SARS-CoV-2 target nucleic acids are NOT DETECTED. The SARS-CoV-2 RNA is generally detectable  in upper and lower respiratory specimens during the acute phase of infection.  Negative  results do not preclude SARS-CoV-2 infection, do not rule out co-infections with other pathogens, and should not be used as the sole basis for treatment or other patient management decisions.  Negative results must be combined with clinical observations, patient history, and epidemiological information. The expected result is Not Detected. Fact Sheet for Patients: http://www.biofiredefense.com/wp-content/uploads/2020/03/BIOFIRE-COVID -19-patients.pdf Fact Sheet for Healthcare Providers: http://www.biofiredefense.com/wp-content/uploads/2020/03/BIOFIRE-COVID -19-hcp.pdf This test is not yet approved or cleared by the [place] and  has been authorized for detection and/or diagnosis of SARS-CoV-2 by FDA under an Emergency Use Authorization (EUA).  This EUA will remain in effec t (meaning this test can be used) for the duration of  the COVID-19 declaration under Section 564(b)(1) of the Act, 21 U.S.C. section 360bbb-3(b)(1), unless the authorization is terminated or revoked sooner. Performed at [facility], [place] [address], [place], [place] [address]   Body fluid culture     Status: None (Preliminary result)   Collection Time: 10/12/18 11:15 AM   Specimen: Fluid; Wound  Result Value Ref Range Status   Specimen Description FLUID  Final   Special Requests NONE  Final   Gram Stain   Final    RARE WBC PRESENT, PREDOMINANTLY PMN NO ORGANISMS SEEN    Culture   Final    NO GROWTH 2 DAYS Performed at [facility] [address], [place], [place] [address]    Report Status PENDING  Incomplete  Acid Fast Smear (AFB)     Status: None   Collection Time: 10/12/18 11:15 AM   Specimen: Fluid; Wound  Result Value Ref Range Status   AFB Specimen Processing Concentration  Final   Acid Fast Smear Negative  Final    Comment: (NOTE) Performed At: [facility] [place], [place] [id number] Rush Farmer MD KP:[id number]    Source (AFB) FLUID  Final    Comment: Performed at [facility], [place] [address], [place], [place]  [place]      Radiology Studies: No results found.  Shameeka Silliman T. [facility] Pager [phone]  If 7PM-7AM, please contact night-coverage [email or web address] Password [facility] 10/14/2018, 11:19 AM

## 2018-10-14 NOTE — Plan of Care (Signed)
  Problem: Clinical Measurements: Goal: Ability to maintain clinical measurements within normal limits will improve Outcome: Progressing   

## 2018-10-14 NOTE — Progress Notes (Signed)
Pt wife, Chirag Krueger updated on plan of care.

## 2018-10-15 ENCOUNTER — Encounter (HOSPITAL_COMMUNITY): Payer: Self-pay | Admitting: Internal Medicine

## 2018-10-15 DIAGNOSIS — E876 Hypokalemia: Secondary | ICD-10-CM

## 2018-10-15 LAB — BASIC METABOLIC PANEL
Anion gap: 8 (ref 5–15)
BUN: 16 mg/dL (ref 8–23)
CO2: 26 mmol/L (ref 22–32)
Calcium: 8.3 mg/dL — ABNORMAL LOW (ref 8.9–10.3)
Chloride: 105 mmol/L (ref 98–111)
Creatinine, Ser: 0.71 mg/dL (ref 0.61–1.24)
GFR calc Af Amer: >60 mL/min (ref >60)
GFR calc non Af Amer: >60 mL/min (ref >60)
Glucose, Bld: 105 mg/dL — ABNORMAL HIGH (ref 70–99)
Potassium: 3.4 mmol/L — ABNORMAL LOW (ref 3.5–5.1)
Sodium: 139 mmol/L (ref 135–145)

## 2018-10-15 LAB — MAGNESIUM: Magnesium: 1.2 mg/dL — ABNORMAL LOW (ref 1.7–2.4)

## 2018-10-15 LAB — GLUCOSE, CAPILLARY
Glucose-Capillary: 97 mg/dL (ref 70–99)
Glucose-Capillary: 169 mg/dL — ABNORMAL HIGH (ref 70–99)
Glucose-Capillary: 138 mg/dL — ABNORMAL HIGH (ref 70–99)
Glucose-Capillary: 126 mg/dL — ABNORMAL HIGH (ref 70–99)

## 2018-10-15 LAB — CULTURE, BLOOD (ROUTINE X 2)
Culture: NO GROWTH
Culture: NO GROWTH
Special Requests: ADEQUATE
Special Requests: ADEQUATE

## 2018-10-15 LAB — BODY FLUID CULTURE: Culture: NO GROWTH

## 2018-10-15 MED ORDER — POTASSIUM CHLORIDE CRYS ER 20 MEQ PO TBCR
40.0000 meq | EXTENDED_RELEASE_TABLET | ORAL | Status: AC
  Administered 2018-10-15 (×2): 40 meq via ORAL
  Filled 2018-10-15 (×2): qty 2

## 2018-10-15 MED ORDER — MAGNESIUM SULFATE 2 GM/50ML IV SOLN
2.0000 g | Freq: Once | INTRAVENOUS | Status: AC
  Administered 2018-10-15: 2 g via INTRAVENOUS
  Filled 2018-10-15: qty 50

## 2018-10-15 NOTE — Plan of Care (Signed)

## 2018-10-15 NOTE — Progress Notes (Signed)
PROGRESS NOTE  Henry Mcpherson XNA:[id number] DOB: 15-Mar-1956 DOA: 10/10/2018 PCP: System, Pcp Not In   LOS: 5 days   Patient is from: Home  Brief Narrative / Interim history: 63 y.o. male with medical history significant ofsCHF with EF 45%, A. 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  Patient had surgical resection of left foot by podiatry at OSH.  On day 11 of vancomycin on day of transfer.  Repeat blood culture reportedly negative.  He states that he continues to have back pain, bilateral leg weakness, difficulty walking.  He states that he has decreased sensation in both legs which he attributes to diabetic neuropathy.  Denies new bowel or bladder issues other than usual poor bowel control.  Reportedly had lumbar X-ray 3 weeks ago, and finding was concerning for possible infection as per Dr. Windy Carina review.  On arrival here, hemodynamically stable.  Hemoglobin 7.8 (about baseline).  Electrolytes not impressive.  COVID-19 negative.  Per admitting provider, Dr. Ellene Route from neurosurgery consulted on arrival.  CT lumbar spine showed superior migration of left L4 screw, loosening of left L3 screw, discitis at T12-L1 with regional osteomyelitis and possible discitis at L3-4 and L5-S1.  Patient had IR fluoroscopy guided biopsy of the thoracolumbar discitis on 10/12/2018.  Cultures were sent.  He also had TTE which did not show vegetation.  Underwent TEE on 10/13/2018  Subjective: No major events overnight of this morning.  No complaint this morning.  Pain fairly controlled.  He denies chest pain, dyspnea, abdominal pain, nausea or vomiting.  No changes to bowel or bladder habit.  No new neurologic symptoms in  lower extremities.  Assessment & Plan: MRSA bacteremia: Likely from left foot infection. -Infectious disease following. -ESR and CRP 50/1.5 respectively. -Repeat blood culture at OSH reportedly negative  -Repeat blood culture on 10/11/2018 here negative so far. -Continue IV vancomycin-started at OSH 11 days prior to transfer -Rifampin added per ID on 6/10 -Trend leukocytosis  Possible pacemaker lead vegetation versus thrombus -TEE showed 2.1 x 0.4 cm lesion in right atrium attached to the pacemaker lead -EP consulted- Reprogrammed his device VVI to 30 bpm.  Hopeful for extraction and no reimplantation at least for short-term -Pacemaker not MRI compatible per cardiology.  Possible thoracolumbar discitis and regional osteomyelitis -Status post IR fluoroscopy guided biopsy of the discitis on 10/12/2018 -Tissue culture including fungal cultures negative but this is about 12 days after he started antibiotic. -Follow AFB culture.  AFB smear negative. -Antibiotic as above  History of cauda equina syndrome and spinal stenosis of lumbar region:  -s/p of lumbar decompression and fusion by Dr. Saintclair Halsted on 3/4.   -CT lumbar spine concerning for loosening of the screws, discitis and osteomyelitis as above -IR fluoroscopy guided biopsy of the discitis on 10/12/2018-follow cultures. -Appreciate neurosurgery recommendation -Antibiotic as above -PRN Percocet and Flexeril abdominal for pain. -We will clarify about PT/OT with neurosurgery given loosening of the screws.  Left foot infection-great toe: s/p of amputation by podiatry at outside hospital.  -Antibiotic as above -Appreciate wound care input  Chronic obstructive lung disease ([place]): stable. -prn albuterol nebs  Controlled IDDM-2 with complications, diabetic foot infection: on  NovoLog and Levemir 20 units at home.  A1c 6.2.  CBG within appropriate range. -SSI -  Levemir on hold  Essential hypertension: Normotensive -IV Hydralazine prn  -Metoprolol on hold by cardiology  Chronic diastolic CHF:  TTE on 7/20 with EF of 55 to 60% (improved), moderate LVH, indeterminate DD, moderately reduced RVSP no vegetation.  On 07/05/2018 showed EF 45-50%.  No cardiopulmonary symptoms.  No signs of fluid overload except for bilateral lower extremity edema likely chronic.  -Continue home oral Lasix 40 mg daily -Daily weight, intake output and renal function -Monitor electrolytes and replenish  Permanent atrial Fibrillation: Rate controlled with metoprolol.  CHA2DS2-VASc Score is 5 but not on AC.  Now he has possible GIB with stool guaiac positive.  -Metoprolol and digoxin on hold by cardiology  CAD (coronary artery disease): s/p of stent and CABG. No Cp. -Continue lipitor -Metoprolol on hold by cardiology.  Chronic blood loss anemia and anemia of chronic disease:  FOBT positive.  Hb 8.2 (about baseline).  -protonix 40 bid -Monitor H&H  Stage II pressure injuries over his left buttock: POA -Appreciate wound care input.  Scheduled Meds: . acidophilus  1 capsule Oral Daily  . atorvastatin  40 mg Oral Daily  . furosemide  40 mg Oral Daily  . insulin aspart  0-9 Units Subcutaneous TID WC  . nicotine  14 mg Transdermal Daily  . pantoprazole  40 mg Oral BID  . rifampin  300 mg Oral Q8H   Continuous Infusions: . sodium chloride Stopped (10/11/18 0249)  . vancomycin 1,000 mg (10/14/18 2022)   PRN Meds:.sodium chloride, acetaminophen, albuterol, cyclobenzaprine, hydrALAZINE, ondansetron (ZOFRAN) IV, oxyCODONE-acetaminophen   DVT prophylaxis: SCD given chronic GI bleed/FOBT positive Code Status: Full code Family Communication: Updated patient's wife over the phone on 6/12 Disposition Plan: Remains inpatient for MRSA bacteremia with pacemaker vegetation, discitis, osteomyelitis and diabetic left foot wound  Consultants:   Neurosurgery  Infectious disease  EP/cardiology  IR  Procedures:   IR fluoroscopy guided biopsy of  thoracolumbar discitis on 6/11  TEE on 6/12  Microbiology: . Initial blood culture at outside hospital reportedly MRSA . Repeat blood cultures outside hospital reportedly negative . Blood culture on 10/10/2018-negative so far . MRSA PCR negative . COVID-19 negative . Tissue cultures negative so far . Tissue fungal cultures negative so far . Tissue AFB culture pending  Antimicrobials: Anti-infectives (From admission, onward)   Start     Dose/Rate Route Frequency Ordered Stop   10/11/18 1400  rifampin (RIFADIN) capsule 300 mg     300 mg Oral Every 8 hours 10/11/18 1100     10/10/18 2130  vancomycin (VANCOCIN) IVPB 1000 mg/200 mL premix     1,000 mg 200 mL/hr over 60 Minutes Intravenous Every 24 hours 10/10/18 2126        Objective: Vitals:   10/14/18 1058 10/14/18 1632 10/14/18 1918 10/15/18 0600  BP: 123/62 (!) 131/59 112/70   Pulse: 60 (!) 50 (!) 51   Resp: 18 18 20    Temp: 97.6 F (36.4 C) 98.1 F (36.7 C) 98.7 F (37.1 C)   TempSrc: Oral Oral Oral   SpO2: 98% 100% 100%   Weight:    88 kg  Height:        Intake/Output Summary (Last 24 hours) at 10/15/2018 0754 Last data filed at 10/15/2018 0600 Gross per 24 hour  Intake 600 ml  Output 1750 ml  Net -1150 ml   Filed Weights   10/12/18 1925 10/13/18 2029 10/15/18 0600  Weight: 85.1 kg 86 kg 88 kg    Examination:.  GENERAL: No acute distress.  Appears well.  HEENT: MMM.  Vision and hearing grossly intact.  NECK: Supple.  No apparent JVD LUNGS:  No IWOB. Good air movement bilaterally. HEART:  RRR. Heart sounds normal.  ABD: Bowel sounds present. Soft. Non tender.  MSK/EXT: Bilateral lower extremity weakness and 2+ edema SKIN: Stage II pressure skin injuries over right buttock.  See picture below for more NEURO: Awake, alert and oriented appropriately.  Motor 3/5 in both lower extremities.  Light sensation diminished but symmetric.  Patellar reflexes symmetric. PSYCH: Calm. Normal affect.  Picture on 10/12/2018     Data Reviewed: I have independently reviewed following labs and imaging studies  CBC: Recent Labs  Lab 10/10/18 2004 10/11/18 0611 10/12/18 0552 10/14/18 0716  WBC 15.8* 11.2* 13.2* 11.6*  HGB 9.0* 8.4* 8.2* 8.0*  HCT 30.0* 27.8* 28.1* 26.3*  MCV 86.7 86.9 87.5 85.9  PLT 379 342 402* 154   Basic Metabolic Panel: Recent Labs  Lab 10/10/18 2004 10/10/18 2028 10/11/18 0611 10/13/18 2113 10/14/18 0716  NA 141  --  140 138 137  K 3.4*  --  3.2* 3.3* 3.1*  CL 108  --  105 104 104  CO2 27  --  27 24 25   GLUCOSE 146*  --  128* 174* 146*  BUN 15  --  15 19 18   CREATININE 0.69 0.69 0.63 0.92 0.69  CALCIUM 8.6*  --  8.5* 8.0* 8.1*   GFR: Estimated Creatinine Clearance: 107 mL/min (by C-G formula based on SCr of 0.69 mg/dL). Liver Function Tests: No results for input(s): AST, ALT, ALKPHOS, BILITOT, PROT, ALBUMIN in the last 168 hours. No results for input(s): LIPASE, AMYLASE in the last 168 hours. No results for input(s): AMMONIA in the last 168 hours. Coagulation Profile: Recent Labs  Lab 10/11/18 0611  INR 1.2   Cardiac Enzymes: No results for input(s): CKTOTAL, CKMB, CKMBINDEX, TROPONINI in the last 168 hours. BNP (last 3 results) No results for input(s): PROBNP in the last 8760 hours. HbA1C: Recent Labs    10/13/18 0528  HGBA1C 6.2*   CBG: Recent Labs  Lab 10/14/18 0741 10/14/18 1133 10/14/18 1633 10/14/18 2136 10/15/18 0659  GLUCAP 137* 164* 151* 134* 97   Lipid Profile: Recent Labs    10/13/18 0528  CHOL 86  HDL 22*  LDLCALC 41  TRIG 113  CHOLHDL 3.9   Thyroid Function Tests: No results for input(s): TSH, T4TOTAL, FREET4, T3FREE, THYROIDAB in the last 72 hours. Anemia Panel: No results for input(s): VITAMINB12, FOLATE, FERRITIN, TIBC, IRON, RETICCTPCT in the last 72 hours. Urine analysis:    Component Value Date/Time   COLORURINE YELLOW 07/08/2018 0224   APPEARANCEUR CLEAR 07/08/2018 0224   LABSPEC 1.011 07/08/2018 0224   PHURINE  6.0 07/08/2018 0224   GLUCOSEU NEGATIVE 07/08/2018 0224   HGBUR SMALL (A) 07/08/2018 0224   BILIRUBINUR NEGATIVE 07/08/2018 0224   KETONESUR NEGATIVE 07/08/2018 0224   PROTEINUR NEGATIVE 07/08/2018 0224   NITRITE NEGATIVE 07/08/2018 0224   LEUKOCYTESUR SMALL (A) 07/08/2018 0224   Sepsis Labs: Invalid input(s): PROCALCITONIN, LACTICIDVEN  Recent Results (from the past 240 hour(s))  Culture, blood (Routine X 2) w Reflex to ID Panel     Status: None (Preliminary result)   Collection Time: 10/10/18  7:55 PM   Specimen: BLOOD  Result Value Ref Range Status   Specimen Description BLOOD LEFT ARM  Final   Special Requests   Final    BOTTLES DRAWN AEROBIC ONLY Blood Culture adequate volume   Culture  Final    NO GROWTH 4 DAYS Performed at [facility], [place] [address] [place], [place] [address]    Report Status PENDING  Incomplete  Culture, blood (Routine X 2) w Reflex to ID Panel     Status: None (Preliminary result)   Collection Time: 10/10/18  8:00 PM   Specimen: BLOOD  Result Value Ref Range Status   Specimen Description BLOOD LEFT ARM  Final   Special Requests   Final    BOTTLES DRAWN AEROBIC ONLY Blood Culture adequate volume   Culture   Final    NO GROWTH 4 DAYS Performed at [facility], [place] [address], [place], [place] [address]    Report Status PENDING  Incomplete  Surgical pcr screen     Status: None   Collection Time: 10/11/18 12:21 AM   Specimen: Nasal Mucosa; Nasal Swab  Result Value Ref Range Status   MRSA, PCR NEGATIVE NEGATIVE Final   Staphylococcus aureus NEGATIVE NEGATIVE Final    Comment: (NOTE) The Xpert SA Assay (FDA approved for NASAL specimens in patients 56 years of age and older), is one component of a comprehensive surveillance program. It is not intended to diagnose infection nor to guide or monitor treatment. Performed at [facility], [place] [address], [place], [place] [address]   SARS Coronavirus 2     Status: None    Collection Time: 10/11/18 12:23 AM  Result Value Ref Range Status   SARS Coronavirus 2 NOT DETECTED NOT DETECTED Final    Comment: (NOTE) SARS-CoV-2 target nucleic acids are NOT DETECTED. The SARS-CoV-2 RNA is generally detectable in upper and lower respiratory specimens during the acute phase of infection.  Negative  results do not preclude SARS-CoV-2 infection, do not rule out co-infections with other pathogens, and should not be used as the sole basis for treatment or other patient management decisions.  Negative results must be combined with clinical observations, patient history, and epidemiological information. The expected result is Not Detected. Fact Sheet for Patients: http://www.biofiredefense.com/wp-content/uploads/2020/03/BIOFIRE-COVID -19-patients.pdf Fact Sheet for Healthcare Providers: http://www.biofiredefense.com/wp-content/uploads/2020/03/BIOFIRE-COVID -19-hcp.pdf This test is not yet approved or cleared by the [place] and  has been authorized for detection and/or diagnosis of SARS-CoV-2 by FDA under an Emergency Use Authorization (EUA).  This EUA will remain in effec t (meaning this test can be used) for the duration of  the COVID-19 declaration under Section 564(b)(1) of the Act, 21 U.S.C. section 360bbb-3(b)(1), unless the authorization is terminated or revoked sooner. Performed at [facility], [place] [address], [place], [place] [address]   Body fluid culture     Status: None (Preliminary result)   Collection Time: 10/12/18 11:15 AM   Specimen: Fluid; Wound  Result Value Ref Range Status   Specimen Description FLUID  Final   Special Requests NONE  Final   Gram Stain   Final    RARE WBC PRESENT, PREDOMINANTLY PMN NO ORGANISMS SEEN    Culture   Final    NO GROWTH 2 DAYS Performed at [facility] [address] [place], [place] [address]    Report Status PENDING  Incomplete  Acid Fast Smear (AFB)     Status: None   Collection  Time: 10/12/18 11:15 AM   Specimen: Fluid; Wound  Result Value Ref Range Status   AFB Specimen Processing Concentration  Final   Acid Fast Smear Negative  Final    Comment: (NOTE) Performed At: [facility] [address]  [place], [place] [id number] Rush Farmer MD [id number]    Source (AFB) FLUID  Final    Comment: Performed at [facility], [place] [address], [place], [place] [address]  Culture, fungus without smear     Status: None (Preliminary result)   Collection Time: 10/12/18 11:15 AM   Specimen: Fluid  Result Value Ref Range Status   Specimen Description FLUID  Final   Special Requests NONE  Final   Culture   Final    NO FUNGUS ISOLATED AFTER 2 DAYS Performed at [facility], [place] [address] [place], [place] [address]    Report Status PENDING  Incomplete      Radiology Studies: No results found.  Sheylin Scharnhorst T. [facility] Pager [phone]  If 7PM-7AM, please contact night-coverage [email or web address] Password [facility] 10/15/2018, 7:54 AM

## 2018-10-15 NOTE — Progress Notes (Signed)
Progress Note   Subjective   Doing well today, the patient denies CP or SOB.  No new concerns  Inpatient Medications    Scheduled Meds: . acidophilus  1 capsule Oral Daily  . atorvastatin  40 mg Oral Daily  . furosemide  40 mg Oral Daily  . insulin aspart  0-9 Units Subcutaneous TID WC  . nicotine  14 mg Transdermal Daily  . pantoprazole  40 mg Oral BID  . potassium chloride  40 mEq Oral Q4H  . rifampin  300 mg Oral Q8H   Continuous Infusions: . sodium chloride Stopped (10/11/18 0249)  . magnesium sulfate bolus IVPB    . vancomycin 1,000 mg (10/14/18 2022)   PRN Meds: sodium chloride, acetaminophen, albuterol, cyclobenzaprine, hydrALAZINE, ondansetron (ZOFRAN) IV, oxyCODONE-acetaminophen   Vital Signs    Vitals:   10/14/18 1632 10/14/18 1918 10/15/18 0600 10/15/18 0924  BP: (!) 131/59 112/70  115/73  Pulse: (!) 50 (!) 51  69  Resp: 18 20  18   Temp: 98.1 F (36.7 C) 98.7 F (37.1 C)  97.8 F (36.6 C)  TempSrc: Oral Oral  Oral  SpO2: 100% 100%  99%  Weight:   88 kg   Height:        Intake/Output Summary (Last 24 hours) at 10/15/2018 1046 Last data filed at 10/15/2018 0900 Gross per 24 hour  Intake 600 ml  Output 1850 ml  Net -1250 ml   Filed Weights   10/12/18 1925 10/13/18 2029 10/15/18 0600  Weight: 85.1 kg 86 kg 88 kg    Physical Exam   GEN- The patient is ill appearing, alert and oriented x 3 today.   Head- normocephalic, atraumatic Eyes-  Sclera clear, conjunctiva pink Ears- hearing intact Oropharynx- clear Neck- supple, Lungs-   normal work of breathing Heart- irregular rate and rhythm  GI- soft  Extremities- no clubbing, cyanosis, + dependant edema  MS- diffuse atrophy Skin- no rash or lesion Psych- euthymic mood, full affect Neuro- strength and sensation are intact   Labs    Chemistry Recent Labs  Lab 10/13/18 2113 10/14/18 0716 10/15/18 0735  NA 138 137 139  K 3.3* 3.1* 3.4*  CL 104 104 105  CO2 24 25 26   GLUCOSE 174* 146*  105*  BUN 19 18 16   CREATININE 0.92 0.69 0.71  CALCIUM 8.0* 8.1* 8.3*  GFRNONAA >60 >60 >60  GFRAA >60 >60 >60  ANIONGAP 10 8 8      Hematology Recent Labs  Lab 10/11/18 0611 10/12/18 0552 10/14/18 0716  WBC 11.2* 13.2* 11.6*  RBC 3.20* 3.21* 3.06*  HGB 8.4* 8.2* 8.0*  HCT 27.8* 28.1* 26.3*  MCV 86.9 87.5 85.9  MCH 26.3 25.5* 26.1  MCHC 30.2 29.2* 30.4  RDW 17.4* 17.6* 17.6*  PLT 342 402* 350    Cardiac EnzymesNo results for input(s): TROPONINI in the last 168 hours. No results for input(s): TROPIPOC in the last 168 hours.      Assessment & Plan    1.  MRSA bacteremia with pacemaker system extraction I have spoken with Dr Lovena Le who will see the patient.  We will plan for pacemaker system extraction this week. He is not device dependant.  I am hopeful that we can avoid reimplantation (at least for the short term)  2. Symptomatic bradycardia He does have av nodal conduction system disease I have held his toprol and digoxin and now his V rates are mostly 70s. I have reprogrammed his device VVI 30 bpm.  We  will see if we can avoid reimplantation based on how he does over the next day or so.  3. Permanent afib As above Not felt to be a candidate for anticoagulation due to GI bleeding. chads2vasc score is 4. I will put back on telemetry for the weekend We will allow some degree of permissive tachycardia in hopes to avoid reimplantation for the short term  4. HTN Stable No change required today   Hillis Range MD, [facility] 10/15/2018 10:46 AM

## 2018-10-15 NOTE — Progress Notes (Signed)
I spoke with patients wife to give her an update on her husbands status. She wanted to know if PT could work with her husband. I will notify the MD. Also, the wife had some questions for Dr. Rayann Heman when he comes to make rounds. All other questions answered.

## 2018-10-15 NOTE — Progress Notes (Signed)
Henry Mcpherson,spouse called to update, questions answered.

## 2018-10-16 ENCOUNTER — Other Ambulatory Visit (HOSPITAL_COMMUNITY): Payer: Self-pay | Admitting: *Deleted

## 2018-10-16 ENCOUNTER — Encounter (HOSPITAL_COMMUNITY): Payer: Self-pay | Admitting: Interventional Radiology

## 2018-10-16 ENCOUNTER — Inpatient Hospital Stay (HOSPITAL_COMMUNITY): Payer: Medicare Other

## 2018-10-16 ENCOUNTER — Inpatient Hospital Stay (HOSPITAL_COMMUNITY): Payer: Medicare Other | Admitting: Certified Registered Nurse Anesthetist

## 2018-10-16 ENCOUNTER — Encounter (HOSPITAL_COMMUNITY)
Admission: AD | Disposition: A | Discharge status: Home Health | Payer: Self-pay | Source: Other Acute Inpatient Hospital | Attending: Student

## 2018-10-16 DIAGNOSIS — M4644 Discitis, unspecified, thoracic region: Secondary | ICD-10-CM

## 2018-10-16 DIAGNOSIS — B9562 Methicillin resistant Staphylococcus aureus infection as the cause of diseases classified elsewhere: Secondary | ICD-10-CM

## 2018-10-16 DIAGNOSIS — R7881 Bacteremia: Secondary | ICD-10-CM

## 2018-10-16 DIAGNOSIS — M4624 Osteomyelitis of vertebra, thoracic region: Secondary | ICD-10-CM

## 2018-10-16 DIAGNOSIS — R601 Generalized edema: Secondary | ICD-10-CM

## 2018-10-16 DIAGNOSIS — I339 Acute and subacute endocarditis, unspecified: Secondary | ICD-10-CM

## 2018-10-16 DIAGNOSIS — T827XXA Infection and inflammatory reaction due to other cardiac and vascular devices, implants and grafts, initial encounter: Secondary | ICD-10-CM

## 2018-10-16 HISTORY — PX: TEE WITHOUT CARDIOVERSION: SHX5443

## 2018-10-16 HISTORY — PX: PACEMAKER LEAD REMOVAL: SHX5064

## 2018-10-16 LAB — CBC
HCT: 31.7 % — ABNORMAL LOW (ref 39.0–52.0)
HCT: 30.5 % — ABNORMAL LOW (ref 39.0–52.0)
Hemoglobin: 9.3 g/dL — ABNORMAL LOW (ref 13.0–17.0)
Hemoglobin: 9 g/dL — ABNORMAL LOW (ref 13.0–17.0)
MCH: 25.6 pg — ABNORMAL LOW (ref 26.0–34.0)
MCH: 26.1 pg (ref 26.0–34.0)
MCHC: 29.3 g/dL — ABNORMAL LOW (ref 30.0–36.0)
MCHC: 29.5 g/dL — ABNORMAL LOW (ref 30.0–36.0)
MCV: 87.3 fL (ref 80.0–100.0)
MCV: 88.4 fL (ref 80.0–100.0)
Platelets: 394 10*3/uL (ref 150–400)
Platelets: 369 10*3/uL (ref 150–400)
RBC: 3.63 MIL/uL — ABNORMAL LOW (ref 4.22–5.81)
RBC: 3.45 MIL/uL — ABNORMAL LOW (ref 4.22–5.81)
RDW: 18.1 % — ABNORMAL HIGH (ref 11.5–15.5)
RDW: 18.5 % — ABNORMAL HIGH (ref 11.5–15.5)
WBC: 12 10*3/uL — ABNORMAL HIGH (ref 4.0–10.5)
WBC: 13.3 10*3/uL — ABNORMAL HIGH (ref 4.0–10.5)
nRBC: 0 % (ref 0.0–0.2)
nRBC: 0 % (ref 0.0–0.2)

## 2018-10-16 LAB — BASIC METABOLIC PANEL
Anion gap: 7 (ref 5–15)
BUN: 13 mg/dL (ref 8–23)
CO2: 27 mmol/L (ref 22–32)
Calcium: 8.9 mg/dL (ref 8.9–10.3)
Chloride: 104 mmol/L (ref 98–111)
Creatinine, Ser: 0.66 mg/dL (ref 0.61–1.24)
GFR calc Af Amer: >60 mL/min (ref >60)
GFR calc non Af Amer: >60 mL/min (ref >60)
Glucose, Bld: 179 mg/dL — ABNORMAL HIGH (ref 70–99)
Potassium: 3.5 mmol/L (ref 3.5–5.1)
Sodium: 138 mmol/L (ref 135–145)

## 2018-10-16 LAB — ECHO INTRAOPERATIVE TEE
Height: 74 in
Weight: 3047.64 oz

## 2018-10-16 LAB — MAGNESIUM: Magnesium: 1.7 mg/dL (ref 1.7–2.4)

## 2018-10-16 LAB — ALBUMIN: Albumin: 2.1 g/dL — ABNORMAL LOW (ref 3.5–5.0)

## 2018-10-16 LAB — GLUCOSE, CAPILLARY
Glucose-Capillary: 124 mg/dL — ABNORMAL HIGH (ref 70–99)
Glucose-Capillary: 132 mg/dL — ABNORMAL HIGH (ref 70–99)
Glucose-Capillary: 120 mg/dL — ABNORMAL HIGH (ref 70–99)
Glucose-Capillary: 133 mg/dL — ABNORMAL HIGH (ref 70–99)
Glucose-Capillary: 147 mg/dL — ABNORMAL HIGH (ref 70–99)

## 2018-10-16 LAB — CREATININE, SERUM
Creatinine, Ser: 0.66 mg/dL (ref 0.61–1.24)
GFR calc Af Amer: >60 mL/min (ref >60)
GFR calc non Af Amer: >60 mL/min (ref >60)

## 2018-10-16 LAB — PREPARE RBC (CROSSMATCH)

## 2018-10-16 LAB — SURGICAL PCR SCREEN
MRSA, PCR: NEGATIVE
Staphylococcus aureus: NEGATIVE

## 2018-10-16 SURGERY — REMOVAL, ELECTRODE LEAD, CARDIAC PACEMAKER, WITHOUT REPLACEMENT
Anesthesia: General | Site: Chest

## 2018-10-16 MED ORDER — FENTANYL CITRATE (PF) 250 MCG/5ML IJ SOLN
INTRAMUSCULAR | Status: AC
  Filled 2018-10-16: qty 5

## 2018-10-16 MED ORDER — MIDAZOLAM HCL 2 MG/2ML IJ SOLN
INTRAMUSCULAR | Status: DC | PRN
  Administered 2018-10-16: 1 mg via INTRAVENOUS

## 2018-10-16 MED ORDER — LACTATED RINGERS IV SOLN
INTRAVENOUS | Status: DC
  Administered 2018-10-16 (×2): via INTRAVENOUS

## 2018-10-16 MED ORDER — CEFAZOLIN SODIUM-DEXTROSE 2-4 GM/100ML-% IV SOLN
2.0000 g | INTRAVENOUS | Status: AC
  Administered 2018-10-16: 2 g via INTRAVENOUS
  Filled 2018-10-16 (×2): qty 100

## 2018-10-16 MED ORDER — LIDOCAINE HCL (PF) 1 % IJ SOLN
INTRAMUSCULAR | Status: AC
  Filled 2018-10-16: qty 30

## 2018-10-16 MED ORDER — FENTANYL CITRATE (PF) 100 MCG/2ML IJ SOLN
INTRAMUSCULAR | Status: AC
  Filled 2018-10-16: qty 2

## 2018-10-16 MED ORDER — SODIUM CHLORIDE 0.9 % IV SOLN: 250.0000 mL | INTRAVENOUS | Status: DC

## 2018-10-16 MED ORDER — LIDOCAINE 2% (20 MG/ML) 5 ML SYRINGE
INTRAMUSCULAR | Status: AC
  Filled 2018-10-16: qty 5

## 2018-10-16 MED ORDER — ONDANSETRON HCL 4 MG/2ML IJ SOLN
INTRAMUSCULAR | Status: DC | PRN
  Administered 2018-10-16: 4 mg via INTRAVENOUS

## 2018-10-16 MED ORDER — FENTANYL CITRATE (PF) 250 MCG/5ML IJ SOLN
INTRAMUSCULAR | Status: DC | PRN
  Administered 2018-10-16 (×3): 50 ug via INTRAVENOUS
  Administered 2018-10-16: 100 ug via INTRAVENOUS

## 2018-10-16 MED ORDER — SODIUM CHLORIDE 0.9% FLUSH
3.0000 mL | Freq: Two times a day (BID) | INTRAVENOUS | Status: DC
  Administered 2018-10-16 – 2018-10-24 (×8): 3 mL via INTRAVENOUS

## 2018-10-16 MED ORDER — PHENYLEPHRINE 40 MCG/ML (10ML) SYRINGE FOR IV PUSH (FOR BLOOD PRESSURE SUPPORT)
PREFILLED_SYRINGE | INTRAVENOUS | Status: AC
  Filled 2018-10-16: qty 10

## 2018-10-16 MED ORDER — DEXAMETHASONE SODIUM PHOSPHATE 10 MG/ML IJ SOLN
INTRAMUSCULAR | Status: DC | PRN
  Administered 2018-10-16: 10 mg via INTRAVENOUS

## 2018-10-16 MED ORDER — EPHEDRINE SULFATE-NACL 50-0.9 MG/10ML-% IV SOSY
PREFILLED_SYRINGE | INTRAVENOUS | Status: DC | PRN
  Administered 2018-10-16 (×5): 10 mg via INTRAVENOUS

## 2018-10-16 MED ORDER — GENERIC EXTERNAL MEDICATION
Status: DC
Start: ? — End: 2018-10-16

## 2018-10-16 MED ORDER — ONDANSETRON HCL 4 MG/2ML IJ SOLN
INTRAMUSCULAR | Status: AC
  Filled 2018-10-16: qty 2

## 2018-10-16 MED ORDER — ENOXAPARIN SODIUM 40 MG/0.4ML ~~LOC~~ SOLN
40.0000 mg | SUBCUTANEOUS | Status: DC
  Administered 2018-10-17 – 2018-10-25 (×9): 40 mg via SUBCUTANEOUS
  Filled 2018-10-16 (×9): qty 0.4

## 2018-10-16 MED ORDER — POTASSIUM CHLORIDE CRYS ER 20 MEQ PO TBCR: 40.0000 meq | EXTENDED_RELEASE_TABLET | Freq: Once | ORAL | Status: DC

## 2018-10-16 MED ORDER — MIDAZOLAM HCL 2 MG/2ML IJ SOLN
INTRAMUSCULAR | Status: AC
  Filled 2018-10-16: qty 2

## 2018-10-16 MED ORDER — SUCCINYLCHOLINE CHLORIDE 20 MG/ML IJ SOLN
INTRAMUSCULAR | Status: DC | PRN
  Administered 2018-10-16: 120 mg via INTRAVENOUS

## 2018-10-16 MED ORDER — EPINEPHRINE 1 MG/10ML IJ SOSY
PREFILLED_SYRINGE | INTRAMUSCULAR | Status: AC
  Filled 2018-10-16: qty 10

## 2018-10-16 MED ORDER — SODIUM CHLORIDE 0.9 % IV SOLN
INTRAVENOUS | Status: DC | PRN
  Administered 2018-10-16 (×2): 30 ug/min via INTRAVENOUS

## 2018-10-16 MED ORDER — ROCURONIUM BROMIDE 10 MG/ML (PF) SYRINGE
PREFILLED_SYRINGE | INTRAVENOUS | Status: DC | PRN
  Administered 2018-10-16: 20 mg via INTRAVENOUS
  Administered 2018-10-16: 60 mg via INTRAVENOUS

## 2018-10-16 MED ORDER — LIDOCAINE HCL (PF) 1 % IJ SOLN
INTRAMUSCULAR | Status: DC | PRN
  Administered 2018-10-16: 30 mL

## 2018-10-16 MED ORDER — EPHEDRINE 5 MG/ML INJ
INTRAVENOUS | Status: AC
  Filled 2018-10-16: qty 10

## 2018-10-16 MED ORDER — SODIUM CHLORIDE 0.9 % IV SOLN
INTRAVENOUS | Status: AC
  Filled 2018-10-16: qty 1.2

## 2018-10-16 MED ORDER — ACETAMINOPHEN 325 MG PO TABS
325.0000 mg | ORAL_TABLET | ORAL | Status: DC | PRN
  Administered 2018-10-19 – 2018-10-24 (×5): 650 mg via ORAL
  Filled 2018-10-16 (×2): qty 2
  Filled 2018-10-16: qty 1
  Filled 2018-10-16 (×4): qty 2

## 2018-10-16 MED ORDER — CEFAZOLIN SODIUM 1 G IJ SOLR
INTRAMUSCULAR | Status: AC
  Filled 2018-10-16: qty 20

## 2018-10-16 MED ORDER — SUGAMMADEX SODIUM 200 MG/2ML IV SOLN
INTRAVENOUS | Status: DC | PRN
  Administered 2018-10-16: 200 mg via INTRAVENOUS

## 2018-10-16 MED ORDER — DEXAMETHASONE SODIUM PHOSPHATE 10 MG/ML IJ SOLN
INTRAMUSCULAR | Status: AC
  Filled 2018-10-16: qty 1

## 2018-10-16 MED ORDER — CHLORHEXIDINE GLUCONATE 4 % EX LIQD
60.0000 mL | Freq: Once | CUTANEOUS | Status: DC
  Filled 2018-10-16: qty 60

## 2018-10-16 MED ORDER — CEFAZOLIN SODIUM-DEXTROSE 1-4 GM/50ML-% IV SOLN
1.0000 g | Freq: Four times a day (QID) | INTRAVENOUS | Status: AC
  Administered 2018-10-17 (×3): 1 g via INTRAVENOUS
  Filled 2018-10-16 (×3): qty 50

## 2018-10-16 MED ORDER — MAGNESIUM SULFATE 2 GM/50ML IV SOLN
2.0000 g | Freq: Once | INTRAVENOUS | Status: DC
  Filled 2018-10-16: qty 50

## 2018-10-16 MED ORDER — SODIUM CHLORIDE 0.9 % IV SOLN
80.0000 mg | INTRAVENOUS | Status: DC
  Filled 2018-10-16: qty 2

## 2018-10-16 MED ORDER — FENTANYL CITRATE (PF) 100 MCG/2ML IJ SOLN
25.0000 ug | INTRAMUSCULAR | Status: DC | PRN
  Administered 2018-10-16 (×3): 50 ug via INTRAVENOUS

## 2018-10-16 MED ORDER — SODIUM CHLORIDE 0.9 % IV SOLN: INTRAVENOUS | Status: DC

## 2018-10-16 MED ORDER — SODIUM CHLORIDE 0.9% FLUSH: 3.0000 mL | INTRAVENOUS | Status: DC | PRN

## 2018-10-16 MED ORDER — PROTAMINE SULFATE 10 MG/ML IV SOLN
INTRAVENOUS | Status: AC
  Filled 2018-10-16: qty 5

## 2018-10-16 MED ORDER — ONDANSETRON HCL 4 MG/2ML IJ SOLN: 4.0000 mg | Freq: Four times a day (QID) | INTRAMUSCULAR | Status: DC | PRN

## 2018-10-16 MED ORDER — LIDOCAINE 2% (20 MG/ML) 5 ML SYRINGE
INTRAMUSCULAR | Status: DC | PRN
  Administered 2018-10-16: 60 mg via INTRAVENOUS

## 2018-10-16 MED ORDER — SODIUM CHLORIDE 0.9 % IV SOLN
INTRAVENOUS | Status: DC | PRN
  Administered 2018-10-16: 500 mL

## 2018-10-16 MED ORDER — HEPARIN SODIUM (PORCINE) 1000 UNIT/ML IJ SOLN
INTRAMUSCULAR | Status: AC
  Filled 2018-10-16: qty 1

## 2018-10-16 MED ORDER — PROPOFOL 1000 MG/100ML IV EMUL
INTRAVENOUS | Status: AC
  Filled 2018-10-16: qty 100

## 2018-10-16 MED ORDER — PHENYLEPHRINE 40 MCG/ML (10ML) SYRINGE FOR IV PUSH (FOR BLOOD PRESSURE SUPPORT)
PREFILLED_SYRINGE | INTRAVENOUS | Status: DC | PRN
  Administered 2018-10-16: 120 ug via INTRAVENOUS
  Administered 2018-10-16 (×2): 80 ug via INTRAVENOUS

## 2018-10-16 MED ORDER — PROPOFOL 10 MG/ML IV BOLUS
INTRAVENOUS | Status: DC | PRN
  Administered 2018-10-16: 120 mg via INTRAVENOUS

## 2018-10-16 MED ORDER — PROPOFOL 10 MG/ML IV BOLUS
INTRAVENOUS | Status: AC
  Filled 2018-10-16: qty 20

## 2018-10-16 SURGICAL SUPPLY — 42 items
BAG BANDED W/RUBBER/TAPE 36X54 (MISCELLANEOUS) ×4 IMPLANT
BLADE 10 SAFETY STRL DISP (BLADE) ×4 IMPLANT
BLADE CORE FAN STRYKER (BLADE) ×4 IMPLANT
BLADE OSCILLATING /SAGITTAL (BLADE) IMPLANT
BLADE STERNUM SYSTEM 6 (BLADE) ×4 IMPLANT
BNDG COHESIVE 4X5 WHT NS (GAUZE/BANDAGES/DRESSINGS) IMPLANT
CANISTER SUCT 3000ML PPV (MISCELLANEOUS) ×4 IMPLANT
COIL ONE TIE COMPRESSION (VASCULAR PRODUCTS) ×4 IMPLANT
COVER BACK TABLE 60X90IN (DRAPES) ×4 IMPLANT
COVER DOME SNAP 22 D (MISCELLANEOUS) ×4 IMPLANT
DEVICE LCKNG LEAD CARDIAC (CATHETERS) ×2 IMPLANT
DRAPE CARDIOVASCULAR INCISE (DRAPES) ×2
DRAPE INCISE IOBAN 66X45 STRL (DRAPES) ×8 IMPLANT
DRAPE SRG 135X102X78XABS (DRAPES) ×2 IMPLANT
DRSG OPSITE 6X11 MED (GAUZE/BANDAGES/DRESSINGS) IMPLANT
DRSG TEGADERM 4X4.75 (GAUZE/BANDAGES/DRESSINGS) ×8 IMPLANT
ELECT REM PT RETURN 9FT ADLT (ELECTROSURGICAL) ×8
ELECTRODE REM PT RTRN 9FT ADLT (ELECTROSURGICAL) ×4 IMPLANT
GAUZE 4X4 16PLY RFD (DISPOSABLE) ×12 IMPLANT
GAUZE SPONGE 4X4 12PLY STRL (GAUZE/BANDAGES/DRESSINGS) ×8 IMPLANT
GLOVE BIOGEL PI IND STRL 7.5 (GLOVE) ×2 IMPLANT
GLOVE BIOGEL PI INDICATOR 7.5 (GLOVE) ×2
GLOVE ECLIPSE 8.0 STRL XLNG CF (GLOVE) ×4 IMPLANT
GOWN STRL REUS W/ TWL LRG LVL3 (GOWN DISPOSABLE) IMPLANT
GOWN STRL REUS W/ TWL XL LVL3 (GOWN DISPOSABLE) ×2 IMPLANT
GOWN STRL REUS W/TWL LRG LVL3 (GOWN DISPOSABLE)
GOWN STRL REUS W/TWL XL LVL3 (GOWN DISPOSABLE) ×2
KIT TURNOVER KIT B (KITS) ×4 IMPLANT
NEEDLE PERC 18GX7CM (NEEDLE) ×4 IMPLANT
PAD ARMBOARD 7.5X6 YLW CONV (MISCELLANEOUS) ×8 IMPLANT
PAD ELECT DEFIB RADIOL ZOLL (MISCELLANEOUS) ×4 IMPLANT
REMOVAL LLD CARDIAC LEAD EZ (CATHETERS) ×4
SHEATH EVOLUTION RL 11F (SHEATH) ×4 IMPLANT
SHEATH EVOLUTION SHORTE RL 11F (SHEATH) ×4 IMPLANT
STYLET LIBERATOR LOCKING (MISCELLANEOUS) ×8 IMPLANT
SUT PROLENE 2 0 SH DA (SUTURE) IMPLANT
TAPE CLOTH SURG 4X10 WHT LF (GAUZE/BANDAGES/DRESSINGS) ×4 IMPLANT
TOWEL OR 17X26 10 PK STRL BLUE (TOWEL DISPOSABLE) ×4 IMPLANT
TRAY FOLEY SLVR 16FR TEMP STAT (SET/KITS/TRAYS/PACK) ×4 IMPLANT
TUBE CONNECTING 12'X1/4 (SUCTIONS)
TUBE CONNECTING 12X1/4 (SUCTIONS) IMPLANT
YANKAUER SUCT BULB TIP NO VENT (SUCTIONS) IMPLANT

## 2018-10-16 NOTE — Anesthesia Postprocedure Evaluation (Signed)
Anesthesia Post Note  Patient: Henry Mcpherson  Procedure(s) Performed: PACEMAKER EXTRACTION (N/A Chest) TRANSESOPHAGEAL ECHOCARDIOGRAM (TEE) (N/A )     Patient location during evaluation: PACU Anesthesia Type: General Level of consciousness: awake and alert Pain management: pain level controlled Vital Signs Assessment: post-procedure vital signs reviewed and stable Respiratory status: spontaneous breathing, nonlabored ventilation, respiratory function stable and patient connected to nasal cannula oxygen Cardiovascular status: blood pressure returned to baseline and stable Postop Assessment: no apparent nausea or vomiting Anesthetic complications: no    Last Vitals:  Vitals:   10/16/18 2136 10/16/18 2137  BP: 113/75 113/75  Pulse: 81 86  Resp: 16 16  Temp: 36.4 C 36.4 C  SpO2: 100% 100%    Last Pain:  Vitals:   10/16/18 2137  TempSrc: Oral  PainSc:                  Catalina Gravel

## 2018-10-16 NOTE — Transfer of Care (Signed)
Immediate Anesthesia Transfer of Care Note  Patient: Henry Mcpherson  Procedure(s) Performed: PACEMAKER EXTRACTION (N/A Chest) TRANSESOPHAGEAL ECHOCARDIOGRAM (TEE) (N/A )  Patient Location: PACU  Anesthesia Type:General  Level of Consciousness: awake, alert  and oriented  Airway & Oxygen Therapy: Patient Spontanous Breathing  Post-op Assessment: Report given to RN and Post -op Vital signs reviewed and stable  Post vital signs: Reviewed and stable  Last Vitals:  Vitals Value Taken Time  BP 93/31 10/16/18 1949  Temp    Pulse    Resp 17 10/16/18 1951  SpO2    Vitals shown include unvalidated device data.  Last Pain:  Vitals:   10/16/18 1217  TempSrc:   PainSc: 8       Patients Stated Pain Goal: 0 (96/75/91 6384)  Complications: No apparent anesthesia complications

## 2018-10-16 NOTE — Progress Notes (Signed)
PROGRESS NOTE  Henry Mcpherson MQK:[id number] DOB: October 26, 1955 DOA: 10/10/2018 PCP: System, Pcp Not In   LOS: 6 days   Patient is from: Home  Brief Narrative / Interim history: 63 y.o. male with medical history significant ofsCHF with EF 45%, A. 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 due to back pain, bilateral leg weakness, foot infection, MRSA bacteremia.  Patient had surgical resection of left foot by podiatry at OSH.  On day 11 of vancomycin on day of transfer.  Repeat blood culture reportedly negative.  He states that he continues to have back pain, bilateral leg weakness, difficulty walking.  He states that he has decreased sensation in both legs which he attributes to diabetic neuropathy.  Denies new bowel or bladder issues other than usual poor bowel control.  Reportedly had lumbar X-ray 3 weeks ago, and finding was concerning for possible infection as per Dr. Windy Carina review.  On arrival here, hemodynamically stable.  Hemoglobin 7.8 (about baseline).  Electrolytes not impressive.  COVID-19 negative.  Per admitting provider, Dr. Ellene Route from neurosurgery consulted on arrival.  CT lumbar spine showed superior migration of left L4 screw, loosening of left L3 screw, discitis at T12-L1 with regional osteomyelitis and possible discitis at L3-4 and L5-S1.  Patient had IR fluoroscopy guided biopsy of the thoracolumbar discitis on 10/12/2018.  Cultures were sent.  He also had TTE which did not show vegetation.  Underwent TEE on 10/13/2018  Subjective: No major events overnight of this morning.  No complaints other than the usual bowel pain.  No new focal neuro symptoms.  Denies any GU symptoms.  Assessment & Plan: MRSA bacteremia: Likely from left foot infection. -Infectious  disease following. -ESR and CRP 50/1.5 respectively. -Repeat blood culture at OSH reportedly negative  -Repeat blood culture on 10/11/2018 here negative so far. -Continue IV vancomycin-started at OSH 11 days prior to transfer -Rifampin added per ID on 6/10 -Plan for 6 to 8 weeks of IV antibiotics given discitis and osteomyelitis. -Trend leukocytosis -We will request PICC line placement on 6/17.  Possible pacemaker lead vegetation versus thrombus -TEE showed 2.1 x 0.4 cm lesion in right atrium attached to the pacemaker lead -Plan for pacemaker extraction today. -Placement to be done at a later time by his primary EP cardiologist since he is not pacemaker dependent now.  Possible thoracolumbar discitis and regional osteomyelitis -Status post IR fluoroscopy guided biopsy of the discitis on 10/12/2018 -Tissue culture including fungal cultures negative but this is about 12 days after he started antibiotic. -Follow AFB culture.  AFB smear negative. -Plan for 6 to 8 weeks of IV antibiotic  History of cauda equina syndrome and spinal stenosis of lumbar region:  -s/p of lumbar decompression and fusion by Dr. Saintclair Halsted on 3/4.   -CT lumbar spine concerning for loosening of the screws, discitis and osteomyelitis as above -IR fluoroscopy guided biopsy of the discitis on 10/12/2018-follow cultures. -Neurosurgery on board-sent secure chat to Dr. Saintclair Halsted and Dr. Toya Smothers for clarification on further plan and PT -Antibiotic as above -PRN Percocet and Flexeril abdominal for pain. -We will clarify about PT/OT with neurosurgery given loosening of the screws.  Left foot infection-great toe: s/p of amputation by podiatry at outside hospital.  -Antibiotic as above -Appreciate wound care input  Chronic obstructive lung disease ([place]): stable. -prn albuterol nebs  Controlled IDDM-2 with complications, diabetic foot infection: on  NovoLog and Levemir 20 units at home.  A1c 6.2.  CBG within appropriate range.  -SSI -Levemir on hold  Essential hypertension: Normotensive -IV Hydralazine prn -Metoprolol on hold by cardiology  Chronic diastolic CHF:  TTE on 2/99 with EF of 55 to 60% (improved), moderate LVH, indeterminate DD, moderately reduced RVSP no vegetation.  On 07/05/2018 showed EF 45-50%.  No cardiopulmonary symptoms.  Heart slightly worsening dyspnea although weight is stable.  Wonder if this is third spacing. -Check albumin level -We will increase home Lasix to 40 mg twice daily. -Daily weight, intake output and renal function -Monitor electrolytes and replenish  Permanent atrial Fibrillation: Rate controlled with metoprolol.  CHA2DS2-VASc Score is 5 but not on AC.  Now he has possible GIB with stool guaiac positive.  -Metoprolol and digoxin on hold by cardiology -Closely monitor electrolytes and replenish aggressively.  CAD (coronary artery disease): s/p of stent and CABG. No Cp. -Continue lipitor -Metoprolol on hold by cardiology.  Chronic blood loss anemia and anemia of chronic disease:  FOBT positive.  Hb 8.2 (about baseline).  -protonix 40 bid -Monitor H&H  Stage II pressure injuries over his left buttock: POA -Appreciate wound care input.  Scheduled Meds: . acidophilus  1 capsule Oral Daily  . atorvastatin  40 mg Oral Daily  . chlorhexidine  60 mL Topical Once  . chlorhexidine  60 mL Topical Once  . furosemide  40 mg Oral Daily  . gentamicin irrigation  80 mg Irrigation On Call  . insulin aspart  0-9 Units Subcutaneous TID WC  . nicotine  14 mg Transdermal Daily  . pantoprazole  40 mg Oral BID  . rifampin  300 mg Oral Q8H  . sodium chloride flush  3 mL Intravenous Q12H   Continuous Infusions: . sodium chloride Stopped (10/11/18 0249)  . sodium chloride    . sodium chloride    .  ceFAZolin (ANCEF) IV    . vancomycin 1,000 mg (10/15/18 2210)   PRN Meds:.sodium chloride, acetaminophen, albuterol, cyclobenzaprine, hydrALAZINE, ondansetron (ZOFRAN) IV,  oxyCODONE-acetaminophen, sodium chloride flush   DVT prophylaxis: SCD given chronic GI bleed/FOBT positive Code Status: Full code Family Communication: Updated patient's wife over the phone on 6/12 Disposition Plan: Remains inpatient for MRSA bacteremia with pacemaker vegetation, discitis, osteomyelitis and diabetic left foot wound  Consultants:   Neurosurgery  Infectious disease  EP/cardiology  IR  Procedures:   IR fluoroscopy guided biopsy of thoracolumbar discitis on 6/11  TEE on 6/12  Pacemaker extraction on 6/15.  Microbiology: . Initial blood culture at outside hospital reportedly MRSA . Repeat blood cultures outside hospital reportedly negative . Blood culture on 10/10/2018-negative so far . MRSA PCR negative . COVID-19 negative . Tissue cultures negative so far . Tissue fungal cultures negative so far . Tissue AFB culture pending  Antimicrobials: Anti-infectives (From admission, onward)   Start     Dose/Rate Route Frequency Ordered Stop   10/16/18 0845  gentamicin (GARAMYCIN) 80 mg in sodium chloride 0.9 % 500 mL irrigation     80 mg Irrigation On call 10/16/18 0832 10/17/18 0845   10/16/18 0845  ceFAZolin (ANCEF) IVPB 2g/100 mL premix     2 g 200 mL/hr over 30 Minutes Intravenous On call 10/16/18 0832 10/17/18 0845   10/11/18 1400  rifampin (RIFADIN) capsule 300 mg     300 mg Oral Every 8 hours 10/11/18 1100     10/10/18 2130  vancomycin (VANCOCIN) IVPB 1000 mg/200  mL premix     1,000 mg 200 mL/hr over 60 Minutes Intravenous Every 24 hours 10/10/18 2126        Objective: Vitals:   10/15/18 1758 10/15/18 2043 10/16/18 0712 10/16/18 0940  BP: 135/81 132/72 (!) 145/77 131/84  Pulse: 65 72 90 72  Resp: _0 Temp: 98.2 F (36.8 C) 98.3 F (36.8 C) (!) 97.5 F (36.4 C) 97.6 F (36.4 C)  TempSrc: Oral Oral Oral Oral  SpO2: 98% 100% 100% 98%  Weight:   86.4 kg   Height:        Intake/Output Summary (Last 24 hours) at 10/16/2018 1157 Last  data filed at 10/16/2018 1111 Gross per 24 hour  Intake 950 ml  Output 1125 ml  Net -175 ml   Filed Weights   10/13/18 2029 10/15/18 0600 10/16/18 0712  Weight: 86 kg 88 kg 86.4 kg    Examination:.  GENERAL: No acute distress.  Appears well.  HEENT: MMM.  Vision and hearing grossly intact.  NECK: Supple.  No apparent JVD. LUNGS:  No IWOB.  Fair air movement bilaterally. HEART:  RRR. Heart sounds normal.  ABD: Bowel sounds present. Soft. Non tender.  MSK/EXT:  Moves all extremities. No apparent deformity.  2+ pitting edema in all extremities. SKIN: Stage II pressure skin injuries over right buttock as below. NEURO: Awake, alert and oriented appropriately.  Backslash 5 in both lower extremities.  Light sensation diminished but symmetric.  Patellar reflexes symmetric. PSYCH: Calm. Normal affect.  Picture on 10/12/2018    Data Reviewed: I have independently reviewed following labs and imaging studies  CBC: Recent Labs  Lab 10/10/18 2004 10/11/18 1224 10/12/18 0552 10/14/18 0716 10/16/18 0931  WBC 15.8* 11.2* 13.2* 11.6* 12.0*  HGB 9.0* 8.4* 8.2* 8.0* 9.3*  HCT 30.0* 27.8* 28.1* 26.3* 31.7*  MCV 86.7 86.9 87.5 85.9 87.3  PLT 379 342 402* 350 497   Basic Metabolic Panel: Recent Labs  Lab 10/11/18 0611 10/13/18 2113 10/14/18 0716 10/15/18 0735 10/16/18 0931  NA 140 138 137 139 138  K 3.2* 3.3* 3.1* 3.4* 3.5  CL 105 104 104 105 104  CO2 _1 GLUCOSE 128* 174* 146* 105* 179*  BUN _2 CREATININE 0.63 0.92 0.69 0.71 0.66  CALCIUM 8.5* 8.0* 8.1* 8.3* 8.9  MG  --   --   --  1.2* 1.7   GFR: Estimated Creatinine Clearance: 107 mL/min (by C-G formula based on SCr of 0.66 mg/dL). Liver Function Tests: No results for input(s): AST, ALT, ALKPHOS, BILITOT, PROT, ALBUMIN in the last 168 hours. No results for input(s): LIPASE, AMYLASE in the last 168 hours. No results for input(s): AMMONIA in the last 168 hours. Coagulation Profile: Recent Labs   Lab 10/11/18 0611  INR 1.2   Cardiac Enzymes: No results for input(s): CKTOTAL, CKMB, CKMBINDEX, TROPONINI in the last 168 hours. BNP (last 3 results) No results for input(s): PROBNP in the last 8760 hours. HbA1C: No results for input(s): HGBA1C in the last 72 hours. CBG: Recent Labs  Lab 10/15/18 1133 10/15/18 1642 10/15/18 2041 10/16/18 0713 10/16/18 1119  GLUCAP 169* 138* 126* 124* 132*   Lipid Profile: No results for input(s): CHOL, HDL, LDLCALC, TRIG, CHOLHDL, LDLDIRECT in the last 72 hours. Thyroid Function Tests: No results for input(s): TSH, T4TOTAL, FREET4, T3FREE, THYROIDAB in the last 72 hours. Anemia Panel: No results for input(s): VITAMINB12, FOLATE, FERRITIN, TIBC, IRON, RETICCTPCT in the last  72 hours. Urine analysis:    Component Value Date/Time   COLORURINE YELLOW 07/08/2018 0224   APPEARANCEUR CLEAR 07/08/2018 0224   LABSPEC 1.011 07/08/2018 0224   PHURINE 6.0 07/08/2018 0224   GLUCOSEU NEGATIVE 07/08/2018 0224   HGBUR SMALL (A) 07/08/2018 0224   BILIRUBINUR NEGATIVE 07/08/2018 0224   KETONESUR NEGATIVE 07/08/2018 0224   PROTEINUR NEGATIVE 07/08/2018 0224   NITRITE NEGATIVE 07/08/2018 0224   LEUKOCYTESUR SMALL (A) 07/08/2018 0224   Sepsis Labs: Invalid input(s): PROCALCITONIN, LACTICIDVEN  Recent Results (from the past 240 hour(s))  Culture, blood (Routine X 2) w Reflex to ID Panel     Status: None   Collection Time: 10/10/18  7:55 PM   Specimen: BLOOD  Result Value Ref Range Status   Specimen Description BLOOD LEFT ARM  Final   Special Requests   Final    BOTTLES DRAWN AEROBIC ONLY Blood Culture adequate volume   Culture   Final    NO GROWTH 5 DAYS Performed at [facility], [address], [place], [place] [address]    Report Status 10/15/2018 FINAL  Final  Culture, blood (Routine X 2) w Reflex to ID Panel     Status: None   Collection Time: 10/10/18  8:00 PM   Specimen: BLOOD  Result Value Ref Range Status   Specimen Description  BLOOD LEFT ARM  Final   Special Requests   Final    BOTTLES DRAWN AEROBIC ONLY Blood Culture adequate volume   Culture   Final    NO GROWTH 5 DAYS Performed at [facility], [place] [address] [place], [place] [address]    Report Status 10/15/2018 FINAL  Final  Surgical pcr screen     Status: None   Collection Time: 10/11/18 12:21 AM   Specimen: Nasal Mucosa; Nasal Swab  Result Value Ref Range Status   MRSA, PCR NEGATIVE NEGATIVE Final   Staphylococcus aureus NEGATIVE NEGATIVE Final    Comment: (NOTE) The Xpert SA Assay (FDA approved for NASAL specimens in patients 25 years of age and older), is one component of a comprehensive surveillance program. It is not intended to diagnose infection nor to guide or monitor treatment. Performed at [facility], [place] [address], [place], [place] [address]   SARS Coronavirus 2     Status: None   Collection Time: 10/11/18 12:23 AM  Result Value Ref Range Status   SARS Coronavirus 2 NOT DETECTED NOT DETECTED Final    Comment: (NOTE) SARS-CoV-2 target nucleic acids are NOT DETECTED. The SARS-CoV-2 RNA is generally detectable in upper and lower respiratory specimens during the acute phase of infection.  Negative  results do not preclude SARS-CoV-2 infection, do not rule out co-infections with other pathogens, and should not be used as the sole basis for treatment or other patient management decisions.  Negative results must be combined with clinical observations, patient history, and epidemiological information. The expected result is Not Detected. Fact Sheet for Patients: http://www.biofiredefense.com/wp-content/uploads/2020/03/BIOFIRE-COVID -19-patients.pdf Fact Sheet for Healthcare Providers: http://www.biofiredefense.com/wp-content/uploads/2020/03/BIOFIRE-COVID -19-hcp.pdf This test is not yet approved or cleared by the [place] and  has been authorized for detection and/or diagnosis of SARS-CoV-2 by FDA under an  Emergency Use Authorization (EUA).  This EUA will remain in effec t (meaning this test can be used) for the duration of  the COVID-19 declaration under Section 564(b)(1) of the Act, 21 U.S.C. section 360bbb-3(b)(1), unless the authorization is terminated or revoked sooner. Performed at [facility], [place]  [address], [place], [place] [address]   Body fluid culture     Status: None   Collection Time: 10/12/18 11:15 AM   Specimen: Fluid; Wound  Result Value Ref Range Status   Specimen Description FLUID  Final   Special Requests NONE  Final   Gram Stain   Final    RARE WBC PRESENT, PREDOMINANTLY PMN NO ORGANISMS SEEN    Culture   Final    NO GROWTH Performed at [facility], [place] [address] [place], [place] [address]    Report Status 10/15/2018 FINAL  Final  Acid Fast Smear (AFB)     Status: None   Collection Time: 10/12/18 11:15 AM   Specimen: Fluid; Wound  Result Value Ref Range Status   AFB Specimen Processing Concentration  Final   Acid Fast Smear Negative  Final    Comment: (NOTE) Performed At: [facility] [place], [place] [id number] Rush Farmer MD MA:[id number]    Source (AFB) FLUID  Final    Comment: Performed at [facility], [place] [address], [place], [place] [address]  Culture, fungus without smear     Status: None (Preliminary result)   Collection Time: 10/12/18 11:15 AM   Specimen: Fluid  Result Value Ref Range Status   Specimen Description FLUID  Final   Special Requests NONE  Final   Culture   Final    NO FUNGUS ISOLATED AFTER 3 DAYS Performed at [place] [address], [place], [place] [address]    Report Status PENDING  Incomplete  Surgical PCR screen     Status: None   Collection Time: 10/16/18  8:33 AM   Specimen: Nasal Mucosa; Nasal Swab  Result Value Ref Range Status   MRSA, PCR NEGATIVE NEGATIVE Final   Staphylococcus aureus NEGATIVE NEGATIVE Final    Comment: (NOTE) The Xpert SA Assay (FDA  approved for NASAL specimens in patients 32 years of age and older), is one component of a comprehensive surveillance program. It is not intended to diagnose infection nor to guide or monitor treatment. Performed at [facility], [place] [address] [place], [place] [address]       Radiology Studies: No results found.  Nikita Surman [facility] Pager [phone]  If 7PM-7AM, please contact night-coverage [email or web address] Password [facility] 10/16/2018, 11:57 AM

## 2018-10-16 NOTE — CV Procedure (Signed)
EP Procedure Note  Procedure performed: Extraction of a DDD Pacing system.  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 The anesthesia service was utilized to provide general endotracheal anesthesia. An art line was placed for invasive hemodynamic monitoring in the left radial artery. Also, the TEE probe was placed in the esophagus to monitor the cardiac chambers and the pericardium.   After the usual preparation and draping, a 6 french sheath was placed in the right femoral vein and used for IV infusion. A 5 cm incision was carried out over the left infraclavicular region at the site of the old PPM generator. Electrocautery was used to free up the generator and it was removed. Attention was turned to the leads which were freed up from there suture sleeve. A stylet was inserted into the atrial lead and the helix retracted. The [employer] locking stylet was then inserted after the lead was cut and locked in place near the tip of the atrial lead. The Cook 11 french short sheath was advanced over the lead to the junction of the left subclavian and innominate vein. Next the short sheath removed and the long Cook 11 french sheath advanced over the lead. Using a combination of traction, counter traction, pressure and counter pressure, the atrial lead was removed in total. There was no hemodynamic sequelae.  Attention was then turned to the RV lead. Initial attempts to advance the stylet down the lead body were unsuccessful. The lead was cut and a [employer] locking stylet was advanced into the RV lead down to the Innominate vein. The cook short sheath and then [facility] long sheath were then used and advanced down the body of the RV lead to the SVC junction. With gentle traction, the RV lead was removed in total. The  pocket was irrigated, hemostasis was assured by holding manual pressure for 5 minutes and the incision was closed with interrupted 2-0 mattress sutures.  The patient was returned to the recovery area in stable condition.  Complications: none immediately  Conclusion: successful extraction of a [employer] DDD PM in a patient with MRSA lead endocarditis.  Mikle Bosworth.D.

## 2018-10-16 NOTE — Progress Notes (Signed)
Messaged on call [phone] 78M 12 pt had 5 beat pattern of idioventricular response at 1:26am, 1:30am 1.21 sec pause, asymptomatic, vitals WNL, AICD was reset by Dr. Joylene Grapes and he will see in am

## 2018-10-16 NOTE — Progress Notes (Signed)
Orthopedic Tech Progress Note Patient Details:  Henry Mcpherson 12-04-1955 [id number]  Ortho Devices Type of Ortho Device: Arm sling Ortho Device/Splint Location: lue Ortho Device/Splint Interventions: Ordered, Application, Adjustment   Post Interventions Patient Tolerated: Well Instructions Provided: Care of device, Adjustment of device   Karolee Stamps 10/16/2018, 8:23 PM

## 2018-10-16 NOTE — Progress Notes (Addendum)
Progress Note  Patient Name: Henry Mcpherson Date of Encounter: 10/16/2018  Primary Cardiologist: Dr. Alden Server ([place], [place])  Subjective   No CP or SOB  Inpatient Medications    Scheduled Meds: . acidophilus  1 capsule Oral Daily  . atorvastatin  40 mg Oral Daily  . chlorhexidine  60 mL Topical Once  . chlorhexidine  60 mL Topical Once  . furosemide  40 mg Oral Daily  . gentamicin irrigation  80 mg Irrigation On Call  . insulin aspart  0-9 Units Subcutaneous TID WC  . nicotine  14 mg Transdermal Daily  . pantoprazole  40 mg Oral BID  . rifampin  300 mg Oral Q8H  . sodium chloride flush  3 mL Intravenous Q12H   Continuous Infusions: . sodium chloride Stopped (10/11/18 0249)  . sodium chloride    . sodium chloride    .  ceFAZolin (ANCEF) IV    . vancomycin 1,000 mg (10/15/18 2210)   PRN Meds: sodium chloride, acetaminophen, albuterol, cyclobenzaprine, hydrALAZINE, ondansetron (ZOFRAN) IV, oxyCODONE-acetaminophen, sodium chloride flush   Vital Signs    Vitals:   10/15/18 0924 10/15/18 1758 10/15/18 2043 10/16/18 0712  BP: 115/73 135/81 132/72 (!) 145/77  Pulse: 69 65 72 90  Resp: 18 18 16 16   Temp: 97.8 F (36.6 C) 98.2 F (36.8 C) 98.3 F (36.8 C) (!) 97.5 F (36.4 C)  TempSrc: Oral Oral Oral Oral  SpO2: 99% 98% 100% 100%  Weight:    86.4 kg  Height:        Intake/Output Summary (Last 24 hours) at 10/16/2018 0904 Last data filed at 10/16/2018 0600 Gross per 24 hour  Intake 950 ml  Output 1025 ml  Net -75 ml   Last 3 Weights 10/16/2018 10/15/2018 10/13/2018  Weight (lbs) 190 lb 7.6 oz 194 lb 0.1 oz 189 lb 9.5 oz  Weight (kg) 86.4 kg 88 kg 86 kg      Telemetry    Looks like SR, PACs, he has PVCs intermittently, infrequent NSVT, rare Paced beat - Personally Reviewed  ECG    No new EKGs - Personally Reviewed  Physical Exam   GEN: No acute distress, appears chronically ill Neck: No JVD Cardiac: RRR, no murmurs, rubs, or gallops.   Respiratory: Clear to auscultation bilaterally. GI: not examined  MS: 1+ edema and chronic looking skin changes b/l LE, L foot is dressed         L chest PPM site is stable, no evidence of infection Neuro:  Nonfocal  Psych: Normal affect   Labs    Chemistry Recent Labs  Lab 10/13/18 2113 10/14/18 0716 10/15/18 0735  NA 138 137 139  K 3.3* 3.1* 3.4*  CL 104 104 105  CO2 24 25 26   GLUCOSE 174* 146* 105*  BUN 19 18 16   CREATININE 0.92 0.69 0.71  CALCIUM 8.0* 8.1* 8.3*  GFRNONAA >60 >60 >60  GFRAA >60 >60 >60  ANIONGAP 10 8 8      Hematology Recent Labs  Lab 10/11/18 0611 10/12/18 0552 10/14/18 0716  WBC 11.2* 13.2* 11.6*  RBC 3.20* 3.21* 3.06*  HGB 8.4* 8.2* 8.0*  HCT 27.8* 28.1* 26.3*  MCV 86.9 87.5 85.9  MCH 26.3 25.5* 26.1  MCHC 30.2 29.2* 30.4  RDW 17.4* 17.6* 17.6*  PLT 342 402* 350    Cardiac EnzymesNo results for input(s): TROPONINI in the last 168 hours. No results for input(s): TROPIPOC in the last 168 hours.   BNP Recent Labs  Lab 10/10/18 2004  BNP 500.2*     DDimer No results for input(s): DDIMER in the last 168 hours.   Radiology    No results found.  Cardiac Studies   10/13/2018: TEE FINDINGS:  Vegetation vs thrombus on pacemaker lead. No valvular vegetations.  Moderate mitral regurgitation with systolic blunting of pulmonary vein Doppler.  Mild tricuspid regurgitation.   Aortic atherosclerosis No PFO. Valsalva not performed due to RA vegatation/thrombus on lead.    Patient Profile     63 y.o. male with PMHx of chronic CHF (systolic), AFib (described as permanent), tachy-brady w/PPM, COPD, DM, PUD, GIB (not on a/c), CAD w/prior CABG, lumbar stenosis w/cauda equina syndrome, s/p lumbar decompression March this year, L great toe infection s/p amputation recently  H&P: Pt [facility] in the past 11 days due to diabetic toe infection. Patient was seen by podiatry at the facility and status post  resection. Blood cultures at the facility were positive for MRSA bacteremia. Patient was treated with 11 days of vancomycin and repeated cultures were noted to be negative. Currently pt does not have fever or chills.  He continues to have left foot pain, which is constant, sharp, 7 out of 10 in severity.Also reported to have been transfused 1 unit of packed red blood cells and stool guaiacs were noted to be positive. Labs note WBC 13.2 (trending down) and hemoglobin 7.8 which is reportedly near his baseline.  Currently patient denies nausea, vomiting, diarrhea or abdominal pain.  Patient states that he had loose stool bowel movement recently, which has resolved completely.  Denies symptoms of UTI.  No facial droop or slurred speech.  Pt states that he underwent lumbar decompression and fusion surgery by Dr. Wynetta Emeryram 3/4 due to cauda equina syndrome. He states that he continues to have back pain, bilateral leg weakness, difficulty walking.  He states that he has decreased sensation in both legs which he attributes to diabetic neuropathy.  Patient does not have incontinence of urine or bowel movement, but feels like he does not have good control of bowel movement. He states that he had lumbar X-ray 3 weeks ago, and finding was concerning for possible infection as per Dr. Lonie Peakram's review (per pt's report).  10/11/18 ID brought on board Patient underwent TEE 10/13/2018 with findings of vegetation vs thrombus on PPM lead Likely  with Hardware Loosening, biopsy negative though had already been on antibiotics, ID reports possibly false negative  Assessment & Plan    1. 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 Tachy-brady syndrome     SJM PPM generator implanted 11/29/15, V lead 05/01/2009 (presumably the A lead as well)     Most recent CXR  07/12/2018 with no evidence of any abandoned leads  Patient is planned for PPM system extraction today by Dr. Ladona Ridgelaylor His pacing rate was turned down to 30, has only had a rare single paced beat since that time. Suspect he will not require temp-perm  [place], [place] His cardiologist is Dr. Blanch MediaBassil, he would like to be able to get back there for re-implantation and management with his attending cardiologist.   I discussed with the patient rational for PPM system extraction, the procedure and it's potential risks and benefits.  He is agreeable to proceed Looks forward to meeting Dr. Ladona Ridgel, will see the patient later this AM  3. AFib, described as permanent     Tele appears sinus with PACs     His home dig and toprol held, has had some faster rates, though mainly in the 60's-80's     CHA2DS2Vasc is 4, not on a/c 2/2 h/o GIB by notes  For questions or updates, please contact [facility] Please consult [email or web address] for contact info under   Signed, Sheilah Pigeon, PA-C  10/16/2018, 9:04 AM    EP Attending  Patient seen and examined. Agree with the findings as noted above. The patient presents with MRSA bacteremia and has been found to have a large vegetation on one of his pacing leads. I have discussed the indications for PPM extraction and he wishes to proceed. The risks/benefits/goals/expectations were removed including but not limited to death. Unfortunately MRSA endocarditis on an indwelling pacing lead is fatal without removal of the pacing system. Now that he is conducting on his own, we do not plan to place a temp PM at this time.   Leonia Reeves.D.

## 2018-10-16 NOTE — Progress Notes (Signed)
Pharmacy Antibiotic Note  Henry Mcpherson is a 63 y.o. male transferred OSH to [facility] on 10/10/2018. 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 Pharmacy has been consulted to resume Vancomycin dosing.  Repeat blood cultures negative thus far. PPM noted to have likely vegetation, rifampin added. Scr 0.66 today. Vancomycin levels reveal therapeutic AUC of 489 mcg*h/ml on last check. Plan for PPM systemc extraction today.   Plan: -Continue vancomycin 1000mg  IV q24h for now -Watch Cr closely, consider levels if Scr changes further or later this week as per protocol   Height: 6\' 2"  (188 cm) Weight: 190 lb 7.6 oz (86.4 kg) IBW/kg (Calculated) : 82.2  Temp (24hrs), Avg:97.9 F (36.6 C), Min:97.5 F (36.4 C), Max:98.3 F (36.8 C)  Recent Labs  Lab 10/10/18 2004 10/10/18 2028 10/11/18 0611 10/12/18 0552 10/12/18 2344 10/13/18 2113 10/14/18 0716 10/15/18 0735 10/16/18 0931  WBC 15.8*  --  11.2* 13.2*  --   --  11.6*  --  12.0*  CREATININE 0.69 0.69 0.63  --   --  0.92 0.69 0.71 0.66  VANCOTROUGH  --   --   --   --   --  16  --   --   --   VANCOPEAK  --   --   --   --  25*  --   --   --   --   VANCORANDOM  --  17  --   --   --   --   --   --   --     Estimated Creatinine Clearance: 107 mL/min (by C-G formula based on SCr of 0.66 mg/dL).    Allergies  Allergen Reactions  . Warfarin And Related Itching and Rash    Antimicrobials this admission: Vanc OSH (started 5/30?) >>  Dose adjustments this admission: 10/13/18: Vp = 25, Vt = 16, AUC = 489 > no adjustments  Microbiology results: OSH BCx >> 2/2 MRSA 6/9 BCx >> NGTD  Thank you for allowing pharmacy to be a part of this patient's care.  Antonietta Jewel, PharmD, BCCCP Clinical Pharmacist  Pager: [phone] Phone: [phone] Please check AMION for all [facility] numbers 10/16/2018

## 2018-10-16 NOTE — Progress Notes (Addendum)
Relayed to day RN Mickel Baas, Rifampin was not received from Pharmacy to give at 0600, Pharmacy was messaged at [phone].

## 2018-10-16 NOTE — Anesthesia Procedure Notes (Signed)
Procedure Name: Intubation Date/Time: 10/16/2018 5:47 PM Performed by: Jearld Pies, CRNA Pre-anesthesia Checklist: Patient identified, Emergency Drugs available, Suction available and Patient being monitored Patient Re-evaluated:Patient Re-evaluated prior to induction Oxygen Delivery Method: Circle System Utilized Preoxygenation: Pre-oxygenation with 100% oxygen Induction Type: IV induction and Rapid sequence Laryngoscope Size: Mac and 4 Grade View: Grade I Tube type: Oral Tube size: 7.5 mm Number of attempts: 1 Airway Equipment and Method: Stylet and Oral airway Placement Confirmation: ETT inserted through vocal cords under direct vision,  positive ETCO2 and breath sounds checked- equal and bilateral Secured at: 23 cm Tube secured with: Tape Dental Injury: Teeth and Oropharynx as per pre-operative assessment

## 2018-10-16 NOTE — Progress Notes (Signed)
CARDIOTHORACIC SURGERY OPERATIVE PROCEDURE BACK UP NOTE  Date of Procedure:   10/16/2018  Surgeon:    Valentina Gu. Roxy Manns, MD   I was requested by Dr. Cristopher Peru to be on standby for the pacemaker explant/lead extraction performed on Mr. Henry Mcpherson on 10/16/2018. I arrived at the operating room at 17:45 and departed at 19:15    Braulio Conte H. Roxy Manns MD 10/16/2018 6:04 PM

## 2018-10-16 NOTE — Progress Notes (Signed)
[facility]   Reason for visit: Follow up on bacteremia  Interval History: PM to be removed today by Dr. Ladona Ridgel; WBC 12, no fever.  Repeat blood cultures remained negative.   Day 7 total antibiotics   Physical Exam: Constitutional:  Vitals:   10/16/18 0712 10/16/18 0940  BP: (!) 145/77 131/84  Pulse: 90 72  Resp: 16 18  Temp: (!) 97.5 F (36.4 C) 97.6 F (36.4 C)  SpO2: 100% 98%   patient appears in NAD Eyes: anicteric HENT: no thrush Respiratory: Normal respiratory effort; CTA B Cardiovascular: RRR MS: foot wrapped  Review of Systems: Constitutional: negative for fevers, chills and anorexia Gastrointestinal: negative for nausea and diarrhea Integument/breast: negative for rash Musculoskeletal: positive for back pain  Lab Results  Component Value Date   WBC 12.0 (H) 10/16/2018   HGB 9.3 (L) 10/16/2018   HCT 31.7 (L) 10/16/2018   MCV 87.3 10/16/2018   PLT 394 10/16/2018    Lab Results  Component Value Date   CREATININE 0.66 10/16/2018   BUN 13 10/16/2018   NA 138 10/16/2018   K 3.5 10/16/2018   CL 104 10/16/2018   CO2 27 10/16/2018    Lab Results  Component Value Date   ALT 50 (H) 07/13/2018   AST 31 07/13/2018   ALKPHOS 91 07/13/2018     Microbiology: Recent Results (from the past 240 hour(s))  Culture, blood (Routine X 2) w Reflex to ID Panel     Status: None   Collection Time: 10/10/18  7:55 PM   Specimen: BLOOD  Result Value Ref Range Status   Specimen Description BLOOD LEFT ARM  Final   Special Requests   Final    BOTTLES DRAWN AEROBIC ONLY Blood Culture adequate volume   Culture   Final    NO GROWTH 5 DAYS Performed at [facility], [address], [place], [place] [address]    Report Status 10/15/2018 FINAL  Final  Culture, blood (Routine X 2) w Reflex to ID Panel     Status: None   Collection Time: 10/10/18  8:00 PM   Specimen: BLOOD  Result Value Ref Range Status   Specimen Description BLOOD LEFT  ARM  Final   Special Requests   Final    BOTTLES DRAWN AEROBIC ONLY Blood Culture adequate volume   Culture   Final    NO GROWTH 5 DAYS Performed at [facility], [address], [place], [place] [address]    Report Status 10/15/2018 FINAL  Final  Surgical pcr screen     Status: None   Collection Time: 10/11/18 12:21 AM   Specimen: Nasal Mucosa; Nasal Swab  Result Value Ref Range Status   MRSA, PCR NEGATIVE NEGATIVE Final   Staphylococcus aureus NEGATIVE NEGATIVE Final    Comment: (NOTE) The Xpert SA Assay (FDA approved for NASAL specimens in patients 20 years of age and older), is one component of a comprehensive surveillance program. It is not intended to diagnose infection nor to guide or monitor treatment. Performed at [facility], [address] [place], [place] [address]   SARS Coronavirus 2     Status: None   Collection Time: 10/11/18 12:23 AM  Result Value Ref Range Status   SARS Coronavirus 2 NOT DETECTED NOT DETECTED Final    Comment: (NOTE) SARS-CoV-2 target nucleic acids are NOT DETECTED. The SARS-CoV-2 RNA is generally detectable in upper and lower respiratory specimens during the acute phase  of infection.  Negative  results do not preclude SARS-CoV-2 infection, do not rule out co-infections with other pathogens, and should not be used as the sole basis for treatment or other patient management decisions.  Negative results must be combined with clinical observations, patient history, and epidemiological information. The expected result is Not Detected. Fact Sheet for Patients: http://www.biofiredefense.com/wp-content/uploads/2020/03/BIOFIRE-COVID -19-patients.pdf Fact Sheet for Healthcare Providers: http://www.biofiredefense.com/wp-content/uploads/2020/03/BIOFIRE-COVID -19-hcp.pdf This test is not yet approved or cleared by the [place] FDA and  has been authorized for detection and/or diagnosis of SARS-CoV-2 by FDA under an Emergency Use  Authorization (EUA).  This EUA will remain in effec t (meaning this test can be used) for the duration of  the COVID-19 declaration under Section 564(b)(1) of the Act, 21 U.S.C. section 360bbb-3(b)(1), unless the authorization is terminated or revoked sooner. Performed at [facility], [address] [place], [place] [address]   Body fluid culture     Status: None   Collection Time: 10/12/18 11:15 AM   Specimen: Fluid; Wound  Result Value Ref Range Status   Specimen Description FLUID  Final   Special Requests NONE  Final   Gram Stain   Final    RARE WBC PRESENT, PREDOMINANTLY PMN NO ORGANISMS SEEN    Culture   Final    NO GROWTH Performed at [facility], [address] [place], [place] [address]    Report Status 10/15/2018 FINAL  Final  Acid Fast Smear (AFB)     Status: None   Collection Time: 10/12/18 11:15 AM   Specimen: Fluid; Wound  Result Value Ref Range Status   AFB Specimen Processing Concentration  Final   Acid Fast Smear Negative  Final    Comment: (NOTE) Performed At: [facility] [address] [place], [place] [id number] Jolene SchimkeNagendra Sanjai MD WG:[phone]    Source (AFB) FLUID  Final    Comment: Performed at [facility], [address] [place], [place] [address]  Culture, fungus without smear     Status: None (Preliminary result)   Collection Time: 10/12/18 11:15 AM   Specimen: Fluid  Result Value Ref Range Status   Specimen Description FLUID  Final   Special Requests NONE  Final   Culture   Final    NO FUNGUS ISOLATED AFTER 3 DAYS Performed at [facility], [address] [place], [place] [address]    Report Status PENDING  Incomplete  Surgical PCR screen     Status: None   Collection Time: 10/16/18  8:33 AM   Specimen: Nasal Mucosa; Nasal Swab  Result Value Ref Range Status   MRSA, PCR NEGATIVE NEGATIVE Final   Staphylococcus aureus NEGATIVE NEGATIVE Final    Comment: (NOTE) The Xpert SA Assay (FDA approved for  NASAL specimens in patients 65 years of age and older), is one component of a comprehensive surveillance program. It is not intended to diagnose infection nor to guide or monitor treatment. Performed at [facility], [address] [place], [place] [address]     Impression/Plan:  1. MRSA bacteremia - on vancomycin and will need a prolonged course.  Creat remains wnl.  2.  PPM vegetation - for removal today by Dr. Ladona Ridgelaylor.  Placement to be done at a later time by his primary EP cardiologist since he is not PM dependent.  He will get a prolonged course of IV antibiotics due to #3.   3.  Thoracolumbar vertebral discitis/osteomyelitis - no new growth on  cultures.  Will get a prolonged course of antibotics for this, 6-8 weeks.  Dr. Saintclair Halsted to see patient.    4.  Access - I prefer to wait until PM is removed prior to placing picc line, tomorrow should be fine.    Dr. Baxter Flattery on tomorrow.

## 2018-10-16 NOTE — Anesthesia Procedure Notes (Signed)
Arterial Line Insertion Start/End6/15/2020 2:39 PM, 10/16/2018 2:40 PM Performed by: CRNA  Preanesthetic checklist: patient identified, IV checked, site marked, risks and benefits discussed, surgical consent, monitors and equipment checked, pre-op evaluation, timeout performed and anesthesia consent Lidocaine 1% used for infiltration Left, radial was placed Catheter size: 20 G Hand hygiene performed  and maximum sterile barriers used  Allen's test indicative of satisfactory collateral circulation Attempts: 1 Procedure performed without using ultrasound guided technique. Ultrasound Notes:anatomy identified Following insertion, dressing applied and Biopatch. Post procedure assessment: normal  Patient tolerated the procedure well with no immediate complications.

## 2018-10-16 NOTE — Progress Notes (Signed)
  Echocardiogram Echocardiogram Transesophageal has been performed.  Henry Mcpherson 10/16/2018, 6:40 PM

## 2018-10-16 NOTE — Anesthesia Preprocedure Evaluation (Addendum)
Anesthesia Evaluation  Patient identified by MRN, date of birth, ID band Patient awake    Reviewed: Allergy & Precautions, NPO status , Patient's Chart, lab work & pertinent test results  Airway Mallampati: II  TM Distance: >3 FB     Dental   Pulmonary Current Smoker,    breath sounds clear to auscultation       Cardiovascular hypertension, + CAD and +CHF  + dysrhythmias  Rhythm:Regular Rate:Normal     Neuro/Psych    GI/Hepatic Neg liver ROS, GERD  ,  Endo/Other  diabetes  Renal/GU negative Renal ROS     Musculoskeletal   Abdominal   Peds  Hematology   Anesthesia Other Findings   Reproductive/Obstetrics                             Anesthesia Physical Anesthesia Plan  ASA: IV  Anesthesia Plan: General   Post-op Pain Management:    Induction: Intravenous  PONV Risk Score and Plan: Ondansetron, Dexamethasone and Midazolam  Airway Management Planned: Oral ETT  Additional Equipment:   Intra-op Plan:   Post-operative Plan: Possible Post-op intubation/ventilation  Informed Consent: I have reviewed the patients History and Physical, chart, labs and discussed the procedure including the risks, benefits and alternatives for the proposed anesthesia with the patient or authorized representative who has indicated his/her understanding and acceptance.     Dental advisory given  Plan Discussed with: CRNA, Anesthesiologist and Surgeon  Anesthesia Plan Comments:        Anesthesia Quick Evaluation

## 2018-10-17 ENCOUNTER — Encounter (HOSPITAL_COMMUNITY): Payer: Self-pay | Admitting: Internal Medicine

## 2018-10-17 ENCOUNTER — Inpatient Hospital Stay (HOSPITAL_COMMUNITY): Payer: Medicare Other

## 2018-10-17 LAB — BASIC METABOLIC PANEL
Anion gap: 8 (ref 5–15)
BUN: 13 mg/dL (ref 8–23)
CO2: 25 mmol/L (ref 22–32)
Calcium: 8.8 mg/dL — ABNORMAL LOW (ref 8.9–10.3)
Chloride: 104 mmol/L (ref 98–111)
Creatinine, Ser: 0.75 mg/dL (ref 0.61–1.24)
GFR calc Af Amer: >60 mL/min (ref >60)
GFR calc non Af Amer: >60 mL/min (ref >60)
Glucose, Bld: 177 mg/dL — ABNORMAL HIGH (ref 70–99)
Potassium: 3.7 mmol/L (ref 3.5–5.1)
Sodium: 137 mmol/L (ref 135–145)

## 2018-10-17 LAB — GLUCOSE, CAPILLARY
Glucose-Capillary: 177 mg/dL — ABNORMAL HIGH (ref 70–99)
Glucose-Capillary: 214 mg/dL — ABNORMAL HIGH (ref 70–99)
Glucose-Capillary: 178 mg/dL — ABNORMAL HIGH (ref 70–99)
Glucose-Capillary: 166 mg/dL — ABNORMAL HIGH (ref 70–99)

## 2018-10-17 LAB — BRAIN NATRIURETIC PEPTIDE: B Natriuretic Peptide: 231.2 pg/mL — ABNORMAL HIGH (ref 0.0–100.0)

## 2018-10-17 LAB — MAGNESIUM: Magnesium: 1.9 mg/dL (ref 1.7–2.4)

## 2018-10-17 MED ORDER — PHENYLEPHRINE HCL-NACL 10-0.9 MG/250ML-% IV SOLN
INTRAVENOUS | Status: AC
  Filled 2018-10-17: qty 500

## 2018-10-17 MED ORDER — GENERIC EXTERNAL MEDICATION
Status: DC
Start: ? — End: 2018-10-17

## 2018-10-17 MED ORDER — GADOBUTROL 1 MMOL/ML IV SOLN
9.0000 mL | Freq: Once | INTRAVENOUS | Status: AC | PRN
  Administered 2018-10-17: 9 mL via INTRAVENOUS

## 2018-10-17 MED ORDER — FUROSEMIDE 40 MG PO TABS
40.0000 mg | ORAL_TABLET | Freq: Two times a day (BID) | ORAL | Status: DC
  Administered 2018-10-17 – 2018-10-25 (×17): 40 mg via ORAL
  Filled 2018-10-17 (×17): qty 1

## 2018-10-17 MED ORDER — RIFAMPIN 300 MG PO CAPS
300.0000 mg | ORAL_CAPSULE | Freq: Two times a day (BID) | ORAL | Status: DC
  Administered 2018-10-17 – 2018-10-25 (×17): 300 mg via ORAL
  Filled 2018-10-17 (×18): qty 1

## 2018-10-17 MED ORDER — POTASSIUM CHLORIDE CRYS ER 20 MEQ PO TBCR
40.0000 meq | EXTENDED_RELEASE_TABLET | Freq: Once | ORAL | Status: AC
  Administered 2018-10-17: 40 meq via ORAL
  Filled 2018-10-17: qty 2

## 2018-10-17 NOTE — Progress Notes (Signed)
OT Cancellation Note  Patient Details Name: Kymir Coles MRN: [id number] DOB: 1955-09-06   Cancelled Treatment:    Reason Eval/Treat Not Completed: Patient at procedure or test/ unavailable. Attempted to see patient for OT evaluation. Patient currently out of room at MRI. Will re-attempt this afternoon.    Ailene Ravel, OTR/L,CBIS  [phone]  10/17/2018, 12:26 PM

## 2018-10-17 NOTE — Evaluation (Signed)
Occupational Therapy Evaluation Patient Details Name: Henry Mcpherson MRN: [id number] DOB: 06-Nov-1955 Today's Date: 10/17/2018    History of Present Illness 63 y.o. male with medical history significant of sCHF with EF 45%, A. fib, tachybradycardia syndrome s/p PM, COPD, DM type II, PUD, lumbar stenosis, and cauda equina syndrome, s/p lumbar decompression and fusion by Dr. 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    Clinical Impression   Pt in bed upon therapy arrival and agreeable to participate in OT evaluation. Patient reports that around March 2020 when he underwent his surgery, he began to require increased physical assistance to complete ADL tasks in addition to having increased difficulty with walking. Patient reports that he had OT and PT home health services during that time. He receives assistance from a hired male aide 1-2 hours a day (AM and PM) to complete bathing and dressing tasks. His wife is able to assist him as needed also. Although, patient does show potential for OT services with his current status and new onset of BLE weakness which requires additional physical assistance with basic ADL tasks; which he was previously independent with; he does not wish to pursue any at this time. Patient wishes to focus on PT services to work on his mobility and BLE weakness. He states that he plans to return home and continue working with Home health PT services. With that being said, no further OT needs are indicated at this time. Pt may request OT services in the future if he wishes. Patient currently has all the available assistance he needs at home and no further DME needs are seen. Will sign off. Thank you for the referral.     Follow Up Recommendations  No OT follow up(Per patient's request)    Equipment Recommendations  None recommended by OT        Precautions / Restrictions Precautions Precautions: Fall Precaution Comments: Due to difficulty walking and BLE weakness Restrictions Weight Bearing Restrictions: No      Mobility Bed Mobility Overal bed mobility: (Not tested)    Transfers Overall transfer level: (Not tested)          ADL either performed or assessed with clinical judgement   ADL       General ADL Comments: Functional transfer not completed. Patient declined due to back pain. Reports that he will complete with PT evaluation tomorrow. Pt reports that he requires increased assistance with bathing, dressing, functional transfers, and toileting, Requires Min-Max assist and is at baseline per patient report.     Vision Baseline Vision/History: Wears glasses Wears Glasses: Reading only Patient Visual Report: No change from baseline              Pertinent Vitals/Pain Pain Assessment: 0-10 Pain Score: 3  Pain Location: pacemake location. 7/10 back pain Pain Descriptors / Indicators: Aching Pain Intervention(s): Limited activity within patient's tolerance;Premedicated before session;Monitored during session     Hand Dominance Right   Extremity/Trunk Assessment Upper Extremity Assessment Upper Extremity Assessment: RUE deficits/detail;LUE deficits/detail RUE Deficits / Details: Severe Right shoulder arthritis limiting Active and passive ROM. patient able to achieve just under 90 degrees shoulder flexion, approximately 60-50 degrees abduction. MMT: 3-/5 shoulder flexion, abduction, IR/er. Functional gross grasp. LUE Deficits / Details: Generalized weakness. Patient reports that he is able to complete all daily tasks that he needs to with his present LUE strength. Full  A/ROM shoulder, elbow, and wrist ranges. MMT: 4/5 shoulder flexion, 4-/5 shoulder abduction and IR/er. Functional gross grasp.   Lower Extremity Assessment Lower Extremity Assessment: Defer to PT evaluation       Communication  Communication Communication: No difficulties   Cognition Arousal/Alertness: Awake/alert Behavior During Therapy: WFL for tasks assessed/performed Overall Cognitive Status: Within Functional Limits for tasks assessed                  Home Living Family/patient expects to be discharged to:: Private residence Living Arrangements: Spouse/significant other Available Help at Discharge: Available 24 hours/day;Family Type of Home: House Home Access: Stairs to enter [employer] of Steps: 5 Entrance Stairs-Rails: Can reach both;Right;Left Home Layout: One level     Bathroom Shower/Tub: Producer, television/film/video: Handicapped height     Home Equipment: Grab bars - tub/shower;Grab bars - toilet;Walker - 2 wheels;Shower seat - built in;Wheelchair - manual;Hospital bed          Prior Functioning/Environment          Comments: Patient has a male aide that comes in 1-2hrs twice a day (AM and PM) to assist with bathing and dressing. Required assistance for functional transfers.He has received home health PT and OT services in the past. He reports he has all the bands and handweights from his OT therapy.        OT Problem List: Pain;Impaired balance (sitting and/or standing);Impaired UE functional use;Decreased activity tolerance;Decreased strength      OT Treatment/Interventions:      OT Goals(Current goals can be found in the care plan section) Acute Rehab OT Goals Patient Stated Goal: To work with PT  OT Frequency:      AM-PAC OT "6 Clicks" Daily Activity     Outcome Measure Help from another person eating meals?: None Help from another person taking care of personal grooming?: A Little Help from another person toileting, which includes using toliet, bedpan, or urinal?: A Lot Help from another person bathing (including washing, rinsing, drying)?: A Lot Help from another person to put on and taking off regular upper body clothing?: A Lot Help from another  person to put on and taking off regular lower body clothing?: A Lot 6 Click Score: 15   End of Session    Activity Tolerance: Patient tolerated treatment well Patient left: in bed;with call bell/phone within reach;with bed alarm set  OT Visit Diagnosis: Muscle weakness (generalized) (M62.81)                Time: 7262-0355 OT Time Calculation (min): 18 min Charges:  OT General Charges $OT Visit: 1 Visit OT Evaluation $OT Eval Low Complexity: 1 Low  Limmie Patricia, OTR/L,CBIS  [phone]   Grenda Lora, Charisse March 10/17/2018, 3:05 PM

## 2018-10-17 NOTE — Progress Notes (Signed)
PROGRESS NOTE  Henry Mcpherson RSW:[id number] DOB: 05/11/1955 DOA: 10/10/2018 PCP: System, Pcp Not In   LOS: 7 days   Patient is from: Home  Brief Narrative / Interim history: 63 y.o. male with medical history significant ofsCHF with EF 45%, A. 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  Patient had surgical resection of left foot by podiatry at OSH.  On day 11 of vancomycin on day of transfer.  Repeat blood culture reportedly negative.  He states that he continues to have back pain, bilateral leg weakness, difficulty walking.  He states that he has decreased sensation in both legs which he attributes to diabetic neuropathy.  Denies new bowel or bladder issues other than usual poor bowel control.  Reportedly had lumbar X-ray 3 weeks ago, and finding was concerning for possible infection as per Dr. Windy Carina review.  On arrival here, hemodynamically stable.  Hemoglobin 7.8 (about baseline).  Electrolytes not impressive.  COVID-19 negative.  Per admitting provider, Dr. Ellene Route from neurosurgery consulted on arrival.  CT lumbar spine showed superior migration of left L4 screw, loosening of left L3 screw, discitis at T12-L1 with regional osteomyelitis and possible discitis at L3-4 and L5-S1.  Patient had IR fluoroscopy guided biopsy of the thoracolumbar discitis on 10/12/2018.  Cultures were sent.  He also had TTE which did not show vegetation.  Underwent TEE on 10/13/2018  Subjective: No major events overnight of this morning.  Had PPM extraction without complication yesterday.  In A. fib but heart rate has been within normal range overnight.  Denies chest pain, dyspnea, palpitation, GI or GU symptoms.  Denies new  neuro symptoms.  Assessment & Plan: MRSA bacteremia: Likely from left foot infection. -Infectious disease following. -ESR and CRP 50/1.5 respectively. -Repeat blood culture at OSH reportedly negative  -Repeat blood culture on 10/11/2018 here negative so far. -Continue IV vancomycin-started at OSH 11 days prior to transfer -Rifampin added per ID on 6/10 -Plan for 6 to 8 weeks of IV antibiotics given discitis and osteomyelitis. -Trend leukocytosis -PICC line placement either today or tomorrow per ID.  Possible pacemaker lead vegetation versus thrombus -TEE showed 2.1 x 0.4 cm lesion in right atrium attached to the pacemaker lead -Pacemaker extracted on 6/15 -Replacement of PPM per his primary EP cardiologist after treatment of infection if needed.  Possible thoracolumbar discitis and regional osteomyelitis -Status post IR fluoroscopy guided biopsy of the discitis on 10/12/2018 -Tissue culture including fungal cultures negative but this is about 12 days after he started antibiotic. -Follow AFB culture.  AFB smear negative. -Plan for 6 to 8 weeks of IV antibiotic  History of cauda equina syndrome and spinal stenosis of lumbar region:  -s/p of lumbar decompression and fusion by Dr. Saintclair Halsted on 3/4.   -CT lumbar spine concerning for loosening of the screws, discitis and osteomyelitis as above -IR fluoroscopy guided biopsy of the discitis on 10/12/2018-follow cultures. -NS on board-okay to start PT/OT per Dr. Saintclair Halsted -Antibiotic as above -PRN Percocet and Flexeril abdominal for pain.  Left foot infection-great toe: s/p of amputation by podiatry at outside hospital.  -Antibiotic as above -Appreciate wound care input  Chronic obstructive lung disease ([place]): stable. -prn albuterol nebs  Controlled IDDM-2 with complications, diabetic foot infection: on  NovoLog and Levemir  20 units at home.  A1c 6.2.  CBG within appropriate range. -SSI -Levemir on hold  Essential hypertension: Normotensive  -IV Hydralazine prn -Metoprolol on hold by cardiology  Chronic diastolic CHF:  TTE on 1/63 with EF of 55 to 60% (improved), moderate LVH, indeterminate DD, moderately reduced RVSP no vegetation.  On 07/05/2018 showed EF 45-50%.  No cardiopulmonary symptoms.  Slightly worsening edema although weight is stable.  I wonder if this is third spacing. -Check albumin level -We will increase home Lasix to 40 mg twice daily. -Daily weight, intake output and renal function -Monitor electrolytes and replenish  Permanent atrial Fibrillation:  CHA2DS2-VASc Score is 5 but not on AC.  Now he has possible GIB with stool guaiac positive.  -Metoprolol and digoxin on hold by cardiology -Pacemaker extracted on 6/15 -Closely monitor electrolytes and replenish aggressively.  CAD (coronary artery disease): s/p of stent and CABG. No Cp. -Continue lipitor -Metoprolol on hold by cardiology.  Chronic blood loss anemia and anemia of chronic disease:  FOBT positive.  Hb 8.2 (about baseline).  -protonix 40 bid -Monitor H&H  Stage II pressure injuries over his left buttock: POA -Appreciate wound care input.  Scheduled Meds: . acidophilus  1 capsule Oral Daily  . atorvastatin  40 mg Oral Daily  . enoxaparin (LOVENOX) injection  40 mg Subcutaneous Q24H  . fentaNYL      . fentaNYL      . furosemide  40 mg Oral Daily  . insulin aspart  0-9 Units Subcutaneous TID WC  . nicotine  14 mg Transdermal Daily  . pantoprazole  40 mg Oral BID  . potassium chloride  40 mEq Oral Once  . rifampin  300 mg Oral Q8H  . sodium chloride flush  3 mL Intravenous Q12H   Continuous Infusions: . sodium chloride Stopped (10/17/18 0617)  .  ceFAZolin (ANCEF) IV Stopped (10/17/18 0618)  . lactated ringers 10 mL/hr at 10/16/18 1404  . magnesium sulfate bolus IVPB    . vancomycin Stopped (10/17/18 0003)   PRN Meds:.sodium chloride, acetaminophen, albuterol, cyclobenzaprine, hydrALAZINE, ondansetron (ZOFRAN) IV,  oxyCODONE-acetaminophen   DVT prophylaxis: SCD given chronic GI bleed/FOBT positive Code Status: Full code Family Communication: Updated patient's wife over the phone on 6/12 Disposition Plan: Remains inpatient for MRSA bacteremia with pacemaker vegetation, discitis, osteomyelitis and diabetic left foot wound.  Needs PICC line and prolonged course of antibiotic per ID.  Consultants:   Neurosurgery  Infectious disease  EP/cardiology  IR  Procedures:   IR fluoroscopy guided biopsy of thoracolumbar discitis on 6/11  TEE on 6/12  Pacemaker extraction on 6/15.  Microbiology: . Initial blood culture at outside hospital reportedly MRSA . Repeat blood cultures outside hospital reportedly negative . Blood culture on 10/10/2018-negative so far . MRSA PCR negative . COVID-19 negative . Tissue cultures negative so far . Tissue fungal cultures negative so far . Tissue AFB culture pending  Antimicrobials: Anti-infectives (From admission, onward)   Start     Dose/Rate Route Frequency Ordered Stop   10/17/18 0000  ceFAZolin (ANCEF) IVPB 1 g/50 mL premix     1 g 100 mL/hr over 30 Minutes Intravenous Every 6 hours 10/16/18 2108 10/17/18 1759   10/16/18 1315  gentamicin (GARAMYCIN) 80 mg in sodium chloride 0.9 % 500 mL irrigation  Status:  Discontinued     80 mg Irrigation To ShortStay Surgical 10/16/18 0832 10/16/18 2059   10/16/18 1315  ceFAZolin (ANCEF) IVPB 2g/100 mL premix     2 g 200 mL/hr  over 30 Minutes Intravenous To ShortStay Surgical 10/16/18 0832 10/16/18 1840   10/11/18 1400  rifampin (RIFADIN) capsule 300 mg     300 mg Oral Every 8 hours 10/11/18 1100     10/10/18 2130  vancomycin (VANCOCIN) IVPB 1000 mg/200 mL premix     1,000 mg 200 mL/hr over 60 Minutes Intravenous Every 24 hours 10/10/18 2126        Objective: Vitals:   10/16/18 2206 10/16/18 2331 10/17/18 0104 10/17/18 0506  BP: (!) 143/93 132/84 136/85 128/65  Pulse: 85 76 (!) 102 96  Resp: 17 18 17 19    Temp: (!) 97.4 F (36.3 C) (!) 97.5 F (36.4 C) 97.7 F (36.5 C) 97.9 F (36.6 C)  TempSrc: Oral Oral Oral Oral  SpO2: 93% 98% 100% 99%  Weight:      Height:        Intake/Output Summary (Last 24 hours) at 10/17/2018 0650 Last data filed at 10/17/2018 0618 Gross per 24 hour  Intake 1380.07 ml  Output 700 ml  Net 680.07 ml   Filed Weights   10/13/18 2029 10/15/18 0600 10/16/18 0712  Weight: 86 kg 88 kg 86.4 kg    Examination:.  GENERAL: No acute distress.  Appears well.  HEENT: MMM.  Vision and hearing grossly intact.  NECK: Supple.  No JVD.  LUNGS:  No IWOB. Good air movement bilaterally. HEART: Irregular rhythm.  Normal rate. Heart sounds normal.  ABD: Bowel sounds present. Soft. Non tender.  MSK/EXT: Bilateral lower extremity weakness.  2+ pitting edema in upper and lower extremities. SKIN: Stage II pressure skin injuries over right buttock as below. NEURO: Awake, alert and oriented appropriately.  No gross deficit except for bilateral lower extremity weakness and symmetric diminished light sensation.  Patellar reflexes symmetric. PSYCH: Calm. Normal affect.  Picture on 10/12/2018    Data Reviewed: I have independently reviewed following labs and imaging studies  CBC: Recent Labs  Lab 10/11/18 0611 10/12/18 0552 10/14/18 0716 10/16/18 0931 10/16/18 2020  WBC 11.2* 13.2* 11.6* 12.0* 13.3*  HGB 8.4* 8.2* 8.0* 9.3* 9.0*  HCT 27.8* 28.1* 26.3* 31.7* 30.5*  MCV 86.9 87.5 85.9 87.3 88.4  PLT 342 402* 350 394 161   Basic Metabolic Panel: Recent Labs  Lab 10/11/18 0611 10/13/18 2113 10/14/18 0716 10/15/18 0735 10/16/18 0931 10/16/18 2020  NA 140 138 137 139 138  --   K 3.2* 3.3* 3.1* 3.4* 3.5  --   CL 105 104 104 105 104  --   CO2 27 24 25 26 27   --   GLUCOSE 128* 174* 146* 105* 179*  --   BUN 15 19 18 16 13   --   CREATININE 0.63 0.92 0.69 0.71 0.66 0.66  CALCIUM 8.5* 8.0* 8.1* 8.3* 8.9  --   MG  --   --   --  1.2* 1.7  --    GFR: Estimated  Creatinine Clearance: 107 mL/min (by C-G formula based on SCr of 0.66 mg/dL). Liver Function Tests: Recent Labs  Lab 10/16/18 2020  ALBUMIN 2.1*   No results for input(s): LIPASE, AMYLASE in the last 168 hours. No results for input(s): AMMONIA in the last 168 hours. Coagulation Profile: Recent Labs  Lab 10/11/18 0611  INR 1.2   Cardiac Enzymes: No results for input(s): CKTOTAL, CKMB, CKMBINDEX, TROPONINI in the last 168 hours. BNP (last 3 results) No results for input(s): PROBNP in the last 8760 hours. HbA1C: No results for input(s): HGBA1C in the last 72 hours. CBG:  Recent Labs  Lab 10/16/18 0713 10/16/18 1119 10/16/18 1359 10/16/18 2001 10/16/18 2116  GLUCAP 124* 132* 120* 133* 147*   Lipid Profile: No results for input(s): CHOL, HDL, LDLCALC, TRIG, CHOLHDL, LDLDIRECT in the last 72 hours. Thyroid Function Tests: No results for input(s): TSH, T4TOTAL, FREET4, T3FREE, THYROIDAB in the last 72 hours. Anemia Panel: No results for input(s): VITAMINB12, FOLATE, FERRITIN, TIBC, IRON, RETICCTPCT in the last 72 hours. Urine analysis:    Component Value Date/Time   COLORURINE YELLOW 07/08/2018 0224   APPEARANCEUR CLEAR 07/08/2018 0224   LABSPEC 1.011 07/08/2018 0224   PHURINE 6.0 07/08/2018 0224   GLUCOSEU NEGATIVE 07/08/2018 0224   HGBUR SMALL (A) 07/08/2018 0224   BILIRUBINUR NEGATIVE 07/08/2018 0224   KETONESUR NEGATIVE 07/08/2018 0224   PROTEINUR NEGATIVE 07/08/2018 0224   NITRITE NEGATIVE 07/08/2018 0224   LEUKOCYTESUR SMALL (A) 07/08/2018 0224   Sepsis Labs: Invalid input(s): PROCALCITONIN, LACTICIDVEN  Recent Results (from the past 240 hour(s))  Culture, blood (Routine X 2) w Reflex to ID Panel     Status: None   Collection Time: 10/10/18  7:55 PM   Specimen: BLOOD  Result Value Ref Range Status   Specimen Description BLOOD LEFT ARM  Final   Special Requests   Final    BOTTLES DRAWN AEROBIC ONLY Blood Culture adequate volume   Culture   Final    NO  GROWTH 5 DAYS Performed at [facility], [address] [place], [place] [address]    Report Status 10/15/2018 FINAL  Final  Culture, blood (Routine X 2) w Reflex to ID Panel     Status: None   Collection Time: 10/10/18  8:00 PM   Specimen: BLOOD  Result Value Ref Range Status   Specimen Description BLOOD LEFT ARM  Final   Special Requests   Final    BOTTLES DRAWN AEROBIC ONLY Blood Culture adequate volume   Culture   Final    NO GROWTH 5 DAYS Performed at [facility], [place] [address], [place], [place] [address]    Report Status 10/15/2018 FINAL  Final  Surgical pcr screen     Status: None   Collection Time: 10/11/18 12:21 AM   Specimen: Nasal Mucosa; Nasal Swab  Result Value Ref Range Status   MRSA, PCR NEGATIVE NEGATIVE Final   Staphylococcus aureus NEGATIVE NEGATIVE Final    Comment: (NOTE) The Xpert SA Assay (FDA approved for NASAL specimens in patients 75 years of age and older), is one component of a comprehensive surveillance program. It is not intended to diagnose infection nor to guide or monitor treatment. Performed at [facility], [place] [address] [place], [place] [address]   SARS Coronavirus 2     Status: None   Collection Time: 10/11/18 12:23 AM  Result Value Ref Range Status   SARS Coronavirus 2 NOT DETECTED NOT DETECTED Final    Comment: (NOTE) SARS-CoV-2 target nucleic acids are NOT DETECTED. The SARS-CoV-2 RNA is generally detectable in upper and lower respiratory specimens during the acute phase of infection.  Negative  results do not preclude SARS-CoV-2 infection, do not rule out co-infections with other pathogens, and should not be used as the sole basis for treatment or other patient management decisions.  Negative results must be combined with clinical observations, patient history, and epidemiological information. The expected result is Not Detected. Fact Sheet for Patients:  http://www.biofiredefense.com/wp-content/uploads/2020/03/BIOFIRE-COVID -19-patients.pdf Fact Sheet for Healthcare Providers: http://www.biofiredefense.com/wp-content/uploads/2020/03/BIOFIRE-COVID -19-hcp.pdf This test is not yet approved or cleared  by the [place] and  has been authorized for detection and/or diagnosis of SARS-CoV-2 by FDA under an Emergency Use Authorization (EUA).  This EUA will remain in effec t (meaning this test can be used) for the duration of  the COVID-19 declaration under Section 564(b)(1) of the Act, 21 U.S.C. section 360bbb-3(b)(1), unless the authorization is terminated or revoked sooner. Performed at [facility], [place] [address] [place], [place] [address]   Body fluid culture     Status: None   Collection Time: 10/12/18 11:15 AM   Specimen: Fluid; Wound  Result Value Ref Range Status   Specimen Description FLUID  Final   Special Requests NONE  Final   Gram Stain   Final    RARE WBC PRESENT, PREDOMINANTLY PMN NO ORGANISMS SEEN    Culture   Final    NO GROWTH Performed at [facility], [place] [address] [place], [place] [address]    Report Status 10/15/2018 FINAL  Final  Acid Fast Smear (AFB)     Status: None   Collection Time: 10/12/18 11:15 AM   Specimen: Fluid; Wound  Result Value Ref Range Status   AFB Specimen Processing Concentration  Final   Acid Fast Smear Negative  Final    Comment: (NOTE) Performed At: [facility] [place], [place] [id number] Rush Farmer MD [id number]    Source (AFB) FLUID  Final    Comment: Performed at [facility], [place] [address] [place], [place] [address]  Culture, fungus without smear     Status: None (Preliminary result)   Collection Time: 10/12/18 11:15 AM   Specimen: Fluid  Result Value Ref Range Status   Specimen Description FLUID  Final   Special Requests NONE  Final   Culture   Final    NO FUNGUS ISOLATED AFTER 3 DAYS Performed at [place] [address], [place], [place] [address]    Report Status PENDING  Incomplete  Surgical PCR screen     Status: None   Collection Time: 10/16/18  8:33 AM   Specimen: Nasal Mucosa; Nasal Swab  Result Value Ref Range Status   MRSA, PCR NEGATIVE NEGATIVE Final   Staphylococcus aureus NEGATIVE NEGATIVE Final    Comment: (NOTE) The Xpert SA Assay (FDA approved for NASAL specimens in patients 82 years of age and older), is one component of a comprehensive surveillance program. It is not intended to diagnose infection nor to guide or monitor treatment. Performed at [facility], [place] [address], [place], [place] [address]       Radiology Studies: No results found.  Javiel Canepa [facility] Pager [phone]  If 7PM-7AM, please contact night-coverage [email or web address] Password [email or web address] 10/17/2018, 6:50 AM

## 2018-10-17 NOTE — Discharge Instructions (Signed)
Wound care instructions (pacemanker removal site) Keep incision clean and dry, no showers until cleared to at your wound care check. You can remove outer dressing tomorrow. Leave steri-strips (little pieces of tape) on until seen in the office for wound check appointment. Call the office [phone]) for redness, drainage, swelling, or fever.

## 2018-10-17 NOTE — Progress Notes (Addendum)
[place] for Infectious Disease   Admission Date: 10/10/2018   Antibiotic Days: 19  Vancomycin day 19  Rifampin day 7  Reason for visit:  Follow up on MRSA bacteremia  Interval History:  PPM has been removed and evidently not as dependent on his pacer per EP notes. No plans to re-implant   Review of Systems: Constitutional: negative for fevers, chills and anorexia Gastrointestinal: negative for nausea and diarrhea Integument/breast: negative for rash Musculoskeletal: positive for back pain Physical Exam: Vitals:   10/17/18 0506 10/17/18 0919  BP: 128/65 (!) 144/81  Pulse: 96 (!) 57  Resp: 19 18  Temp: 97.9 F (36.6 C) 98.4 F (36.9 C)  SpO2: 99% 100%  Constitutional:  patient appears in NAD Eyes: anicteric HENT: no thrush Skin: multiple bruises noted along arms.  Respiratory: Normal respiratory effort; CTA B Cardiovascular: RRR MS: R foot wrapped in clean and dry dressing   Lab Results  Component Value Date   WBC 13.3 (H) 10/16/2018   HGB 9.0 (L) 10/16/2018   HCT 30.5 (L) 10/16/2018   MCV 88.4 10/16/2018   PLT 369 10/16/2018    Lab Results  Component Value Date   CREATININE 0.75 10/17/2018   BUN 13 10/17/2018   NA 137 10/17/2018   K 3.7 10/17/2018   CL 104 10/17/2018   CO2 25 10/17/2018    Lab Results  Component Value Date   ALT 50 (H) 07/13/2018   AST 31 07/13/2018   ALKPHOS 91 07/13/2018     Microbiology: Recent Results (from the past 240 hour(s))  Culture, blood (Routine X 2) w Reflex to ID Panel     Status: None   Collection Time: 10/10/18  7:55 PM   Specimen: BLOOD  Result Value Ref Range Status   Specimen Description BLOOD LEFT ARM  Final   Special Requests   Final    BOTTLES DRAWN AEROBIC ONLY Blood Culture adequate volume   Culture   Final    NO GROWTH 5 DAYS Performed at [facility], [address] [place], [place] [address]    Report Status 10/15/2018 FINAL  Final  Culture, blood (Routine X 2) w Reflex to ID Panel      Status: None   Collection Time: 10/10/18  8:00 PM   Specimen: BLOOD  Result Value Ref Range Status   Specimen Description BLOOD LEFT ARM  Final   Special Requests   Final    BOTTLES DRAWN AEROBIC ONLY Blood Culture adequate volume   Culture   Final    NO GROWTH 5 DAYS Performed at [facility], [place] [address] [place], [place] [address]    Report Status 10/15/2018 FINAL  Final  Surgical pcr screen     Status: None   Collection Time: 10/11/18 12:21 AM   Specimen: Nasal Mucosa; Nasal Swab  Result Value Ref Range Status   MRSA, PCR NEGATIVE NEGATIVE Final   Staphylococcus aureus NEGATIVE NEGATIVE Final    Comment: (NOTE) The Xpert SA Assay (FDA approved for NASAL specimens in patients 54 years of age and older), is one component of a comprehensive surveillance program. It is not intended to diagnose infection nor to guide or monitor treatment. Performed at [facility], [place] [address], [place], [place] [address]   SARS Coronavirus 2     Status: None   Collection Time: 10/11/18 12:23 AM  Result Value Ref Range Status   SARS Coronavirus 2 NOT DETECTED NOT DETECTED Final  Comment: (NOTE) SARS-CoV-2 target nucleic acids are NOT DETECTED. The SARS-CoV-2 RNA is generally detectable in upper and lower respiratory specimens during the acute phase of infection.  Negative  results do not preclude SARS-CoV-2 infection, do not rule out co-infections with other pathogens, and should not be used as the sole basis for treatment or other patient management decisions.  Negative results must be combined with clinical observations, patient history, and epidemiological information. The expected result is Not Detected. Fact Sheet for Patients: http://www.biofiredefense.com/wp-content/uploads/2020/03/BIOFIRE-COVID -19-patients.pdf Fact Sheet for Healthcare Providers: http://www.biofiredefense.com/wp-content/uploads/2020/03/BIOFIRE-COVID -19-hcp.pdf This test is not yet approved  or cleared by the [place] and  has been authorized for detection and/or diagnosis of SARS-CoV-2 by FDA under an Emergency Use Authorization (EUA).  This EUA will remain in effec t (meaning this test can be used) for the duration of  the COVID-19 declaration under Section 564(b)(1) of the Act, 21 U.S.C. section 360bbb-3(b)(1), unless the authorization is terminated or revoked sooner. Performed at [facility], [address], [place], [place] [address]   Body fluid culture     Status: None   Collection Time: 10/12/18 11:15 AM   Specimen: Fluid; Wound  Result Value Ref Range Status   Specimen Description FLUID  Final   Special Requests NONE  Final   Gram Stain   Final    RARE WBC PRESENT, PREDOMINANTLY PMN NO ORGANISMS SEEN    Culture   Final    NO GROWTH Performed at [facility], [address], [place], [place] [address]    Report Status 10/15/2018 FINAL  Final  Acid Fast Smear (AFB)     Status: None   Collection Time: 10/12/18 11:15 AM   Specimen: Fluid; Wound  Result Value Ref Range Status   AFB Specimen Processing Concentration  Final   Acid Fast Smear Negative  Final    Comment: (NOTE) Performed At: [facility] [address] [place], [place] [id number] Jolene Schimke MD EL:[id number]    Source (AFB) FLUID  Final    Comment: Performed at [facility], [address] [place], [place] [address]  Culture, fungus without smear     Status: None (Preliminary result)   Collection Time: 10/12/18 11:15 AM   Specimen: Fluid  Result Value Ref Range Status   Specimen Description FLUID  Final   Special Requests NONE  Final   Culture   Final    NO FUNGUS ISOLATED AFTER 3 DAYS Performed at [facility], [address] [place], [place] [address]    Report Status PENDING  Incomplete  Surgical PCR screen     Status: None   Collection Time: 10/16/18  8:33 AM   Specimen: Nasal Mucosa; Nasal Swab  Result Value Ref Range Status   MRSA,  PCR NEGATIVE NEGATIVE Final   Staphylococcus aureus NEGATIVE NEGATIVE Final    Comment: (NOTE) The Xpert SA Assay (FDA approved for NASAL specimens in patients 14 years of age and older), is one component of a comprehensive surveillance program. It is not intended to diagnose infection nor to guide or monitor treatment. Performed at [facility], [address], [place], [place] [address]     Impression/Plan:  1. MRSA bacteremia - on vancomycin treatment day 15. 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 Will need prolonged course of 6-8 weeks with reassessment.   2.  PPM vegetation - s/p system extraction by Dr. Ladona Ridgel. No plans to re-implant  at this time.    3.  Thoracolumbar vertebral discitis/osteomyelitis - no new growth on cultures although would suspect this to be MRSA. CT with loosening and migration of the hardware. MRI reports read today indicate chronic T12-L1 osteomyelitis/discitis. Prominent fluid within the L3-L4 and L5-S1 disc spaces with rim enhancement and end plate irregularity concerning for discitis. Will decrease Rifampin to BID for hardware in spine now that PPM is removed. Dr. Wynetta Emeryram is following and awaiting plan for surgery.   4.  Access - with no plans to place temporary pacer or re-implant at this time OK to place PICC line.   Rexene AlbertsStephanie Mija Effertz, MSN, NP-C [facility]  [email or web address] Pager: [phone] Office: [phone] [facility] Main Line: [phone]

## 2018-10-17 NOTE — Progress Notes (Addendum)
Progress Note  Patient Name: Henry Mcpherson Date of Encounter: 10/17/2018  Primary Cardiologist: Dr. Alden Server ([place], [place])  Subjective   No CP or SOB, feels "pretty good"  Inpatient Medications    Scheduled Meds: . acidophilus  1 capsule Oral Daily  . atorvastatin  40 mg Oral Daily  . enoxaparin (LOVENOX) injection  40 mg Subcutaneous Q24H  . furosemide  40 mg Oral BID  . insulin aspart  0-9 Units Subcutaneous TID WC  . nicotine  14 mg Transdermal Daily  . pantoprazole  40 mg Oral BID  . potassium chloride  40 mEq Oral Once  . potassium chloride  40 mEq Oral Once  . rifampin  300 mg Oral Q8H  . sodium chloride flush  3 mL Intravenous Q12H   Continuous Infusions: . sodium chloride Stopped (10/17/18 0617)  .  ceFAZolin (ANCEF) IV Stopped (10/17/18 0618)  . lactated ringers 10 mL/hr at 10/16/18 1404  . magnesium sulfate bolus IVPB    . vancomycin Stopped (10/17/18 0003)   PRN Meds: sodium chloride, acetaminophen, albuterol, cyclobenzaprine, hydrALAZINE, ondansetron (ZOFRAN) IV, oxyCODONE-acetaminophen   Vital Signs    Vitals:   10/16/18 2331 10/17/18 0104 10/17/18 0506 10/17/18 0919  BP: 132/84 136/85 128/65 (!) 144/81  Pulse: 76 (!) 102 96 (!) 57  Resp: 18 17 19 18   Temp: (!) 97.5 F (36.4 C) 97.7 F (36.5 C) 97.9 F (36.6 C) 98.4 F (36.9 C)  TempSrc: Oral Oral Oral Oral  SpO2: 98% 100% 99% 100%  Weight:      Height:        Intake/Output Summary (Last 24 hours) at 10/17/2018 1000 Last data filed at 10/17/2018 0618 Gross per 24 hour  Intake 1380.07 ml  Output 700 ml  Net 680.07 ml   Last 3 Weights 10/16/2018 10/15/2018 10/13/2018  Weight (lbs) 190 lb 7.6 oz 194 lb 0.1 oz 189 lb 9.5 oz  Weight (kg) 86.4 kg 88 kg 86 kg      Telemetry    Likely Paroxysmal AFib, rates 80's-90's, PVCs, infrequent NSVT - Personally Reviewed  ECG    SR, PAC, RBBB - Personally Reviewed  Physical Exam   GEN: No acute distress, appears chronically ill Neck: No  JVD Cardiac: RRR, no murmurs, rubs, or gallops.  Respiratory: Clear to auscultation bilaterally. GI: not examined  MS: 1+ edema and chronic looking skin changes b/l LE, L foot is dressed Neuro:  Nonfocal  Psych: Normal affect   L chest: surgical site is dry, sutures in place, no bleeding or hematoma  Labs    Chemistry Recent Labs  Lab 10/15/18 0735 10/16/18 0931 10/16/18 2020 10/17/18 0654  NA 139 138  --  137  K 3.4* 3.5  --  3.7  CL 105 104  --  104  CO2 26 27  --  25  GLUCOSE 105* 179*  --  177*  BUN 16 13  --  13  CREATININE 0.71 0.66 0.66 0.75  CALCIUM 8.3* 8.9  --  8.8*  ALBUMIN  --   --  2.1*  --   GFRNONAA >60 >60 >60 >60  GFRAA >60 >60 >60 >60  ANIONGAP 8 7  --  8     Hematology Recent Labs  Lab 10/14/18 0716 10/16/18 0931 10/16/18 2020  WBC 11.6* 12.0* 13.3*  RBC 3.06* 3.63* 3.45*  HGB 8.0* 9.3* 9.0*  HCT 26.3* 31.7* 30.5*  MCV 85.9 87.3 88.4  MCH 26.1 25.6* 26.1  MCHC 30.4 29.3* 29.5*  RDW 17.6* 18.1* 18.5*  PLT 350 394 369    Cardiac EnzymesNo results for input(s): TROPONINI in the last 168 hours. No results for input(s): TROPIPOC in the last 168 hours.   BNP Recent Labs  Lab 10/10/18 2004  BNP 500.2*     DDimer No results for input(s): DDIMER in the last 168 hours.   Radiology    Dg Chest 2 View Result Date: 10/17/2018 CLINICAL DATA:  Pacemaker removal. EXAM: CHEST - 2 VIEW COMPARISON:  Two-view chest x-ray 07/12/2018 FINDINGS: Patient is status post median sternotomy for CABG. Left-sided pacemaker and wires have been removed. Asymmetric right lower lobe airspace disease is present. Small right pleural effusion is suspected. Lung volumes are low. Degenerative changes are noted at the right shoulder. IMPRESSION: 1. Interval removal of pacemaker and leads. 2. Asymmetric right lower lobe airspace disease and effusion concerning for infection. Electronically Signed   By: Marin Roberts M.D.   On: 10/17/2018 08:54    Cardiac Studies    10/13/2018: TEE FINDINGS:  Vegetation vs thrombus on pacemaker lead. No valvular vegetations.  Moderate mitral regurgitation with systolic blunting of pulmonary vein Doppler.  Mild tricuspid regurgitation.   Aortic atherosclerosis No PFO. Valsalva not performed due to RA vegatation/thrombus on lead.    Patient Profile     63 y.o. male with PMHx of chronic CHF (systolic), AFib (described as permanent), tachy-brady w/PPM, COPD, DM, PUD, GIB (not on a/c), CAD w/prior CABG, lumbar stenosis w/cauda equina syndrome, s/p lumbar decompression March this year, L great toe infection s/p amputation recently  H&P: Pt was hospitalized to [facility] hospital in the past 11 days due to diabetic toe infection. Patient was seen by podiatry at the facility and status post resection. Blood cultures at the facility were positive for MRSA bacteremia. Patient was treated with 11 days of vancomycin and repeated cultures were noted to be negative. Currently pt does not have fever or chills.  He continues to have left foot pain, which is constant, sharp, 7 out of 10 in severity.Also reported to have been transfused 1 unit of packed red blood cells and stool guaiacs were noted to be positive. Labs note WBC 13.2 (trending down) and hemoglobin 7.8 which is reportedly near his baseline.  Currently patient denies nausea, vomiting, diarrhea or abdominal pain.  Patient states that he had loose stool bowel movement recently, which has resolved completely.  Denies symptoms of UTI.  No facial droop or slurred speech.  Pt states that he underwent lumbar decompression and fusion surgery by Dr. Wynetta [place] 3/4 due to cauda equina syndrome. He states that he continues to have back pain, bilateral leg weakness, difficulty walking.  He states that he has decreased sensation in both legs which he attributes to diabetic neuropathy.  Patient does not have incontinence of urine or bowel movement, but feels like he does not have good  control of bowel movement. He states that he had lumbar X-ray 3 weeks ago, and finding was concerning for possible infection as per Dr. Lonie Peak review (per pt's report).  10/11/18 ID brought on board Patient underwent TEE 10/13/2018 with findings of vegetation vs thrombus on PPM lead Likely  with Hardware Loosening, biopsy negative though had already been on antibiotics, ID reports possibly false negative  Assessment & Plan    1. MRSA bacteremia     PPM lead vegetation vs thrombus by TEE     Likely Thoracolumbar Vertebral Discitis/Osteomyelitis     Recent toe amputation   C/w ID  and IM services 10/10/2018 BC (x2) are neg x 5days   2. Tachy-brady syndrome     SJM PPM generator implanted 11/29/15, V lead 05/01/2009 (presumably the A lead as well)     [place] His cardiologist is Dr. Blanch MediaBassil, he would like to be able to get back there for re-implantation and management with his attending cardiologist.  S/p PPM system extraction yesterday CXR is without ptx this AM Site looks great, sutures in place AFib on telemetry, rates look good, generally 80's-90's EP follow up is in place and discussed with the patient  Wound care is discussed with the patient   3. AFib, described as permanent, his EKG today looks SR     His home dig and toprol held to avoid need for temp-perm device     CHA2DS2Vasc is 4, not on a/c 2/2 h/o GIB by notes   4. Post-op CXR done this AM     Asymmetric right lower lobe airspace disease and effusion concerning for infection     Defer to IM and ID on the case    EP will follow from afar, while in-house, OK from discharge from our perspective when medically able STOP his home Toprol and digoxin on discharge    For questions or updates, please contact [facility] Please consult [email or web address] for contact info under   Signed, Sheilah PigeonRenee Lynn Ursuy, PA-C  10/17/2018, 10:00 AM    EP Attending  Patient seen and examined. Agree with  above. He is doing well after PPM lead extraction for PM lead SBE. He has not had any bradycardia. He will continue anti-biotics. We have no plans at this point to place a new PPM. He may require a very low dose of beta blocker going forward.  Mylia Pondexter,M.D.  Leonia ReevesGregg Mekaila Tarnow,M.D.

## 2018-10-17 NOTE — Plan of Care (Signed)
  Problem: Education: Goal: Knowledge of General Education information will improve Description Including pain rating scale, medication(s)/side effects and non-pharmacologic comfort measures Outcome: Progressing   

## 2018-10-17 NOTE — Progress Notes (Signed)
Subjective: Patient reports Patient overall doing about the same still with back pain denies any significant leg pain or any new numbness tingling or weakness.  Objective: Vital signs in last 24 hours: Temp:  [97.2 F (36.2 C)-97.9 F (36.6 C)] 97.9 F (36.6 C) (06/16 0506) Pulse Rate:  [72-102] 96 (06/16 0506) Resp:  [13-32] 19 (06/16 0506) BP: (92-143)/(41-94) 128/65 (06/16 0506) SpO2:  [93 %-100 %] 99 % (06/16 0506) Arterial Line BP: (137-149)/(56-61) 149/59 (06/15 2019)  Intake/Output from previous day: 06/15 0701 - 06/16 0700 In: 1380.1 [P.O.:180; I.V.:700; IV Piggyback:500.1] Out: 700 [Urine:650; Blood:50] Intake/Output this shift: No intake/output data recorded.  Lower extremity weakness stable bilateral foot drops 4- out of 5 quads and iliopsoas.  Lab Results: Recent Labs    10/16/18 0931 10/16/18 2020  WBC 12.0* 13.3*  HGB 9.3* 9.0*  HCT 31.7* 30.5*  PLT 394 369   BMET Recent Labs    10/15/18 0735 10/16/18 0931 10/16/18 2020  NA 139 138  --   K 3.4* 3.5  --   CL 105 104  --   CO2 26 27  --   GLUCOSE 105* 179*  --   BUN 16 13  --   CREATININE 0.71 0.66 0.66  CALCIUM 8.3* 8.9  --     Studies/Results: No results found.  Assessment/Plan: 63 year old gentleman with longstanding back pain status post decompression stabilization for cauda equina with residual lower extremity weakness progressive deterioration consistent with discitis T12-L1 well-known to me we are in the process of working this up and scheduling disc aspiration when he was admitted to an outside institution for infected great toe and subsequent amputation.  Patient been transferred here for ongoing evaluation.  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  This will now allow [place] to get an MRI scan without contrast of thoracic and lumbar spine to assess degree of infection is back.  Patient will need stabilization across the T12-L1 to space when infection under control and  cleared from cardiology.  We will defer to cardiology for timing of clearance.  We certainly can operate during the time of active infection if we had to.  Certainly as much time on antibiotics would be beneficial.  I will order the MRIs and we will proceed forward accordingly  LOS: 7 days     Yanice Maqueda P 10/17/2018, 7:44 AM

## 2018-10-17 NOTE — Progress Notes (Signed)
PT Cancellation Note  Patient Details Name: Henry Mcpherson MRN: [id number] DOB: Jun 08, 1955   Cancelled Treatment:    Reason Eval/Treat Not Completed: Fatigue/lethargy limiting ability to participate.  Asked PT to return after lunch as he was tired.  Try again as pt and time allow.   Ramond Dial 10/17/2018, 8:46 AM  Mee Hives, PT MS Acute Rehab Dept. Number: [place] [facility] [place]

## 2018-10-18 ENCOUNTER — Inpatient Hospital Stay: Payer: Self-pay

## 2018-10-18 DIAGNOSIS — I4819 Other persistent atrial fibrillation: Secondary | ICD-10-CM

## 2018-10-18 DIAGNOSIS — M4807 Spinal stenosis, lumbosacral region: Secondary | ICD-10-CM

## 2018-10-18 LAB — BASIC METABOLIC PANEL
Anion gap: 9 (ref 5–15)
BUN: 14 mg/dL (ref 8–23)
CO2: 26 mmol/L (ref 22–32)
Calcium: 8.9 mg/dL (ref 8.9–10.3)
Chloride: 102 mmol/L (ref 98–111)
Creatinine, Ser: 0.83 mg/dL (ref 0.61–1.24)
GFR calc Af Amer: >60 mL/min (ref >60)
GFR calc non Af Amer: >60 mL/min (ref >60)
Glucose, Bld: 185 mg/dL — ABNORMAL HIGH (ref 70–99)
Potassium: 3.6 mmol/L (ref 3.5–5.1)
Sodium: 137 mmol/L (ref 135–145)

## 2018-10-18 LAB — CBC
HCT: 29.1 % — ABNORMAL LOW (ref 39.0–52.0)
Hemoglobin: 8.6 g/dL — ABNORMAL LOW (ref 13.0–17.0)
MCH: 25.8 pg — ABNORMAL LOW (ref 26.0–34.0)
MCHC: 29.6 g/dL — ABNORMAL LOW (ref 30.0–36.0)
MCV: 87.4 fL (ref 80.0–100.0)
Platelets: 340 10*3/uL (ref 150–400)
RBC: 3.33 MIL/uL — ABNORMAL LOW (ref 4.22–5.81)
RDW: 18.6 % — ABNORMAL HIGH (ref 11.5–15.5)
WBC: 9.6 10*3/uL (ref 4.0–10.5)
nRBC: 0 % (ref 0.0–0.2)

## 2018-10-18 LAB — MAGNESIUM: Magnesium: 1.6 mg/dL — ABNORMAL LOW (ref 1.7–2.4)

## 2018-10-18 LAB — GLUCOSE, CAPILLARY
Glucose-Capillary: 162 mg/dL — ABNORMAL HIGH (ref 70–99)
Glucose-Capillary: 159 mg/dL — ABNORMAL HIGH (ref 70–99)
Glucose-Capillary: 159 mg/dL — ABNORMAL HIGH (ref 70–99)
Glucose-Capillary: 153 mg/dL — ABNORMAL HIGH (ref 70–99)

## 2018-10-18 MED ORDER — GENERIC EXTERNAL MEDICATION
Status: DC
Start: ? — End: 2018-10-18

## 2018-10-18 MED ORDER — POTASSIUM CHLORIDE CRYS ER 20 MEQ PO TBCR
40.0000 meq | EXTENDED_RELEASE_TABLET | Freq: Once | ORAL | Status: AC
  Administered 2018-10-18: 40 meq via ORAL
  Filled 2018-10-18: qty 2

## 2018-10-18 MED ORDER — MAGNESIUM SULFATE 2 GM/50ML IV SOLN
2.0000 g | Freq: Once | INTRAVENOUS | Status: AC
  Administered 2018-10-18: 2 g via INTRAVENOUS
  Filled 2018-10-18: qty 50

## 2018-10-18 MED ORDER — HYDROMORPHONE HCL 1 MG/ML IJ SOLN
0.5000 mg | INTRAMUSCULAR | Status: DC | PRN
  Administered 2018-10-18 – 2018-10-22 (×19): 0.5 mg via INTRAVENOUS
  Filled 2018-10-18 (×19): qty 1

## 2018-10-18 NOTE — Progress Notes (Signed)
PROGRESS NOTE  Henry Mcpherson GUY:[id number] DOB: 09-Oct-1955 DOA: 10/10/2018 PCP: System, Pcp Not In   LOS: 8 days   Patient is from: Home  Brief Narrative / Interim history: 63 y.o. male with medical history significant ofsCHF with EF 45%, A. 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  Patient had surgical resection of left foot by podiatry at OSH.  On day 11 of vancomycin on day of transfer.  Repeat blood culture reportedly negative.  He states that he continues to have back pain, bilateral leg weakness, difficulty walking.  He states that he has decreased sensation in both legs which he attributes to diabetic neuropathy.  Denies new bowel or bladder issues other than usual poor bowel control.  Reportedly had lumbar X-ray 3 weeks ago, and finding was concerning for possible infection as per Dr. Windy Carina review.  On arrival here, hemodynamically stable.  Hemoglobin 7.8 (about baseline).  Electrolytes not impressive.  COVID-19 negative.  Per admitting provider, Dr. Ellene Route from neurosurgery consulted on arrival.  CT lumbar spine showed superior migration of left L4 screw, loosening of left L3 screw, discitis at T12-L1 with regional osteomyelitis and possible discitis at L3-4 and L5-S1.  Patient had IR fluoroscopy guided biopsy of the thoracolumbar discitis on 10/12/2018.  Cultures were sent.  He also had TTE which was concerning for pacemaker lead vegetation versus thrombus.  Pacemaker extracted by cardiology on 6/15.   MRI of thoracic and lumbar spine on 6/16 -showed chronic T12-L1 osteomyelitis/discitis, concern for L3-L4 and L5-S1 discitis, severe spinal canal stenosis at T12-L1, residual moderate to severe  bilateral neuroforaminal stenosis at L4-L5 and L5-S1.  No abscess noted.   Subjective: No major events overnight of this morning.  Back pain worse last night and today after MRI of his back.  No new neuro symptoms.  Denies chest pain, dyspnea or palpitation.  Denies GI or GU symptoms.  Reports making good amount of urine.  Assessment & Plan: MRSA bacteremia: Likely from left foot infection. -Infectious disease following. -ESR and CRP 50/1.5 respectively. -Repeat blood culture at OSH reportedly negative  -Repeat blood culture on 10/11/2018 here negative so far. -Continue IV vancomycin-started at OSH 11 days prior to transfer -Rifampin added per ID on 6/10 -Plan for 6 to 8 weeks of IV antibiotics given discitis and osteomyelitis. -Ordered PICC line placement.  Possible pacemaker lead vegetation versus thrombus -TEE showed 2.1 x 0.4 cm lesion in right atrium attached to the pacemaker lead -Pacemaker extracted on 6/15 -Replacement of PPM per his primary EP cardiologist after treatment of infection if needed.  T12-L1, L3-L4 and L5-S1 discitis T12-L1 osteomyelitis History of cauda equina syndrome  T12-L1 severe spinal canal stenosis L4-L5 and L5-S1 moderate to severe bilateral neuroforaminal stenosis -s/p of lumbar decompression and fusion by Dr. Saintclair Halsted on 3/4.  -CT and MRI as above -Status post IR fluoroscopy guided biopsy of the discitis on 10/12/2018 -Tissue culture including fungal cultures negative but this is about 12 days after he started antibiotic. -Follow AFB culture.  AFB smear negative. -Plan for 6 to 8 weeks of IV antibiotic -NS on board- -Antibiotic as above -PRN Percocet and Flexeril abdominal for pain. -Added low-dose Dilaudid -Bowel regimen -PT/OT recommending CIR but patient refusing  Left foot infection-great toe: s/p of  amputation by podiatry at outside hospital.  -Antibiotic as above -Appreciate wound care input  Chronic obstructive lung disease ([place]): stable.  -prn albuterol nebs  Controlled IDDM-2 with complications, diabetic foot infection: on  NovoLog and Levemir 20 units at home.  A1c 6.2.  CBG within appropriate range. -SSI -Levemir on hold  Essential hypertension: Normotensive -IV Hydralazine prn -Metoprolol on hold by cardiology  Chronic diastolic CHF:  TTE on 0/93 with EF of 55 to 60% (improved), moderate LVH, indeterminate DD, moderately reduced RVSP no vegetation.  On 07/05/2018 showed EF 45-50%.  No cardiopulmonary symptoms.  Increased home Lasix to 40 mg twice daily due to worsening extremity edema.  Responding to Lasix.  Renal function stable -Continue Lasix to 40 mg twice daily. -Daily weight, intake output and renal function -Monitor electrolytes closely and replenish  Permanent atrial Fibrillation: Currently rate controlled.  CHA2DS2-VASc Score-5 but not on AC.  Now he has possible GIB with positive FOBT -Metoprolol and digoxin discontinued by cardiology -Pacemaker extracted on 6/15 due to agitation -Closely monitor electrolytes and replenish aggressively.  CAD (coronary artery disease): s/p of stent and CABG. No Cp. -Continue lipitor  Chronic blood loss anemia and anemia of chronic disease:  FOBT positive.  Hb 8.6 (about baseline).  -protonix 40 bid -Monitor H&H  Stage II pressure injuries over his left buttock: POA -Appreciate wound care input.  Scheduled Meds: . acidophilus  1 capsule Oral Daily  . atorvastatin  40 mg Oral Daily  . enoxaparin (LOVENOX) injection  40 mg Subcutaneous Q24H  . furosemide  40 mg Oral BID  . insulin aspart  0-9 Units Subcutaneous TID WC  . nicotine  14 mg Transdermal Daily  . pantoprazole  40 mg Oral BID  . rifampin  300 mg Oral Q12H  . sodium chloride flush  3 mL Intravenous Q12H   Continuous Infusions: . sodium chloride Stopped (10/17/18 0617)  . lactated ringers 10 mL/hr at 10/16/18 1404  . vancomycin 1,000 mg (10/17/18 2155)   PRN Meds:.sodium chloride, acetaminophen,  albuterol, cyclobenzaprine, hydrALAZINE, HYDROmorphone (DILAUDID) injection, ondansetron (ZOFRAN) IV, oxyCODONE-acetaminophen   DVT prophylaxis: SCD given chronic GI bleed/FOBT positive Code Status: Full code Family Communication: Patient updated family and let me know if any question Disposition Plan: Remains inpatient for MRSA bacteremia, discitis, osteomyelitis and severe spinal canal and foraminal stenosis  Consultants:   Neurosurgery  Infectious disease  EP/cardiology  IR  Procedures:   IR fluoroscopy guided biopsy of thoracolumbar discitis on 6/11  TEE on 6/12  Pacemaker extraction on 6/15.  Microbiology: . Initial blood culture at outside hospital reportedly MRSA . Repeat blood cultures outside hospital reportedly negative . Blood culture on 10/10/2018-negative so far . MRSA PCR negative . COVID-19 negative . Tissue cultures negative so far . Tissue fungal cultures negative so far . Tissue AFB culture pending  Antimicrobials: Anti-infectives (From admission, onward)   Start     Dose/Rate Route Frequency Ordered Stop   10/17/18 1100  rifampin (RIFADIN) capsule 300 mg     300 mg Oral Every 12 hours 10/17/18 1043     10/17/18 0000  ceFAZolin (ANCEF) IVPB 1 g/50 mL premix     1 g 100 mL/hr over 30 Minutes Intravenous Every 6 hours 10/16/18 2108 10/17/18 1324   10/16/18 1315  gentamicin (GARAMYCIN) 80 mg in sodium chloride 0.9 % 500 mL irrigation  Status:  Discontinued     80 mg Irrigation To ShortStay Surgical 10/16/18 0832 10/16/18 2059   10/16/18 1315  ceFAZolin (ANCEF)  IVPB 2g/100 mL premix     2 g 200 mL/hr over 30 Minutes Intravenous To ShortStay Surgical 10/16/18 0832 10/16/18 1840   10/11/18 1400  rifampin (RIFADIN) capsule 300 mg  Status:  Discontinued     300 mg Oral Every 8 hours 10/11/18 1100 10/17/18 1043   10/10/18 2130  vancomycin (VANCOCIN) IVPB 1000 mg/200 mL premix     1,000 mg 200 mL/hr over 60 Minutes Intravenous Every 24 hours 10/10/18 2126         Objective: Vitals:   10/17/18 1614 10/17/18 2129 10/18/18 0439 10/18/18 0946  BP: 111/65 (!) 115/59 131/76 137/60  Pulse: 62 85 (!) 102 (!) 46  Resp: _0 Temp: 97.8 F (36.6 C) 98.2 F (36.8 C) 98.2 F (36.8 C) 98.4 F (36.9 C)  TempSrc: Oral Oral Oral Oral  SpO2: 100% 99% 100% 100%  Weight:   89.3 kg   Height:        Intake/Output Summary (Last 24 hours) at 10/18/2018 1436 Last data filed at 10/18/2018 1300 Gross per 24 hour  Intake 1080.04 ml  Output 2900 ml  Net -1819.96 ml   Filed Weights   10/15/18 0600 10/16/18 0712 10/18/18 0439  Weight: 88 kg 86.4 kg 89.3 kg    Examination:.  GENERAL: No acute distress.  Appears well.  HEENT: MMM.  Vision and hearing grossly intact.  NECK: Supple.  No apparent JVD LUNGS:  No IWOB. Good air movement bilaterally. HEART:  RRR. Heart sounds normal.  ABD: Bowel sounds present. Soft. Non tender.  MSK/EXT: Bilateral lower extremity weakness.  2+ pitting edema in lower extremities.  Dependent edema in upper extremities. SKIN: Stage II pressure skin injuries over right buttock as below. NEURO: Awake, alert and oriented appropriately.  No gross deficit except for unchanged bilateral lower extremity weakness and symmetric diminished light sensation. Patellar reflex symmetric. PSYCH: Calm. Normal affect.  Picture on 10/12/2018    Data Reviewed: I have independently reviewed following labs and imaging studies  CBC: Recent Labs  Lab 10/12/18 0552 10/14/18 0716 10/16/18 0931 10/16/18 2020 10/18/18 0629  WBC 13.2* 11.6* 12.0* 13.3* 9.6  HGB 8.2* 8.0* 9.3* 9.0* 8.6*  HCT 28.1* 26.3* 31.7* 30.5* 29.1*  MCV 87.5 85.9 87.3 88.4 87.4  PLT 402* 350 394 369 779   Basic Metabolic Panel: Recent Labs  Lab 10/14/18 0716 10/15/18 0735 10/16/18 0931 10/16/18 2020 10/17/18 0654 10/18/18 0629  NA 137 139 138  --  137 137  K 3.1* 3.4* 3.5  --  3.7 3.6  CL 104 105 104  --  104 102  CO2 _1 --  25 26  GLUCOSE 146*  105* 179*  --  177* 185*  BUN _2 --  13 14  CREATININE 0.69 0.71 0.66 0.66 0.75 0.83  CALCIUM 8.1* 8.3* 8.9  --  8.8* 8.9  MG  --  1.2* 1.7  --  1.9 1.6*   GFR: Estimated Creatinine Clearance: 103.2 mL/min (by C-G formula based on SCr of 0.83 mg/dL). Liver Function Tests: Recent Labs  Lab 10/16/18 2020  ALBUMIN 2.1*   No results for input(s): LIPASE, AMYLASE in the last 168 hours. No results for input(s): AMMONIA in the last 168 hours. Coagulation Profile: No results for input(s): INR, PROTIME in the last 168 hours. Cardiac Enzymes: No results for input(s): CKTOTAL, CKMB, CKMBINDEX, TROPONINI in the last 168 hours. BNP (last 3 results) No results for input(s): PROBNP in the last 8760  hours. HbA1C: No results for input(s): HGBA1C in the last 72 hours. CBG: Recent Labs  Lab 10/17/18 1240 10/17/18 1610 10/17/18 2129 10/18/18 0654 10/18/18 1121  GLUCAP 214* 178* 166* 162* 159*   Lipid Profile: No results for input(s): CHOL, HDL, LDLCALC, TRIG, CHOLHDL, LDLDIRECT in the last 72 hours. Thyroid Function Tests: No results for input(s): TSH, T4TOTAL, FREET4, T3FREE, THYROIDAB in the last 72 hours. Anemia Panel: No results for input(s): VITAMINB12, FOLATE, FERRITIN, TIBC, IRON, RETICCTPCT in the last 72 hours. Urine analysis:    Component Value Date/Time   COLORURINE YELLOW 07/08/2018 0224   APPEARANCEUR CLEAR 07/08/2018 0224   LABSPEC 1.011 07/08/2018 0224   PHURINE 6.0 07/08/2018 0224   GLUCOSEU NEGATIVE 07/08/2018 0224   HGBUR SMALL (A) 07/08/2018 0224   BILIRUBINUR NEGATIVE 07/08/2018 0224   KETONESUR NEGATIVE 07/08/2018 0224   PROTEINUR NEGATIVE 07/08/2018 0224   NITRITE NEGATIVE 07/08/2018 0224   LEUKOCYTESUR SMALL (A) 07/08/2018 0224   Sepsis Labs: Invalid input(s): PROCALCITONIN, LACTICIDVEN  Recent Results (from the past 240 hour(s))  Culture, blood (Routine X 2) w Reflex to ID Panel     Status: None   Collection Time: 10/10/18  7:55 PM   Specimen:  BLOOD  Result Value Ref Range Status   Specimen Description BLOOD LEFT ARM  Final   Special Requests   Final    BOTTLES DRAWN AEROBIC ONLY Blood Culture adequate volume   Culture   Final    NO GROWTH 5 DAYS Performed at [facility], [address] [place], [place] [address]    Report Status 10/15/2018 FINAL  Final  Culture, blood (Routine X 2) w Reflex to ID Panel     Status: None   Collection Time: 10/10/18  8:00 PM   Specimen: BLOOD  Result Value Ref Range Status   Specimen Description BLOOD LEFT ARM  Final   Special Requests   Final    BOTTLES DRAWN AEROBIC ONLY Blood Culture adequate volume   Culture   Final    NO GROWTH 5 DAYS Performed at [facility], [place] [address], [place], [place] [address]    Report Status 10/15/2018 FINAL  Final  Surgical pcr screen     Status: None   Collection Time: 10/11/18 12:21 AM   Specimen: Nasal Mucosa; Nasal Swab  Result Value Ref Range Status   MRSA, PCR NEGATIVE NEGATIVE Final   Staphylococcus aureus NEGATIVE NEGATIVE Final    Comment: (NOTE) The Xpert SA Assay (FDA approved for NASAL specimens in patients 25 years of age and older), is one component of a comprehensive surveillance program. It is not intended to diagnose infection nor to guide or monitor treatment. Performed at [facility], [place] [address] [place], [place] [address]   SARS Coronavirus 2     Status: None   Collection Time: 10/11/18 12:23 AM  Result Value Ref Range Status   SARS Coronavirus 2 NOT DETECTED NOT DETECTED Final    Comment: (NOTE) SARS-CoV-2 target nucleic acids are NOT DETECTED. The SARS-CoV-2 RNA is generally detectable in upper and lower respiratory specimens during the acute phase of infection.  Negative  results do not preclude SARS-CoV-2 infection, do not rule out co-infections with other pathogens, and should not be used as the sole basis for treatment or other patient management decisions.  Negative results must be combined  with clinical observations, patient history, and epidemiological information. The expected result is Not Detected. Fact Sheet for Patients: http://www.biofiredefense.com/wp-content/uploads/2020/03/BIOFIRE-COVID -19-patients.pdf Fact Sheet  for Healthcare Providers: http://www.biofiredefense.com/wp-content/uploads/2020/03/BIOFIRE-COVID -19-hcp.pdf This test is not yet approved or cleared by the [place] and  has been authorized for detection and/or diagnosis of SARS-CoV-2 by FDA under an Emergency Use Authorization (EUA).  This EUA will remain in effec t (meaning this test can be used) for the duration of  the COVID-19 declaration under Section 564(b)(1) of the Act, 21 U.S.C. section 360bbb-3(b)(1), unless the authorization is terminated or revoked sooner. Performed at [facility], [place] [address], [place], [place] [address]   Body fluid culture     Status: None   Collection Time: 10/12/18 11:15 AM   Specimen: Fluid; Wound  Result Value Ref Range Status   Specimen Description FLUID  Final   Special Requests NONE  Final   Gram Stain   Final    RARE WBC PRESENT, PREDOMINANTLY PMN NO ORGANISMS SEEN    Culture   Final    NO GROWTH Performed at [facility], [place] [address] [place], [place] [address]    Report Status 10/15/2018 FINAL  Final  Acid Fast Smear (AFB)     Status: None   Collection Time: 10/12/18 11:15 AM   Specimen: Fluid; Wound  Result Value Ref Range Status   AFB Specimen Processing Concentration  Final   Acid Fast Smear Negative  Final    Comment: (NOTE) Performed At: [facility] [place], [place] [id number] Rush Farmer MD WG:[id number]    Source (AFB) FLUID  Final    Comment: Performed at [facility], [place] [address], [place], [place] [address]  Culture, fungus without smear     Status: None (Preliminary result)   Collection Time: 10/12/18 11:15 AM   Specimen: Fluid  Result Value Ref Range Status    Specimen Description FLUID  Final   Special Requests NONE  Final   Culture   Final    NO FUNGUS ISOLATED AFTER 3 DAYS Performed at [place] [address], [place], [place] [address]    Report Status PENDING  Incomplete  Surgical PCR screen     Status: None   Collection Time: 10/16/18  8:33 AM   Specimen: Nasal Mucosa; Nasal Swab  Result Value Ref Range Status   MRSA, PCR NEGATIVE NEGATIVE Final   Staphylococcus aureus NEGATIVE NEGATIVE Final    Comment: (NOTE) The Xpert SA Assay (FDA approved for NASAL specimens in patients 62 years of age and older), is one component of a comprehensive surveillance program. It is not intended to diagnose infection nor to guide or monitor treatment. Performed at [facility], [place] [address], [place], [place] [address]       Radiology Studies: [place] Ekg Site [place]  Result Date: 10/18/2018 If [facility] image not attached, placement could not be confirmed due to current cardiac rhythm.   [facility] Pager [phone]  If 7PM-7AM, please contact night-coverage [email or web address] Password [facility] 10/18/2018, 2:36 PM

## 2018-10-18 NOTE — Progress Notes (Signed)
Rehab Admissions Coordinator Note:  Patient was screened by Cleatrice Burke for appropriateness for an Inpatient Acute Rehab Consult per PT recs. Noted by OT eval that pt does not wish to pursue any inpt rehab venue. Requests d/c home with [facility] set up.   At this time, we are recommending [facility] unless pt would like to pursue both PT and OT services in a possible inpt rehab facility . Please clarify with pt.    Cleatrice Burke RN MSN 10/18/2018, 10:13 AM  I can be reached at [phone].

## 2018-10-18 NOTE — Progress Notes (Signed)
Patient ID: Henry Mcpherson, male   DOB: 05-24-1955, 63 y.o.   MRN: [id number] Reviewed MRI extensively does show probable residual discitis at T12-L1 with kyphosis and severe stenosis and some evidence of cord compression at that level.  Patient will need a decompression at that level and extension of his fusion up into his lower thoracic.  Certainly I would like the infection cleared as much as possible prior to this surgery however due to his paraparesis I also do not want to wait too long before we give that a chance to recover by doing a decompression at T12-L1. We will need to discuss with cardiology I would like a determination whether patient will need to have his pacemaker put back in before cardiology will clear him for surgery or whether he is okay to get surgery whenever myself and infectious disease feel like it safe to with regard to his infection.

## 2018-10-18 NOTE — Evaluation (Signed)
Physical Therapy Evaluation Patient Details Name: Henry Mcpherson MRN: [id number] DOB: 06-22-1955 Today's Date: 10/18/2018   History of Present Illness  63 y.o. male with medical history significant of sCHF with EF 45%, A. fib, tachybradycardia syndrome s/p PM, COPD, DM type II, PUD, lumbar stenosis, and cauda equina syndrome, s/p lumbar decompression and fusion by Dr. 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    Clinical Impression  Pt admitted with above diagnosis. Pt currently with functional limitations due to the deficits listed below (see PT Problem List). PTA, pt living with wife, caregiver daily, limited OOB mobility reports able to stand EOB for 10 seconds with therapy. Has had gradual decline dating back prior to surgery in March. Eager to return to ambulating again. Today, mod A to come to sitting, profound weakness in BLE, and also limitations with RUE. Reviewed patients BLE therex.  Pt will benefit from skilled PT to increase their independence and safety with mobility to allow discharge to the venue listed below.       Follow Up Recommendations CIR    Equipment Recommendations  (TBD)    Recommendations for Other Services Rehab consult     Precautions / Restrictions Precautions Precautions: Fall Precaution Comments: Due to difficulty walking and BLE weakness Restrictions Weight Bearing Restrictions: No      Mobility  Bed Mobility Overal bed mobility: Needs Assistance Bed Mobility: Rolling;Supine to Sit;Sit to Supine Rolling: Mod assist   Supine to sit: Mod assist Sit to supine: Mod assist   General bed mobility comments: Mod A to assist   Transfers                 General transfer comment: deferred due to safety   Ambulation/Gait                Stairs            Wheelchair Mobility     Modified Rankin (Stroke Patients Only)       Balance Overall balance assessment: Needs assistance   Sitting balance-Leahy Scale: Fair                                       Pertinent Vitals/Pain Pain Score: 6  Pain Location: 7/10 back pain Pain Descriptors / Indicators: Aching Pain Intervention(s): Limited activity within patient's tolerance    Home Living Family/patient expects to be discharged to:: Private residence Living Arrangements: Spouse/significant other Available Help at Discharge: Available 24 hours/day;Family Type of Home: House Home Access: Stairs to enter Entrance Stairs-Rails: Can reach both;Right;Left Entrance Stairs-Number of Steps: 5 Home Layout: One level Home Equipment: Grab bars - tub/shower;Grab bars - toilet;Walker - 2 wheels;Shower seat - built in;Wheelchair - manual;Hospital bed      Prior Function Level of Independence: Needs assistance   Gait / Transfers Assistance Needed: has not been amblulating lately, can stand for 10 seconds with HHPT  ADL's / Homemaking Assistance Needed: bed pan with wifes assistance  Comments: Patient has a male aide that comes in 1-2hrs twice a day (AM and PM) to assist with bathing and dressing. Required assistance for functional transfers.He has received home health PT and OT services in the past. He reports he has all the bands and handweights from his OT therapy.  Hand Dominance   Dominant Hand: Right    Extremity/Trunk Assessment   Upper Extremity Assessment Upper Extremity Assessment: Defer to OT evaluation    Lower Extremity Assessment Lower Extremity Assessment: Generalized weakness(2/5 BLE )       Communication   Communication: No difficulties  Cognition Arousal/Alertness: Awake/alert Behavior During Therapy: WFL for tasks assessed/performed Overall Cognitive Status: Within Functional Limits for tasks assessed                                        General  Comments      Exercises     Assessment/Plan    PT Assessment Patient needs continued PT services  PT Problem List Decreased strength       PT Treatment Interventions DME instruction;Gait training;Functional mobility training;Therapeutic activities;Therapeutic exercise;Balance training    PT Goals (Current goals can be found in the Care Plan section)  Acute Rehab PT Goals Patient Stated Goal: get stronger PT Goal Formulation: With patient Time For Goal Achievement: 11/01/18 Potential to Achieve Goals: Fair    Frequency Min 3X/week   Barriers to discharge        Co-evaluation               AM-PAC PT "6 Clicks" Mobility  Outcome Measure Help needed turning from your back to your side while in a flat bed without using bedrails?: A Lot Help needed moving from lying on your back to sitting on the side of a flat bed without using bedrails?: A Lot Help needed moving to and from a bed to a chair (including a wheelchair)?: Total Help needed standing up from a chair using your arms (e.g., wheelchair or bedside chair)?: Total Help needed to walk in hospital room?: Total Help needed climbing 3-5 steps with a railing? : Total 6 Click Score: 8    End of Session Equipment Utilized During Treatment: Gait belt Activity Tolerance: Patient tolerated treatment well Patient left: in bed Nurse Communication: Mobility status PT Visit Diagnosis: Unsteadiness on feet (R26.81)    Time: 2694-8546 PT Time Calculation (min) (ACUTE ONLY): 35 min   Charges:   PT Evaluation $PT Eval High Complexity: 1 High PT Treatments $Therapeutic Activity: 8-22 mins        Reinaldo Berber, PT, DPT Acute Rehabilitation Services Pager: [phone] Office: [place] 10/18/2018, 9:51 AM

## 2018-10-18 NOTE — TOC Initial Note (Addendum)
Transition of Care [facility]) - Initial/Assessment Note    Patient Details  Name: Henry Mcpherson MRN: [id number] Date of Birth: 1955/07/15  Transition of Care [facility]) CM/SW Contact:    Bartholomew Crews, RN Phone Number: [phone] 10/18/2018, 2:11 PM  Clinical Narrative:                 Spoke with patient at bedside to discuss discharge planning. Offered choice for home health agency - [facility]. Discussed transport home. Patient stated that he would need an ambulance - discussed over 50 miles and patient stated that he uses one service if less than 50 and another if over. Patient advised that he is home with his wife, and that she is the one to talk to with specific details.   Spoke to [employer] - spouse about discharge planning. Mrs. Rozo advised that she was told by the hospitalist - Dr. Alicia Amel at [facility] in [place], [place] - that patient is to be transferred back to [facility] where is discharge planning for rehabilitation will be determined. Barnetta Chapel stated that patient was transferred from [facility] to Regino Ramirez.   Mrs. Stephanie advised that patient is still pending f/u with Dr. Saintclair Halsted, and that he will need 6 weeks of antibiotics before his pacemaker can be replaced.   CM to follow for transition of care needs.   Expected Discharge Plan: [place] Barriers to Discharge: Continued Medical Work up   Patient Goals and CMS Choice   CMS Medicare.gov Compare Post Acute Care list provided to:: Patient Choice offered to / list presented to : Patient  Expected Discharge Plan and Services Expected Discharge Plan: [place] In-house Referral: NA Discharge Planning Services: CM Consult Post Acute Care Choice: [place] arrangements for the past 2 months: Single Family Home                 DME Arranged: N/A DME Agency: NA       HH Arranged: RN, PT [place] Agency: Other - See comment([facility])        Prior Living Arrangements/Services Living arrangements for the past 2 months: Single Family Home Lives with:: Self, Spouse Patient language and need for interpreter reviewed:: Yes Do you feel safe going back to the place where you live?: Yes      Need for Family Participation in Patient Care: Yes (Comment) Care giver support system in place?: Yes (comment) Current home services: DME Criminal Activity/Legal Involvement Pertinent to Current Situation/Hospitalization: No - Comment as needed  Activities of Daily Living Home Assistive Devices/Equipment: CBG Meter, Eyeglasses, Wheelchair ADL Screening (condition at time of admission) Patient's cognitive ability adequate to safely complete daily activities?: Yes Is the patient deaf or have difficulty hearing?: No Does the patient have difficulty seeing, even when wearing glasses/contacts?: No Does the patient have difficulty concentrating, remembering, or making decisions?: No Patient able to express need for assistance with ADLs?: Yes Does the patient have difficulty dressing or bathing?: Yes Independently performs ADLs?: No Communication: Independent Dressing (OT): Needs assistance Is this a change from baseline?: Pre-admission baseline Grooming: Independent Feeding: Independent Bathing: Needs assistance Is this a change from baseline?: Pre-admission baseline Toileting: Needs assistance Is this a change from baseline?: Pre-admission baseline In/Out Bed: Needs assistance Is this a change from baseline?: Pre-admission baseline Walks in Home: Needs assistance Is this a change from baseline?: Pre-admission baseline Does the patient have difficulty  walking or climbing stairs?: Yes Weakness of Legs: Both Weakness of Arms/Hands: Both  Permission Sought/Granted Permission sought to share information with : Family Supports Permission granted to share information with : Yes, Verbal Permission Granted  Share  Information with NAME: Clim Pawloski     Permission granted to share info w Relationship: spouse  Permission granted to share info w Contact Information: [phone]  Emotional Assessment Appearance:: Appears stated age Attitude/Demeanor/Rapport: Engaged Affect (typically observed): Accepting Orientation: : Oriented to Situation, Oriented to  Time, Oriented to Place, Oriented to Self Alcohol / Substance Use: Not Applicable Psych Involvement: No (comment)  Admission diagnosis:  Diabetes mellitus (HCC) [E11.9] Amputation of great toe [facility]) [I32.549I] Patient Active Problem List   Diagnosis Date Noted  . Left foot infection-great toe 10/11/2018  . Diabetes mellitus type 2 with complications (HCC) 10/11/2018  . Pressure injury of skin 10/11/2018  . Cardiac device in situ 10/11/2018  . 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 MRSA bacteremia 10/10/2018  . HTN (hypertension) 10/10/2018  . HLD (hyperlipidemia) 10/10/2018  . GERD (gastroesophageal reflux disease) 10/10/2018  . Chronic systolic CHF (congestive heart failure) (HCC) 10/10/2018  . Atrial fibrillation, chronic 10/10/2018  . CAD (coronary artery disease) 10/10/2018  . History of amputation of left great toe (HCC) 10/10/2018  . Anemia 10/10/2018  . Stool guaiac positive 10/10/2018  . Malnutrition of moderate degree 07/08/2018  . Spinal stenosis at L4-L5 level 07/05/2018  . Cauda equina syndrome (HCC) 07/03/2018  . Spinal stenosis of lumbar region 07/03/2018  . Community acquired pneumonia 06/29/2018  . Controlled type 2 diabetes mellitus without complication, without long-term current use of insulin (HCC) 06/26/2018  . Chronic obstructive lung disease (HCC) 06/24/2017  . Diabetic ulcer of right midfoot associated with type 2 diabetes mellitus, limited to breakdown of skin (HCC) 03/08/2016  . Foot drop, right 03/08/2016   PCP:  System, Pcp Not In Pharmacy:   [facility] [address], [place] - [address] [place] [address] Phone: [phone] Fax: [phone]     Social Determinants of Health (SDOH) Interventions    Readmission Risk Interventions No flowsheet data found.

## 2018-10-18 NOTE — Progress Notes (Addendum)
Electrophysiology Rounding Note  Patient Name: Henry ClarksJohn Lovelace Mcpherson Date of Encounter: 10/18/2018  [facility] Electrophysiologist: Sashia Campas   Subjective   The patient is doing ok today.  At this time, the patient denies chest pain, shortness of breath, or any new concerns.  Awaiting plan from back MRI done yesterday  Inpatient Medications    Scheduled Meds:  acidophilus  1 capsule Oral Daily   atorvastatin  40 mg Oral Daily   enoxaparin (LOVENOX) injection  40 mg Subcutaneous Q24H   furosemide  40 mg Oral BID   insulin aspart  0-9 Units Subcutaneous TID WC   nicotine  14 mg Transdermal Daily   pantoprazole  40 mg Oral BID   rifampin  300 mg Oral Q12H   sodium chloride flush  3 mL Intravenous Q12H   Continuous Infusions:  sodium chloride Stopped (10/17/18 0617)   lactated ringers 10 mL/hr at 10/16/18 1404   magnesium sulfate bolus IVPB 2 g (10/18/18 1003)   vancomycin 1,000 mg (10/17/18 2155)   PRN Meds: sodium chloride, acetaminophen, albuterol, cyclobenzaprine, hydrALAZINE, HYDROmorphone (DILAUDID) injection, ondansetron (ZOFRAN) IV, oxyCODONE-acetaminophen   Vital Signs    Vitals:   10/17/18 1614 10/17/18 2129 10/18/18 0439 10/18/18 0946  BP: 111/65 (!) 115/59 131/76 137/60  Pulse: 62 85 (!) 102 (!) 46  Resp: 18 20 16 18   Temp: 97.8 F (36.6 C) 98.2 F (36.8 C) 98.2 F (36.8 C) 98.4 F (36.9 C)  TempSrc: Oral Oral Oral Oral  SpO2: 100% 99% 100% 100%  Weight:   89.3 kg   Height:        Intake/Output Summary (Last 24 hours) at 10/18/2018 1006 Last data filed at 10/18/2018 0945 Gross per 24 hour  Intake 1320.04 ml  Output 2900 ml  Net -1579.96 ml   Filed Weights   10/15/18 0600 10/16/18 0712 10/18/18 0439  Weight: 88 kg 86.4 kg 89.3 kg    Physical Exam    GEN- The patient is elderly and chronically ill appearing, alert and oriented x 3 today. Appears much older than his stated age.f Head- normocephalic,  atraumatic Eyes-  Sclera clear, conjunctiva pink Ears- hearing intact Oropharynx- clear Neck- supple Lungs- Clear to ausculation bilaterally, normal work of breathing Heart- Irregular rate and rhythm  GI- soft, NT, ND, + BS Extremities- no clubbing, cyanosis, or edema Skin- no rash or lesion Psych- euthymic mood, full affect Neuro- strength and sensation are intact  Labs    CBC Recent Labs    10/16/18 2020 10/18/18 0629  WBC 13.3* 9.6  HGB 9.0* 8.6*  HCT 30.5* 29.1*  MCV 88.4 87.4  PLT 369 340   Basic Metabolic Panel Recent Labs    96/29/5206/16/20 0654 10/18/18 0629  NA 137 137  K 3.7 3.6  CL 104 102  CO2 25 26  GLUCOSE 177* 185*  BUN 13 14  CREATININE 0.75 0.83  CALCIUM 8.8* 8.9  MG 1.9 1.6*   Liver Function Tests Recent Labs    10/16/18 2020  ALBUMIN 2.1*     Telemetry    Sinus rhythm, frequent PAC's, AT (personally reviewed)  Radiology    Dg Chest 2 View  Result Date: 10/17/2018 CLINICAL DATA:  Pacemaker removal. EXAM: CHEST - 2 VIEW COMPARISON:  Two-view chest x-ray 07/12/2018 FINDINGS: Patient is status post median sternotomy for CABG. Left-sided pacemaker and wires have been removed. Asymmetric right lower lobe airspace disease is present. Small right pleural effusion is suspected. Lung volumes are low. Degenerative  changes are noted at the right shoulder. IMPRESSION: 1. Interval removal of pacemaker and leads. 2. Asymmetric right lower lobe airspace disease and effusion concerning for infection. Electronically Signed   By: San Morelle M.D.   On: 10/17/2018 08:54   Mr Thoracic Spine W Wo Contrast  Result Date: 10/17/2018 CLINICAL DATA:  Back pain.  T12-L1 osteomyelitis discitis. EXAM: MRI THORACIC AND LUMBAR SPINE WITHOUT AND WITH CONTRAST TECHNIQUE: Multiplanar and multiecho pulse sequences of the thoracic and lumbar spine were obtained without and with intravenous contrast. CONTRAST:  9 mm Gadavist intravenous contrast. COMPARISON:  Chest x-ray  from same day. CT lumbar spine dated October 11, 2018. FINDINGS: MRI THORACIC SPINE FINDINGS Alignment:  Physiologic. Vertebrae: No fracture, evidence of discitis, or bone lesion. T10 hemangioma. Cord:  Normal signal.  No intradural enhancement. Paraspinal and other soft tissues: Thick-walled 3.0 x 5.8 cm rim enhancing fluid collection in the right posterior pleural space, concerning for empyema. Adjacent consolidation in the right lower lobe. Prominent right greater than left paravertebral fat extending from T6-T11 with smooth, lobulated margins, potentially reflecting burnt out extramedullary hematopoiesis. Disc levels: T1-T2: Tiny left paracentral disc protrusion.  No stenosis. T2-T3: Negative. T3-T4: Small left paracentral disc osteophyte complex with slight mass effect on the left ventral cord. No stenosis. T4-T5: Negative. T5-T6: Tiny left paracentral disc protrusion.  No stenosis. T6-T7: Negative. T7-T8: Central disc extrusion with disc material migrating inferiorly posterior to the T8 vertebral body to T8-T9. Flattening of the ventral cord. No stenosis. T8-T9: Central disc extrusion with flattening of the ventral cord. Extruded disc material behind the T9 vertebral body. No stenosis. T9-T10: Negative. T10-T11: Small central disc protrusion.  No stenosis. T11-T12: Mild disc bulging eccentric to the left. Borderline mild spinal canal stenosis. Mild left neuroforaminal stenosis. MRI LUMBAR SPINE FINDINGS Segmentation:  Standard. Alignment: Unchanged dextroscoliosis. Unchanged trace anterolisthesis at L4-L5. Vertebrae: Fluid and rim enhancement within the T12-L1, L3-L4, and L5-S1 disc spaces. There is some associated endplate irregularity, most prominent at T12-L1. There is no significant vertebral body edema or enhancement. Fluid within the L1-L2 and L4-L5 disc spaces without definite endplate irregularity or enhancement. No fracture or focal bone lesion. Conus medullaris: Extends to the L1 level and appears  normal. No intradural enhancement. Paraspinal and other soft tissues: Left psoas muscle edema from L1-L3. No paravertebral or epidural abscess. Disc levels: T12-L1: Unchanged disc space widening with endplate irregularity and posterior endplate spurring resulting in severe spinal canal stenosis and moderate left greater than right neuroforaminal stenosis. L1-L2: Diffuse disc bulging with endplate spurring. Mild spinal canal and bilateral neuroforaminal stenosis. L2-L3: Chronic severe disc height loss with endplate spurring. Prior posterior decompression. Residual moderate left lateral recess stenosis. No spinal canal or neuroforaminal stenosis. L3-L4: Prior posterior decompression and fusion. Unchanged disc space widening with diffuse disc bulging and endplate spurring. Residual mild left lateral recess stenosis and moderate left neuroforaminal stenosis. L4-L5: Prior posterior decompression and fusion. Chronic circumferential disc osteophyte complex. Residual moderate to severe bilateral neuroforaminal stenosis. No spinal canal stenosis. L5-S1: Prior posterior decompression and fusion. Unchanged mild widening of the disc space and circumferential disc osteophyte complex. Residual severe right and moderate to severe left neuroforaminal stenosis. No spinal canal stenosis. IMPRESSION: Thoracic spine: 1. Thick walled 3.0 x 5.8 cm rim enhancing fluid collection in the right posterior pleural space, concerning for empyema. Adjacent right lower lobe consolidation concerning for pneumonia. Recommend dedicated chest CT with contrast for further evaluation. 2. Multilevel thoracic spondylosis as described above. Disc extrusions at  T7-T8 and T8-T9 with extruded disc material posterior to the T8 and T9 vertebral bodies. Mass effect on the ventral cord at T7-T8 and T8-T9. No high-grade stenosis. Lumbar spine: 1. Chronic T12-L1 osteomyelitis discitis. 2. Prominent fluid within the L3-L4 and L5-S1 disc spaces with rim enhancement  and mild endplate irregularity, concerning for indolent discitis. 3. No paravertebral or epidural abscess. 4. Multilevel lumbar spondylosis as described above. Severe spinal canal stenosis at T12-L1. 5. Prior L2-S1 posterior decompression and L3-S1 posterior fusion. Residual moderate to severe bilateral neuroforaminal stenosis at L4-L5 and L5-S1. Electronically Signed   By: Obie DredgeWilliam T Derry M.D.   On: 10/17/2018 15:12   Mr Lumbar Spine W Wo Contrast  Result Date: 10/17/2018 CLINICAL DATA:  Back pain.  T12-L1 osteomyelitis discitis. EXAM: MRI THORACIC AND LUMBAR SPINE WITHOUT AND WITH CONTRAST TECHNIQUE: Multiplanar and multiecho pulse sequences of the thoracic and lumbar spine were obtained without and with intravenous contrast. CONTRAST:  9 mm Gadavist intravenous contrast. COMPARISON:  Chest x-ray from same day. CT lumbar spine dated October 11, 2018. FINDINGS: MRI THORACIC SPINE FINDINGS Alignment:  Physiologic. Vertebrae: No fracture, evidence of discitis, or bone lesion. T10 hemangioma. Cord:  Normal signal.  No intradural enhancement. Paraspinal and other soft tissues: Thick-walled 3.0 x 5.8 cm rim enhancing fluid collection in the right posterior pleural space, concerning for empyema. Adjacent consolidation in the right lower lobe. Prominent right greater than left paravertebral fat extending from T6-T11 with smooth, lobulated margins, potentially reflecting burnt out extramedullary hematopoiesis. Disc levels: T1-T2: Tiny left paracentral disc protrusion.  No stenosis. T2-T3: Negative. T3-T4: Small left paracentral disc osteophyte complex with slight mass effect on the left ventral cord. No stenosis. T4-T5: Negative. T5-T6: Tiny left paracentral disc protrusion.  No stenosis. T6-T7: Negative. T7-T8: Central disc extrusion with disc material migrating inferiorly posterior to the T8 vertebral body to T8-T9. Flattening of the ventral cord. No stenosis. T8-T9: Central disc extrusion with flattening of the ventral  cord. Extruded disc material behind the T9 vertebral body. No stenosis. T9-T10: Negative. T10-T11: Small central disc protrusion.  No stenosis. T11-T12: Mild disc bulging eccentric to the left. Borderline mild spinal canal stenosis. Mild left neuroforaminal stenosis. MRI LUMBAR SPINE FINDINGS Segmentation:  Standard. Alignment: Unchanged dextroscoliosis. Unchanged trace anterolisthesis at L4-L5. Vertebrae: Fluid and rim enhancement within the T12-L1, L3-L4, and L5-S1 disc spaces. There is some associated endplate irregularity, most prominent at T12-L1. There is no significant vertebral body edema or enhancement. Fluid within the L1-L2 and L4-L5 disc spaces without definite endplate irregularity or enhancement. No fracture or focal bone lesion. Conus medullaris: Extends to the L1 level and appears normal. No intradural enhancement. Paraspinal and other soft tissues: Left psoas muscle edema from L1-L3. No paravertebral or epidural abscess. Disc levels: T12-L1: Unchanged disc space widening with endplate irregularity and posterior endplate spurring resulting in severe spinal canal stenosis and moderate left greater than right neuroforaminal stenosis. L1-L2: Diffuse disc bulging with endplate spurring. Mild spinal canal and bilateral neuroforaminal stenosis. L2-L3: Chronic severe disc height loss with endplate spurring. Prior posterior decompression. Residual moderate left lateral recess stenosis. No spinal canal or neuroforaminal stenosis. L3-L4: Prior posterior decompression and fusion. Unchanged disc space widening with diffuse disc bulging and endplate spurring. Residual mild left lateral recess stenosis and moderate left neuroforaminal stenosis. L4-L5: Prior posterior decompression and fusion. Chronic circumferential disc osteophyte complex. Residual moderate to severe bilateral neuroforaminal stenosis. No spinal canal stenosis. L5-S1: Prior posterior decompression and fusion. Unchanged mild widening of the disc  space  and circumferential disc osteophyte complex. Residual severe right and moderate to severe left neuroforaminal stenosis. No spinal canal stenosis. IMPRESSION: Thoracic spine: 1. Thick walled 3.0 x 5.8 cm rim enhancing fluid collection in the right posterior pleural space, concerning for empyema. Adjacent right lower lobe consolidation concerning for pneumonia. Recommend dedicated chest CT with contrast for further evaluation. 2. Multilevel thoracic spondylosis as described above. Disc extrusions at T7-T8 and T8-T9 with extruded disc material posterior to the T8 and T9 vertebral bodies. Mass effect on the ventral cord at T7-T8 and T8-T9. No high-grade stenosis. Lumbar spine: 1. Chronic T12-L1 osteomyelitis discitis. 2. Prominent fluid within the L3-L4 and L5-S1 disc spaces with rim enhancement and mild endplate irregularity, concerning for indolent discitis. 3. No paravertebral or epidural abscess. 4. Multilevel lumbar spondylosis as described above. Severe spinal canal stenosis at T12-L1. 5. Prior L2-S1 posterior decompression and L3-S1 posterior fusion. Residual moderate to severe bilateral neuroforaminal stenosis at L4-L5 and L5-S1. Electronically Signed   By: Obie Dredge M.D.   On: 10/17/2018 15:12     Patient Profile   63 y.o. male with PMHx of chronic CHF (systolic), AFib (described as permanent), tachy-brady w/PPM, COPD, DM, PUD, GIB (not on a/c), CAD w/prior CABG, lumbar stenosis w/cauda equina syndrome, s/p lumbar decompression March this year, L great toe infection s/p amputation recently now admitted with MRSA bacteremia.  S/p PPM extraction this admission by Dr Ladona Ridgel.  Assessment & Plan    1.  MRSA bacteremia S/p PPM system extraction Will need sutures removed around 6/24 (EP will do if still in house) Antibiotics per ID MRI reviewed briefly with patient by Dr Johney Frame today  2.  Tachy brady syndrome Tele ok Will avoid re-implant as long as possible Previously not felt to be a  candidate for [facility]   For questions or updates, please contact [facility] Please consult [email or web address] for contact info under Cardiology/STEMI.  Signed, Gypsy Balsam, NP  10/18/2018, 10:06 AM    I have seen, examined the patient, and reviewed the above assessment and plan.  Changes to above are made where necessary.  On exam, RRR.  On telemetry, sinus rhythm, frequent ectopy, no AV block/ pauses/ brady arrhythmias.  We will plan to avoid pacemaker indefinitely at this point.  OK to proceed with any additional procedures/ management without pacemaker implanted.  I worry about the empyema seen on MRI.   I am concerned that this is longstanding and may be his initial infectious source.  Dedicated CT has been advised, but not yet ordered.  I may review TCTS tomorrow.  ID is following also.  Their input may be beneficial.  Co Sign: Hillis Range, MD 10/18/2018 10:36 PM

## 2018-10-18 NOTE — Plan of Care (Signed)
  Problem: Activity: Goal: Risk for activity intolerance will decrease Outcome: Progressing   

## 2018-10-18 NOTE — Plan of Care (Signed)
  Problem: Education: Goal: Knowledge of General Education information will improve Description: Including pain rating scale, medication(s)/side effects and non-pharmacologic comfort measures Outcome: Completed/Met

## 2018-10-19 ENCOUNTER — Inpatient Hospital Stay (HOSPITAL_COMMUNITY): Payer: Medicare Other

## 2018-10-19 DIAGNOSIS — J869 Pyothorax without fistula: Secondary | ICD-10-CM

## 2018-10-19 LAB — BASIC METABOLIC PANEL
Anion gap: 11 (ref 5–15)
BUN: 12 mg/dL (ref 8–23)
CO2: 27 mmol/L (ref 22–32)
Calcium: 9 mg/dL (ref 8.9–10.3)
Chloride: 100 mmol/L (ref 98–111)
Creatinine, Ser: 0.85 mg/dL (ref 0.61–1.24)
GFR calc Af Amer: >60 mL/min (ref >60)
GFR calc non Af Amer: >60 mL/min (ref >60)
Glucose, Bld: 132 mg/dL — ABNORMAL HIGH (ref 70–99)
Potassium: 3.7 mmol/L (ref 3.5–5.1)
Sodium: 138 mmol/L (ref 135–145)

## 2018-10-19 LAB — GLUCOSE, CAPILLARY
Glucose-Capillary: 122 mg/dL — ABNORMAL HIGH (ref 70–99)
Glucose-Capillary: 190 mg/dL — ABNORMAL HIGH (ref 70–99)
Glucose-Capillary: 167 mg/dL — ABNORMAL HIGH (ref 70–99)
Glucose-Capillary: 162 mg/dL — ABNORMAL HIGH (ref 70–99)

## 2018-10-19 LAB — MAGNESIUM: Magnesium: 1.7 mg/dL (ref 1.7–2.4)

## 2018-10-19 LAB — HEPATIC FUNCTION PANEL
ALT: 7 U/L (ref 0–44)
AST: 15 U/L (ref 15–41)
Albumin: 2.2 g/dL — ABNORMAL LOW (ref 3.5–5.0)
Alkaline Phosphatase: 99 U/L (ref 38–126)
Bilirubin, Direct: 0.1 mg/dL (ref 0.0–0.2)
Indirect Bilirubin: 0.5 mg/dL (ref 0.3–0.9)
Total Bilirubin: 0.6 mg/dL (ref 0.3–1.2)
Total Protein: 5.7 g/dL — ABNORMAL LOW (ref 6.5–8.1)

## 2018-10-19 LAB — VANCOMYCIN, RANDOM
Vancomycin Rm: 28
Vancomycin Rm: 37

## 2018-10-19 MED ORDER — VANCOMYCIN HCL IN DEXTROSE 750-5 MG/150ML-% IV SOLN
750.0000 mg | INTRAVENOUS | Status: DC
  Administered 2018-10-19 – 2018-10-24 (×6): 750 mg via INTRAVENOUS
  Filled 2018-10-19 (×7): qty 150

## 2018-10-19 MED ORDER — GENERIC EXTERNAL MEDICATION
Status: DC
Start: ? — End: 2018-10-19

## 2018-10-19 MED ORDER — POTASSIUM CHLORIDE CRYS ER 20 MEQ PO TBCR
40.0000 meq | EXTENDED_RELEASE_TABLET | Freq: Once | ORAL | Status: AC
  Administered 2018-10-19: 40 meq via ORAL
  Filled 2018-10-19: qty 2

## 2018-10-19 MED ORDER — MAGNESIUM SULFATE 2 GM/50ML IV SOLN
2.0000 g | Freq: Once | INTRAVENOUS | Status: AC
  Administered 2018-10-19: 2 g via INTRAVENOUS
  Filled 2018-10-19: qty 50

## 2018-10-19 NOTE — Consult Note (Addendum)
[address]       [place] [address]             [phone]        Ivor Bloyd [facility] Medical Record #[id number] Date of Birth: June 25, 1955  Referring: No ref. provider found Primary Care: System, Pcp Not In Primary Cardiologist:No primary care provider on file.  Chief Complaint:   Malaise, fatigue, fever   History of Present Illness:      Henry Mcpherson is a 63 year old male patient with a medical history of permanent atrial fibrillation, prior permanent pacemaker implantation in 2010, previous CABG in the 1980s in [place], [place], OSA, diabetes mellitus type 2, and recent infection/osteomyelitis who now presents to [facility] on 10/14/2018 with MRSA bacteremia.  Over the past few years he has had several infections including infected toes on both feet. He had an episode of aspiration over a year ago where he aspirated 2L of fluid per the patient. His primary doctors were able to remove most of the fluid but, there was a residual fluid pocket that they watched for a year with serial chest xrays. When there was no change, no further recommendations were made.   This admission he did have his permanent pacemaker extracted due to the ongoing systemic infection.  Infectious disease was consulted and he has been on IV antibiotics including vancomycin and Rifampin.  Neurosurgery has been following the patient and obtained an MRI of the lumbar and thoracic spine on 10/17/2018.  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  The patient will eventually need a decompression at that level and extension of his fusion up into his lower thoracic spine.  However, neurosurgery would like to wait until his infection has cleared as much as possible before doing surgery.  There was a questionable empyema seen on the patient's MRI in the right posterior pleural space therefore, we  have ordered a CT of the chest without contrast to further evaluate.   The patient is a retired Primary school teacher who is married with children and grandchildren.      Current Activity/ Functional Status: Patient was independent with mobility/ambulation, transfers, ADL's, IADL's.   Zubrod Score: At the time of surgery this patients most appropriate activity status/level should be described as: []     0    Normal activity, no symptoms [x]     1    Restricted in physical strenuous activity but ambulatory, able to do out light work []     2    Ambulatory and capable of self care, unable to do work activities, up and about                 more than 50%  Of the time                            []     3    Only limited self care, in bed greater than 50% of waking hours []     4    Completely disabled, no self care, confined to bed or chair []     5    Moribund  Past Medical History:  Diagnosis Date   CAD (coronary artery disease)    CHF (congestive heart failure) (HCC)    COPD (chronic obstructive pulmonary disease) (HCC)    DDD (degenerative disc  disease), cervical    Diabetes mellitus without complication (HCC)    Hypertension    Osteomyelitis (HCC)    Permanent atrial fibrillation    RBBB    Sleep apnea    Tachycardia-bradycardia syndrome [facility](HCC)     Past Surgical History:  Procedure Laterality Date   BACK SURGERY     BUBBLE STUDY  10/13/2018   Procedure: BUBBLE STUDY;  Surgeon: Parke PoissonAcharya, Gayatri A, MD;  [facility];  Service: Cardiology;;   CORONARY ARTERY BYPASS GRAFT     [facility] [place]   IR LUMBAR DISC ASPIRATION W/IMG GUIDE  10/12/2018   LAMINECTOMY     PACEMAKER GENERATOR CHANGE  10/30/2015   SJM Assurity VR by [place]assil [place]   PACEMAKER IMPLANT  05/01/2009   SJM PPM implanted by Dr Desma PaganiniBassill [place]   PACEMAKER LEAD REMOVAL N/A 10/16/2018   Procedure: PACEMAKER EXTRACTION;  Surgeon:  Marinus Mawaylor, Gregg W, MD;  [facility];  Service: Cardiovascular;  Laterality: N/A;   TEE WITHOUT CARDIOVERSION N/A 10/13/2018   Procedure: TRANSESOPHAGEAL ECHOCARDIOGRAM (TEE);  Surgeon: Parke PoissonAcharya, Gayatri A, MD;  [facility];  Service: Cardiology;  Laterality: N/A;   TEE WITHOUT CARDIOVERSION N/A 10/16/2018   Procedure: TRANSESOPHAGEAL ECHOCARDIOGRAM (TEE);  Surgeon: Marinus Mawaylor, Gregg W, MD;  [facility];  Service: Cardiovascular;  Laterality: N/A;   TOE AMPUTATION      Social History   Tobacco Use  Smoking Status Current Some Day Smoker   Types: Cigarettes  Smokeless Tobacco Never Used    Social History   Substance and Sexual Activity  Alcohol Use Not Currently     Allergies  Allergen Reactions   Warfarin And Related Itching and Rash    Current Facility-Administered Medications  Medication Dose Route Frequency Provider Last Rate Last Dose   0.9 %  sodium chloride infusion   Intravenous PRN Marinus Mawaylor, Gregg W, MD   Stopped at 10/17/18 0617   acetaminophen (TYLENOL) tablet 325-650 mg  325-650 mg Oral Q4H PRN Marinus Mawaylor, Gregg W, MD   650 mg at 10/19/18 1128   acidophilus (RISAQUAD) capsule 1 capsule  1 capsule Oral Daily Marinus Mawaylor, Gregg W, MD   1 capsule at 10/19/18 0839   albuterol (PROVENTIL) (2.5 MG/3ML) 0.083% nebulizer solution 3 mL  3 mL Inhalation Q6H PRN Marinus Mawaylor, Gregg W, MD       atorvastatin (LIPITOR) tablet 40 mg  40 mg Oral Daily Marinus Mawaylor, Gregg W, MD   40 mg at 10/19/18 0839   cyclobenzaprine (FLEXERIL) tablet 10 mg  10 mg Oral BID PRN Marinus Mawaylor, Gregg W, MD   10 mg at 10/18/18 2143   enoxaparin (LOVENOX) injection 40 mg  40 mg Subcutaneous Q24H Marinus Mawaylor, Gregg W, MD   40 mg at 10/19/18 0842   furosemide (LASIX) tablet 40 mg  40 mg Oral BID Candelaria StagersGonfa, Taye T, MD   40 mg at 10/19/18 0840   hydrALAZINE (APRESOLINE) injection 5 mg  5 mg Intravenous Q2H PRN Marinus Mawaylor, Gregg W, MD       HYDROmorphone (DILAUDID) injection 0.5 mg  0.5 mg Intravenous Q4H PRN Candelaria StagersGonfa, Taye T, MD    0.5 mg at 10/19/18 1040   insulin aspart (novoLOG) injection 0-9 Units  0-9 Units Subcutaneous TID WC Marinus Mawaylor, Gregg W, MD   2 Units at 10/19/18 1127   lactated ringers infusion   Intravenous Continuous Marinus Mawaylor, Gregg W, MD 10 mL/hr at 10/16/18 1404     nicotine (NICODERM CQ -  dosed in mg/24 hours) patch 14 mg  14 mg Transdermal Daily Marinus Mawaylor, Gregg W, MD   14 mg at 10/19/18 0842   ondansetron (ZOFRAN) injection 4 mg  4 mg Intravenous Q6H PRN Marinus Mawaylor, Gregg W, MD       oxyCODONE-acetaminophen (PERCOCET/ROXICET) 5-325 MG per tablet 1 tablet  1 tablet Oral Q4H PRN Marinus Mawaylor, Gregg W, MD   1 tablet at 10/19/18 0839   pantoprazole (PROTONIX) EC tablet 40 mg  40 mg Oral BID Marinus Mawaylor, Gregg W, MD   40 mg at 10/19/18 0839   rifampin (RIFADIN) capsule 300 mg  300 mg Oral Q12H Blanchard Kelchixon, Stephanie N, NP   300 mg at 10/19/18 0852   sodium chloride flush (NS) 0.9 % injection 3 mL  3 mL Intravenous Q12H Marinus Mawaylor, Gregg W, MD   3 mL at 10/18/18 2152   vancomycin (VANCOCIN) IVPB 750 mg/150 ml premix  750 mg Intravenous Q24H Della GooSinclair, Emily S, [facility]        Medications Prior to Admission  Medication Sig Dispense Refill Last Dose   acetaminophen (TYLENOL) 325 MG tablet Take 2 tablets (650 mg total) by mouth every 4 (four) hours as needed for mild pain ((score 1 to 3) or temp > 100.5). 30 tablet 0 Past Month at Unknown time   albuterol (PROVENTIL HFA;VENTOLIN HFA) 108 (90 Base) MCG/ACT inhaler Inhale 1-2 puffs into the lungs every 6 (six) hours as needed for wheezing or shortness of breath.   Past Month at Unknown time   atorvastatin (LIPITOR) 40 MG tablet Take 40 mg by mouth daily.   Past Month at Unknown time   [employer] VITAMIN B12 1000 MCG tablet Take 1,000 mcg by mouth daily.   Past Month at Unknown time   cyclobenzaprine (FLEXERIL) 10 MG tablet Take 1 tablet (10 mg total) by mouth 2 (two) times daily as needed for muscle spasms. 30 tablet 0 Past Month at Unknown time   digoxin (LANOXIN) 0.125 MG tablet Take 0.125  mg by mouth daily.   Past Month at Unknown time   furosemide (LASIX) 40 MG tablet Take 40 mg by mouth daily.   Past Month at Unknown time   LEVEMIR FLEXTOUCH 100 UNIT/ML Pen Inject 15-20 Units into the skin every evening.   Past Month at Unknown time   metolazone (ZAROXOLYN) 2.5 MG tablet Take 2.5 mg by mouth daily as needed for fluid.   Past Month at Unknown time   metoprolol succinate (TOPROL-XL) 100 MG 24 hr tablet Take 100 mg by mouth 2 (two) times daily.   Past Month at UNKNOWN   nicotine (NICODERM CQ - DOSED IN MG/24 HOURS) 14 mg/24hr patch Place 1 patch (14 mg total) onto the skin daily. 28 patch 0 Past Month at Unknown time   pantoprazole (PROTONIX) 40 MG tablet Take 40 mg by mouth 2 (two) times daily.   Past Month at Unknown time   Probiotic Product (PROBIOTIC DAILY PO) Take 1 capsule by mouth daily.   Past Month at Unknown time   senna-docusate (SENOKOT-S) 8.6-50 MG tablet Take 1 tablet by mouth 2 (two) times daily as needed for mild constipation. 30 tablet 1 Past Month at Unknown time   sucralfate (CARAFATE) 1 g tablet Take 1 g by mouth 4 (four) times daily.   Past Month at Unknown time   traMADol (ULTRAM) 50 MG tablet Take 50 mg by mouth every 6 (six) hours as needed for pain.   Past Month at Unknown time   traZODone (DESYREL) 50  MG tablet Take 50 mg by mouth at bedtime as needed for sleep.   Past Month at Unknown time    History reviewed. No pertinent family history.   Review of Systems:   Review of Systems  Constitutional: Positive for fever, malaise/fatigue and weight loss.  Respiratory: Negative for cough, sputum production, shortness of breath and wheezing.   Cardiovascular: Positive for leg swelling. Negative for chest pain.  Neurological: Negative.    Pertinent items are noted in HPI.     Physical Exam: BP 130/86 (BP Location: Right Arm)    Pulse 78    Temp 98.2 F (36.8 C) (Oral)    Resp 18    Ht 6\' 2"  (1.88 m)    Wt 89.3 kg    SpO2 98%    BMI 25.28 kg/m      General appearance: alert, cooperative and no distress Resp: clear to auscultation bilaterally Cardio: irregular rhythm GI: soft, non-tender; bowel sounds normal; no masses,  no organomegaly Extremities: both feet wrapped. Great toe on each foot has been ambutated. Vascular changes with dark discoloration bilaterally. 2+ pitting lower extremity edema Neurologic: Grossly normal  Diagnostic Studies & Laboratory data:  CLINICAL DATA:  Pacemaker removal.  EXAM: CHEST - 2 VIEW  COMPARISON:  Two-view chest x-ray 07/12/2018  FINDINGS: Patient is status post median sternotomy for CABG. Left-sided pacemaker and wires have been removed. Asymmetric right lower lobe airspace disease is present. Small right pleural effusion is suspected. Lung volumes are low. Degenerative changes are noted at the right shoulder.  IMPRESSION: 1. Interval removal of pacemaker and leads. 2. Asymmetric right lower lobe airspace disease and effusion concerning for infection.   Electronically Signed   By: Marin Robertshristopher  Mattern M.D.   On: 10/17/2018 08:54     Recent Radiology Findings:   [place] Ekg Site Rite  Result Date: 10/18/2018 If Site Rite image not attached, placement could not be confirmed due to current cardiac rhythm.    I have independently reviewed the above radiologic studies and discussed with the patient   Recent Lab Findings: Lab Results  Component Value Date   WBC 9.6 10/18/2018   HGB 8.6 (L) 10/18/2018   HCT 29.1 (L) 10/18/2018   PLT 340 10/18/2018   GLUCOSE 132 (H) 10/19/2018   CHOL 86 10/13/2018   TRIG 113 10/13/2018   HDL 22 (L) 10/13/2018   LDLCALC 41 10/13/2018   ALT 7 10/19/2018   AST 15 10/19/2018   NA 138 10/19/2018   K 3.7 10/19/2018   CL 100 10/19/2018   CREATININE 0.85 10/19/2018   BUN 12 10/19/2018   CO2 27 10/19/2018   INR 1.2 10/11/2018   HGBA1C 6.2 (H) 10/13/2018      Assessment / Plan:      1. MRSA bacteremia- (likely originating from his foot  infection) Continue IV Vanc and PO Rifampin. ID plans for 6-8 weeks of antibiotics.  2. Pacemaker thrombus vs vegetation-Rate-controlled atrial fibrillation. s/p PPM extraction on 6/15 due to systemic infection. His PPM was originally placed due to tachy brady syndrome-EP following along. 3. Lumbar discitis-will need surgery eventually but neurosurgery would like infection to resolve. 4. Empyema-seen on MRI which is not the best study to evaluate. Ordered CT of the chest without contrast for further investigation. 5. Left foot infection- continue antibiotics, ID following 6. Diabetes Mellitus Type 2-on SSI with Levemir on hold.  7. Essential hypertension-IV hydralazine prn. Metoprolol on hold for bradycardia. 8. Chronic diastolic CHF- continue diuretics per primary. EF is  55-60% on last TTE performed on 10/11/2018.   Plan: Will review CT of the chest when available. Consider percutaneous drainage and possible biopsy if not a surgical candidate. Discussed VATS with drainage of an empyema with the patient vs. Percutaneous drainage. All questions were answered to the patient's satisfaction.    Nicholes Rough, PA-C  10/19/2018 1:06 PM   Ct Chest Wo Contrast  Result Date: 10/19/2018 CLINICAL DATA:  Evaluate airspace disease EXAM: CT CHEST WITHOUT CONTRAST TECHNIQUE: Multidetector CT imaging of the chest was performed following the standard protocol without IV contrast. COMPARISON:  Chest radiograph, 10/17/2018 FINDINGS: Cardiovascular: Aortic atherosclerosis. Cardiomegaly. Three-vessel coronary artery calcifications and/or stents. No pericardial effusion. Mediastinum/Nodes: No enlarged mediastinal, hilar, or axillary lymph nodes. Thyroid gland, trachea, and esophagus demonstrate no significant findings. Lungs/Pleura: There is a small right pleural effusion, which appears loculated with adjacent pleural thickening and probable round atelectasis (series 4, image 95). Small, simple appearing left pleural  effusion. Upper Abdomen: No acute abnormality. Musculoskeletal: No chest wall mass or suspicious bone lesions identified. Severe, destructive arthrosis of the right glenoid. IMPRESSION: 1. There is a small right pleural effusion, which appears loculated with adjacent pleural thickening and probable round atelectasis (series 4, image 95). Small, simple appearing left pleural effusion. 2.  Coronary artery disease and aortic atherosclerosis. Electronically Signed   By: Eddie Candle M.D.   On: 10/19/2018 13:07   MRI also reviewed     Recommend ct directed aspiration/biopsy of what on MRI looks more like loculated Mcpherson rather then "round " atelectatics With tissue for path and culture  I have seen and examined Henry Mcpherson and agree with the above assessment  and plan.  Grace Isaac MD Beeper [phone] Office [phone] 10/19/2018 6:03 PM

## 2018-10-19 NOTE — Plan of Care (Signed)
  Problem: Pain Managment: Goal: General experience of comfort will improve Outcome: Progressing   

## 2018-10-19 NOTE — Progress Notes (Signed)
Patient had a conversation with his wife and about his update. Patient states that he will be having a conversation with his wife and surgeon at 4 p.m. today.

## 2018-10-19 NOTE — Progress Notes (Addendum)
PROGRESS NOTE  Henry Mcpherson NLZ:[id number] DOB: 1956/01/10 DOA: 10/10/2018 PCP: System, Pcp Not In   LOS: 9 days   Patient is from: Home  Brief Narrative / Interim history: 63 y.o. male with medical history significant ofsCHF with EF 45%, A. 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  Patient had surgical resection of left foot by podiatry at OSH.  On day 11 of vancomycin on day of transfer.  Repeat blood culture reportedly negative.  He states that he continues to have back pain, bilateral leg weakness, difficulty walking.  He states that he has decreased sensation in both legs which he attributes to diabetic neuropathy.  Denies new bowel or bladder issues other than usual poor bowel control.  Reportedly had lumbar X-ray 3 weeks ago, and finding was concerning for possible infection as per Dr. Windy Carina review.  On arrival here, hemodynamically stable.  Hemoglobin 7.8 (about baseline).  Electrolytes not impressive.  COVID-19 negative.  Per admitting provider, Dr. Ellene Route from neurosurgery consulted on arrival.  CT lumbar spine showed superior migration of left L4 screw, loosening of left L3 screw, discitis at T12-L1 with regional osteomyelitis and possible discitis at L3-4 and L5-S1.  Patient had IR fluoroscopy guided biopsy of the thoracolumbar discitis on 10/12/2018.  Cultures were sent.  He also had TTE which was concerning for pacemaker lead vegetation versus thrombus.  Pacemaker extracted by cardiology on 6/15.   MRI of thoracic and lumbar spine on 6/16 -showed chronic T12-L1 osteomyelitis/discitis, concern for L3-L4 and L5-S1 discitis, severe spinal canal stenosis at T12-L1, residual moderate to severe  bilateral neuroforaminal stenosis at L4-L5 and L5-S1.  No abscess noted.   Subjective: No major events overnight of this morning.  Back pain improved.  Continues to have good urine output.  Edema improving.  Denies chest pain, dyspnea, palpitation, and GI, GU or neuro neuro symptoms.  Assessment & Plan: MRSA bacteremia: Likely from left foot infection. -Infectious disease following. -ESR and CRP 50/1.5 respectively. -Repeat blood culture at OSH reportedly negative  -Repeat blood culture on 10/11/2018 here negative so far. -Continue IV vancomycin-started at OSH 11 days prior to transfer -Rifampin added per ID on 6/10 -Plan for 6 to 8 weeks of IV antibiotics given discitis and osteomyelitis. -Ordered PICC line placement.  Possible pacemaker lead vegetation versus thrombus -TEE showed 2.1 x 0.4 cm lesion in right atrium attached to the pacemaker lead -Pacemaker extracted on 6/15.  No cardiopulmonary symptoms or significant event since extraction. -Cardiology/EP following  T12-L1, L3-L4 and L5-S1 discitis T12-L1 osteomyelitis History of cauda equina syndrome  T12-L1 severe spinal canal stenosis L4-L5 and L5-S1 moderate to severe bilateral neuroforaminal stenosis -s/p of lumbar decompression and fusion by Dr. Saintclair Halsted on 3/4.  -CT and MRI as above -Status post IR fluoroscopy guided biopsy of the discitis on 10/12/2018 -Tissue culture including fungal cultures negative but this is about 12 days after he started antibiotic. -Follow AFB culture.  AFB smear negative. -Plan for 6 to 8 weeks of IV antibiotic per ID -Neurosurgery planning further decompression after further discussion with ID and cardiology -Antibiotic as above -PRN Dilaudid, Percocet and Flexeril abdominal for pain. -Bowel regimen  Left foot infection-great toe: s/p of amputation by podiatry at outside hospital.  -Antibiotic as above -Appreciate wound  care input  Small loculated right pleural effusion with adjacent pleural  thickening and probable atelectasis: Patient with history of empyema per wife.  Patient has bilateral symptoms. -CTS consulted by cardiology and following.  Chronic diastolic CHF:  TTE on 9/83 with EF of 55 to 60% (improved), moderate LVH, indeterminate DD, moderately reduced RVSP no vegetation.  On 07/05/2018 showed EF 45-50%.  No cardiopulmonary symptoms.  Edema improving.  Renal function stable -Increased p.o. Lasix to 40 mg twice daily on 6/16 (on 40 mg daily at home) -Daily weight, intake output and renal function -Monitor electrolytes closely and replenish  Permanent atrial Fibrillation: Currently rate controlled.  CHA2DS2-VASc Score-5 but not on AC due to GI bleed -Metoprolol and digoxin discontinued by cardiology -Pacemaker extracted on 6/15 due to agitation -Closely monitor electrolytes and replenish aggressively.  CAD (coronary artery disease): s/p of stent and CABG. No Cp. -Continue lipitor  Chronic obstructive lung disease ([place]): stable. -prn albuterol nebs  Controlled IDDM-2 with complications, diabetic foot infection: on  NovoLog and Levemir 20 units at home.  A1c 6.2.  CBG within appropriate range. -SSI -Levemir on hold  Essential hypertension: Normotensive -IV Hydralazine prn -P.o. Lasix as above  Chronic blood loss anemia and anemia of chronic disease:  FOBT positive.  Hb 8.6 (about baseline).  -protonix 40 bid -Monitor H&H  Stage II pressure injuries over his left buttock: POA and stable. -Appreciate wound care input.  Scheduled Meds: . acidophilus  1 capsule Oral Daily  . atorvastatin  40 mg Oral Daily  . enoxaparin (LOVENOX) injection  40 mg Subcutaneous Q24H  . furosemide  40 mg Oral BID  . insulin aspart  0-9 Units Subcutaneous TID WC  . nicotine  14 mg Transdermal Daily  . pantoprazole  40 mg Oral BID  . rifampin  300 mg Oral Q12H  . sodium chloride flush  3 mL Intravenous Q12H   Continuous Infusions: . sodium chloride Stopped (10/17/18 0617)   . lactated ringers 10 mL/hr at 10/16/18 1404  . vancomycin 1,000 mg (10/18/18 2151)   PRN Meds:.sodium chloride, acetaminophen, albuterol, cyclobenzaprine, hydrALAZINE, HYDROmorphone (DILAUDID) injection, ondansetron (ZOFRAN) IV, oxyCODONE-acetaminophen   DVT prophylaxis: SCD given chronic GI bleed/FOBT positive Code Status: Full code Family Communication: Patient updated family and let me know if any question Disposition Plan: Remains inpatient for MRSA bacteremia, discitis, osteomyelitis and severe spinal canal and foraminal stenosis  Consultants:   Neurosurgery  Infectious disease  EP/cardiology  CTS  IR  Procedures:   IR fluoroscopy guided biopsy of thoracolumbar discitis on 6/11  TEE on 6/12  Pacemaker extraction on 6/15.  Microbiology: . Initial blood culture at outside hospital reportedly MRSA . Repeat blood cultures outside hospital reportedly negative . Blood culture on 10/10/2018-negative so far . MRSA PCR negative . COVID-19 negative . Tissue cultures negative so far . Tissue fungal cultures negative so far . Tissue AFB culture pending  Antimicrobials: Anti-infectives (From admission, onward)   Start     Dose/Rate Route Frequency Ordered Stop   10/17/18 1100  rifampin (RIFADIN) capsule 300 mg     300 mg Oral Every 12 hours 10/17/18 1043     10/17/18 0000  ceFAZolin (ANCEF) IVPB 1 g/50 mL premix     1 g 100 mL/hr over 30 Minutes Intravenous Every 6 hours 10/16/18 2108 10/17/18 1324   10/16/18 1315  gentamicin (GARAMYCIN) 80 mg in sodium chloride 0.9 % 500 mL irrigation  Status:  Discontinued     80 mg Irrigation To [facility]Stay [facility]  10/16/18 0832 10/16/18 2059   10/16/18 1315  ceFAZolin (ANCEF) IVPB 2g/100 mL premix     2 g 200 mL/hr over 30 Minutes Intravenous To [facility]Stay Surgical 10/16/18 0832 10/16/18 1840   10/11/18 1400  rifampin (RIFADIN) capsule 300 mg  Status:  Discontinued     300 mg Oral Every 8 hours 10/11/18 1100 10/17/18 1043   10/10/18  2130  vancomycin (VANCOCIN) IVPB 1000 mg/200 mL premix     1,000 mg 200 mL/hr over 60 Minutes Intravenous Every 24 hours 10/10/18 2126        Objective: Vitals:   10/18/18 0946 10/18/18 1620 10/18/18 2042 10/19/18 0532  BP: 137/60 (!) 151/64 132/81 137/83  Pulse: (!) 46 (!) 54 (!) 103 77  Resp: 18 18 16 16   Temp: 98.4 F (36.9 C) 98.1 F (36.7 C) 98.5 F (36.9 C) 99 F (37.2 C)  TempSrc: Oral Oral Oral Oral  SpO2: 100% 97% 99% 94%  Weight:      Height:        Intake/Output Summary (Last 24 hours) at 10/19/2018 0707 Last data filed at 10/19/2018 0601 Gross per 24 hour  Intake 800.07 ml  Output 1850 ml  Net -1049.93 ml   Filed Weights   10/15/18 0600 10/16/18 0712 10/18/18 0439  Weight: 88 kg 86.4 kg 89.3 kg    Examination:.  GENERAL: No acute distress.  Appears well.  HEENT: MMM.  Vision and hearing grossly intact.  NECK: Supple.  No apparent JVD LUNGS:  No IWOB. Good air movement bilaterally. HEART:  RRR. Heart sounds normal.  ABD: Bowel sounds present. Soft. Non tender.  MSK/EXT: Bilateral lower extremity weakness.  2+ pitting edema in lower extremities.  Dependent edema in upper extremities. SKIN: Stage II pressure skin injuries over right buttock as below. NEURO: Awake, alert and oriented appropriately.  No gross deficit except for unchanged bilateral lower extremity weakness and symmetric diminished light sensation. Patellar reflex symmetric. PSYCH: Calm. Normal affect.  Picture on 10/19/2018   Picture on 10/12/2018      Data Reviewed: I have independently reviewed following labs and imaging studies  CBC: Recent Labs  Lab 10/14/18 0716 10/16/18 0931 10/16/18 2020 10/18/18 0629  WBC 11.6* 12.0* 13.3* 9.6  HGB 8.0* 9.3* 9.0* 8.6*  HCT 26.3* 31.7* 30.5* 29.1*  MCV 85.9 87.3 88.4 87.4  PLT 350 394 369 749   Basic Metabolic Panel: Recent Labs  Lab 10/14/18 0716 10/15/18 0735 10/16/18 0931 10/16/18 2020 10/17/18 0654 10/18/18 0629  NA 137 139  138  --  137 137  K 3.1* 3.4* 3.5  --  3.7 3.6  CL 104 105 104  --  104 102  CO2 25 26 27   --  25 26  GLUCOSE 146* 105* 179*  --  177* 185*  BUN 18 16 13   --  13 14  CREATININE 0.69 0.71 0.66 0.66 0.75 0.83  CALCIUM 8.1* 8.3* 8.9  --  8.8* 8.9  MG  --  1.2* 1.7  --  1.9 1.6*   GFR: Estimated Creatinine Clearance: 103.2 mL/min (by C-G formula based on SCr of 0.83 mg/dL). Liver Function Tests: Recent Labs  Lab 10/16/18 2020  ALBUMIN 2.1*   No results for input(s): LIPASE, AMYLASE in the last 168 hours. No results for input(s): AMMONIA in the last 168 hours. Coagulation Profile: No results for input(s): INR, PROTIME in the last 168 hours. Cardiac Enzymes: No results for input(s): CKTOTAL, CKMB, CKMBINDEX, TROPONINI in the last 168 hours. BNP (last  3 results) No results for input(s): PROBNP in the last 8760 hours. HbA1C: No results for input(s): HGBA1C in the last 72 hours. CBG: Recent Labs  Lab 10/18/18 0654 10/18/18 1121 10/18/18 1617 10/18/18 2040 10/19/18 0648  GLUCAP 162* 159* 159* 153* 122*   Lipid Profile: No results for input(s): CHOL, HDL, LDLCALC, TRIG, CHOLHDL, LDLDIRECT in the last 72 hours. Thyroid Function Tests: No results for input(s): TSH, T4TOTAL, FREET4, T3FREE, THYROIDAB in the last 72 hours. Anemia Panel: No results for input(s): VITAMINB12, FOLATE, FERRITIN, TIBC, IRON, RETICCTPCT in the last 72 hours. Urine analysis:    Component Value Date/Time   COLORURINE YELLOW 07/08/2018 0224   APPEARANCEUR CLEAR 07/08/2018 0224   LABSPEC 1.011 07/08/2018 0224   PHURINE 6.0 07/08/2018 0224   GLUCOSEU NEGATIVE 07/08/2018 0224   HGBUR SMALL (A) 07/08/2018 0224   BILIRUBINUR NEGATIVE 07/08/2018 0224   KETONESUR NEGATIVE 07/08/2018 0224   PROTEINUR NEGATIVE 07/08/2018 0224   NITRITE NEGATIVE 07/08/2018 0224   LEUKOCYTESUR SMALL (A) 07/08/2018 0224   Sepsis Labs: Invalid input(s): PROCALCITONIN, LACTICIDVEN  Recent Results (from the past 240 hour(s))   Culture, blood (Routine X 2) w Reflex to ID Panel     Status: None   Collection Time: 10/10/18  7:55 PM   Specimen: BLOOD  Result Value Ref Range Status   Specimen Description BLOOD LEFT ARM  Final   Special Requests   Final    BOTTLES DRAWN AEROBIC ONLY Blood Culture adequate volume   Culture   Final    NO GROWTH 5 DAYS Performed at [facility], [address] [place], [place] [address]    Report Status 10/15/2018 FINAL  Final  Culture, blood (Routine X 2) w Reflex to ID Panel     Status: None   Collection Time: 10/10/18  8:00 PM   Specimen: BLOOD  Result Value Ref Range Status   Specimen Description BLOOD LEFT ARM  Final   Special Requests   Final    BOTTLES DRAWN AEROBIC ONLY Blood Culture adequate volume   Culture   Final    NO GROWTH 5 DAYS Performed at [facility], [place] [address], [place], [place] [address]    Report Status 10/15/2018 FINAL  Final  Surgical pcr screen     Status: None   Collection Time: 10/11/18 12:21 AM   Specimen: Nasal Mucosa; Nasal Swab  Result Value Ref Range Status   MRSA, PCR NEGATIVE NEGATIVE Final   Staphylococcus aureus NEGATIVE NEGATIVE Final    Comment: (NOTE) The Xpert SA Assay (FDA approved for NASAL specimens in patients 71 years of age and older), is one component of a comprehensive surveillance program. It is not intended to diagnose infection nor to guide or monitor treatment. Performed at [facility], [place] [address], [place], [place] [address]   SARS Coronavirus 2     Status: None   Collection Time: 10/11/18 12:23 AM  Result Value Ref Range Status   SARS Coronavirus 2 NOT DETECTED NOT DETECTED Final    Comment: (NOTE) SARS-CoV-2 target nucleic acids are NOT DETECTED. The SARS-CoV-2 RNA is generally detectable in upper and lower respiratory specimens during the acute phase of infection.  Negative  results do not preclude SARS-CoV-2 infection, do not rule out co-infections with other pathogens, and should  not be used as the sole basis for treatment or other patient management decisions.  Negative results must be combined with clinical observations, patient history, and epidemiological information. The expected result  is Not Detected. Fact Sheet for Patients: http://www.biofiredefense.com/wp-content/uploads/2020/03/BIOFIRE-COVID -19-patients.pdf Fact Sheet for Healthcare Providers: http://www.biofiredefense.com/wp-content/uploads/2020/03/BIOFIRE-COVID -19-hcp.pdf This test is not yet approved or cleared by the [place] and  has been authorized for detection and/or diagnosis of SARS-CoV-2 by FDA under an Emergency Use Authorization (EUA).  This EUA will remain in effec t (meaning this test can be used) for the duration of  the COVID-19 declaration under Section 564(b)(1) of the Act, 21 U.S.C. section 360bbb-3(b)(1), unless the authorization is terminated or revoked sooner. Performed at [facility], [place] [address], [place], [place] [address]   Body fluid culture     Status: None   Collection Time: 10/12/18 11:15 AM   Specimen: Fluid; Wound  Result Value Ref Range Status   Specimen Description FLUID  Final   Special Requests NONE  Final   Gram Stain   Final    RARE WBC PRESENT, PREDOMINANTLY PMN NO ORGANISMS SEEN    Culture   Final    NO GROWTH Performed at [facility], [place] [address] [place], [place] [address]    Report Status 10/15/2018 FINAL  Final  Acid Fast Smear (AFB)     Status: None   Collection Time: 10/12/18 11:15 AM   Specimen: Fluid; Wound  Result Value Ref Range Status   AFB Specimen Processing Concentration  Final   Acid Fast Smear Negative  Final    Comment: (NOTE) Performed At: [facility] [place], [place] [id number] Rush Farmer MD [id number]    Source (AFB) FLUID  Final    Comment: Performed at [facility], [place] [address] [place], [place] [address]  Culture, fungus without smear     Status:  None (Preliminary result)   Collection Time: 10/12/18 11:15 AM   Specimen: Fluid  Result Value Ref Range Status   Specimen Description FLUID  Final   Special Requests NONE  Final   Culture   Final    NO FUNGUS ISOLATED AFTER 3 DAYS Performed at [place] [address] [place], [place] [address]    Report Status PENDING  Incomplete  Surgical PCR screen     Status: None   Collection Time: 10/16/18  8:33 AM   Specimen: Nasal Mucosa; Nasal Swab  Result Value Ref Range Status   MRSA, PCR NEGATIVE NEGATIVE Final   Staphylococcus aureus NEGATIVE NEGATIVE Final    Comment: (NOTE) The Xpert SA Assay (FDA approved for NASAL specimens in patients 38 years of age and older), is one component of a comprehensive surveillance program. It is not intended to diagnose infection nor to guide or monitor treatment. Performed at [facility], [place] [address], [place], [place] [address]       Radiology Studies: [place] Ekg Site Rite  Result Date: 10/18/2018 If [facility] image not attached, placement could not be confirmed due to current cardiac rhythm.   Taye T. [facility] Pager [phone]  If 7PM-7AM, please contact night-coverage [email or web address] Password [email or web address] 10/19/2018, 7:07 AM

## 2018-10-19 NOTE — Progress Notes (Addendum)
Electrophysiology Rounding Note  Patient Name: Henry Mcpherson Date of Encounter: 10/19/2018  Primary Cardiologist: Blanch Media [facility] [place], [place]) Electrophysiologist: Odell Fasching   Subjective   The patient is doing ok today.  At this time, the patient denies chest pain, shortness of breath, or any new concerns   Inpatient Medications    Scheduled Meds:  acidophilus  1 capsule Oral Daily   atorvastatin  40 mg Oral Daily   enoxaparin (LOVENOX) injection  40 mg Subcutaneous Q24H   furosemide  40 mg Oral BID   insulin aspart  0-9 Units Subcutaneous TID WC   nicotine  14 mg Transdermal Daily   pantoprazole  40 mg Oral BID   potassium chloride  40 mEq Oral Once   rifampin  300 mg Oral Q12H   sodium chloride flush  3 mL Intravenous Q12H   Continuous Infusions:  sodium chloride Stopped (10/17/18 0617)   lactated ringers 10 mL/hr at 10/16/18 1404   magnesium sulfate bolus IVPB     vancomycin     PRN Meds: sodium chloride, acetaminophen, albuterol, cyclobenzaprine, hydrALAZINE, HYDROmorphone (DILAUDID) injection, ondansetron (ZOFRAN) IV, oxyCODONE-acetaminophen   Vital Signs    Vitals:   10/18/18 1620 10/18/18 2042 10/19/18 0532 10/19/18 0852  BP: (!) 151/64 132/81 137/83 130/86  Pulse: (!) 54 (!) 103 77 78  Resp: 18 16 16 18   Temp: 98.1 F (36.7 C) 98.5 F (36.9 C) 99 F (37.2 C) 98.2 F (36.8 C)  TempSrc: Oral Oral Oral Oral  SpO2: 97% 99% 94% 98%  Weight:      Height:        Intake/Output Summary (Last 24 hours) at 10/19/2018 0906 Last data filed at 10/19/2018 0630 Gross per 24 hour  Intake 560.07 ml  Output 1851 ml  Net -1290.93 ml   Filed Weights   10/15/18 0600 10/16/18 0712 10/18/18 0439  Weight: 88 kg 86.4 kg 89.3 kg    Physical Exam    GEN- The patient is elderly and chronically ill appearing, alert and oriented x 3 today. Appears much older than his stated age.f Head- normocephalic, atraumatic Eyes-  Sclera clear, conjunctiva pink Ears-  hearing intact Oropharynx- clear Neck- supple Lungs- Clear to ausculation bilaterally, normal work of breathing Heart- Irregular rate and rhythm  GI- soft, NT, ND, + BS Extremities- no clubbing, cyanosis, or edema Skin- no rash or lesion Psych- euthymic mood, full affect Neuro- strength and sensation are intact  Labs    CBC Recent Labs    10/16/18 2020 10/18/18 0629  WBC 13.3* 9.6  HGB 9.0* 8.6*  HCT 30.5* 29.1*  MCV 88.4 87.4  PLT 369 340   Basic Metabolic Panel Recent Labs    54/00/86 0629 10/19/18 0717  NA 137 138  K 3.6 3.7  CL 102 100  CO2 26 27  GLUCOSE 185* 132*  BUN 14 12  CREATININE 0.83 0.85  CALCIUM 8.9 9.0  MG 1.6* 1.7   Liver Function Tests Recent Labs    10/16/18 2020  ALBUMIN 2.1*     Telemetry    Sinus rhythm, frequent PAC's, AT (personally reviewed)  Radiology    Mr Thoracic Spine W Wo Contrast  Result Date: 10/17/2018 CLINICAL DATA:  Back pain.  T12-L1 osteomyelitis discitis. EXAM: MRI THORACIC AND LUMBAR SPINE WITHOUT AND WITH CONTRAST TECHNIQUE: Multiplanar and multiecho pulse sequences of the thoracic and lumbar spine were obtained without and with intravenous contrast. CONTRAST:  9 mm Gadavist intravenous contrast. COMPARISON:  Chest x-ray from same day.  CT lumbar spine dated October 11, 2018. FINDINGS: MRI THORACIC SPINE FINDINGS Alignment:  Physiologic. Vertebrae: No fracture, evidence of discitis, or bone lesion. T10 hemangioma. Cord:  Normal signal.  No intradural enhancement. Paraspinal and other soft tissues: Thick-walled 3.0 x 5.8 cm rim enhancing fluid collection in the right posterior pleural space, concerning for empyema. Adjacent consolidation in the right lower lobe. Prominent right greater than left paravertebral fat extending from T6-T11 with smooth, lobulated margins, potentially reflecting burnt out extramedullary hematopoiesis. Disc levels: T1-T2: Tiny left paracentral disc protrusion.  No stenosis. T2-T3: Negative. T3-T4:  Small left paracentral disc osteophyte complex with slight mass effect on the left ventral cord. No stenosis. T4-T5: Negative. T5-T6: Tiny left paracentral disc protrusion.  No stenosis. T6-T7: Negative. T7-T8: Central disc extrusion with disc material migrating inferiorly posterior to the T8 vertebral body to T8-T9. Flattening of the ventral cord. No stenosis. T8-T9: Central disc extrusion with flattening of the ventral cord. Extruded disc material behind the T9 vertebral body. No stenosis. T9-T10: Negative. T10-T11: Small central disc protrusion.  No stenosis. T11-T12: Mild disc bulging eccentric to the left. Borderline mild spinal canal stenosis. Mild left neuroforaminal stenosis. MRI LUMBAR SPINE FINDINGS Segmentation:  Standard. Alignment: Unchanged dextroscoliosis. Unchanged trace anterolisthesis at L4-L5. Vertebrae: Fluid and rim enhancement within the T12-L1, L3-L4, and L5-S1 disc spaces. There is some associated endplate irregularity, most prominent at T12-L1. There is no significant vertebral body edema or enhancement. Fluid within the L1-L2 and L4-L5 disc spaces without definite endplate irregularity or enhancement. No fracture or focal bone lesion. Conus medullaris: Extends to the L1 level and appears normal. No intradural enhancement. Paraspinal and other soft tissues: Left psoas muscle edema from L1-L3. No paravertebral or epidural abscess. Disc levels: T12-L1: Unchanged disc space widening with endplate irregularity and posterior endplate spurring resulting in severe spinal canal stenosis and moderate left greater than right neuroforaminal stenosis. L1-L2: Diffuse disc bulging with endplate spurring. Mild spinal canal and bilateral neuroforaminal stenosis. L2-L3: Chronic severe disc height loss with endplate spurring. Prior posterior decompression. Residual moderate left lateral recess stenosis. No spinal canal or neuroforaminal stenosis. L3-L4: Prior posterior decompression and fusion. Unchanged disc  space widening with diffuse disc bulging and endplate spurring. Residual mild left lateral recess stenosis and moderate left neuroforaminal stenosis. L4-L5: Prior posterior decompression and fusion. Chronic circumferential disc osteophyte complex. Residual moderate to severe bilateral neuroforaminal stenosis. No spinal canal stenosis. L5-S1: Prior posterior decompression and fusion. Unchanged mild widening of the disc space and circumferential disc osteophyte complex. Residual severe right and moderate to severe left neuroforaminal stenosis. No spinal canal stenosis. IMPRESSION: Thoracic spine: 1. Thick walled 3.0 x 5.8 cm rim enhancing fluid collection in the right posterior pleural space, concerning for empyema. Adjacent right lower lobe consolidation concerning for pneumonia. Recommend dedicated chest CT with contrast for further evaluation. 2. Multilevel thoracic spondylosis as described above. Disc extrusions at T7-T8 and T8-T9 with extruded disc material posterior to the T8 and T9 vertebral bodies. Mass effect on the ventral cord at T7-T8 and T8-T9. No high-grade stenosis. Lumbar spine: 1. Chronic T12-L1 osteomyelitis discitis. 2. Prominent fluid within the L3-L4 and L5-S1 disc spaces with rim enhancement and mild endplate irregularity, concerning for indolent discitis. 3. No paravertebral or epidural abscess. 4. Multilevel lumbar spondylosis as described above. Severe spinal canal stenosis at T12-L1. 5. Prior L2-S1 posterior decompression and L3-S1 posterior fusion. Residual moderate to severe bilateral neuroforaminal stenosis at L4-L5 and L5-S1. Electronically Signed   By: Vickki HearingWilliam T Derry M.D.  On: 10/17/2018 15:12   Mr Lumbar Spine W Wo Contrast  Result Date: 10/17/2018 CLINICAL DATA:  Back pain.  T12-L1 osteomyelitis discitis. EXAM: MRI THORACIC AND LUMBAR SPINE WITHOUT AND WITH CONTRAST TECHNIQUE: Multiplanar and multiecho pulse sequences of the thoracic and lumbar spine were obtained without and  with intravenous contrast. CONTRAST:  9 mm Gadavist intravenous contrast. COMPARISON:  Chest x-ray from same day. CT lumbar spine dated October 11, 2018. FINDINGS: MRI THORACIC SPINE FINDINGS Alignment:  Physiologic. Vertebrae: No fracture, evidence of discitis, or bone lesion. T10 hemangioma. Cord:  Normal signal.  No intradural enhancement. Paraspinal and other soft tissues: Thick-walled 3.0 x 5.8 cm rim enhancing fluid collection in the right posterior pleural space, concerning for empyema. Adjacent consolidation in the right lower lobe. Prominent right greater than left paravertebral fat extending from T6-T11 with smooth, lobulated margins, potentially reflecting burnt out extramedullary hematopoiesis. Disc levels: T1-T2: Tiny left paracentral disc protrusion.  No stenosis. T2-T3: Negative. T3-T4: Small left paracentral disc osteophyte complex with slight mass effect on the left ventral cord. No stenosis. T4-T5: Negative. T5-T6: Tiny left paracentral disc protrusion.  No stenosis. T6-T7: Negative. T7-T8: Central disc extrusion with disc material migrating inferiorly posterior to the T8 vertebral body to T8-T9. Flattening of the ventral cord. No stenosis. T8-T9: Central disc extrusion with flattening of the ventral cord. Extruded disc material behind the T9 vertebral body. No stenosis. T9-T10: Negative. T10-T11: Small central disc protrusion.  No stenosis. T11-T12: Mild disc bulging eccentric to the left. Borderline mild spinal canal stenosis. Mild left neuroforaminal stenosis. MRI LUMBAR SPINE FINDINGS Segmentation:  Standard. Alignment: Unchanged dextroscoliosis. Unchanged trace anterolisthesis at L4-L5. Vertebrae: Fluid and rim enhancement within the T12-L1, L3-L4, and L5-S1 disc spaces. There is some associated endplate irregularity, most prominent at T12-L1. There is no significant vertebral body edema or enhancement. Fluid within the L1-L2 and L4-L5 disc spaces without definite endplate irregularity or  enhancement. No fracture or focal bone lesion. Conus medullaris: Extends to the L1 level and appears normal. No intradural enhancement. Paraspinal and other soft tissues: Left psoas muscle edema from L1-L3. No paravertebral or epidural abscess. Disc levels: T12-L1: Unchanged disc space widening with endplate irregularity and posterior endplate spurring resulting in severe spinal canal stenosis and moderate left greater than right neuroforaminal stenosis. L1-L2: Diffuse disc bulging with endplate spurring. Mild spinal canal and bilateral neuroforaminal stenosis. L2-L3: Chronic severe disc height loss with endplate spurring. Prior posterior decompression. Residual moderate left lateral recess stenosis. No spinal canal or neuroforaminal stenosis. L3-L4: Prior posterior decompression and fusion. Unchanged disc space widening with diffuse disc bulging and endplate spurring. Residual mild left lateral recess stenosis and moderate left neuroforaminal stenosis. L4-L5: Prior posterior decompression and fusion. Chronic circumferential disc osteophyte complex. Residual moderate to severe bilateral neuroforaminal stenosis. No spinal canal stenosis. L5-S1: Prior posterior decompression and fusion. Unchanged mild widening of the disc space and circumferential disc osteophyte complex. Residual severe right and moderate to severe left neuroforaminal stenosis. No spinal canal stenosis. IMPRESSION: Thoracic spine: 1. Thick walled 3.0 x 5.8 cm rim enhancing fluid collection in the right posterior pleural space, concerning for empyema. Adjacent right lower lobe consolidation concerning for pneumonia. Recommend dedicated chest CT with contrast for further evaluation. 2. Multilevel thoracic spondylosis as described above. Disc extrusions at T7-T8 and T8-T9 with extruded disc material posterior to the T8 and T9 vertebral bodies. Mass effect on the ventral cord at T7-T8 and T8-T9. No high-grade stenosis. Lumbar spine: 1. Chronic T12-L1  osteomyelitis discitis. 2. Prominent  fluid within the L3-L4 and L5-S1 disc spaces with rim enhancement and mild endplate irregularity, concerning for indolent discitis. 3. No paravertebral or epidural abscess. 4. Multilevel lumbar spondylosis as described above. Severe spinal canal stenosis at T12-L1. 5. Prior L2-S1 posterior decompression and L3-S1 posterior fusion. Residual moderate to severe bilateral neuroforaminal stenosis at L4-L5 and L5-S1. Electronically Signed   By: Titus Dubin M.D.   On: 10/17/2018 15:12   [place] Ekg Site Rite  Result Date: 10/18/2018 If Site Rite image not attached, placement could not be confirmed due to current cardiac rhythm.    Patient Profile   63 y.o. male with PMHx of chronic CHF (systolic), AFib (described as permanent), tachy-brady w/PPM, COPD, DM, PUD, GIB (not on a/c), CAD w/prior CABG, lumbar stenosis w/cauda equina syndrome, s/p lumbar decompression March this year, L great toe infection s/p amputation recently now admitted with MRSA bacteremia.  S/p PPM extraction this admission by Dr Lovena Le.  Assessment & Plan    1.  MRSA bacteremia S/p PPM system extraction Will need sutures removed around 6/24 (EP will do if still in house) Antibiotics per ID  2.  Tachy brady syndrome Tele ok Will avoid re-implant as long as possible Previously not felt to be a candidate for [facility]  3.  Lumbar discitis Dr. Rayann Heman discussed with Dr Saintclair Halsted this morning - ok to proceed with surgery without replacement of pacemaker when felt stable by ID and neurosurgery.  4.  Empyema Dr Rayann Heman discussed with Dr Servando Snare. He will review films and make decision regarding further work up.      For questions or updates, please contact [place] Please consult [email or web address] for contact info under Cardiology/STEMI.  Signed, Chanetta Marshall, NP  10/19/2018, 9:06 AM   I have spoken at length with Dr Servando Snare.  I appreciate TCTS assistance with evaluation of lung lesion.  Thompson Grayer MD, [place]

## 2018-10-19 NOTE — Progress Notes (Signed)
Pharmacy Antibiotic Note  Henry Mcpherson is a 63 y.o. male admitted on 10/10/2018 with MRSA bacteremia.  Pharmacy has been consulted for vancomycin dosing.  On current dose of vancomycin 1000 mg IV every 24 hours, AUC is calculated as 618.6. Renal function is stable with SCr 0.85 and estimated CrCl 100.7.  Plan: Vancomycin 750 mg IV every 24 hours. New calculated AUC is 463.9. Goal AUC 400-550. Continue to monitor renal function and vanc levels.  Height: 6\' 2"  (188 cm) Weight: 196 lb 13.9 oz (89.3 kg) IBW/kg (Calculated) : 82.2  Temp (24hrs), Avg:98.4 F (36.9 C), Min:98.1 F (36.7 C), Max:99 F (37.2 C)  Recent Labs  Lab 10/12/18 2344  10/13/18 2113 10/14/18 0716  10/16/18 0931 10/16/18 2020 10/17/18 0654 10/18/18 0629 10/19/18 0003 10/19/18 0717  WBC  --   --   --  11.6*  --  12.0* 13.3*  --  9.6  --   --   CREATININE  --    < > 0.92 0.69   < > 0.66 0.66 0.75 0.83  --  0.85  VANCOTROUGH  --   --  16  --   --   --   --   --   --   --   --   VANCOPEAK 25*  --   --   --   --   --   --   --   --   --   --   VANCORANDOM  --   --   --   --   --   --   --   --   --  37 28   < > = values in this interval not displayed.    Estimated Creatinine Clearance: 100.7 mL/min (by C-G formula based on SCr of 0.85 mg/dL).    Allergies  Allergen Reactions  . Warfarin And Related Itching and Rash    Thank you for allowing pharmacy to be a part of this patient's care.  Vallery Sa  PharmD Candidate 10/19/2018 8:56 AM

## 2018-10-19 NOTE — Progress Notes (Signed)
Spoke with Wyvonnia Lora, RN regarding patient's PICC order and discharge status.  Per [place] patient does not have discharge orders at this time.  Made aware that PICC will not be placed today due to large influx of PICC orders since patient is not discharging. Carolee Rota, RN VAST

## 2018-10-20 ENCOUNTER — Inpatient Hospital Stay (HOSPITAL_COMMUNITY): Payer: Medicare Other

## 2018-10-20 DIAGNOSIS — R7881 Bacteremia: Secondary | ICD-10-CM

## 2018-10-20 LAB — BASIC METABOLIC PANEL
Anion gap: 8 (ref 5–15)
BUN: 15 mg/dL (ref 8–23)
CO2: 31 mmol/L (ref 22–32)
Calcium: 8.9 mg/dL (ref 8.9–10.3)
Chloride: 98 mmol/L (ref 98–111)
Creatinine, Ser: 0.78 mg/dL (ref 0.61–1.24)
GFR calc Af Amer: >60 mL/min (ref >60)
GFR calc non Af Amer: >60 mL/min (ref >60)
Glucose, Bld: 178 mg/dL — ABNORMAL HIGH (ref 70–99)
Potassium: 3.6 mmol/L (ref 3.5–5.1)
Sodium: 137 mmol/L (ref 135–145)

## 2018-10-20 LAB — TYPE AND SCREEN
ABO/RH(D): O POS
Antibody Screen: NEGATIVE
Unit division: 0
Unit division: 0
Unit division: 0
Unit division: 0

## 2018-10-20 LAB — BPAM RBC
Blood Product Expiration Date: 202007142359
Blood Product Expiration Date: 202007142359
Blood Product Expiration Date: 202007162359
Blood Product Expiration Date: 202007162359
ISSUE DATE / TIME: 202006112020
ISSUE DATE / TIME: 202006151731
ISSUE DATE / TIME: 202006170426
Unit Type and Rh: 5100
Unit Type and Rh: 5100
Unit Type and Rh: 5100
Unit Type and Rh: 5100

## 2018-10-20 LAB — GLUCOSE, CAPILLARY
Glucose-Capillary: 148 mg/dL — ABNORMAL HIGH (ref 70–99)
Glucose-Capillary: 145 mg/dL — ABNORMAL HIGH (ref 70–99)
Glucose-Capillary: 105 mg/dL — ABNORMAL HIGH (ref 70–99)
Glucose-Capillary: 215 mg/dL — ABNORMAL HIGH (ref 70–99)

## 2018-10-20 LAB — MAGNESIUM: Magnesium: 1.8 mg/dL (ref 1.7–2.4)

## 2018-10-20 LAB — CBC
HCT: 27.7 % — ABNORMAL LOW (ref 39.0–52.0)
Hemoglobin: 8.3 g/dL — ABNORMAL LOW (ref 13.0–17.0)
MCH: 26.3 pg (ref 26.0–34.0)
MCHC: 30 g/dL (ref 30.0–36.0)
MCV: 87.7 fL (ref 80.0–100.0)
Platelets: 328 10*3/uL (ref 150–400)
RBC: 3.16 MIL/uL — ABNORMAL LOW (ref 4.22–5.81)
RDW: 18.9 % — ABNORMAL HIGH (ref 11.5–15.5)
WBC: 9.5 10*3/uL (ref 4.0–10.5)
nRBC: 0 % (ref 0.0–0.2)

## 2018-10-20 LAB — PROTIME-INR
INR: 1.2 (ref 0.8–1.2)
Prothrombin Time: 14.8 seconds (ref 11.4–15.2)

## 2018-10-20 MED ORDER — JUVEN PO PACK
1.0000 | PACK | Freq: Two times a day (BID) | ORAL | Status: DC
  Administered 2018-10-21 – 2018-10-24 (×6): 1 via ORAL
  Filled 2018-10-20 (×11): qty 1

## 2018-10-20 MED ORDER — ENSURE ENLIVE PO LIQD
237.0000 mL | Freq: Two times a day (BID) | ORAL | Status: DC
  Administered 2018-10-21 – 2018-10-25 (×7): 237 mL via ORAL

## 2018-10-20 MED ORDER — MIDAZOLAM HCL 2 MG/2ML IJ SOLN
INTRAMUSCULAR | Status: AC
  Filled 2018-10-20: qty 2

## 2018-10-20 MED ORDER — FENTANYL CITRATE (PF) 100 MCG/2ML IJ SOLN
INTRAMUSCULAR | Status: AC
  Filled 2018-10-20: qty 2

## 2018-10-20 MED ORDER — SODIUM CHLORIDE 0.9% FLUSH
10.0000 mL | INTRAVENOUS | Status: DC | PRN
  Administered 2018-10-24 – 2018-10-25 (×2): 10 mL
  Filled 2018-10-20 (×2): qty 40

## 2018-10-20 MED ORDER — GENERIC EXTERNAL MEDICATION
Status: DC
Start: ? — End: 2018-10-20

## 2018-10-20 MED ORDER — SODIUM CHLORIDE 0.9% FLUSH: 10.0000 mL | Freq: Two times a day (BID) | INTRAVENOUS | Status: DC

## 2018-10-20 MED ORDER — FENTANYL CITRATE (PF) 100 MCG/2ML IJ SOLN
INTRAMUSCULAR | Status: AC | PRN
  Administered 2018-10-20: 50 ug via INTRAVENOUS

## 2018-10-20 MED ORDER — LIDOCAINE HCL 1 % IJ SOLN
INTRAMUSCULAR | Status: AC
  Filled 2018-10-20: qty 20

## 2018-10-20 NOTE — Progress Notes (Signed)
Initial Nutrition Assessment  DOCUMENTATION CODES:   Not applicable  INTERVENTION:   Ensure Enlive po BID, each supplement provides 350 kcal and 20 grams of protein Juven Fruit Punch BID, each serving provides 95kcal and 2.5g of protein (amino acids glutamine and arginine)  NUTRITION DIAGNOSIS:   Increased nutrient needs related to wound healing as evidenced by estimated needs.  GOAL:   Patient will meet greater than or equal to 90% of their needs  MONITOR:   PO intake, Supplement acceptance, Labs, I & O's, Skin, Weight trends  REASON FOR ASSESSMENT:   LOS(Wounds)    ASSESSMENT:   Patient with PMH significant for CHF, CAD, essential HTN, COPD, DM, PUD, lumbar stenosis s/p lumbar decompression/fusion, and s/p left great toe amputation. Presents this admission with MRSA bacteremia likely from left foot infection.   6/11- IR biopsy of thoracolumbar discitis 6/15- pacemaker extraction  RD working remotely.  Spoke with pt via phone. Denies having loss in appetite PTA. States he typically eats 3 meals daily that consist of B: PB waffle or cereal, L: deli sandwich, D: meat, vegetable, grain. Meal completions charts as 50-100% for pt's last two meals. He complains the hospital food has no seasoning. Discussed the importance of protein intake to promote wound healing. Pt has been requesting Ensure from nursing BID. RD to order. Pt willing to try Juven as well.   Pt endorses a UBW that fluctuates between 180-200 lb. Denies any unintentional weight loss PTA.  Records indicate pt weighed 208 lb on 3/4 (obtaied from RD note) and 190 lb this admission. Pt suspects weight loss is fluid related as he has maintained a well balanced diet.   Medications: 40 mg lasix BID Labs: CBG 122-190  Diet Order:   Diet Order            Diet NPO time specified Except for: Sips with Meds  Diet effective now              EDUCATION NEEDS:   Education needs have been addressed  Skin:  Skin  Assessment: Skin Integrity Issues: Skin Integrity Issues:: Stage II, Diabetic Ulcer, Unstageable, Incisions Stage II: multiple buttocks Unstageable: R heel Diabetic Ulcer: R foot, R 2nd toe, L great toe Incisions: L chest, R groin, back  Last BM:  6/18  Height:   Ht Readings from Last 1 Encounters:  10/10/18 6\' 2"  (1.88 m)    Weight:   Wt Readings from Last 1 Encounters:  10/19/18 84.4 kg    Ideal Body Weight:  86.4 kg  BMI:  Body mass index is 23.89 kg/m.  Estimated Nutritional Needs:   Kcal:  2300-2500 kcal  Protein:  115-130 grams  Fluid:  >/= 2.3 L/day   Mariana Single RD, LDN Clinical Nutrition Pager # - [phone]

## 2018-10-20 NOTE — Plan of Care (Signed)
  Problem: Pain Managment: Goal: General experience of comfort will improve Outcome: Progressing   

## 2018-10-20 NOTE — Progress Notes (Signed)
PROGRESS NOTE  Henry Mcpherson KHT:[id number] DOB: [birth year] DOA: 10/10/2018 PCP: System, Pcp Not In   LOS: 10 days   Brief narrative: 63 y.o.malewith medical history significant ofsCHFwith EF 45%, A. fib, tachybradycardia syndromes/p PM,COPD, DM type II, PUD, lumbar stenosis, and cauda equina syndrome,s/plumbar decompression and fusion by Dr.Cram on 3/4;s/p left great toe amputation due to infection, who is transferred from [facility] due to back pain,bilateral leg weakness, foot infection, MRSAbacteremia.  Patient had surgical resection of left foot by podiatry at OSH.  On day 11 of vancomycin on day of transfer.  Repeat blood culture reportedly negative.  He states that he continues to haveback pain, bilateral leg weakness, difficulty walking. He states that he has decreased sensation in both legs which he attributes to diabetic neuropathy.  Denies new bowel or bladder issues other than usual poor bowel control.  Reportedly had lumbar X-ray 3 weeks ago, and finding was concerning for possible infection as per Dr. Windy Carina review.  On arrival here, hemodynamically stable.  Hemoglobin 7.8 (about baseline).  Electrolytes not impressive.  COVID-19 negative.  Per admitting provider, Dr. Ellene Route from neurosurgery consulted on arrival.  CT lumbar spine showed superior migration of left L4 screw, loosening of left L3 screw, discitis at T12-L1 with regional osteomyelitis and possible discitis at L3-4 and L5-S1.  Patient had IR fluoroscopy guided biopsy of the thoracolumbar discitis on 10/12/2018.  Cultures were sent.  He also had TTE which was concerning for pacemaker lead vegetation versus thrombus.  Pacemaker extracted by cardiology on 6/15.   MRI of thoracic and lumbar spine on 6/16 -showed chronic T12-L1 osteomyelitis/discitis, concern for L3-L4 and L5-S1 discitis, severe spinal canal stenosis at T12-L1, residual moderate to severe bilateral neuroforaminal stenosis at  L4-L5 and L5-S1.  No abscess noted.  Subjective: Patient was seen and examined this morning.  Lying on bed.  Not in distress. no new symptoms. Chart reviewed.  Hemoglobin 8.3, low RBC count at 3.16 WBC count normal at 9.5 no fever All blood cultures done here negative Currently on rifampin and IV vancomycin Pain control with IV Dilaudid and oral Percocet.  Assessment/Plan:  Principal Problem:   MRSA bacteremia Active Problems:   Chronic obstructive lung disease (HCC)   Cauda equina syndrome (HCC)   Spinal stenosis of lumbar region   HTN (hypertension)   HLD (hyperlipidemia)   GERD (gastroesophageal reflux disease)   Chronic systolic CHF (congestive heart failure) (HCC)   Atrial fibrillation, chronic   CAD (coronary artery disease)   History of amputation of left great toe (HCC)   Anemia   Stool guaiac positive   Left foot infection-great toe   Diabetes mellitus type 2 with complications (HCC)   Pressure injury of skin   Cardiac device in situ   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 -Infectious disease following. -ESR and CRP 50/1.5 respectively. -Repeat blood culture at OSH reportedly negative  -Repeat blood culture on 10/11/2018 here negative so far. -Continue IV vancomycin-started at OSH 11 days prior to transfer -Rifampin added per ID on 6/10 -Plan for 6 to 8 weeks of IV antibiotics given discitis and osteomyelitis. -PICC line placement.  Possible pacemaker lead vegetation versus thrombus -TEE showed 2.1 x 0.4 cm lesion in right atrium attached to the pacemaker lead -Pacemaker extracted on 6/15.  No cardiopulmonary symptoms or significant event since extraction. -Cardiology/EP following  T12-L1, L3-L4 and L5-S1 discitis T12-L1 osteomyelitis History of cauda equina  syndrome T12-L1 severe spinal canal stenosis L4-L5 and L5-S1 moderate to severe bilateral neuroforaminal stenosis  -s/p oflumbar decompression and fusion by Dr.Cram on 3/4.  -CT and MRI as above -Status post IR fluoroscopy guided biopsy of the discitis on 10/12/2018 -Tissue culture including fungal cultures negative but this is after about 12 days after he started antibiotic. -Follow AFB culture.  AFB smear negative. -Plan for 6 to 8 weeks of IV antibiotic per ID -Neurosurgery planning further decompression after further discussion with ID and cardiology -Antibiotic as above -PRN Dilaudid, Percocet and Flexeril abdominal for pain. -Bowel regimen  Left foot infection-great toe: s/p ofamputation by podiatry at outside hospital.  -Antibiotic as above -Appreciate wound care input  Small loculated right pleural effusion with adjacent pleural thickening and probable atelectasis:  -Patient with history of empyema per wife.  Patient has bilateral symptoms. -CTS was consulted.  Recommended CT-guided aspiration/biopsy.  IR consulted.  Chronic diastolic CHF: TTE on 0/17 with EF of 55 to 60% (improved), moderate LVH, indeterminate DD, moderately reduced RVSP no vegetation.  On 07/05/2018 showed EF 45-50%.  No cardiopulmonary symptoms.  Edema improving.  Renal function stable -Increased p.o. Lasix to 40 mg twice daily on 6/16 (on 40 mg daily at home) -Daily weight, intake output and renal function -Monitor electrolytes closely and replenish  Permanent atrial Fibrillation: Currently rate controlled.  CHA2DS2-VASc Score-5 but not on AC due to GI bleed -Metoprolol and digoxin discontinued by cardiology -Pacemaker extracted on 6/15 due to agitation -Closely monitor electrolytes and replenish aggressively.  CAD (coronary artery disease): s/p of stent and CABG. No Cp. -Continue lipitor  Chronic obstructive lung disease (HCC):stable. -prn albuterol nebs  Controlled IDDM-2 with complications, diabetic foot infection: on  NovoLog and Levemir 20 units at home.  A1c 6.2.  CBG within appropriate range. -SSI  -Levemir on hold  Essential hypertension: Normotensive -IV Hydralazine prn -P.o. Lasix as above  Chronic blood loss anemia and anemia of chronic disease: FOBT positive.  Hb 8.6 (about baseline).  -protonix 40 bid -Monitor H&H  Stage II pressure injuries over his left buttock: POA and stable. -Appreciate wound care input.  Mobility: PT eval Diet: N.p.o. for now for procedure. DVT prophylaxis: SCD given chronic GI bleed/FOBT positive Code Status:   Code Status: Full Code  Family Communication:  Expected Discharge: continue inpatient management  Consultants:   Neurosurgery  Infectious disease  EP/cardiology  CTS  IR  Procedures:   IR fluoroscopy guided biopsy of thoracolumbar discitis on 6/11  TEE on 6/12  Pacemaker extraction on 6/15.  Microbiology:  Initial blood culture at outside hospital reportedly MRSA  Repeat blood cultures outside hospital reportedly negative  Blood culture on 10/10/2018-negative so far  MRSA PCR negative  COVID-19 negative  Tissue cultures negative so far  Tissue fungal cultures negative so far  Tissue AFB culture pending  Antimicrobials:  Anti-infectives (From admission, onward)   Start     Dose/Rate Route Frequency Ordered Stop   10/19/18 2100  vancomycin (VANCOCIN) IVPB 750 mg/150 ml premix     750 mg 150 mL/hr over 60 Minutes Intravenous Every 24 hours 10/19/18 0906     10/17/18 1100  rifampin (RIFADIN) capsule 300 mg     300 mg Oral Every 12 hours 10/17/18 1043     10/17/18 0000  ceFAZolin (ANCEF) IVPB 1 g/50 mL premix     1 g 100 mL/hr over 30 Minutes Intravenous Every 6 hours 10/16/18 2108 10/17/18 1324   10/16/18 1315  gentamicin (GARAMYCIN) 80  mg in sodium chloride 0.9 % 500 mL irrigation  Status:  Discontinued     80 mg Irrigation To [facility] 10/16/18 0832 10/16/18 2059   10/16/18 1315  ceFAZolin (ANCEF) IVPB 2g/100 mL premix     2 g 200 mL/hr over 30 Minutes Intravenous To [facility]Stay Surgical  10/16/18 0832 10/16/18 1840   10/11/18 1400  rifampin (RIFADIN) capsule 300 mg  Status:  Discontinued     300 mg Oral Every 8 hours 10/11/18 1100 10/17/18 1043   10/10/18 2130  vancomycin (VANCOCIN) IVPB 1000 mg/200 mL premix  Status:  Discontinued     1,000 mg 200 mL/hr over 60 Minutes Intravenous Every 24 hours 10/10/18 2126 10/19/18 0906      Infusions:  . sodium chloride Stopped (10/17/18 0617)  . vancomycin 750 mg (10/19/18 2122)    Scheduled Meds: . acidophilus  1 capsule Oral Daily  . atorvastatin  40 mg Oral Daily  . enoxaparin (LOVENOX) injection  40 mg Subcutaneous Q24H  . furosemide  40 mg Oral BID  . insulin aspart  0-9 Units Subcutaneous TID WC  . nicotine  14 mg Transdermal Daily  . pantoprazole  40 mg Oral BID  . rifampin  300 mg Oral Q12H  . sodium chloride flush  3 mL Intravenous Q12H    PRN meds: sodium chloride, acetaminophen, albuterol, cyclobenzaprine, hydrALAZINE, HYDROmorphone (DILAUDID) injection, ondansetron (ZOFRAN) IV, oxyCODONE-acetaminophen   Objective: Vitals:   10/20/18 0443 10/20/18 0546  BP:  116/67  Pulse: (!) 104 61  Resp:  18  Temp:  98 F (36.7 C)  SpO2: 95% 95%    Intake/Output Summary (Last 24 hours) at 10/20/2018 0821 Last data filed at 10/20/2018 0600 Gross per 24 hour  Intake 444 ml  Output 1600 ml  Net -1156 ml   Filed Weights   10/16/18 0712 10/18/18 0439 10/19/18 2012  Weight: 86.4 kg 89.3 kg 84.4 kg   Weight change:  Body mass index is 23.89 kg/m.   Physical Exam: General exam: Appears calm and comfortable.  Lying down in bed.  Not in distress Skin: No rashes, lesions or ulcers. HEENT: Atraumatic, normocephalic, supple neck, no obvious bleeding Lungs: Clear to auscultation  CVS: regular rate and rhythm, pacemaker removal site with no evidence of infection. GI/Abd soft, nontender, nondistended, bowel sounds present CNS: Alert, awake, oriented x3 Psychiatry: Mood appropriate Extremities: Bilateral trace to 1+  pitting edema with chronic stasis changes, left lower extremity on bandage . Data Review: I have personally reviewed the laboratory data and studies available.  Recent Labs  Lab 10/14/18 0716 10/16/18 0931 10/16/18 2020 10/18/18 0629 10/20/18 0531  WBC 11.6* 12.0* 13.3* 9.6 9.5  HGB 8.0* 9.3* 9.0* 8.6* 8.3*  HCT 26.3* 31.7* 30.5* 29.1* 27.7*  MCV 85.9 87.3 88.4 87.4 87.7  PLT 350 394 369 340 328   Recent Labs  Lab 10/16/18 0931 10/16/18 2020 10/17/18 0654 10/18/18 0629 10/19/18 0717 10/20/18 0531  NA 138  --  137 137 138 137  K 3.5  --  3.7 3.6 3.7 3.6  CL 104  --  104 102 100 98  CO2 27  --  25 26 27 31   GLUCOSE 179*  --  177* 185* 132* 178*  BUN 13  --  13 14 12 15   CREATININE 0.66 0.66 0.75 0.83 0.85 0.78  CALCIUM 8.9  --  8.8* 8.9 9.0 8.9  MG 1.7  --  1.9 1.6* 1.7 1.8    Terrilee Croak, MD  [facility]  10/20/2018

## 2018-10-20 NOTE — Procedures (Signed)
Interventional Radiology Procedure:   Indications: Loculated right pleural fluid, concerning for empyema  Procedure: CT guided aspiration and drain placement  Findings: Thick brown fluid aspirated, suggestive for empyema.  Placed 12 Fr drain and removed 30 ml of fluid.    Complications: None     EBL: less than 10 ml  Plan: Sent fluid for culture.  Attached to PleurEvac but anticipate minimal output.     Sasan Wilkie R. Anselm Pancoast, MD  Pager: [phone]

## 2018-10-20 NOTE — TOC Progression Note (Addendum)
Transition of Care [facility]) - Progression Note    Patient Details  Name: Henry Mcpherson MRN: [id number] Date of Birth: Jan 15, 1956  [facility] CM/SW Contact  Henry Crews, RN Phone Number: [phone] 10/20/2018, 11:18 AM  Clinical Narrative:    Received vm from spouse - Henry Mcpherson - from yesterday. Spoke with Henry Mcpherson via telephone this morning. She stated that patient wants to return home instead of going to rehab while waiting for being ready for surgery. Henry Mcpherson has made private arrangements to assist with patient care at home.   Patient is currently active with [name]  [phone]. Spoke with Henry Mcpherson at [place] to advise of [place] needs for RN, PT. Unsure of when patient will be medically ready for discharge. Henry Mcpherson requested to be advised of discharge and will need HH order for PT and RN. CM to fax orders to [facility] at [phone]. [name] also needs H&P, D/C summary, and most recent MD notes.   Henry Mcpherson advised of [facility] providing IV antibiotics. She is agreeable to this. [name] informed.   Discussed transportation home. Henry Mcpherson prefers to use [facility] in [place] d/t more cost effective. Spoke with Henry Mcpherson at [facility] [phone] who advised that they will bill his insurance and it will most likely be covered.   Noted [employer] as payor. Henry Mcpherson advised that since patient recently turned 49, he now has Medicare with a [employer] supplement.   No DME needs - patient has hospital bed, wheelchair, "potty chair," and multiple cushions.   No issues with getting medications. Uses [facility].   PCP is Dr. Darnell Level at [facility].   Henry Mcpherson, M.D. Internal Medicine [place] [phone]  CM to continue to follow for transition of care needs.   Expected Discharge Plan: [place] Barriers to Discharge: Continued Medical Work  up  Expected Discharge Plan and Services Expected Discharge Plan: [place] In-house Referral: NA Discharge Planning Services: CM Consult Post Acute Care Choice: [place] arrangements for the past 2 months: Single Family Home                 DME Arranged: N/A DME Agency: NA       HH Arranged: RN, PT    HH Agency: [place]  Infusion: [facility]     Social Determinants of Health (SDOH) Interventions    Readmission Risk Interventions No flowsheet data found.

## 2018-10-20 NOTE — Sedation Documentation (Signed)
Due to patient heart rhythm and hypotension, no sedation to be given for this procedure. MD aware.

## 2018-10-20 NOTE — Consult Note (Signed)
Chief Complaint: Patient was seen in consultation today for right chest wall fluid collection/aspiration and possible drain placement.  Referring Physician(s): Grace Isaac  Supervising Physician: Markus Daft  Patient Status: [facility] - In-[facility]  History of Present Illness: Henry Mcpherson is a 63 y.o. male with a past medical history of hypertension, tachycardia-bradycardia syndrome, RBBB, atrial fibrillation, CAD, HF, COPD, diabetes mellitus, sleep apnea, osteomyelitis, and DDD. He was recently admitted to [facility] for the past 11 days for management of left great toe diabetic infection s/p amputation. He was transferred to [facility] 10/10/2018 for admission and management of back pain, bilateral leg weakness, foot infection, and MRSA bacteremia. During admission, he was found to have endocarditis and had his permanent pacemaker extracted. In addition, was found to have a right chest wall fluid collection. TCTS was consulted who recommended IR consultation for possible aspiration and drain placement.  CT chest 10/19/2018: 1. There is a small right pleural effusion, which appears loculated with adjacent pleural thickening and probable round atelectasis (series 4, image 95). Small, simple appearing left pleural effusion. 2. Coronary artery disease and aortic atherosclerosis.  CXR this AM: 1. Right PICC tip in the inferior aspect of the superior vena cava, 1 cm above the superior cavoatrial junction. 2. Mild patchy atelectasis or pneumonia at the right lung base with improvement. 3. Stable mild cardiomegaly.  IR consulted by Dr. Servando Snare for possible image-guided right chest wall fluid collection aspiration/possible drain placement. Patient awake and alert laying in bed with no complaints at this time. Denies fever, chills, chest pain, dyspnea, abdominal pain, or headache.   Past Medical History:  Diagnosis Date   CAD (coronary artery disease)    CHF (congestive heart  failure) (HCC)    COPD (chronic obstructive pulmonary disease) (HCC)    DDD (degenerative disc disease), cervical    Diabetes mellitus without complication (HCC)    Hypertension    Osteomyelitis ([place])    Permanent atrial fibrillation    RBBB    Sleep apnea    Tachycardia-bradycardia syndrome ([place])     Past Surgical History:  Procedure Laterality Date   BACK SURGERY     BUBBLE STUDY  10/13/2018   Procedure: BUBBLE STUDY;  Surgeon: Elouise Munroe, MD;  Location: [place];  Service: Cardiology;;   CORONARY ARTERY BYPASS GRAFT     [facility] at [facility] in [place] W/IMG [place]  10/12/2018   LAMINECTOMY     PACEMAKER GENERATOR CHANGE  10/30/2015   SJM Assurity VR by Dr Alden Server in [place]  05/01/2009   SJM PPM implanted by Dr Lysle Rubens for bradycardia in [place] N/A 10/16/2018   Procedure: PACEMAKER EXTRACTION;  Surgeon: Evans Lance, MD;  Location: [place];  Service: Cardiovascular;  Laterality: N/A;   TEE WITHOUT CARDIOVERSION N/A 10/13/2018   Procedure: TRANSESOPHAGEAL ECHOCARDIOGRAM (TEE);  Surgeon: Elouise Munroe, MD;  Location: [place];  Service: Cardiology;  Laterality: N/A;   TEE WITHOUT CARDIOVERSION N/A 10/16/2018   Procedure: TRANSESOPHAGEAL ECHOCARDIOGRAM (TEE);  Surgeon: Evans Lance, MD;  Location: [facility] OR;  Service: Cardiovascular;  Laterality: N/A;   TOE AMPUTATION      Allergies: Warfarin and related  Medications: Prior to Admission medications   Medication Sig Start Date End Date Taking? Authorizing Provider  acetaminophen (TYLENOL) 325 MG tablet Take 2 tablets (650 mg total) by mouth every 4 (four) hours as needed  for mild pain ((score 1 to 3) or temp > 100.5). 07/12/18  Yes Masoudi, Elhamalsadat, MD  albuterol (PROVENTIL HFA;VENTOLIN HFA) 108 (90 Base) MCG/ACT inhaler Inhale 1-2 puffs into the lungs every 6 (six) hours as needed for wheezing  or shortness of breath.   Yes [provider]  atorvastatin (LIPITOR) 40 MG tablet Take 40 mg by mouth daily.   Yes [provider]  [employer] VITAMIN B12 1000 MCG tablet Take 1,000 mcg by mouth daily. 09/05/18  Yes [provider]  cyclobenzaprine (FLEXERIL) 10 MG tablet Take 1 tablet (10 mg total) by mouth 2 (two) times daily as needed for muscle spasms. 07/12/18  Yes Masoudi, Elhamalsadat, MD  digoxin (LANOXIN) 0.125 MG tablet Take 0.125 mg by mouth daily. 05/04/18  Yes [provider]  furosemide (LASIX) 40 MG tablet Take 40 mg by mouth daily.   Yes [provider]  LEVEMIR FLEXTOUCH 100 UNIT/ML Pen Inject 15-20 Units into the skin every evening. 09/05/18  Yes [provider]  metolazone (ZAROXOLYN) 2.5 MG tablet Take 2.5 mg by mouth daily as needed for fluid. 07/05/18  Yes [provider]  metoprolol succinate (TOPROL-XL) 100 MG 24 hr tablet Take 100 mg by mouth 2 (two) times daily. 06/16/18  Yes [provider]  nicotine (NICODERM CQ - DOSED IN MG/24 HOURS) 14 mg/24hr patch Place 1 patch (14 mg total) onto the skin daily. 07/13/18  Yes Masoudi, Elhamalsadat, MD  pantoprazole (PROTONIX) 40 MG tablet Take 40 mg by mouth 2 (two) times daily. 05/26/18  Yes [provider]  Probiotic Product (PROBIOTIC DAILY PO) Take 1 capsule by mouth daily.   Yes [provider]  senna-docusate (SENOKOT-S) 8.6-50 MG tablet Take 1 tablet by mouth 2 (two) times daily as needed for mild constipation. 07/12/18  Yes Masoudi, Elhamalsadat, MD  sucralfate (CARAFATE) 1 g tablet Take 1 g by mouth 4 (four) times daily. 09/05/18  Yes [provider]  traMADol (ULTRAM) 50 MG tablet Take 50 mg by mouth every 6 (six) hours as needed for pain. 09/14/18  Yes [provider]  traZODone (DESYREL) 50 MG tablet Take 50 mg by mouth at bedtime as needed for sleep. 10/06/18  Yes [provider]     History reviewed. No pertinent family  history.  Social History   Socioeconomic History   Marital status: Married    Spouse name: Not on file   Number of children: Not on file   Years of education: Not on file   Highest education level: Not on file  Occupational History   Not on file  Social Needs   Financial resource strain: Not on file   Food insecurity    Worry: Not on file    Inability: Not on file   Transportation needs    Medical: Not on file    Non-medical: Not on file  Tobacco Use   Smoking status: Current Some Day Smoker    Types: Cigarettes   Smokeless tobacco: Never Used  Substance and Sexual Activity   Alcohol use: Not Currently   Drug use: Not Currently   Sexual activity: Not Currently  Lifestyle   Physical activity    Days per week: Not on file    Minutes per session: Not on file   Stress: Not on file  Relationships   Social connections    Talks on phone: Not on file    Gets together: Not on file    Attends religious service: Not on file  Active member of club or organization: Not on file    Attends meetings of clubs or organizations: Not on file    Relationship status: Not on file  Other Topics Concern   Not on file  Social History Narrative   Lives with spouse in [place]   2 grown children   Employed by [employer]      Review of Systems: A 12 point ROS discussed and pertinent positives are indicated in the HPI above.  All other systems are negative.  Review of Systems  Constitutional: Negative for chills and fever.  Respiratory: Negative for shortness of breath and wheezing.   Cardiovascular: Negative for chest pain and palpitations.  Gastrointestinal: Negative for abdominal pain.  Neurological: Negative for headaches.  Psychiatric/Behavioral: Negative for behavioral problems and confusion.    Vital Signs: BP (!) 137/92 (BP Location: Left Arm)    Pulse 96    Temp 97.6 F (36.4 C) (Oral)    Resp 18    Ht _0  (1.88 m)    Wt 186 lb 1.1 oz (84.4 kg)     SpO2 96%    BMI 23.89 kg/m   Physical Exam Vitals signs and nursing note reviewed.  Constitutional:      General: He is not in acute distress.    Appearance: Normal appearance.  Cardiovascular:     Comments: Irregular rate, irregular rhythm. Pulmonary:     Effort: Pulmonary effort is normal. No respiratory distress.     Breath sounds: Normal breath sounds. No wheezing.  Skin:    General: Skin is warm and dry.  Neurological:     Mental Status: He is alert and oriented to person, place, and time.  Psychiatric:        Mood and Affect: Mood normal.        Behavior: Behavior normal.        Thought Content: Thought content normal.        Judgment: Judgment normal.      MD Evaluation Airway: WNL Heart: WNL Abdomen: WNL Chest/ Lungs: WNL ASA  Classification: 3 Mallampati/Airway Score: One   Imaging: Dg Chest 2 View  Result Date: 10/17/2018 CLINICAL DATA:  Pacemaker removal. EXAM: CHEST - 2 VIEW COMPARISON:  Two-view chest x-ray 07/12/2018 FINDINGS: Patient is status post median sternotomy for CABG. Left-sided pacemaker and wires have been removed. Asymmetric right lower lobe airspace disease is present. Small right pleural effusion is suspected. Lung volumes are low. Degenerative changes are noted at the right shoulder. IMPRESSION: 1. Interval removal of pacemaker and leads. 2. Asymmetric right lower lobe airspace disease and effusion concerning for infection. Electronically Signed   By: San Morelle M.D.   On: 10/17/2018 08:54   Ct Chest Wo Contrast  Result Date: 10/19/2018 CLINICAL DATA:  Evaluate airspace disease EXAM: CT CHEST WITHOUT CONTRAST TECHNIQUE: Multidetector CT imaging of the chest was performed following the standard protocol without IV contrast. COMPARISON:  Chest radiograph, 10/17/2018 FINDINGS: Cardiovascular: Aortic atherosclerosis. Cardiomegaly. Three-vessel coronary artery calcifications and/or stents. No pericardial effusion. Mediastinum/Nodes: No  enlarged mediastinal, hilar, or axillary lymph nodes. Thyroid gland, trachea, and esophagus demonstrate no significant findings. Lungs/Pleura: There is a small right pleural effusion, which appears loculated with adjacent pleural thickening and probable round atelectasis (series 4, image 95). Small, simple appearing left pleural effusion. Upper Abdomen: No acute abnormality. Musculoskeletal: No chest wall mass or suspicious bone lesions identified. Severe, destructive arthrosis of the right glenoid. IMPRESSION: 1. There is a small right pleural effusion, which  appears loculated with adjacent pleural thickening and probable round atelectasis (series 4, image 95). Small, simple appearing left pleural effusion. 2.  Coronary artery disease and aortic atherosclerosis. Electronically Signed   By: Eddie Candle M.D.   On: 10/19/2018 13:07   Ct Lumbar Spine W Contrast  Result Date: 10/11/2018 CLINICAL DATA:  MRSA bacteremia. Recent lumbar fusion. Elevated white count. EXAM: CT LUMBAR SPINE WITH CONTRAST TECHNIQUE: Multidetector CT imaging of the lumbar spine was performed with intravenous contrast administration. CONTRAST:  100m OMNIPAQUE IOHEXOL 300 MG/ML  SOLN COMPARISON:  Outside CT myelogram 06/26/2018. Outside CT postoperative 09/07/2018. FINDINGS: Segmentation: Standard. Alignment: Degenerative scoliosis convex RIGHT apex L3. Trace degenerative anterolisthesis L4-5. Adequate posterior decompression L2 through L5. Vertebrae: Sclerosis L3 through S1. Endplate irregularity above and below L3-4 and L5-S1 is indeterminate. Endplate destruction above and below T12-L1, concerning for infection. Paraspinal and other soft tissues: No concerning postoperative fluid collections. Aortic atherosclerosis. No hydronephrosis. Disc levels: T12-L1: Widening of the disc space, with endplate irregularity, concerning for discitis and regional osteomyelitis. Further endplate destruction compared with May 2020 lumbar CT. Osseous spurring  greater to the RIGHT, results in subarticular zone and foraminal zone narrowing which could affect the T12 and L1 nerve roots. No visible epidural enhancement. L1-L2: Disc space narrowing with osseous spurring. Vacuum phenomenon. Posterior osseous spurring. Mild stenosis. LEFT greater than RIGHT L1 and L2 neural impingement are possible. L2-L3: Spontaneous arthrodesis across and interspace with complete loss of disc height. No residual impingement post lumbar decompression. L3-L4: Endplate reactive changes, with widening of the interspace, mildly progressed from priors, could represent discitis. Loosening of the LEFT L3 screw. LEFT L4 screw extends into the L3-4 interspace. Adequate posterior decompression. No definite residual foraminal or subarticular zone narrowing. L4-L5: Severe to disc space narrowing similar to priors. Posterior osseous spurring. Hardware intact. No RIGHT L4 screw. Adequate posterior decompression but foraminal narrowing on the LEFT could affect the L4 nerve root. L5-S1: Mild widening of the disc space similar to priors. Posterior osseous spurring. Hardware intact. IMPRESSION: 1. Status post L2 through L5 posterior decompression. Superior migration of the LEFT L4 screw. Loosening of the LEFT L3 screw. 2. Further endplate destruction with widening of the interspace at T12-L1, concerning for discitis and regional osteomyelitis. 3. Similar widening of the disc space with endplate irregularity at L3-4 and L5-S1 is not clearly progressed from priors, but indolent infection is not excluded. 4. It is unclear if the patient's pacemaker is MR compatible, but if so, MRI lumbar spine without and with contrast could provide additional information. Electronically Signed   By: JStaci RighterM.D.   On: 10/11/2018 08:53   Mr Thoracic Spine W Wo Contrast  Result Date: 10/17/2018 CLINICAL DATA:  Back pain.  T12-L1 osteomyelitis discitis. EXAM: MRI THORACIC AND LUMBAR SPINE WITHOUT AND WITH CONTRAST  TECHNIQUE: Multiplanar and multiecho pulse sequences of the thoracic and lumbar spine were obtained without and with intravenous contrast. CONTRAST:  9 mm Gadavist intravenous contrast. COMPARISON:  Chest x-ray from same day. CT lumbar spine dated October 11, 2018. FINDINGS: MRI THORACIC SPINE FINDINGS Alignment:  Physiologic. Vertebrae: No fracture, evidence of discitis, or bone lesion. T10 hemangioma. Cord:  Normal signal.  No intradural enhancement. Paraspinal and other soft tissues: Thick-walled 3.0 x 5.8 cm rim enhancing fluid collection in the right posterior pleural space, concerning for empyema. Adjacent consolidation in the right lower lobe. Prominent right greater than left paravertebral fat extending from T6-T11 with smooth, lobulated margins, potentially reflecting burnt out extramedullary hematopoiesis.  Disc levels: T1-T2: Tiny left paracentral disc protrusion.  No stenosis. T2-T3: Negative. T3-T4: Small left paracentral disc osteophyte complex with slight mass effect on the left ventral cord. No stenosis. T4-T5: Negative. T5-T6: Tiny left paracentral disc protrusion.  No stenosis. T6-T7: Negative. T7-T8: Central disc extrusion with disc material migrating inferiorly posterior to the T8 vertebral body to T8-T9. Flattening of the ventral cord. No stenosis. T8-T9: Central disc extrusion with flattening of the ventral cord. Extruded disc material behind the T9 vertebral body. No stenosis. T9-T10: Negative. T10-T11: Small central disc protrusion.  No stenosis. T11-T12: Mild disc bulging eccentric to the left. Borderline mild spinal canal stenosis. Mild left neuroforaminal stenosis. MRI LUMBAR SPINE FINDINGS Segmentation:  Standard. Alignment: Unchanged dextroscoliosis. Unchanged trace anterolisthesis at L4-L5. Vertebrae: Fluid and rim enhancement within the T12-L1, L3-L4, and L5-S1 disc spaces. There is some associated endplate irregularity, most prominent at T12-L1. There is no significant vertebral body  edema or enhancement. Fluid within the L1-L2 and L4-L5 disc spaces without definite endplate irregularity or enhancement. No fracture or focal bone lesion. Conus medullaris: Extends to the L1 level and appears normal. No intradural enhancement. Paraspinal and other soft tissues: Left psoas muscle edema from L1-L3. No paravertebral or epidural abscess. Disc levels: T12-L1: Unchanged disc space widening with endplate irregularity and posterior endplate spurring resulting in severe spinal canal stenosis and moderate left greater than right neuroforaminal stenosis. L1-L2: Diffuse disc bulging with endplate spurring. Mild spinal canal and bilateral neuroforaminal stenosis. L2-L3: Chronic severe disc height loss with endplate spurring. Prior posterior decompression. Residual moderate left lateral recess stenosis. No spinal canal or neuroforaminal stenosis. L3-L4: Prior posterior decompression and fusion. Unchanged disc space widening with diffuse disc bulging and endplate spurring. Residual mild left lateral recess stenosis and moderate left neuroforaminal stenosis. L4-L5: Prior posterior decompression and fusion. Chronic circumferential disc osteophyte complex. Residual moderate to severe bilateral neuroforaminal stenosis. No spinal canal stenosis. L5-S1: Prior posterior decompression and fusion. Unchanged mild widening of the disc space and circumferential disc osteophyte complex. Residual severe right and moderate to severe left neuroforaminal stenosis. No spinal canal stenosis. IMPRESSION: Thoracic spine: 1. Thick walled 3.0 x 5.8 cm rim enhancing fluid collection in the right posterior pleural space, concerning for empyema. Adjacent right lower lobe consolidation concerning for pneumonia. Recommend dedicated chest CT with contrast for further evaluation. 2. Multilevel thoracic spondylosis as described above. Disc extrusions at T7-T8 and T8-T9 with extruded disc material posterior to the T8 and T9 vertebral bodies.  Mass effect on the ventral cord at T7-T8 and T8-T9. No high-grade stenosis. Lumbar spine: 1. Chronic T12-L1 osteomyelitis discitis. 2. Prominent fluid within the L3-L4 and L5-S1 disc spaces with rim enhancement and mild endplate irregularity, concerning for indolent discitis. 3. No paravertebral or epidural abscess. 4. Multilevel lumbar spondylosis as described above. Severe spinal canal stenosis at T12-L1. 5. Prior L2-S1 posterior decompression and L3-S1 posterior fusion. Residual moderate to severe bilateral neuroforaminal stenosis at L4-L5 and L5-S1. Electronically Signed   By: Titus Dubin M.D.   On: 10/17/2018 15:12   Mr Lumbar Spine W Wo Contrast  Result Date: 10/17/2018 CLINICAL DATA:  Back pain.  T12-L1 osteomyelitis discitis. EXAM: MRI THORACIC AND LUMBAR SPINE WITHOUT AND WITH CONTRAST TECHNIQUE: Multiplanar and multiecho pulse sequences of the thoracic and lumbar spine were obtained without and with intravenous contrast. CONTRAST:  9 mm Gadavist intravenous contrast. COMPARISON:  Chest x-ray from same day. CT lumbar spine dated October 11, 2018. FINDINGS: MRI THORACIC SPINE FINDINGS Alignment:  Physiologic. Vertebrae:  No fracture, evidence of discitis, or bone lesion. T10 hemangioma. Cord:  Normal signal.  No intradural enhancement. Paraspinal and other soft tissues: Thick-walled 3.0 x 5.8 cm rim enhancing fluid collection in the right posterior pleural space, concerning for empyema. Adjacent consolidation in the right lower lobe. Prominent right greater than left paravertebral fat extending from T6-T11 with smooth, lobulated margins, potentially reflecting burnt out extramedullary hematopoiesis. Disc levels: T1-T2: Tiny left paracentral disc protrusion.  No stenosis. T2-T3: Negative. T3-T4: Small left paracentral disc osteophyte complex with slight mass effect on the left ventral cord. No stenosis. T4-T5: Negative. T5-T6: Tiny left paracentral disc protrusion.  No stenosis. T6-T7: Negative. T7-T8:  Central disc extrusion with disc material migrating inferiorly posterior to the T8 vertebral body to T8-T9. Flattening of the ventral cord. No stenosis. T8-T9: Central disc extrusion with flattening of the ventral cord. Extruded disc material behind the T9 vertebral body. No stenosis. T9-T10: Negative. T10-T11: Small central disc protrusion.  No stenosis. T11-T12: Mild disc bulging eccentric to the left. Borderline mild spinal canal stenosis. Mild left neuroforaminal stenosis. MRI LUMBAR SPINE FINDINGS Segmentation:  Standard. Alignment: Unchanged dextroscoliosis. Unchanged trace anterolisthesis at L4-L5. Vertebrae: Fluid and rim enhancement within the T12-L1, L3-L4, and L5-S1 disc spaces. There is some associated endplate irregularity, most prominent at T12-L1. There is no significant vertebral body edema or enhancement. Fluid within the L1-L2 and L4-L5 disc spaces without definite endplate irregularity or enhancement. No fracture or focal bone lesion. Conus medullaris: Extends to the L1 level and appears normal. No intradural enhancement. Paraspinal and other soft tissues: Left psoas muscle edema from L1-L3. No paravertebral or epidural abscess. Disc levels: T12-L1: Unchanged disc space widening with endplate irregularity and posterior endplate spurring resulting in severe spinal canal stenosis and moderate left greater than right neuroforaminal stenosis. L1-L2: Diffuse disc bulging with endplate spurring. Mild spinal canal and bilateral neuroforaminal stenosis. L2-L3: Chronic severe disc height loss with endplate spurring. Prior posterior decompression. Residual moderate left lateral recess stenosis. No spinal canal or neuroforaminal stenosis. L3-L4: Prior posterior decompression and fusion. Unchanged disc space widening with diffuse disc bulging and endplate spurring. Residual mild left lateral recess stenosis and moderate left neuroforaminal stenosis. L4-L5: Prior posterior decompression and fusion. Chronic  circumferential disc osteophyte complex. Residual moderate to severe bilateral neuroforaminal stenosis. No spinal canal stenosis. L5-S1: Prior posterior decompression and fusion. Unchanged mild widening of the disc space and circumferential disc osteophyte complex. Residual severe right and moderate to severe left neuroforaminal stenosis. No spinal canal stenosis. IMPRESSION: Thoracic spine: 1. Thick walled 3.0 x 5.8 cm rim enhancing fluid collection in the right posterior pleural space, concerning for empyema. Adjacent right lower lobe consolidation concerning for pneumonia. Recommend dedicated chest CT with contrast for further evaluation. 2. Multilevel thoracic spondylosis as described above. Disc extrusions at T7-T8 and T8-T9 with extruded disc material posterior to the T8 and T9 vertebral bodies. Mass effect on the ventral cord at T7-T8 and T8-T9. No high-grade stenosis. Lumbar spine: 1. Chronic T12-L1 osteomyelitis discitis. 2. Prominent fluid within the L3-L4 and L5-S1 disc spaces with rim enhancement and mild endplate irregularity, concerning for indolent discitis. 3. No paravertebral or epidural abscess. 4. Multilevel lumbar spondylosis as described above. Severe spinal canal stenosis at T12-L1. 5. Prior L2-S1 posterior decompression and L3-S1 posterior fusion. Residual moderate to severe bilateral neuroforaminal stenosis at L4-L5 and L5-S1. Electronically Signed   By: Titus Dubin M.D.   On: 10/17/2018 15:12   Dg Chest Port 1 View  Result Date: 10/20/2018 CLINICAL  DATA:  Status post right PICC placement. EXAM: PORTABLE CHEST 1 VIEW COMPARISON:  10/17/2018 and chest CT dated 10/19/2018. FINDINGS: Again demonstrated is a poor inspiration with stable mild enlargement of the cardiac silhouette. Post CABG changes are again demonstrated. Interval right PICC with its tip in the inferior aspect of the superior vena cava, proximally 1 cm above the superior cavoatrial junction. Mild patchy density at the  right lung base with improvement. Diffuse osteopenia. IMPRESSION: 1. Right PICC tip in the inferior aspect of the superior vena cava, 1 cm above the superior cavoatrial junction. 2. Mild patchy atelectasis or pneumonia at the right lung base with improvement. 3. Stable mild cardiomegaly. Electronically Signed   By: Claudie Revering M.D.   On: 10/20/2018 10:14   [place] Ekg Site Rite  Result Date: 10/18/2018 If Site Rite image not attached, placement could not be confirmed due to current cardiac rhythm.  Ir Lumbar Disc Aspiration W/img Guide  Result Date: 10/16/2018 INDICATION: Discitis/osteomyelitis at T12-L1. EXAM: FLUORO GUIDED DISC ASPIRATION AT T12-L1 MEDICATIONS: The patient is currently admitted to the hospital and receiving intravenous antibiotics. The antibiotics were administered within an appropriate time frame prior to the initiation of the procedure. ANESTHESIA/SEDATION: Fentanyl 1 mcg IV; Versed 25 mg IV Moderate Sedation Time:  18 minutes The patient was continuously monitored during the procedure by the interventional radiology nurse under my direct supervision. COMPLICATIONS: None immediate. PROCEDURE: Informed written consent was obtained from the patient after a thorough discussion of the procedural risks, benefits and alternatives. All questions were addressed. Maximal Sterile Barrier Technique was utilized including caps, mask, sterile gowns, sterile gloves, sterile drape, hand hygiene and skin antiseptic. A timeout was performed prior to the initiation of the procedure. The patient was laid prone on the fluoroscopic table. The skin overlying the thoracolumbar region was then prepped and draped in the usual sterile fashion. The skin overlying the needle entry site over the right paramedian T12-L1 region was then infiltrated with 0.25% bupivacaine and carried to the underlying musculature. Thereafter using biplane intermittent fluoroscopy, a 21g Franseen was then advanced into the posterior 1/3 at  T12-L1. More distal advancement of the needle was met with significant resistance on account of the irregular bony structures adjacent to the disc space. Using a 20 mL syringe, approximately 1 cc of thick blood tinge aspirate was obtained and sent for microbiologic analysis. Hemostasis was achieved at the skin entry site. The patient tolerated the procedure well. IMPRESSION: Fluoroscopic guided disc aspiration at T12-L1 for discitis, osteomyelitis. Electronically Signed   By: Luanne Bras M.D.   On: 10/13/2018 10:15    Labs:  CBC: Recent Labs    10/16/18 0931 10/16/18 2020 10/18/18 0629 10/20/18 0531  WBC 12.0* 13.3* 9.6 9.5  HGB 9.3* 9.0* 8.6* 8.3*  HCT 31.7* 30.5* 29.1* 27.7*  PLT 394 369 340 328    COAGS: Recent Labs    06/29/18 1459 06/30/18 0316 10/11/18 0611  INR 1.0 1.1 1.2  APTT 32  --  35    BMP: Recent Labs    10/17/18 0654 10/18/18 0629 10/19/18 0717 10/20/18 0531  NA 137 137 138 137  K 3.7 3.6 3.7 3.6  CL 104 102 100 98  CO2 _0 GLUCOSE 177* 185* 132* 178*  BUN _1 CALCIUM 8.8* 8.9 9.0 8.9  CREATININE 0.75 0.83 0.85 0.78  GFRNONAA >60 >60 >60 >60  GFRAA >60 >60 >60 >60    LIVER FUNCTION TESTS: Recent Labs  07/11/18 2162 07/12/18 0616 07/13/18 0407 10/16/18 2020 10/19/18 0745  BILITOT 0.5 0.6 0.5  --  0.6  AST 70* 40 31  --  15  ALT 82* 63* 50*  --  7  ALKPHOS 94 90 91  --  99  PROT 4.5* 4.6* 4.9*  --  5.7*  ALBUMIN 1.7* 1.7* 1.7* 2.1* 2.2*     Assessment and Plan:  Right chest wall fluid collection. Plan for image-guided right chest wall fluid collection aspiration with possible drain placement today with Dr. Anselm Pancoast. Patient is NPO. Afebrile and WBCs WNL. Last dose Lovenox this AM- ok to proceed per Dr. Anselm Pancoast. INR and COVID pending.  Risks and benefits of thoracentesis were discussed with the patient including, but not limited to bleeding, infection, pneumothorax, and that fact that all the fluid may not be  removed during today's procedure. All of the patient's questions were answered, patient is agreeable to proceed. Consent signed and in chart.   Thank you for this interesting consult.  I greatly enjoyed meeting Henry Mcpherson and look forward to participating in their care.  A copy of this report was sent to the requesting provider on this date.  Electronically Signed: Earley Abide, PA-C 10/20/2018, 12:08 PM   I spent a total of 40 Minutes in face to face in clinical consultation, greater than 50% of which was counseling/coordinating care for right chest wall fluid collection/aspiration and possible drain placement.

## 2018-10-20 NOTE — Progress Notes (Signed)
[facility]    Date of Admission:  10/10/2018      ID: Henry Mcpherson is a 63 y.o. male with disseminated MRSA infection/HW spinal infection Principal Problem:   MRSA bacteremia Active Problems:   Chronic obstructive lung disease (HCC)   Cauda equina syndrome (HCC)   Spinal stenosis of lumbar region   HTN (hypertension)   HLD (hyperlipidemia)   GERD (gastroesophageal reflux disease)   Chronic systolic CHF (congestive heart failure) (HCC)   Atrial fibrillation, chronic   CAD (coronary artery disease)   History of amputation of left great toe (HCC)   Anemia   Stool guaiac positive   Left foot infection-great toe   Diabetes mellitus type 2 with complications (HCC)   Pressure injury of skin   Cardiac device in situ   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 Awaiting IR procedure for thoracentesis/pig tail catheter  Medications:   acidophilus  1 capsule Oral Daily   atorvastatin  40 mg Oral Daily   enoxaparin (LOVENOX) injection  40 mg Subcutaneous Q24H   feeding supplement (ENSURE ENLIVE)  237 mL Oral BID BM   furosemide  40 mg Oral BID   insulin aspart  0-9 Units Subcutaneous TID WC   nicotine  14 mg Transdermal Daily   nutrition supplement (JUVEN)  1 packet Oral BID BM   pantoprazole  40 mg Oral BID   rifampin  300 mg Oral Q12H   sodium chloride flush  10-40 mL Intracatheter Q12H   sodium chloride flush  3 mL Intravenous Q12H    Objective: Vital signs in last 24 hours: Temp:  [97.6 F (36.4 C)-98.4 F (36.9 C)] 98.4 F (36.9 C) (06/19 2005) Pulse Rate:  [40-123] 123 (06/19 2005) Resp:  [11-20] 18 (06/19 2005) BP: (88-150)/(51-92) 121/85 (06/19 2005) SpO2:  [95 %-100 %] 97 % (06/19 2005) Weight:  [16[83 kg] 83 kg (06/19 2005)  Physical Exam  Constitutional: He is oriented to person, place, and time. He appears well-developed and well-nourished. No distress.  HENT:    Mouth/Throat: Oropharynx is clear and moist. No oropharyngeal exudate.  Cardiovascular: Normal rate, regular rhythm and normal heart sounds. Exam reveals no gallop and no friction rub.  No murmur heard.  Pulmonary/Chest: Effort normal and breath sounds normal. No respiratory distress. He has no wheezes.  Abdominal: Soft. Bowel sounds are normal. He exhibits no distension. There is no tenderness.  Lymphadenopathy:  He has no cervical adenopathy.  Neurological: He is alert and oriented to person, place, and time.  Skin: Skin is warm and dry. No rash noted. No erythema.  Psychiatric: He has a normal mood and affect. His behavior is normal.    Lab Results Recent Labs    10/18/18 0629 10/19/18 0717 10/20/18 0531  WBC 9.6  --  9.5  HGB 8.6*  --  8.3*  HCT 29.1*  --  27.7*  NA 137 138 137  K 3.6 3.7 3.6  CL 102 100 98  CO2 26 27 31   BUN 14 12 15   CREATININE 0.83 0.85 0.78   Liver Panel Recent Labs    10/19/18 0745  PROT 5.7*  ALBUMIN 2.2*  AST 15  ALT 7  ALKPHOS 99  BILITOT 0.6  BILIDIR 0.1  IBILI 0.5   Lab Results  Component Value Date   ESRSEDRATE 50 (H) 10/12/2018    Microbiology: reviewed Studies/Results: Ct Chest Wo Contrast  Result Date: 10/19/2018  CLINICAL DATA:  Evaluate airspace disease EXAM: CT CHEST WITHOUT CONTRAST TECHNIQUE: Multidetector CT imaging of the chest was performed following the standard protocol without IV contrast. COMPARISON:  Chest radiograph, 10/17/2018 FINDINGS: Cardiovascular: Aortic atherosclerosis. Cardiomegaly. Three-vessel coronary artery calcifications and/or stents. No pericardial effusion. Mediastinum/Nodes: No enlarged mediastinal, hilar, or axillary lymph nodes. Thyroid gland, trachea, and esophagus demonstrate no significant findings. Lungs/Pleura: There is a small right pleural effusion, which appears loculated with adjacent pleural thickening and probable round atelectasis (series 4, image 95). Small, simple appearing left  pleural effusion. Upper Abdomen: No acute abnormality. Musculoskeletal: No chest wall mass or suspicious bone lesions identified. Severe, destructive arthrosis of the right glenoid. IMPRESSION: 1. There is a small right pleural effusion, which appears loculated with adjacent pleural thickening and probable round atelectasis (series 4, image 95). Small, simple appearing left pleural effusion. 2.  Coronary artery disease and aortic atherosclerosis. Electronically Signed   By: Eddie Candle M.D.   On: 10/19/2018 13:07   Dg Chest Port 1 View  Result Date: 10/20/2018 CLINICAL DATA:  Status post right PICC placement. EXAM: PORTABLE CHEST 1 VIEW COMPARISON:  10/17/2018 and chest CT dated 10/19/2018. FINDINGS: Again demonstrated is a poor inspiration with stable mild enlargement of the cardiac silhouette. Post CABG changes are again demonstrated. Interval right PICC with its tip in the inferior aspect of the superior vena cava, proximally 1 cm above the superior cavoatrial junction. Mild patchy density at the right lung base with improvement. Diffuse osteopenia. IMPRESSION: 1. Right PICC tip in the inferior aspect of the superior vena cava, 1 cm above the superior cavoatrial junction. 2. Mild patchy atelectasis or pneumonia at the right lung base with improvement. 3. Stable mild cardiomegaly. Electronically Signed   By: Claudie Revering M.D.   On: 10/20/2018 10:14   Ct Perc Pleural Drain W/indwell Cath W/img Guide  Result Date: 10/20/2018 INDICATION: 63 year old with right chest empyema. Plan for CT-guided aspiration or drain placement. EXAM: CT-GUIDED PLACEMENT OF RIGHT CHEST DRAIN MEDICATIONS: No antibiotics were given for the procedure. ANESTHESIA/SEDATION: Fentanyl 50 mcg The patient was continuously monitored during the procedure by the interventional radiology nurse under my direct supervision. COMPLICATIONS: None immediate. PROCEDURE: Informed written consent was obtained from the patient after a thorough  discussion of the procedural risks, benefits and alternatives. All questions were addressed. Maximal Sterile Barrier Technique was utilized including caps, mask, sterile gowns, sterile gloves, sterile drape, hand hygiene and skin antiseptic. A timeout was performed prior to the initiation of the procedure. Patient was placed on his right side. CT images through the chest were obtained. The complex fluid collection along the posterior right chest was targeted. The right side of the back was prepped with chlorhexidine and sterile field was created. Skin and soft tissues anesthetized with 1% lidocaine. Yueh catheter was directed into this collection with CT guidance. Thick brown fluid was aspirated. Due to the appearance of the fluid, decided the patient would need a percutaneous drain. Stiff Amplatz wire was advanced into the collection and the tract was dilated to accommodate a 12 [place] multipurpose drain. Approximately 30 mL of thick brown fluid was aspirated. Catheter was sutured to skin and attached to PleurEvac chest drainage system. Despite flushing the drain, there was minimal output after the initial 30 mL. Dressing was placed over the drain. FINDINGS: Thick-walled complex fluid collection along the posterior right chest. 30 mL of thick brown fluid was removed and findings are suggestive for a chest empyema. Minimal output from the drain after  the initial 30 mL. IMPRESSION: CT-guided drain placement within the posterior right chest empyema. Electronically Signed   By: Richarda Overlie M.D.   On: 10/20/2018 18:27   Hybrid Or Imaging (mc Only)  Result Date: 10/20/2018 There is no interpretation for this exam.  This order is for images obtained during a surgical procedure.  Please See "Surgeries" Tab for more information regarding the procedure.     Assessment/Plan: Disseminated mrsa hw infection = continue on vancomycin plus rifampin. Concern for loculated fluid, predisposition for becoming empyema. Discuss  with IR to have aspiration/+/- pigtail catheter placement. Please send specimen for cx. Anticipate to treat for extended period of time, and may need to restart abtx clock once repeat spinal surgery is done.  [facility] Cell: [phone] Pager: [phone]  10/20/2018, 10:13 PM

## 2018-10-20 NOTE — Progress Notes (Signed)
Peripherally Inserted Central Catheter/Midline Placement  The IV Nurse has discussed with the patient and/or persons authorized to consent for the patient, the purpose of this procedure and the potential benefits and risks involved with this procedure.  The benefits include less needle sticks, lab draws from the catheter, and the patient may be discharged home with the catheter. Risks include, but not limited to, infection, bleeding, blood clot (thrombus formation), and puncture of an artery; nerve damage and irregular heartbeat and possibility to perform a PICC exchange if needed/ordered by physician.  Alternatives to this procedure were also discussed.  Bard Power PICC patient education guide, fact sheet on infection prevention and patient information card has been provided to patient /or left at bedside.    PICC/Midline Placement Documentation  PICC Single Lumen 10/20/18 PICC Right Brachial 39 cm 0 cm (Active)  Indication for Insertion or Continuance of Line Home intravenous therapies (PICC only) 10/20/18 0915  Exposed Catheter (cm) 0 cm 10/20/18 0915  Site Assessment Clean;Dry;Intact 10/20/18 0915  Line Status Flushed;Saline locked;Blood return noted 10/20/18 0915  Dressing Type Transparent;Securing device 10/20/18 0915  Dressing Status Clean;Dry;Intact;Antimicrobial disc in place 10/20/18 0915  Dressing Intervention New dressing 10/20/18 0915  Dressing Change Due 10/27/18 10/20/18 0915       Enos Fling 10/20/2018, 9:27 AM

## 2018-10-20 NOTE — Progress Notes (Signed)
Patient ID: Henry Mcpherson, male   DOB: 03-10-1956, 63 y.o.   MRN: [id number] [name] seems to be doing well he had his empyema aspirated today he is got a drain tube/chest tube in place.  Neurologically remains unchanged.  Extensively discussed this case with cardiology yesterday and Dr. Rayann Heman and pacemaker is not a rate limiting step for his surgery.  I think at this point we identify if there was active infection in the pleural fluid give him a couple weeks of IV antibiotics and schedule surgery when we have seen drop in his sed rate and evidence the infection is under control.  I think it is okay for him to be discharged with home health and home IV antibiotics after his chest tube is removed.  And he and I will coordinate timing of his surgery as an outpatient.

## 2018-10-21 LAB — ACID FAST SMEAR (AFB, MYCOBACTERIA): Acid Fast Smear: NEGATIVE

## 2018-10-21 LAB — GLUCOSE, CAPILLARY
Glucose-Capillary: 150 mg/dL — ABNORMAL HIGH (ref 70–99)
Glucose-Capillary: 231 mg/dL — ABNORMAL HIGH (ref 70–99)
Glucose-Capillary: 194 mg/dL — ABNORMAL HIGH (ref 70–99)
Glucose-Capillary: 145 mg/dL — ABNORMAL HIGH (ref 70–99)

## 2018-10-21 MED ORDER — HYPROMELLOSE (GONIOSCOPIC) 2.5 % OP SOLN: 1.0000 [drp] | Freq: Three times a day (TID) | OPHTHALMIC | Status: DC | PRN

## 2018-10-21 MED ORDER — POLYVINYL ALCOHOL 1.4 % OP SOLN
1.0000 [drp] | Freq: Three times a day (TID) | OPHTHALMIC | Status: DC | PRN
  Filled 2018-10-21: qty 15

## 2018-10-21 NOTE — Progress Notes (Signed)
PROGRESS NOTE  Henry Mcpherson JSE:[id number] DOB: 1956-01-03 DOA: 10/10/2018 PCP: System, Pcp Not In   LOS: 11 days   Brief narrative: Patient is a 63 y.o.malewith medical history significant ofsCHFwith EF 45%, A. fib, tachybradycardia syndromes/p PM,COPD, DM type II, PUD, lumbar stenosis, and cauda equina syndrome,s/plumbar decompression and fusion by Dr.Cram on 3/4;s/p left great toe amputation due to infection, who is transferred from [facility] due to back pain,bilateral leg weakness, foot infection, MRSAbacteremia.  Patient had surgical resection of left foot by podiatry at OSH. On day 11 of vancomycin on day of transfer. Repeat blood culture reportedly negative.  He states that he continues to haveback pain, bilateral leg weakness, difficulty walking. He states that he has decreased sensation in both legs which he attributes to diabetic neuropathy. Denies new bowel or bladder issues other than usual poor bowel control. Reportedly had lumbar X-ray 3 weeks ago, and finding was concerning for possible infection as per Dr. Windy Carina review.  On arrival here, hemodynamically stable. Hemoglobin 7.8 (about baseline). Electrolytes not impressive. COVID-19 negative. Per admitting provider, Dr. Ellene Route from neurosurgery consulted on arrival. CT lumbar spine showed superior migration of left L4 screw, loosening of left L3 screw, discitis at T12-L1 with regional osteomyelitis and possible discitis at L3-4 and L5-S1.  Patient had IR fluoroscopy guided biopsy of the thoracolumbar discitis on 10/12/2018. Cultures were sent. He also had TTE which was concerning for pacemaker lead vegetation versus thrombus. Pacemaker extracted by cardiology on 6/15.   MRI of thoracic and lumbar spine on 6/16 -showed chronic T12-L1 osteomyelitis/discitis, concern for L3-L4 and L5-S1 discitis, severe spinal canal stenosis at T12-L1, residual moderate to severe bilateral neuroforaminal  stenosis at L4-L5 and L5-S1. No abscess noted.  Subjective: Patient was seen and examined this morning.  Pleasant elderly male.  Not in distress.  Complains of dryness in both eyes for last 2 days.  Noted to have swelling on both eyes without any redness or tenderness.  Assessment/Plan:  Principal Problem:   MRSA bacteremia Active Problems:   Chronic obstructive lung disease (HCC)   Cauda equina syndrome (HCC)   Spinal stenosis of lumbar region   HTN (hypertension)   HLD (hyperlipidemia)   GERD (gastroesophageal reflux disease)   Chronic systolic CHF (congestive heart failure) (HCC)   Atrial fibrillation, chronic   CAD (coronary artery disease)   History of amputation of left great toe (HCC)   Anemia   Stool guaiac positive   Left foot infection-great toe   Diabetes mellitus type 2 with complications (HCC)   Pressure injury of skin   Cardiac device in situ   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 -Infectious disease following. -ESR and CRP 50/1.5 respectively. -Repeat blood culture at OSH reportedly negative  -Repeat blood culture on 10/11/2018 here negative so far. -Continue IV vancomycin-started at OSH 11 days prior to transfer -Rifampin added per ID on 6/10 -Planned for 6 to 8 weeks of IV antibiotics given discitis and osteomyelitis. -PICC line in place.Marland Kitchen  Possible pacemaker lead vegetation versus thrombus -TEE showed 2.1 x 0.4 cm lesion in right atrium attached to the pacemaker lead -Pacemaker extracted on 6/15.No cardiopulmonary symptoms or significant event since extraction. -Cardiology/EP following  T12-L1, L3-L4 and L5-S1 discitis T12-L1 osteomyelitis History of cauda equina syndrome T12-L1 severe spinal canal stenosis L4-L5 and L5-S1 moderate to severe bilateral neuroforaminal stenosis -s/p oflumbar decompression and fusion by Dr.Cram on 3/4.  -CT and  MRI as above -Status  post IR fluoroscopy guided biopsy of the discitis on 10/12/2018 -Tissue culture including fungal cultures negative but this is after about 12 days after he started antibiotic. -Follow AFB culture. AFB smear negative. -Plan for 6 to 8 weeks of IV antibioticper ID -Neurosurgery planning further decompression after further discussion with ID and cardiology -Antibiotic as above -PRNDilaudid,Percocet and Flexeril abdominal for pain. -Bowel regimen  Left foot infection-great toe: s/p ofamputation by podiatry at outside hospital.  -Antibiotic as above -Appreciate wound care input  Small loculated right pleural effusion with adjacent pleural thickening and probable atelectasis: -Patient with history of empyema per wife.  -Underwent aspiration of 30 mL of thick brown fluid.  Pigtail catheter in place attached to chest tube.  Chronic diastolic CHF:TTE on 2/13 with EF of 55 to 60% (improved), moderate LVH, indeterminate DD, moderately reduced RVSP no vegetation. On 07/05/2018 showed EF 45-50%. No cardiopulmonary symptoms.Edema improving. Renal function stable -Increasedp.o. Lasix to 40 mg twice daily on 6/16 (on 40 mg daily at home) -Daily weight, intake output and renal function -Monitor electrolytes closely and replenish  Permanent atrial Fibrillation: Currently rate controlled. CHA2DS2-VASc Score-5 but not on ACdue to GI bleed -Metoprolol and digoxin discontinued by cardiology -Pacemaker extracted on 6/15 due to vegetation -Closely monitor electrolytes and replenish aggressively.  CAD (coronary artery disease): s/p of stent and CABG. No chest pain. -Continue lipitor  Chronic obstructive lung disease (HCC):stable. -prn albuterol nebs  Controlled IDDM-2 with complications, diabetic foot infection: on NovoLog and Levemir 20 units at home. A1c 6.2. CBG within appropriate range with sliding scale insulin only. -Levemir on hold  Essential hypertension: Normotensive -IV  Hydralazine prn -P.o. Lasix as above  Chronic blood loss anemia and anemia of chronic disease:FOBT positive. Hb 8.6 (about baseline).  -protonix 40 bid -Monitor H&H  Stage II pressure injuries over his left buttock: POAand stable. -Appreciate wound care input.  Dryness in both eyes -artificial tears ordered.  Seems to have swollen eyelids but otherwise not fluid overloaded.  Has shrunken skin in upper extremities.  Chronic bilateral pedal edema present.  Currently on Lasix 40 mg twice daily.  Continue to monitor.  Mobility: PT eval Diet: N.p.o. for now for procedure. DVT prophylaxis:SCD given chronic GI bleed/FOBT positive Code Status:  Code Status: Full Code  Family Communication:Patient states he has been updating his wife by himself Expected Discharge:continue inpatient management.  Needs chest tube out probably in the next 1 to 2 days.  Needs PT evaluation.  Consultants:  Neurosurgery  Infectious disease  EP/cardiology  CTS  IR  Procedures:  IR fluoroscopy guided biopsy of thoracolumbar discitis on 6/11  TEE on 6/12  Pacemaker extraction on 6/15.  Microbiology:  Initial blood culture at outside hospital reportedly MRSA  Repeat blood cultures outside hospital reportedly negative  Blood culture on 10/10/2018-negative so far  MRSA PCR negative  COVID-19 negative  Tissue cultures negative so far  Tissue fungal cultures negative so far  Tissue AFB culture pending   Antimicrobials:  Anti-infectives (From admission, onward)   Start     Dose/Rate Route Frequency Ordered Stop   10/19/18 2100  vancomycin (VANCOCIN) IVPB 750 mg/150 ml premix     750 mg 150 mL/hr over 60 Minutes Intravenous Every 24 hours 10/19/18 0906     10/17/18 1100  rifampin (RIFADIN) capsule 300 mg     300 mg Oral Every 12 hours 10/17/18 1043     10/17/18 0000  ceFAZolin (ANCEF) IVPB 1 g/50 mL premix  1 g 100 mL/hr over 30 Minutes Intravenous Every 6 hours 10/16/18  2108 10/17/18 1324   10/16/18 1315  gentamicin (GARAMYCIN) 80 mg in sodium chloride 0.9 % 500 mL irrigation  Status:  Discontinued     80 mg Irrigation To [facility] 10/16/18 0832 10/16/18 2059   10/16/18 1315  ceFAZolin (ANCEF) IVPB 2g/100 mL premix     2 g 200 mL/hr over 30 Minutes Intravenous To [facility] 10/16/18 0832 10/16/18 1840   10/11/18 1400  rifampin (RIFADIN) capsule 300 mg  Status:  Discontinued     300 mg Oral Every 8 hours 10/11/18 1100 10/17/18 1043   10/10/18 2130  vancomycin (VANCOCIN) IVPB 1000 mg/200 mL premix  Status:  Discontinued     1,000 mg 200 mL/hr over 60 Minutes Intravenous Every 24 hours 10/10/18 2126 10/19/18 0906      Infusions:  . sodium chloride Stopped (10/17/18 0617)  . vancomycin 750 mg (10/20/18 2141)    Scheduled Meds: . acidophilus  1 capsule Oral Daily  . atorvastatin  40 mg Oral Daily  . enoxaparin (LOVENOX) injection  40 mg Subcutaneous Q24H  . feeding supplement (ENSURE ENLIVE)  237 mL Oral BID BM  . furosemide  40 mg Oral BID  . insulin aspart  0-9 Units Subcutaneous TID WC  . nicotine  14 mg Transdermal Daily  . nutrition supplement (JUVEN)  1 packet Oral BID BM  . pantoprazole  40 mg Oral BID  . rifampin  300 mg Oral Q12H  . sodium chloride flush  10-40 mL Intracatheter Q12H  . sodium chloride flush  3 mL Intravenous Q12H    PRN meds: sodium chloride, acetaminophen, albuterol, cyclobenzaprine, hydrALAZINE, HYDROmorphone (DILAUDID) injection, ondansetron (ZOFRAN) IV, oxyCODONE-acetaminophen, sodium chloride flush   Objective: Vitals:   10/21/18 0505 10/21/18 0920  BP: 131/89 125/71  Pulse: 73 68  Resp: 19 18  Temp: 98.4 F (36.9 C) 98.5 F (36.9 C)  SpO2: 96% 94%    Intake/Output Summary (Last 24 hours) at 10/21/2018 1441 Last data filed at 10/21/2018 0900 Gross per 24 hour  Intake 1074 ml  Output 1645 ml  Net -571 ml   Filed Weights   10/18/18 0439 10/19/18 2012 10/20/18 2005  Weight: 89.3 kg 84.4  kg 83 kg   Weight change: -1.4 kg Body mass index is 23.49 kg/m.   Physical Exam: General exam: Appears calm and comfortable.  Skin: No rashes, lesions or ulcers. HEENT: Atraumatic, normocephalic, supple neck, no obvious bleeding, swelling noted on both eyelids, patient states it is new Lungs: Clear to auscultation bilaterally, right side with chest tube CVS: Regular rate and rhythm, no murmur GI/Abd soft, nontender, nondistended, bowel sound present CNS: Alert, awake, oriented x3 Psychiatry: Mood appropriate Extremities: Pedal edema 1+ bilateral lower extremities, chronic stasis changes, left foot with bandaged cellulitis  Data Review: I have personally reviewed the laboratory data and studies available.  Recent Labs  Lab 10/16/18 0931 10/16/18 2020 10/18/18 0629 10/20/18 0531  WBC 12.0* 13.3* 9.6 9.5  HGB 9.3* 9.0* 8.6* 8.3*  HCT 31.7* 30.5* 29.1* 27.7*  MCV 87.3 88.4 87.4 87.7  PLT 394 369 340 328   Recent Labs  Lab 10/16/18 0931 10/16/18 2020 10/17/18 0654 10/18/18 0629 10/19/18 0717 10/20/18 0531  NA 138  --  137 137 138 137  K 3.5  --  3.7 3.6 3.7 3.6  CL 104  --  104 102 100 98  CO2 27  --  25 26 27 31   GLUCOSE  179*  --  177* 185* 132* 178*  BUN 13  --  13 14 12 15   CREATININE 0.66 0.66 0.75 0.83 0.85 0.78  CALCIUM 8.9  --  8.8* 8.9 9.0 8.9  MG 1.7  --  1.9 1.6* 1.7 1.8    Terrilee Croak, MD  [facility] 10/21/2018

## 2018-10-21 NOTE — Progress Notes (Signed)
Changed dressing on left foot.  Patient tolerated well.

## 2018-10-21 NOTE — Progress Notes (Signed)
Rhythm remains stable.  He is in sinus with frequent atrial ectopy.  No prolonged pauses or brady events. No indication for pacing currently.  CT aspiration of pleural fluid planned. I think that this is an important step in management of his infectious problems.  I apprecaite TCTS and radiology assistance with this  Once bacteremia/ infections are adequately managed, hopefully he can proceed with definitive surgical treatment for his back.  I have spoken with Dr Saintclair Halsted earlier this week.  OK to proceed from a cardiology standpoint to back surgery.  A this point, I would like to avoid pacemaker reimplantation indefinitely.  He has had no significant bradycardias post removal of his device.  Ok to proceed with general anesthesia and any surgery required from a cardiology perspective.  Thompson Grayer MD, [place]

## 2018-10-21 NOTE — Plan of Care (Signed)
  Problem: Health Behavior/Discharge Planning: Goal: Ability to manage health-related needs will improve Outcome: Progressing   

## 2018-10-22 LAB — GLUCOSE, CAPILLARY
Glucose-Capillary: 173 mg/dL — ABNORMAL HIGH (ref 70–99)
Glucose-Capillary: 198 mg/dL — ABNORMAL HIGH (ref 70–99)
Glucose-Capillary: 198 mg/dL — ABNORMAL HIGH (ref 70–99)
Glucose-Capillary: 253 mg/dL — ABNORMAL HIGH (ref 70–99)

## 2018-10-22 MED ORDER — CIPROFLOXACIN HCL 0.3 % OP SOLN
2.0000 [drp] | OPHTHALMIC | Status: DC
  Administered 2018-10-22 – 2018-10-25 (×14): 2 [drp] via OPHTHALMIC
  Filled 2018-10-22: qty 2.5

## 2018-10-22 MED ORDER — HYDROMORPHONE HCL 1 MG/ML IJ SOLN
0.5000 mg | Freq: Three times a day (TID) | INTRAMUSCULAR | Status: DC | PRN
  Administered 2018-10-22 – 2018-10-25 (×8): 0.5 mg via INTRAVENOUS
  Filled 2018-10-22 (×9): qty 1

## 2018-10-22 MED ORDER — POLYVINYL ALCOHOL 1.4 % OP SOLN
1.0000 [drp] | Freq: Three times a day (TID) | OPHTHALMIC | Status: DC
  Administered 2018-10-22 – 2018-10-25 (×9): 1 [drp] via OPHTHALMIC
  Filled 2018-10-22: qty 15

## 2018-10-22 NOTE — Progress Notes (Signed)
Pharmacy Antibiotic Note  Henry Mcpherson is a 63 y.o. male admitted on 10/10/2018 with MRSA bacteremia.  Pharmacy consulted for vancomycin dosing.  Continues on IV Vancomycin 750 mg IV every 24 hours for disseminated MRSA infection/HW spinal infection. (Hx of fusion surgery in March 2020).   Vancomycin current dose: calculated AUC is 463. Goal AUC 400-550. Renal function is stable with SCr 0.78 and estimated CrCl 107 ml/ min.  UOP 1600 ml (0.8 ml/kg/hr) reoorded on 6/20.  S/p amputation of toe on 6/2.TEE w/ vege vs thrombus on lead. ID on board -PPM extraction 6/15, avoiding re-implant as long as possible.  S/p aspiration of locultated Rt pleural fluid & drain placed by IR 6/19 , culture sent growing Staph aureus , pending  Afeb, WBC WNL ID noted will need prolonged course of 6-8 weeks with reassessment  Vanc 6/9>> Rifampin po 6/10>>  AUC measured and Vanc dose adjustments:  6/12 AUC 489 6/18 AUC 618 >on 1g q24h > Lower dose to 750 mg every 24 hours   OSH: MRSA bacteremia 6/1 BCx from OSH: NGF 6/9 BCx: neg 6/11 Wound cx > neg 6/15 MRSA PCR neg 6/10 COVID neg 6/10 surgical PCR: neg 6/19 pleural empyema wound cx: staph aureus pending   Plan: Continue Vancomycin 750 mg IV every 24 hours. New calculated AUC is 463.9. Goal AUC 400-550. Continue to monitor renal function, cultures and vanc levels.  Height: 6\' 2"  (188 cm) Weight: 182 lb 15.7 oz (83 kg) IBW/kg (Calculated) : 82.2  Temp (24hrs), Avg:98.4 F (36.9 C), Min:98.4 F (36.9 C), Max:98.4 F (36.9 C)  Recent Labs  Lab 10/16/18 0931 10/16/18 2020 10/17/18 0654 10/18/18 0629 10/19/18 0003 10/19/18 0717 10/20/18 0531  WBC 12.0* 13.3*  --  9.6  --   --  9.5  CREATININE 0.66 0.66 0.75 0.83  --  0.85 0.78  VANCORANDOM  --   --   --   --  37 28  --     Estimated Creatinine Clearance: 107 mL/min (by C-G formula based on SCr of 0.78 mg/dL).    Allergies  Allergen Reactions  . Warfarin And Related Itching and Rash     Thank you for allowing pharmacy to be a part of this patient's care.  Nicole Cella, RPh Clinical Pharmacist [phone] Please check AMION for all [place] phone numbers After 10:00 PM, call [place] [phone] 10/22/2018 12:55 PM

## 2018-10-22 NOTE — Progress Notes (Signed)
Dressing changed to patient's left foot. Patient tolerate very well.   Farley Ly RN

## 2018-10-22 NOTE — Progress Notes (Signed)
PROGRESS NOTE  Henry Mcpherson ELF:[id number] DOB: [birth year] DOA: 10/10/2018 PCP: System, Pcp Not In   LOS: 12 days   Brief narrative: Patient is a 63 y.o.malewith medical history significant ofsCHFwith EF 45%, A. fib, tachybradycardia syndromes/p PM,COPD, DM type II, PUD, lumbar stenosis, and cauda equina syndrome,s/plumbar decompression and fusion by Dr.Cram on 3/4;s/p left great toe amputation due to infection. On the last week of May, patient was admitted to [facility] in [place] for diabetic left toe infection.  He underwent IND by podiatry.  Blood culture at the facility was positive for MRSA..  But patient continued to have worsening back pain and bilateral leg weakness.   6/9- patient was transferred to our hospital for neurosurgical care. On arrival here, hemodynamically stable. COVID-19 negative.  Neurosurgery consult was called.  CT lumbar spine showed superior migration of left L4 screw, loosening of left L3 screw, discitis at T12-L1 with regional osteomyelitis and possible discitis at L3-4 and L5-S1. 6/11 - Patient had IR fluoroscopy guided biopsy of the thoracolumbar discitis. Tissue culture including fungal cultures negative  6/12 - He had TTE which was concerning for pacemaker lead vegetation versus thrombus.  6/15 - Pacemaker extracted by cardiology.  6/16 - MRI of thoracic and lumbar spine showed chronic T12-L1 osteomyelitis/discitis, concern for L3-L4 and L5-S1 discitis, severe spinal canal stenosis at T12-L1, residual moderate to severe bilateral neuroforaminal stenosis at L4-L5 and L5-S1. No abscess noted. 6/19 -right-sided thoracentesis with aspiration of 30 mL of thick brown fluid.  Pigtail catheter placed and attached to chest tube.  Subjective: Patient was seen and examined this morning.  Not in distress.  Continues to have stinging sensation on both eyes.  Right-sided pigtail catheter attached to drain.  Assessment/Plan:  Principal  Problem:   MRSA bacteremia Active Problems:   Chronic obstructive lung disease (HCC)   Cauda equina syndrome (HCC)   Spinal stenosis of lumbar region   HTN (hypertension)   HLD (hyperlipidemia)   GERD (gastroesophageal reflux disease)   Chronic systolic CHF (congestive heart failure) (HCC)   Atrial fibrillation, chronic   CAD (coronary artery disease)   History of amputation of left great toe (HCC)   Anemia   Stool guaiac positive   Left foot infection-great toe   Diabetes mellitus type 2 with complications (HCC)   Pressure injury of skin   Cardiac device in situ   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 -Left foot infection taken care of by podiatry at outside hospital.   -Blood culture positive for MRSA in outside hospital.  Remains on IV vancomycin there for 11 days before transfer to [place].  Repeat blood culture negative so far. -Rifampin added per ID on 6/10 -Planned for 6 to 8 weeks of IV antibiotics given discitis and osteomyelitis.  Tentative end date November 25, 2018. -PICC line in place..  Pacemaker lead vegetation -TEE showed 2.1 x 0.4 cm lesion in right atrium attached to the pacemaker lead -Pacemaker extracted on 6/15.No cardiopulmonary symptoms or significant event since extraction. -Cardiology/EP following  T12-L1, L3-L4 and L5-S1 discitis T12-L1 osteomyelitis History of cauda equina syndrome T12-L1 severe spinal canal stenosis L4-L5 and L5-S1 moderate to severe bilateral neuroforaminal stenosis -s/p oflumbar decompression and fusion by Dr.Cram on 3/4.  -CT and MRI as above -Status post IR fluoroscopy guided biopsy of the discitis on 10/12/2018 -Tissue fungal culture and AFB cultures negative but this isafterabout 12 days after he started antibiotic. -Follow  AFB culture. AFB smear negative. -Planned for 6 to 8 weeks of IV antibioticper ID -Neurosurgery planning further  decompression as an outpatient. -PRNDilaudid,Percocet and Flexeril abdominal for pain.  Reduce frequency of Dilaudid.  Has not used Flexeril. -Bowel regimen  Left foot infection-great toe: s/p ofamputation by podiatry at outside hospital.  -Antibiotic as above -Appreciate wound care input  Small loculated right pleural effusion with adjacent pleural thickening and probable atelectasis: -Patient with history of empyema per wife.  -Underwent aspiration of 30 mL of thick brown fluid.  Pigtail catheter in place attached to chest tube.   Chronic diastolic CHF:TTE on 6/10 with EF of 55 to 60% (improved), moderate LVH, indeterminate DD, moderately reduced RVSP no vegetation. On 07/05/2018 showed EF 45-50%. No cardiopulmonary symptoms.Edema improving. Renal function stable -Increasedp.o. Lasix to 40 mg twice daily on 6/16 (on 40 mg daily at home) -Daily weight, intake output and renal function -Monitor electrolytes closely and replenish  Permanent atrial Fibrillation: Currently rate controlled. CHA2DS2-VASc Score-5 but not on ACdue to GI bleed -Metoprolol and digoxin discontinued by cardiology -Pacemaker extracted on 6/15 due to vegetation -Closely monitor electrolytes and replenish aggressively.  CAD (coronary artery disease): s/p of stent and CABG. No chest pain. -Continue lipitor  Chronic obstructive lung disease (HCC):stable. -prn albuterol nebs  Controlled IDDM-2 with complications, diabetic foot infection: on NovoLog and Levemir 20 units at home. A1c 6.2. CBG within appropriate range with sliding scale insulin only. -Levemir on hold  Essential hypertension: Normotensive -IV Hydralazine prn -P.o. Lasix as above  Chronic blood loss anemia and anemia of chronic disease:FOBT positive. Hb 8.6 (about baseline).  -protonix 40 bid -Monitor H&H  Stage II pressure injuries over his left buttock: POAand stable. -Appreciate wound care input.  Dryness in both  eyes -artificial tears ordered.  Also ordered for ciprofloxacin ophthalmic drops.   Mobility:PT eval Diet:N.p.o. for now for procedure. DVT prophylaxis:SCD given chronic GI bleed/FOBT positive Code Status:Code Status: Full Code Family Communication:Patient states he has been updating his wife by himself Expected Discharge:continue inpatient management.  Needs chest tube out probably in the next 1 to 2 days.  Needs PT evaluation.  Consultants:  Neurosurgery  Infectious disease  EP/cardiology  CTS  IR  Microbiology:  Initial blood culture at outside hospital reportedly MRSA  Repeat blood cultures outside hospital reportedly negative  Blood culture on 10/10/2018-negative so far  MRSA PCR negative  COVID-19 negative  Tissue cultures negative so far  Tissue fungal cultures negative so far  Tissue AFB culture pending   Antimicrobials:  Anti-infectives (From admission, onward)   Start     Dose/Rate Route Frequency Ordered Stop   10/19/18 2100  vancomycin (VANCOCIN) IVPB 750 mg/150 ml premix     750 mg 150 mL/hr over 60 Minutes Intravenous Every 24 hours 10/19/18 0906     10/17/18 1100  rifampin (RIFADIN) capsule 300 mg     300 mg Oral Every 12 hours 10/17/18 1043     10/17/18 0000  ceFAZolin (ANCEF) IVPB 1 g/50 mL premix     1 g 100 mL/hr over 30 Minutes Intravenous Every 6 hours 10/16/18 2108 10/17/18 1324   10/16/18 1315  gentamicin (GARAMYCIN) 80 mg in sodium chloride 0.9 % 500 mL irrigation  Status:  Discontinued     80 mg Irrigation To [facility] 10/16/18 0832 10/16/18 2059   10/16/18 1315  ceFAZolin (ANCEF) IVPB 2g/100 mL premix     2 g 200 mL/hr over 30 Minutes [facility] 10/16/18 [id number]  10/16/18 1840   10/11/18 1400  rifampin (RIFADIN) capsule 300 mg  Status:  Discontinued     300 mg Oral Every 8 hours 10/11/18 1100 10/17/18 1043   10/10/18 2130  vancomycin (VANCOCIN) IVPB 1000 mg/200 mL premix  Status:  Discontinued      1,000 mg 200 mL/hr over 60 Minutes Intravenous Every 24 hours 10/10/18 2126 10/19/18 0906      Infusions:  . sodium chloride Stopped (10/17/18 0617)  . vancomycin Stopped (10/21/18 2211)    Scheduled Meds: . acidophilus  1 capsule Oral Daily  . atorvastatin  40 mg Oral Daily  . enoxaparin (LOVENOX) injection  40 mg Subcutaneous Q24H  . feeding supplement (ENSURE ENLIVE)  237 mL Oral BID BM  . furosemide  40 mg Oral BID  . insulin aspart  0-9 Units Subcutaneous TID WC  . nicotine  14 mg Transdermal Daily  . nutrition supplement (JUVEN)  1 packet Oral BID BM  . pantoprazole  40 mg Oral BID  . rifampin  300 mg Oral Q12H  . sodium chloride flush  10-40 mL Intracatheter Q12H  . sodium chloride flush  3 mL Intravenous Q12H    PRN meds: sodium chloride, acetaminophen, albuterol, cyclobenzaprine, hydrALAZINE, HYDROmorphone (DILAUDID) injection, ondansetron (ZOFRAN) IV, oxyCODONE-acetaminophen, polyvinyl alcohol, sodium chloride flush   Objective: Vitals:   10/21/18 2019 10/22/18 0931  BP: 131/84 108/75  Pulse: 61 90  Resp: 18 18  Temp: 98.4 F (36.9 C) 98.4 F (36.9 C)  SpO2: 100% 97%    Intake/Output Summary (Last 24 hours) at 10/22/2018 1359 Last data filed at 10/22/2018 1315 Gross per 24 hour  Intake 1320 ml  Output 2451 ml  Net -1131 ml   Filed Weights   10/20/18 2005 10/21/18 2019 10/22/18 0454  Weight: 83 kg 83 kg 83 kg   Weight change: 0 kg Body mass index is 23.49 kg/m.   Physical Exam: General exam: Appears calm and comfortable.  Skin: No rashes, lesions or ulcers. HEENT: Atraumatic, normocephalic, supple neck, no obvious bleeding Lungs: Clear to auscultation bilaterally, chest tube on right side CVS: Regular rate and rhythm, no murmur GI/Abd soft, nontender, nondistended, bowel CNS: Alert, awake, oriented x3 Psychiatry: Mood appropriate Extremities: Pedal edema 1+ bilaterally, left foot bandaged.  Data Review: I have personally reviewed the  laboratory data and studies available.  Recent Labs  Lab 10/16/18 0931 10/16/18 2020 10/18/18 0629 10/20/18 0531  WBC 12.0* 13.3* 9.6 9.5  HGB 9.3* 9.0* 8.6* 8.3*  HCT 31.7* 30.5* 29.1* 27.7*  MCV 87.3 88.4 87.4 87.7  PLT 394 369 340 328   Recent Labs  Lab 10/16/18 0931 10/16/18 2020 10/17/18 0654 10/18/18 0629 10/19/18 0717 10/20/18 0531  NA 138  --  137 137 138 137  K 3.5  --  3.7 3.6 3.7 3.6  CL 104  --  104 102 100 98  CO2 27  --  25 26 27 31   GLUCOSE 179*  --  177* 185* 132* 178*  BUN 13  --  13 14 12 15   CREATININE 0.66 0.66 0.75 0.83 0.85 0.78  CALCIUM 8.9  --  8.8* 8.9 9.0 8.9  MG 1.7  --  1.9 1.6* 1.7 1.8    Lorin Glass, MD  [facility] 10/22/2018

## 2018-10-23 ENCOUNTER — Inpatient Hospital Stay (HOSPITAL_COMMUNITY): Payer: Medicare Other

## 2018-10-23 LAB — CREATININE, SERUM
Creatinine, Ser: 0.72 mg/dL (ref 0.61–1.24)
GFR calc Af Amer: >60 mL/min (ref >60)
GFR calc non Af Amer: >60 mL/min (ref >60)

## 2018-10-23 LAB — GLUCOSE, CAPILLARY
Glucose-Capillary: 210 mg/dL — ABNORMAL HIGH (ref 70–99)
Glucose-Capillary: 281 mg/dL — ABNORMAL HIGH (ref 70–99)
Glucose-Capillary: 201 mg/dL — ABNORMAL HIGH (ref 70–99)
Glucose-Capillary: 198 mg/dL — ABNORMAL HIGH (ref 70–99)

## 2018-10-23 MED ORDER — GENERIC EXTERNAL MEDICATION
Status: DC
Start: ? — End: 2018-10-23

## 2018-10-23 NOTE — Evaluation (Signed)
Physical Therapy Evaluation Patient Details Name: Henry Mcpherson MRN: [id number] DOB: [birth year] Today's Date: 10/23/2018   History of Present Illness  63 y.o. male with medical history significant of sCHF with EF 45%, A. fib, tachybradycardia syndrome s/p PM, COPD, DM type II, PUD, lumbar stenosis, and cauda equina syndrome, s/p lumbar decompression and fusion by Dr. Saintclair Halsted on 3/4; s/p left great toe amputation due to infection, who is transferred from [facility] due to back pain, bilateral leg weakness, foot infection, MRSA bacteremia.  Clinical Impression  Pt presents with deficits in strength and mobility and will benefit from skilled PT to address deficits and improve functional independence. Pt reports he was not ambulatory at home and that his wife was assisting with the bedpan and he was bed level except for PT services.  Pt reports he is "not far" from his prior level of function and that his needs can be met by HHPT.    Follow Up Recommendations Supervision/Assistance - 24 hour;Home health PT    Equipment Recommendations       Recommendations for Other Services OT consult     Precautions / Restrictions Precautions Precautions: Fall Restrictions Other Position/Activity Restrictions: Lt post surgical shoe for standing due to toe amputation      Mobility  Bed Mobility     Rolling: Mod assist   Supine to sit: Mod assist Sit to supine: Mod assist      Transfers Overall transfer level: Needs assistance   Transfers: Sit to/from Stand Sit to Stand: +2 physical assistance         General transfer comment: pt able to stand with +2 assist from elevated bed. pt unable to come to full standing due to trunk and LE weakness.  Ambulation/Gait                Stairs            Wheelchair Mobility    Modified Rankin (Stroke Patients Only)       Balance     Sitting balance-Leahy Scale: Good                                       Pertinent Vitals/Pain Pain Assessment: Faces Faces Pain Scale: Hurts even more Pain Location: bilat LE Pain Descriptors / Indicators: Aching Pain Intervention(s): Limited activity within patient's tolerance;Repositioned    Home Living Family/patient expects to be discharged to:: Private residence Living Arrangements: Spouse/significant other Available Help at Discharge: Available 24 hours/day;Family Type of Home: House Home Access: Ramped entrance     Home Layout: One level Home Equipment: Grab bars - tub/shower;Grab bars - toilet;Walker - 2 wheels;Shower seat - built in;Wheelchair - manual;Hospital bed      Prior Function Level of Independence: Needs assistance   Gait / Transfers Assistance Needed: has not been amblulating lately, can stand for 10 seconds with HHPT  ADL's / Homemaking Assistance Needed: bed pan with wifes assistance  Comments: Patient has a male aide that comes in 1-2hrs twice a day (AM and PM) to assist with bathing and dressing. Required assistance for functional transfers.He has received home health PT and OT services in the past. He reports he has all the bands and handweights from his OT therapy.     Hand Dominance        Extremity/Trunk Assessment   Upper Extremity Assessment Upper Extremity Assessment: RUE deficits/detail RUE Deficits / Details:  limited by shoulder arthritis LUE Deficits / Details: generalized weakness    Lower Extremity Assessment Lower Extremity Assessment: Generalized weakness    Cervical / Trunk Assessment Cervical / Trunk Assessment: Kyphotic  Communication   Communication: No difficulties  Cognition Arousal/Alertness: Awake/alert Behavior During Therapy: WFL for tasks assessed/performed Overall Cognitive Status: Within Functional Limits for tasks assessed                                        General Comments General comments (skin integrity, edema, etc.): pt able to sit edge of bed x 10  minutes without assist    Exercises     Assessment/Plan    PT Assessment Patient needs continued PT services  PT Problem List Decreased strength;Decreased range of motion;Decreased activity tolerance;Decreased balance;Decreased mobility;Pain       PT Treatment Interventions DME instruction;Gait training;Functional mobility training;Therapeutic activities;Therapeutic exercise;Balance training;Neuromuscular re-education;Patient/family education;Wheelchair mobility training;Modalities    PT Goals (Current goals can be found in the Care Plan section)  Acute Rehab PT Goals Patient Stated Goal: go home PT Goal Formulation: With patient Time For Goal Achievement: 11/06/18 Potential to Achieve Goals: Fair    Frequency Min 3X/week   Barriers to discharge        Co-evaluation               AM-PAC PT "6 Clicks" Mobility  Outcome Measure Help needed turning from your back to your side while in a flat bed without using bedrails?: A Lot Help needed moving from lying on your back to sitting on the side of a flat bed without using bedrails?: A Lot Help needed moving to and from a bed to a chair (including a wheelchair)?: Total Help needed standing up from a chair using your arms (e.g., wheelchair or bedside chair)?: Total Help needed to walk in hospital room?: Total Help needed climbing 3-5 steps with a railing? : Total 6 Click Score: 8    End of Session Equipment Utilized During Treatment: Gait belt;Back brace;Other (comment)(surgical shoe) Activity Tolerance: Patient limited by fatigue Patient left: in bed;with call bell/phone within reach;with bed alarm set;with nursing/sitter in room Nurse Communication: Mobility status PT Visit Diagnosis: Muscle weakness (generalized) (M62.81);Difficulty in walking, not elsewhere classified (R26.2)    Time: 2725-3664 PT Time Calculation (min) (ACUTE ONLY): 32 min   Charges:   PT Evaluation $PT Eval Moderate Complexity: 1 Mod PT  Treatments $Therapeutic Exercise: 23-37 mins        Isabelle Course, PT, DPT  Ansel Ferrall 10/23/2018, 9:56 AM

## 2018-10-23 NOTE — Progress Notes (Signed)
PROGRESS NOTE  Henry ClarksJohn Lovelace Mcpherson ZOX:[id number] DOB: 1955/05/21 DOA: 10/10/2018 PCP: System, Pcp Not In   LOS: 13 days   Brief narrative: Patient is a63 y.o.malewith medical history significant ofsCHFwith EF 45%, A. fib, tachybradycardia syndromes/p PM,COPD, DM type II, PUD, lumbar stenosis, and cauda equina syndrome,s/plumbar decompression and fusion by Dr.Cram on 3/4;s/p left great toe amputation due to infection. On the last week of May, patient was admitted to [facility] in [place] for diabetic left toe infection.  He underwent IND by podiatry.  Blood culture at the facility was positive for MRSA..  But patient continued to have worsening back pain and bilateral leg weakness.   6/9- patient was transferred to our hospital for neurosurgical care. On arrival here, hemodynamically stable. COVID-19 negative.  Neurosurgery consult was called.  CT lumbar spine showed superior migration of left L4 screw, loosening of left L3 screw, discitis at T12-L1 with regional osteomyelitis and possible discitis at L3-4 and L5-S1. 6/11 - Patient had IR fluoroscopy guided biopsy of the thoracolumbar discitis. Tissue culture including fungal cultures negative  6/12 - He had TTE which was concerning for pacemaker lead vegetation versus thrombus.  6/15 - Pacemaker extracted by cardiology.  6/16 - MRI of thoracic and lumbar spine showed chronic T12-L1 osteomyelitis/discitis, concern for L3-L4 and L5-S1 discitis, severe spinal canal stenosis at T12-L1, residual moderate to severe bilateral neuroforaminal stenosis at L4-L5 and L5-S1. No abscess noted. 6/19 -right-sided thoracentesis with aspiration of 30 mL of thick brown fluid. Pigtail catheter placed and attached to chest tube.  Subjective: Patient was seen and examined this morning.  Pleasant elderly patient well.  Lying down in bed.  Not in distress.  Feels less stinging sensation in the eyes after antibiotic drops were started. Has  right-sided chest tube in place. Worked with physical therapy this morning.  Assessment/Plan:  Principal Problem:   MRSA bacteremia Active Problems:   Chronic obstructive lung disease (HCC)   Cauda equina syndrome (HCC)   Spinal stenosis of lumbar region   HTN (hypertension)   HLD (hyperlipidemia)   GERD (gastroesophageal reflux disease)   Chronic systolic CHF (congestive heart failure) (HCC)   Atrial fibrillation, chronic   CAD (coronary artery disease)   History of amputation of left great toe (HCC)   Anemia   Stool guaiac positive   Left foot infection-great toe   Diabetes mellitus type 2 with complications (HCC)   Pressure injury of skin   Cardiac device in situ   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 -Left foot infection taken care of by podiatry at outside hospital.   -Blood culture positive for MRSA in outside hospital.  Remains on IV vancomycin there for 11 days before transfer to us.  Repeat blood culture negative so far. -Rifampin added per ID on 6/10 -Plannedfor 6 to 8 weeks of IV antibiotics given discitis and osteomyelitis.  Tentative end date November 25, 2018. -PICC linein right arm.  Pacemaker lead vegetation -TEE showed 2.1 x 0.4 cm lesion in right atrium attached to the pacemaker lead -Pacemaker extracted on 6/15.No cardiopulmonary symptoms or significant event since extraction. -Cardiology/EP following.  Noted a plan to remove the stitches tomorrow  T12-L1, L3-L4 and L5-S1 discitis T12-L1 osteomyelitis History of cauda equina syndrome T12-L1 severe spinal canal stenosis L4-L5 and L5-S1 moderate to severe bilateral neuroforaminal stenosis -s/p oflumbar decompression and fusion by Dr.Cram on 3/4.  -CT and MRI as above -Status post IR  fluoroscopy guided biopsy of the discitis on 10/12/2018 -Tissue fungal culture and AFB cultures negative but this isafterabout 12 days  after he started antibiotic. -Follow AFB culture. AFB smear negative. -Planned for 6 to 8 weeks of IV antibioticper ID -Neurosurgery planning further decompression as an outpatient. -PRNDilaudid,Percocet and Flexeril abdominal for pain.   Continue to reduce frequency of Dilaudid.  Has not used Flexeril. -Bowel regimen  Left foot infection-great toe: s/p ofamputation by podiatry at outside hospital.  -Antibiotic as above. -Appreciate wound care input  Small loculated right pleural effusion with adjacent pleural thickening and probable atelectasis: -Underwent aspiration of 30 mL of thick brown fluid. Pigtail catheter in place attached to chest tube.  CT surgery and IR following.  Chronic diastolic CHF:TTE on 6/10 with EF of 55 to 60% (improved), moderate LVH, indeterminate DD, moderately reduced RVSP no vegetation. On 07/05/2018 showed EF 45-50%. No cardiopulmonary symptoms.Edema improving. Renal function stable -Increasedp.o. Lasix to 40 mg twice daily on 6/16 (on 40 mg daily at home) -Daily weight, intake output and renal function -Monitor electrolytes closely and replenish  Permanent atrial Fibrillation: Currently rate controlled. CHA2DS2-VASc Score-5 but not on ACdue to GI bleed -Metoprolol and digoxin discontinued by cardiology -Pacemaker extracted on 6/15 due tovegetation -Closely monitor electrolytes and replenish aggressively.  CAD (coronary artery disease): s/p of stent and CABG. Nochest pain. -Continue lipitor  Chronic obstructive lung disease (HCC):stable. -prn albuterol nebs  Controlled IDDM-2 with complications, diabetic foot infection: on NovoLog and Levemir 20 units at home. A1c 6.2. CBG within appropriate rangewith sliding scale insulin only. -Levemir on hold  Essential hypertension: Normotensive -IV Hydralazine prn -P.o. Lasix as above  Chronic blood loss anemia and anemia of chronic disease:FOBT positive.  -Hb 8.6 (about  baseline).  -protonix 40 bid -Monitor H&H  Stage II pressure injuries over his left buttock: POAand stable. -Appreciate wound care input.  Dryness in both eyes-artificial tears ordered.  Also ordered for ciprofloxacin ophthalmic drops.  Impaired mobility -PT/OT evaluation appreciated.  Patient prefers to go home.  He has home health, PT and enough support at home.  Mobility:PT eval Diet:N.p.o. for now for procedure. DVT prophylaxis:SCD given chronic GI bleed/FOBT positive Code Status:Code Status: Full Code Family Communication:Patient states he has been updating his wife by himself Expected Discharge:continue inpatient management. CT surgery/IR to decide on chest tube removal .   Consultants:  Neurosurgery  Infectious disease  EP/cardiology  CTS   IR  Microbiology:  Initial blood culture at outside hospital reportedly MRSA  Repeat blood cultures outside hospital reportedly negative  Blood culture on 10/10/2018-negative so far  MRSA PCR negative  COVID-19 negative  Tissue cultures negative so far  Tissue fungal cultures negative so far  Tissue AFB culture pending  Antimicrobials:  Anti-infectives (From admission, onward)   Start     Dose/Rate Route Frequency Ordered Stop   10/19/18 2100  vancomycin (VANCOCIN) IVPB 750 mg/150 ml premix     750 mg 150 mL/hr over 60 Minutes Intravenous Every 24 hours 10/19/18 0906     10/17/18 1100  rifampin (RIFADIN) capsule 300 mg     300 mg Oral Every 12 hours 10/17/18 1043     10/17/18 0000  ceFAZolin (ANCEF) IVPB 1 g/50 mL premix     1 g 100 mL/hr over 30 Minutes Intravenous Every 6 hours 10/16/18 2108 10/17/18 1324   10/16/18 1315  gentamicin (GARAMYCIN) 80 mg in sodium chloride 0.9 % 500 mL irrigation  Status:  Discontinued     80 mg  Irrigation To [facility] 10/16/18 0832 10/16/18 2059   10/16/18 1315  ceFAZolin (ANCEF) IVPB 2g/100 mL premix     2 g 200 mL/hr over 30 Minutes Intravenous To  [facility] 10/16/18 0832 10/16/18 1840   10/11/18 1400  rifampin (RIFADIN) capsule 300 mg  Status:  Discontinued     300 mg Oral Every 8 hours 10/11/18 1100 10/17/18 1043   10/10/18 2130  vancomycin (VANCOCIN) IVPB 1000 mg/200 mL premix  Status:  Discontinued     1,000 mg 200 mL/hr over 60 Minutes Intravenous Every 24 hours 10/10/18 2126 10/19/18 0906      Infusions:  . sodium chloride Stopped (10/17/18 0617)  . vancomycin Stopped (10/23/18 0001)    Scheduled Meds: . acidophilus  1 capsule Oral Daily  . atorvastatin  40 mg Oral Daily  . ciprofloxacin  2 drop Both Eyes Q4H while awake  . enoxaparin (LOVENOX) injection  40 mg Subcutaneous Q24H  . feeding supplement (ENSURE ENLIVE)  237 mL Oral BID BM  . furosemide  40 mg Oral BID  . insulin aspart  0-9 Units Subcutaneous TID WC  . nicotine  14 mg Transdermal Daily  . nutrition supplement (JUVEN)  1 packet Oral BID BM  . pantoprazole  40 mg Oral BID  . polyvinyl alcohol  1 drop Both Eyes TID  . rifampin  300 mg Oral Q12H  . sodium chloride flush  10-40 mL Intracatheter Q12H  . sodium chloride flush  3 mL Intravenous Q12H    PRN meds: sodium chloride, acetaminophen, albuterol, hydrALAZINE, HYDROmorphone (DILAUDID) injection, ondansetron (ZOFRAN) IV, oxyCODONE-acetaminophen, sodium chloride flush   Objective: Vitals:   10/23/18 0555 10/23/18 1016  BP: 120/78 114/68  Pulse: 80 83  Resp: 18 18  Temp: 98.9 F (37.2 C) 98.2 F (36.8 C)  SpO2: 98% 99%    Intake/Output Summary (Last 24 hours) at 10/23/2018 1405 Last data filed at 10/23/2018 0900 Gross per 24 hour  Intake 630 ml  Output 800 ml  Net -170 ml   Filed Weights   10/22/18 0454 10/22/18 2023 10/23/18 0500  Weight: 83 kg 83.2 kg 83.2 kg   Weight change: 0.2 kg Body mass index is 23.55 kg/m.   Physical Exam: General exam: Appears calm and comfortable.  Skin: No rashes, lesions or ulcers. HEENT: Atraumatic, normocephalic, supple neck, no obvious  bleeding Lungs: Clear to auscultation bilaterally, right-sided chest tube in place. CVS: Regular rate and rhythm, no murmur GI/Abd soft, nontender, nondistended, bowel sound present CNS: Alert, awake, oriented x3 Psychiatry: Mood appropriate Extremities: Trace to 1+ bilateral pedal edema, left foot has a bandage on.  Data Review: I have personally reviewed the laboratory data and studies available.  Recent Labs  Lab 10/16/18 2020 10/18/18 0629 10/20/18 0531  WBC 13.3* 9.6 9.5  HGB 9.0* 8.6* 8.3*  HCT 30.5* 29.1* 27.7*  MCV 88.4 87.4 87.7  PLT 369 340 328   Recent Labs  Lab 10/17/18 0654 10/18/18 0629 10/19/18 0717 10/20/18 0531 10/23/18 0416  NA 137 137 138 137  --   K 3.7 3.6 3.7 3.6  --   CL 104 102 100 98  --   CO2 25 26 27 31   --   GLUCOSE 177* 185* 132* 178*  --   BUN 13 14 12 15   --   CREATININE 0.75 0.83 0.85 0.78 0.72  CALCIUM 8.8* 8.9 9.0 8.9  --   MG 1.9 1.6* 1.7 1.8  --     Terrilee Croak, MD  [facility] 10/23/2018

## 2018-10-23 NOTE — Progress Notes (Signed)
PHARMACY CONSULT NOTE FOR:  OUTPATIENT  PARENTERAL ANTIBIOTIC THERAPY (OPAT)  Indication: Disseminated MRSA Infection  Regimen: Vancomycin 750mg  q 24 hours  End date: 11/13/2018 Note: Patient to have spinal surgery/debridement after several weeks on antibiotics, then will continue IV therapy at least 6 weeks after that procedure.  IV antibiotic discharge orders are pended. To discharging provider:  please sign these orders via discharge navigator,  Select New Orders & click on the button choice - Manage This Unsigned Work.     Thank you for allowing pharmacy to be a part of this patient's care.  Azzie Roup D PGY1 Pharmacy Resident  Phone [phone] Please use AMION for clinical pharmacists numbers  10/23/2018      10:37 AM

## 2018-10-23 NOTE — Plan of Care (Signed)
°  Problem: Coping: °Goal: Level of anxiety will decrease °Outcome: Progressing °  °

## 2018-10-23 NOTE — Progress Notes (Addendum)
      [address]       [place] [address]             [phone]      7 Days Post-Op Procedure(s) (LRB): PACEMAKER EXTRACTION (N/A) TRANSESOPHAGEAL ECHOCARDIOGRAM (TEE) (N/A) Subjective: Feels okay this morning.   Objective: Vital signs in last 24 hours: Temp:  [98.5 F (36.9 C)-98.9 F (37.2 C)] 98.9 F (37.2 C) (06/22 0555) Pulse Rate:  [55-83] 80 (06/22 0555) Cardiac Rhythm: Atrial fibrillation (06/22 0701) Resp:  [18] 18 (06/22 0555) BP: (118-127)/(76-84) 120/78 (06/22 0555) SpO2:  [97 %-98 %] 98 % (06/22 0555) Weight:  [83.2 kg] 83.2 kg (06/22 0500)     Intake/Output from previous day: 06/21 0701 - 06/22 0700 In: 990 [P.O.:840; IV Piggyback:150] Out: 1900 [Urine:1900] Intake/Output this shift: No intake/output data recorded.  General appearance: alert, cooperative and no distress Heart: irregularly irregular Lungs: clear to auscultation bilaterally Abdomen: soft, non-tender; bowel sounds normal; no masses,  no organomegaly Extremities: venous lower ext discoloration, great toe missing on either side Wound: chest tube site is c/d/i  Lab Results: No results for input(s): WBC, HGB, HCT, PLT in the last 72 hours. BMET:  Recent Labs    10/23/18 0416  CREATININE 0.72    PT/INR:  Recent Labs    10/20/18 1247  LABPROT 14.8  INR 1.2   ABG    Component Value Date/Time   PHART 7.428 07/05/2018 1706   HCO3 31.6 (H) 07/05/2018 1706   TCO2 33 (H) 07/05/2018 1706   O2SAT 100.0 07/05/2018 1706   CBG (last 3)  Recent Labs    10/22/18 1632 10/22/18 2022 10/23/18 0653  GLUCAP 198* 253* 210*    Assessment/Plan: S/P Procedure(s) (LRB): PACEMAKER EXTRACTION (N/A) TRANSESOPHAGEAL ECHOCARDIOGRAM (TEE) (N/A)  On 6/19 -right-sided thoracentesis with aspiration of 30 mL of thick brown fluid. Pigtail catheter placed and attached to chest tube.  1. CXR on 6/19 showed mild pathy atelectasis or pneumonia at the right lung base with  improvement.  2. No fevers. Continue current antibiotic regimen. Remains on room air with good oxygen saturation. WBC 9.5 3. A. Fib rate low 100s. Care per primary.  4. The chest tube was not to suction all weekend. Let nursing know to put back on 20cm of suction per the order that was placed Friday.   Plan: Will order another CXR for today since he has not had one since Friday. Patient is nonambulatory due to back pain. Consider PT/OT consult if not already following. Not much drainage out of the pigtail catheter-60cc since insertion.    LOS: 13 days    Henry Mcpherson 10/23/2018   Chest xray still not done this afternoon, ordered  again Likely d/c chest tube tomorrow, low   drainage  I have seen and examined Henry Mcpherson and agree with the above assessment  and plan.  Grace Isaac MD Beeper [phone] Office [phone] 10/23/2018 4:30 PM

## 2018-10-24 LAB — CBC WITH DIFFERENTIAL/PLATELET
Abs Immature Granulocytes: 0.07 10*3/uL (ref 0.00–0.07)
Basophils Absolute: 0.1 10*3/uL (ref 0.0–0.1)
Basophils Relative: 1 %
Eosinophils Absolute: 0.2 10*3/uL (ref 0.0–0.5)
Eosinophils Relative: 2 %
HCT: 29.7 % — ABNORMAL LOW (ref 39.0–52.0)
Hemoglobin: 8.8 g/dL — ABNORMAL LOW (ref 13.0–17.0)
Immature Granulocytes: 1 %
Lymphocytes Relative: 7 %
Lymphs Abs: 0.9 10*3/uL (ref 0.7–4.0)
MCH: 26.7 pg (ref 26.0–34.0)
MCHC: 29.6 g/dL — ABNORMAL LOW (ref 30.0–36.0)
MCV: 90 fL (ref 80.0–100.0)
Monocytes Absolute: 0.8 10*3/uL (ref 0.1–1.0)
Monocytes Relative: 7 %
Neutro Abs: 10.7 10*3/uL — ABNORMAL HIGH (ref 1.7–7.7)
Neutrophils Relative %: 82 %
Platelets: 317 10*3/uL (ref 150–400)
RBC: 3.3 MIL/uL — ABNORMAL LOW (ref 4.22–5.81)
RDW: 20 % — ABNORMAL HIGH (ref 11.5–15.5)
WBC: 12.8 10*3/uL — ABNORMAL HIGH (ref 4.0–10.5)
nRBC: 0 % (ref 0.0–0.2)

## 2018-10-24 LAB — BASIC METABOLIC PANEL
Anion gap: 11 (ref 5–15)
BUN: 25 mg/dL — ABNORMAL HIGH (ref 8–23)
CO2: 32 mmol/L (ref 22–32)
Calcium: 8.8 mg/dL — ABNORMAL LOW (ref 8.9–10.3)
Chloride: 91 mmol/L — ABNORMAL LOW (ref 98–111)
Creatinine, Ser: 0.73 mg/dL (ref 0.61–1.24)
GFR calc Af Amer: >60 mL/min (ref >60)
GFR calc non Af Amer: >60 mL/min (ref >60)
Glucose, Bld: 288 mg/dL — ABNORMAL HIGH (ref 70–99)
Potassium: 2.8 mmol/L — ABNORMAL LOW (ref 3.5–5.1)
Sodium: 134 mmol/L — ABNORMAL LOW (ref 135–145)

## 2018-10-24 LAB — GLUCOSE, CAPILLARY
Glucose-Capillary: 199 mg/dL — ABNORMAL HIGH (ref 70–99)
Glucose-Capillary: 282 mg/dL — ABNORMAL HIGH (ref 70–99)
Glucose-Capillary: 220 mg/dL — ABNORMAL HIGH (ref 70–99)
Glucose-Capillary: 147 mg/dL — ABNORMAL HIGH (ref 70–99)

## 2018-10-24 LAB — MAGNESIUM: Magnesium: 1.5 mg/dL — ABNORMAL LOW (ref 1.7–2.4)

## 2018-10-24 MED ORDER — HEPARIN SOD (PORK) LOCK FLUSH 100 UNIT/ML IV SOLN: 250.0000 [IU] | INTRAVENOUS | Status: DC | PRN

## 2018-10-24 MED ORDER — CIPROFLOXACIN HCL 0.3 % OP SOLN: 2.0000 [drp] | OPHTHALMIC | 0 refills | Status: AC

## 2018-10-24 MED ORDER — POLYVINYL ALCOHOL 1.4 % OP SOLN: 1.0000 [drp] | Freq: Three times a day (TID) | OPHTHALMIC | 0 refills | Status: AC

## 2018-10-24 MED ORDER — INSULIN ASPART 100 UNIT/ML ~~LOC~~ SOLN: 0.0000 [IU] | Freq: Three times a day (TID) | SUBCUTANEOUS | 11 refills | Status: DC

## 2018-10-24 MED ORDER — VANCOMYCIN HCL IN DEXTROSE 750-5 MG/150ML-% IV SOLN
750.0000 mg | INTRAVENOUS | Status: DC
  Filled 2018-10-24: qty 150

## 2018-10-24 MED ORDER — MAGNESIUM OXIDE 400 (241.3 MG) MG PO TABS
400.0000 mg | ORAL_TABLET | Freq: Once | ORAL | Status: AC
  Administered 2018-10-24: 400 mg via ORAL
  Filled 2018-10-24: qty 1

## 2018-10-24 MED ORDER — OXYCODONE-ACETAMINOPHEN 5-325 MG PO TABS: 1.0000 | ORAL_TABLET | ORAL | 0 refills | Status: AC | PRN

## 2018-10-24 MED ORDER — VANCOMYCIN IV (FOR PTA / DISCHARGE USE ONLY): 750.0000 mg | INTRAVENOUS | 0 refills | Status: AC

## 2018-10-24 MED ORDER — POTASSIUM CHLORIDE CRYS ER 20 MEQ PO TBCR
40.0000 meq | EXTENDED_RELEASE_TABLET | ORAL | Status: AC
  Administered 2018-10-24 (×2): 40 meq via ORAL
  Filled 2018-10-24 (×2): qty 2

## 2018-10-24 MED ORDER — FUROSEMIDE 40 MG PO TABS: 40.0000 mg | ORAL_TABLET | Freq: Two times a day (BID) | ORAL | 0 refills | Status: DC

## 2018-10-24 MED ORDER — LEVEMIR FLEXTOUCH 100 UNIT/ML ~~LOC~~ SOPN: 5.0000 [IU] | PEN_INJECTOR | Freq: Every evening | SUBCUTANEOUS | 11 refills | Status: DC

## 2018-10-24 MED ORDER — CYCLOBENZAPRINE HCL 5 MG PO TABS
5.0000 mg | ORAL_TABLET | Freq: Once | ORAL | Status: AC
  Administered 2018-10-24: 5 mg via ORAL

## 2018-10-24 MED ORDER — POTASSIUM CHLORIDE CRYS ER 20 MEQ PO TBCR: 20.0000 meq | EXTENDED_RELEASE_TABLET | Freq: Every day | ORAL | 0 refills | Status: DC

## 2018-10-24 MED ORDER — VANCOMYCIN HCL IN DEXTROSE 750-5 MG/150ML-% IV SOLN: 750.0000 mg | INTRAVENOUS | 0 refills | Status: AC

## 2018-10-24 MED ORDER — RIFAMPIN 300 MG PO CAPS: 300.0000 mg | ORAL_CAPSULE | Freq: Two times a day (BID) | ORAL | 0 refills | Status: AC

## 2018-10-24 NOTE — Progress Notes (Signed)
Inpatient Diabetes Program Recommendations  AACE/ADA: New Consensus Statement on Inpatient Glycemic Control  Target Ranges:  Prepandial:   less than 140 mg/dL      Peak postprandial:   less than 180 mg/dL (1-2 hours)      Critically ill patients:  140 - 180 mg/dL   Results for Henry Mcpherson, Henry Mcpherson (MRN [id number]) as of 10/24/2018 12:56  Ref. Range 10/23/2018 06:53 10/23/2018 11:30 10/23/2018 16:38 10/23/2018 21:00 10/24/2018 06:53 10/24/2018 11:09  Glucose-Capillary Latest Ref Range: 70 - 99 mg/dL 210 (H) 281 (H) 201 (H) 198 (H) 199 (H) 282 (H)   Review of Glycemic Control  Diabetes history: DM2 Outpatient Diabetes medications: Levemir 15-20 units every evening Current orders for Inpatient glycemic control: Novolog 0-9 units TID with meals  Inpatient Diabetes Program Recommendations:   Insulin - Basal: Please consider ordering Levemir 5 units daily.  Insulin - Meal Coverage: Please consider ordering Novolog 3 units TID with meals for meal coverge if patient eats at least 50% of meals.  Thanks, Barnie Alderman, RN, MSN, CDE Diabetes Coordinator Inpatient Diabetes Program [phone] (Team Pager from 8am to 5pm)

## 2018-10-24 NOTE — Progress Notes (Signed)
      [address]       [place] [address]             [phone]      8 Days Post-Op Procedure(s) (LRB): PACEMAKER EXTRACTION (N/A) TRANSESOPHAGEAL ECHOCARDIOGRAM (TEE) (N/A) Subjective: Feels ok, only 25 cc from tube yesterday  Objective: Vital signs in last 24 hours: Temp:  [97.9 F (36.6 C)-98.4 F (36.9 C)] 98.3 F (36.8 C) (06/23 0859) Pulse Rate:  [58-88] 58 (06/23 0859) Cardiac Rhythm: Atrial fibrillation (06/23 0900) Resp:  [16-18] 18 (06/23 0859) BP: (109-128)/(68-84) 128/77 (06/23 0859) SpO2:  [97 %-100 %] 97 % (06/23 0859)  Hemodynamic parameters for last 24 hours:    Intake/Output from previous day: 06/22 0701 - 06/23 0700 In: 960 [P.O.:960] Out: 1476 [Urine:1450; Stool:1; Chest Tube:25] Intake/Output this shift: No intake/output data recorded.  General appearance: alert, cooperative, fatigued and no distress Heart: irregularly irregular rhythm Lungs: dim in bases Abdomen: soft, nontender  Lab Results: No results for input(s): WBC, HGB, HCT, PLT in the last 72 hours. BMET:  Recent Labs    10/23/18 0416  CREATININE 0.72    PT/INR: No results for input(s): LABPROT, INR in the last 72 hours. ABG    Component Value Date/Time   PHART 7.428 07/05/2018 1706   HCO3 31.6 (H) 07/05/2018 1706   TCO2 33 (H) 07/05/2018 1706   O2SAT 100.0 07/05/2018 1706   CBG (last 3)  Recent Labs    10/23/18 1638 10/23/18 2100 10/24/18 0653  GLUCAP 201* 198* 199*    Meds Scheduled Meds: . acidophilus  1 capsule Oral Daily  . atorvastatin  40 mg Oral Daily  . ciprofloxacin  2 drop Both Eyes Q4H while awake  . enoxaparin (LOVENOX) injection  40 mg Subcutaneous Q24H  . feeding supplement (ENSURE ENLIVE)  237 mL Oral BID BM  . furosemide  40 mg Oral BID  . insulin aspart  0-9 Units Subcutaneous TID WC  . nicotine  14 mg Transdermal Daily  . nutrition supplement (JUVEN)  1 packet Oral BID BM  . pantoprazole  40 mg Oral BID  . polyvinyl  alcohol  1 drop Both Eyes TID  . rifampin  300 mg Oral Q12H  . sodium chloride flush  10-40 mL Intracatheter Q12H  . sodium chloride flush  3 mL Intravenous Q12H   Continuous Infusions: . sodium chloride Stopped (10/17/18 0617)  . vancomycin 750 mg (10/23/18 1631)   PRN Meds:.sodium chloride, acetaminophen, albuterol, hydrALAZINE, HYDROmorphone (DILAUDID) injection, ondansetron (ZOFRAN) IV, oxyCODONE-acetaminophen, sodium chloride flush  Xrays Dg Chest Port 1 View  Result Date: 10/23/2018 CLINICAL DATA:  Chest tube, empyema, hypertension, atrial fibrillation, diabetes mellitus, CHF, COPD, smoker, CHF, coronary artery disease EXAM: PORTABLE CHEST 1 VIEW COMPARISON:  Portable exam 1643 hours compared to 10/20/2018 FINDINGS: Pigtail thoracostomy tube identified at inferior RIGHT hemithorax. RIGHT arm PICC line tip projects over SVC. Normal heart size post CABG. Mediastinal contours and pulmonary vascularity normal. RIGHT basilar atelectasis. Remaining lungs clear. No pneumothorax. IMPRESSION: RIGHT basilar atelectasis. No pneumothorax following RIGHT thoracostomy tube placement. Electronically Signed   By: Lavonia Dana M.D.   On: 10/23/2018 17:01    Assessment/Plan: S/P Procedure(s) (LRB): PACEMAKER EXTRACTION (N/A) TRANSESOPHAGEAL ECHOCARDIOGRAM (TEE) (N/A)  1 minimal right chest tube drainage, possible d/c tube soon. 2 medical management per primary and consultants   LOS: 14 days    Henry Mcpherson [facility] 10/24/2018 Pager [phone]

## 2018-10-24 NOTE — TOC Progression Note (Addendum)
Transition of Care [name]) - Progression Note    Patient Details  Name: Henry Mcpherson MRN: [id number] Date of Birth: July 18, 1955  [name] CM/SW Contact  Bartholomew Crews, RN Phone Number: [phone] 10/24/2018, 5:16 PM  Clinical Narrative:    Patient ready to transition home this evening. Notified [name] of plans to transition home - will fax H&P, DC summary, clinical notes, and orders. Notified [facility]. Spoke with spouse to review plan.   However, unable to set up transportation tonight d/t late hour. [facility] will pick up patient about 11 am tomorrow morning for transition home - confirmed and arranged with [name] at [facility] [place] for patient to be picked up at 11 am tomorrow morning.   MD notified.   CM to continue to follow for transition of care needs.   Expected Discharge Plan: [place] Barriers to Discharge: Continued Medical Work up  Expected Discharge Plan and Services Expected Discharge Plan: [place] In-house Referral: NA Discharge Planning Services: CM Consult Post Acute Care Choice: [name] arrangements for the past 2 months: Single Family Home Expected Discharge Date: 10/24/18               DME Arranged: N/A DME Agency: NA       HH Arranged: RN, PT [name]: Other - See comment([facility])         Social Determinants of Health (SDOH) Interventions    Readmission Risk Interventions No flowsheet data found.

## 2018-10-24 NOTE — Discharge Summary (Addendum)
Physician Discharge Summary  Henry Mcpherson OFB:[id number] DOB: Apr 23, 1956 DOA: 10/10/2018  PCP: System, Pcp Not In  Admit date: 10/10/2018 Discharge date: 10/25/2018  Admitted From: [facility] Discharge disposition: Home with home health PT   Code Status: Full Code   Recommendations for Outpatient Follow-Up:   1. Follow-up with neurosurgery as an outpatient for vertebral decompression. For discharge labs, per ID, patient needs to have Vanc trough checked weekly as well as CMP twice a week to monitor renal function and liver enzymes (while on Rifampin).  Discharge Diagnosis:   Principal Problem:   MRSA bacteremia Active Problems:   Chronic obstructive lung disease (HCC)   Cauda equina syndrome (HCC)   Spinal stenosis of lumbar region   HTN (hypertension)   HLD (hyperlipidemia)   GERD (gastroesophageal reflux disease)   Chronic systolic CHF (congestive heart failure) (HCC)   Atrial fibrillation, chronic   CAD (coronary artery disease)   History of amputation of left great toe (HCC)   Anemia   Stool guaiac positive   Left foot infection-great toe   Diabetes mellitus type 2 with complications (HCC)   Pressure injury of skin   Cardiac device in situ   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 fib, tachybradycardia syndromes/p PM,COPD, DM type II, PUD, lumbar stenosis, and cauda equina syndrome,s/plumbar decompression and fusion by Dr.Cram on 3/4;s/p left great toe amputation due to infection. [facility] [place] [facility] He underwent IND by podiatry. Blood culture at the facility was positive for MRSA.. But patient continued to have worsening back pain and bilateral leg weakness.   6/9-patient was  transferred to our hospital for neurosurgical care.On arrival here, hemodynamically stable. COVID-19 negative. Neurosurgery consult was called.CT lumbar spine showed superior migration of left L4 screw, loosening of left L3 screw, discitis at T12-L1 with regional osteomyelitis and possible discitis at L3-4 and L5-S1. 6/11 -Patient had IR fluoroscopy guided biopsy of the thoracolumbar discitis.Tissue culture including fungal cultures negative 6/12 -He had TTE which was concerning for pacemaker lead vegetation versus thrombus. 6/15 - Pacemaker extracted by cardiology. 6/16 -MRI of thoracic and lumbar spine showed chronic T12-L1 osteomyelitis/discitis, concern for L3-L4 and L5-S1 discitis, severe spinal canal stenosis at T12-L1, residual moderate to severe bilateral neuroforaminal stenosis at L4-L5 and L5-S1. No abscess noted. 6/19 -right-sided thoracentesis with aspiration of30 mL of thick brown fluid. Pigtail catheterplaced andattached to chest tube.  Hospital Course:  MRSA bacteremia: Likely from left foot infection. -Left foot infection taken care of by podiatry at outside hospital. -Blood culture positive for MRSA in outside hospital. Was on IV vancomycin there for 11 days before transfer to [place]. Repeat blood culture negative so far. -ID consult was obtained.  Rifampin was added on 6/10. -Plannedfor 6 to 8 weeks of IV antibiotics given discitis and osteomyelitis.Tentative end date November 25, 2018. -PICC linein right arm.  Pacemaker lead vegetation -TEE showed 2.1 x 0.4 cm lesion in right atrium attached to the pacemaker lead -Pacemaker extracted on 6/15.No cardiopulmonary symptoms or significant event since extraction.  T12-L1, L3-L4 and L5-S1 discitis T12-L1 osteomyelitis History of cauda equina syndrome T12-L1 severe spinal canal stenosis L4-L5 and L5-S1 moderate to severe bilateral neuroforaminal stenosis -s/p oflumbar decompression and fusion by Dr.Cram on  3/4.  -CT and MRI  as above -Status post IR fluoroscopy guided biopsy of the discitis on 10/12/2018 -Tissuefungal culture and AFB culturesnegative but this isafterabout 12 days after he started antibiotic. -Follow AFB smear and culture negative.   -Plannedfor 6 to 8 weeks of IV antibioticper ID -Neurosurgery planning further decompressionas an outpatient. -PRNDilaudid,Percocet and Flexeril abdominal for pain.Continue to reduce frequency of Dilaudid. -Has not used Flexeril, encouraged to use it. -Bowel regimen  Left foot infection-great toe: s/p ofamputation by podiatry at outside hospital.  -Antibiotic as above. -Appreciate wound care input  Small loculated right pleural effusion with adjacent pleural thickening and probable atelectasis: -Underwent aspiration of 30 mL of thick brown fluid on 6/19. Pigtail catheter was attached to suction.  Catheter removed today.   Chronic diastolic CHF:TTE on 7/12 with EF of 55 to 60% (improved), moderate LVH, indeterminate DD, moderately reduced RVSP no vegetation. On 07/05/2018 showed EF 45-50%. No cardiopulmonary symptoms.Edema improving. Renal function stable. -Increasedp.o. Lasix to 40 mg twice daily on 6/16 (on 40 mg daily at home).  On blood work today, potassium is low at 2.8 and magnesium is low at 1.5.  To replace with 80 mEq of oral potassium chloride and 400 mg of oral magnesium oxide.  Also to continue potassium 20 mEq daily at home. -Renal function is stable. -Monitor electrolytes closely and replenish  Permanent atrial Fibrillation:  Currently self rate controlled. Metoprolol and digoxin discontinued by cardiology -Pacemaker extracted on 6/15 due tovegetation -CHA2DS2-VASc Score-5 but not on ACdue to GI bleed  CAD (coronary artery disease): s/p of stent and CABG. Nochest pain. -Continue lipitor  Chronic obstructive lung disease (HCC):stable. -prn albuterol nebs  Controlled IDDM-2 with complications,  diabetic foot infection: on NovoLog and Levemir 20 units at home. A1c 6.2.  While in the hospital, Levemir is on hold.  Patient is on sliding scale insulin only.  Blood sugar is mostly close to 200.  Resume Levemir at 5 units at home.  Resume sliding scale insulin as well.  Essential hypertension:   -Blood pressure control on p.o. Lasix.  Patient is on metolazone at home which was held on this hospitalization.  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  -Hb 8.6 (about baseline).  -protonix 40 bid -Hemoglobin remains at baseline.  Stage II pressure injuries over his left buttock: POAand stable. -Appreciate wound care input.  Dryness in both eyes-artificial tears ordered.Also ordered for ciprofloxacin ophthalmic drops. Continue for next 7 days.  Impaired mobility -PT/OT evaluation appreciated.  Patient prefers to go home.  He has home health, PT and enough support at home.  Mobility:PT eval Diet:N.p.o. for now for procedure. DVT prophylaxis:SCD given chronic GI bleed/FOBT positive Code Status:Code Status: Full Code Family Communication:Patient states he has been updating his wife by himself Expected Discharge:continue inpatient management. CT surgery/IR to decide on chest tube removal .   Consultants:  Neurosurgery  Infectious disease  EP/cardiology  CTS   IR  Microbiology:  Initial blood culture at outside hospital reportedly MRSA  Repeat blood cultures outside hospital reportedly negative  Blood culture on 10/10/2018-negative so far  MRSA PCR negative  COVID-19 negative  Tissue cultures negative so far  Tissue fungal cultures negative so far  Tissue AFB culture pending   Subjective:  Patient was seen and examined this morning.  Pleasant elderly male.  Lying down in bed.  Chest tube on right side.  It was pulled out in the afternoon today.  Feels better but wants to go home.  Discharge Exam:  Vitals:    10/24/18 1456 10/24/18 1625 10/24/18 2103 10/25/18 0506  BP: (!) 143/92 108/72 132/62 123/73  Pulse: (!) 111 (!) 106 (!) 58 (!) 110  Resp: 18 18 20 18   Temp: 98.2 F (36.8 C) 98.6 F (37 C) 99.2 F (37.3 C) 98.9 F (37.2 C)  TempSrc: Oral Oral Oral Oral  SpO2: 99% 100% 98% 98%  Weight:    82.1 kg  Height:        Body mass index is 23.24 kg/m.  General exam: Appears calm and comfortable.  Very pleasant elderly male.  Not in distress Skin: No rashes, lesions or ulcers. HEENT: Atraumatic, normocephalic, supple neck, no obvious bleeding Lungs: Clear to auscultation bilaterally CVS: Regular rate and rhythm, no murmur GI/Abd soft, nontender, nondistended, bowel sound present CNS: Alert, awake, oriented x3 Psychiatry: Mood appropriate Extremities: Bilateral 1+ pitting pedal edema chronic stasis changes, bandage on left foot.  Discharge Instructions:  Wound care: Continue at home with home health RN. Discharge Instructions    Diet - low sodium heart healthy   Complete by: As directed    Home infusion instructions [facility] May follow [place] Dosing Protocol; May administer Cathflo as needed to maintain patency of vascular access device.; Flushing of vascular access device: per [facility] Protocol: 0.9% NaCl pre/post medica...   Complete by: As directed    Instructions: May follow [place] Dosing Protocol   Instructions: May administer Cathflo as needed to maintain patency of vascular access device.   Instructions: Flushing of vascular access device: per [facility] Protocol: 0.9% NaCl pre/post medication administration and prn patency; Heparin 100 u/ml, 71m for implanted ports and Heparin 10u/ml, 525mfor all other central venous catheters.   Instructions: May follow [facility] Anaphylaxis Protocol for First Dose Administration in the home: 0.9% NaCl at 25-50 ml/hr to maintain IV access for protocol meds. Epinephrine 0.3 ml IV/IM PRN and Benadryl 25-50 IV/IM PRN s/s of anaphylaxis.    Instructions: AdLaurys Stationnfusion Coordinator (RN) to assist per patient IV care needs in the home PRN.   Increase activity slowly   Complete by: As directed      Follow-up Information    TaEvans LanceMD Follow up.   Specialty: Cardiology Why: 10/31/2018 @ 10:00AM, wound check visit Contact information[id number]        Allergies as of 10/25/2018      Reactions   Warfarin And Related Itching, Rash      Medication List    STOP taking these medications   digoxin 0.125 MG tablet Commonly known as: LANOXIN   metolazone 2.5 MG tablet Commonly known as: ZAROXOLYN   metoprolol succinate 100 MG 24 hr tablet Commonly known as: TOPROL-XL   nicotine 14 mg/24hr patch Commonly known as: NICODERM CQ - dosed in mg/24 hours   traMADol 50 MG tablet Commonly known as: ULTRAM   traZODone 50 MG tablet Commonly known as: DESYREL     TAKE these medications   acetaminophen 325 MG tablet Commonly known as: TYLENOL Take 2 tablets (650 mg total) by mouth every 4 (four) hours as needed for mild pain ((score 1 to 3) or temp > 100.5).   albuterol 108 (90 Base) MCG/ACT inhaler Commonly known as: VENTOLIN HFA Inhale 1-2 puffs into the lungs every 6 (six) hours as needed for wheezing or shortness of breath.   atorvastatin 40 MG tablet Commonly known as: LIPITOR Take 40 mg by  mouth daily.   ciprofloxacin 0.3 % ophthalmic solution Commonly known as: CILOXAN Place 2 drops into both eyes every 4 (four) hours while awake for 10 days. Administer 1 drop, every 2 hours, while awake, for 2 days. Then 1 drop, every 4 hours, while awake, for the next 5 days.   [employer] VITAMIN B12 1000 MCG tablet Generic drug: cyanocobalamin Take 1,000 mcg by mouth daily.   cyclobenzaprine 10 MG tablet Commonly known as: FLEXERIL Take 1 tablet (10 mg total) by mouth 2 (two) times daily as needed for muscle spasms.   furosemide 40 MG tablet  Commonly known as: LASIX Take 1 tablet (40 mg total) by mouth 2 (two) times daily for 30 days. What changed: when to take this   insulin aspart 100 UNIT/ML injection Commonly known as: novoLOG Inject 0-9 Units into the skin 3 (three) times daily with meals.   Levemir FlexTouch 100 UNIT/ML Pen Generic drug: Insulin Detemir Inject 5 Units into the skin every evening. What changed: how much to take   oxyCODONE-acetaminophen 5-325 MG tablet Commonly known as: PERCOCET/ROXICET Take 1 tablet by mouth every 4 (four) hours as needed for up to 5 days for moderate pain.   pantoprazole 40 MG tablet Commonly known as: PROTONIX Take 40 mg by mouth 2 (two) times daily.   polyvinyl alcohol 1.4 % ophthalmic solution Commonly known as: LIQUIFILM TEARS Place 1 drop into both eyes 3 (three) times daily for 7 days.   potassium chloride SA 20 MEQ tablet Commonly known as: K-DUR Take 1 tablet (20 mEq total) by mouth daily for 30 days.   PROBIOTIC DAILY PO Take 1 capsule by mouth daily.   rifampin 300 MG capsule Commonly known as: RIFADIN Take 1 capsule (300 mg total) by mouth every 12 (twelve) hours.   senna-docusate 8.6-50 MG tablet Commonly known as: Senokot-S Take 1 tablet by mouth 2 (two) times daily as needed for mild constipation.   sucralfate 1 g tablet Commonly known as: CARAFATE Take 1 g by mouth 4 (four) times daily.   vancomycin  IVPB Inject 750 mg into the vein daily for 28 days. Indication: Disseminated MRSA Infection  Last Day of Therapy:  28 days  Labs - Sunday/Monday:  CBC/D, BMP, and vancomycin trough. Labs - Thursday:  BMP and vancomycin trough Labs - Every other week:  ESR and CRP   Vancomycin 750-5 MG/150ML-% Soln Commonly known as: VANCOCIN Inject 150 mLs (750 mg total) into the vein daily for 30 days.            Home Infusion Instuctions  (From admission, onward)         Start     Ordered   10/24/18 0000  Home infusion instructions [facility]  May follow [place] Dosing Protocol; May administer Cathflo as needed to maintain patency of vascular access device.; Flushing of vascular access device: per [facility] Protocol: 0.9% NaCl pre/post medica...    Question Answer Comment  Instructions May follow [place] Dosing Protocol   Instructions May administer Cathflo as needed to maintain patency of vascular access device.   Instructions Flushing of vascular access device: per [facility] Protocol: 0.9% NaCl pre/post medication administration and prn patency; Heparin 100 u/ml, 34m for implanted ports and Heparin 10u/ml, 529mfor all other central venous catheters.   Instructions May follow [facility] Anaphylaxis Protocol for First Dose Administration in the home: 0.9% NaCl at 25-50 ml/hr to maintain IV access for protocol meds. Epinephrine 0.3 ml IV/IM PRN and Benadryl  25-50 IV/IM PRN s/s of anaphylaxis.   Instructions [facility] Infusion Coordinator (RN) to assist per patient IV care needs in the home PRN.      10/24/18 1633          Time coordinating discharge: 45 minutes  The results of significant diagnostics from this hospitalization (including imaging, microbiology, ancillary and laboratory) are listed below for reference.    Procedures and Diagnostic Studies:   Ct Lumbar Spine W Contrast  Result Date: 10/11/2018 CLINICAL DATA:  MRSA bacteremia. Recent lumbar fusion. Elevated white count. EXAM: CT LUMBAR SPINE WITH CONTRAST TECHNIQUE: Multidetector CT imaging of the lumbar spine was performed with intravenous contrast administration. CONTRAST:  9m OMNIPAQUE IOHEXOL 300 MG/ML  SOLN COMPARISON:  Outside CT myelogram 06/26/2018. Outside CT postoperative 09/07/2018. FINDINGS: Segmentation: Standard. Alignment: Degenerative scoliosis convex RIGHT apex L3. Trace degenerative anterolisthesis L4-5. Adequate posterior decompression L2 through L5. Vertebrae: Sclerosis L3 through S1. Endplate irregularity above and below L3-4 and L5-S1 is  indeterminate. Endplate destruction above and below T12-L1, concerning for infection. Paraspinal and other soft tissues: No concerning postoperative fluid collections. Aortic atherosclerosis. No hydronephrosis. Disc levels: T12-L1: Widening of the disc space, with endplate irregularity, concerning for discitis and regional osteomyelitis. Further endplate destruction compared with May 2020 lumbar CT. Osseous spurring greater to the RIGHT, results in subarticular zone and foraminal zone narrowing which could affect the T12 and L1 nerve roots. No visible epidural enhancement. L1-L2: Disc space narrowing with osseous spurring. Vacuum phenomenon. Posterior osseous spurring. Mild stenosis. LEFT greater than RIGHT L1 and L2 neural impingement are possible. L2-L3: Spontaneous arthrodesis across and interspace with complete loss of disc height. No residual impingement post lumbar decompression. L3-L4: Endplate reactive changes, with widening of the interspace, mildly progressed from priors, could represent discitis. Loosening of the LEFT L3 screw. LEFT L4 screw extends into the L3-4 interspace. Adequate posterior decompression. No definite residual foraminal or subarticular zone narrowing. L4-L5: Severe to disc space narrowing similar to priors. Posterior osseous spurring. Hardware intact. No RIGHT L4 screw. Adequate posterior decompression but foraminal narrowing on the LEFT could affect the L4 nerve root. L5-S1: Mild widening of the disc space similar to priors. Posterior osseous spurring. Hardware intact. IMPRESSION: 1. Status post L2 through L5 posterior decompression. Superior migration of the LEFT L4 screw. Loosening of the LEFT L3 screw. 2. Further endplate destruction with widening of the interspace at T12-L1, concerning for discitis and regional osteomyelitis. 3. Similar widening of the disc space with endplate irregularity at L3-4 and L5-S1 is not clearly progressed from priors, but indolent infection is not  excluded. 4. It is unclear if the patient's pacemaker is MR compatible, but if so, MRI lumbar spine without and with contrast could provide additional information. Electronically Signed   By: JStaci RighterM.D.   On: 10/11/2018 08:53     Labs:   Basic Metabolic Panel: Recent Labs  Lab 10/19/18 0717 10/20/18 0531 10/23/18 0416 10/24/18 1011 10/25/18 0345  NA 138 137  --  134* 136  K 3.7 3.6  --  2.8* 3.3*  CL 100 98  --  91* 95*  CO2 27 31  --  32 31  GLUCOSE 132* 178*  --  288* 176*  BUN 12 15  --  25* 26*  CREATININE 0.85 0.78 0.72 0.73 0.68  CALCIUM 9.0 8.9  --  8.8* 8.9  MG 1.7 1.8  --  1.5* 1.5*   GFR Estimated Creatinine Clearance: 106.9 mL/min (by C-G formula based on  SCr of 0.68 mg/dL). Liver Function Tests: Recent Labs  Lab 10/19/18 0745  AST 15  ALT 7  ALKPHOS 99  BILITOT 0.6  PROT 5.7*  ALBUMIN 2.2*   No results for input(s): LIPASE, AMYLASE in the last 168 hours. No results for input(s): AMMONIA in the last 168 hours. Coagulation profile Recent Labs  Lab 10/20/18 1247  INR 1.2    CBC: Recent Labs  Lab 10/20/18 0531 10/24/18 1011  WBC 9.5 12.8*  NEUTROABS  --  10.7*  HGB 8.3* 8.8*  HCT 27.7* 29.7*  MCV 87.7 90.0  PLT 328 317   Cardiac Enzymes: No results for input(s): CKTOTAL, CKMB, CKMBINDEX, TROPONINI in the last 168 hours. BNP: Invalid input(s): POCBNP CBG: Recent Labs  Lab 10/24/18 0653 10/24/18 1109 10/24/18 1622 10/24/18 2059 10/25/18 0727  GLUCAP 199* 282* 220* 147* 165*   D-Dimer No results for input(s): DDIMER in the last 72 hours. Hgb A1c No results for input(s): HGBA1C in the last 72 hours. Lipid Profile No results for input(s): CHOL, HDL, LDLCALC, TRIG, CHOLHDL, LDLDIRECT in the last 72 hours. Thyroid function studies No results for input(s): TSH, T4TOTAL, T3FREE, THYROIDAB in the last 72 hours.  Invalid input(s): FREET3 Anemia work up No results for input(s): VITAMINB12, FOLATE, FERRITIN, TIBC, IRON, RETICCTPCT  in the last 72 hours. Microbiology Recent Results (from the past 240 hour(s))  Surgical PCR screen     Status: None   Collection Time: 10/16/18  8:33 AM   Specimen: Nasal Mucosa; Nasal Swab  Result Value Ref Range Status   MRSA, PCR NEGATIVE NEGATIVE Final   Staphylococcus aureus NEGATIVE NEGATIVE Final    Comment: (NOTE) The Xpert SA Assay (FDA approved for NASAL specimens in patients 30 years of age and older), is one component of a comprehensive surveillance program. It is not intended to diagnose infection nor to guide or monitor treatment. Performed at [facility], [place] [address] [place], [place] [address]   Aerobic/Anaerobic Culture (surgical/deep wound)     Status: None (Preliminary result)   Collection Time: 10/20/18  3:40 PM   Specimen: Pleura; Abscess  Result Value Ref Range Status   Specimen Description PLEURAL EMPYEMA  Final   Special Requests NONE  Final   Gram Stain   Final    MODERATE WBC PRESENT, PREDOMINANTLY PMN NO ORGANISMS SEEN Performed at [facility], [address], [place], [place] [address]    Culture   Final    RARE METHICILLIN RESISTANT STAPHYLOCOCCUS AUREUS NO ANAEROBES ISOLATED; CULTURE IN PROGRESS FOR 5 DAYS    Report Status PENDING  Incomplete   Organism ID, Bacteria METHICILLIN RESISTANT STAPHYLOCOCCUS AUREUS  Final      Susceptibility   Methicillin resistant staphylococcus aureus - MIC*    CIPROFLOXACIN >=8 RESISTANT Resistant     ERYTHROMYCIN >=8 RESISTANT Resistant     GENTAMICIN <=0.5 SENSITIVE Sensitive     OXACILLIN >=4 RESISTANT Resistant     TETRACYCLINE <=1 SENSITIVE Sensitive     VANCOMYCIN 1 SENSITIVE Sensitive     TRIMETH/SULFA 160 RESISTANT Resistant     CLINDAMYCIN RESISTANT Resistant     RIFAMPIN <=0.5 SENSITIVE Sensitive     Inducible Clindamycin POSITIVE Resistant     * RARE METHICILLIN RESISTANT STAPHYLOCOCCUS AUREUS  Acid Fast Smear (AFB)     Status: None   Collection Time: 10/20/18  3:40 PM   Specimen:  Abscess  Result Value Ref Range Status   AFB Specimen Processing Concentration  Final  Acid Fast Smear Negative  Final    Comment: (NOTE) Performed At: [facility] [place], [place] [id number] Rush Farmer MD [id number]    Source (AFB) PLEURAL  Final    Comment: EMPYEMA Performed at [facility], [place] [address], [place], [place] [address]   Fungus Culture With Stain     Status: None (Preliminary result)   Collection Time: 10/20/18  3:40 PM  Result Value Ref Range Status   Fungus Stain Final report  Final    Comment: (NOTE) Performed At: [facility] [place], [place] [id number] Rush Farmer MD [id number]    Fungus (Mycology) Culture PENDING  Incomplete   Fungal Source PLEURAL  Final    Comment: EMPYEMA Performed at [facility], [place] [address] [place], [place] [address]   Fungus Culture Result     Status: None   Collection Time: 10/20/18  3:40 PM  Result Value Ref Range Status   Result 1 Comment  Final    Comment: (NOTE) KOH/Calcofluor preparation:  no fungus observed. Performed At: [facility] [address] [place], [place] [id number] Rush Farmer MD YI:[id number]     Signed: Terrilee Croak  [facility] 10/25/2018, 7:53 AM

## 2018-10-24 NOTE — Progress Notes (Signed)
Asked to come and pull CT as per orders.  Patient with a small pigtail chest tube that does not have an air leak.  CT pulled as per orders without difficulty.  Dressing applied after pulling chest tube.

## 2018-10-24 NOTE — Progress Notes (Signed)
[facility]    Date of Admission:  10/10/2018      ID: [name] is a 63 y.o. male with complicated MRSA disseminated infection including HW spine, pacemaker now explanted, and right sided empyema s/p drainage Principal Problem:   MRSA bacteremia Active Problems:   Chronic obstructive lung disease (HCC)   Cauda equina syndrome (HCC)   Spinal stenosis of lumbar region   HTN (hypertension)   HLD (hyperlipidemia)   GERD (gastroesophageal reflux disease)   Chronic systolic CHF (congestive heart failure) (HCC)   Atrial fibrillation, chronic   CAD (coronary artery disease)   History of amputation of left great toe (HCC)   Anemia   Stool guaiac positive   Left foot infection-great toe   Diabetes mellitus type 2 with complications (HCC)   Pressure injury of skin   Cardiac device in situ   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 Looking forward to going home  Medications:  . acidophilus  1 capsule Oral Daily  . atorvastatin  40 mg Oral Daily  . ciprofloxacin  2 drop Both Eyes Q4H while awake  . enoxaparin (LOVENOX) injection  40 mg Subcutaneous Q24H  . feeding supplement (ENSURE ENLIVE)  237 mL Oral BID BM  . furosemide  40 mg Oral BID  . insulin aspart  0-9 Units Subcutaneous TID WC  . nicotine  14 mg Transdermal Daily  . nutrition supplement (JUVEN)  1 packet Oral BID BM  . pantoprazole  40 mg Oral BID  . polyvinyl alcohol  1 drop Both Eyes TID  . rifampin  300 mg Oral Q12H  . sodium chloride flush  10-40 mL Intracatheter Q12H  . sodium chloride flush  3 mL Intravenous Q12H    Objective: Vital signs in last 24 hours: Temp:  [97.9 F (36.6 C)-99.2 F (37.3 C)] 99.2 F (37.3 C) (06/23 2103) Pulse Rate:  [58-111] 58 (06/23 2103) Resp:  [16-20] 20 (06/23 2103) BP: (108-143)/(62-92) 132/62 (06/23 2103) SpO2:  [97 %-100 %] 98 % (06/23 2103) Physical  Exam  Constitutional: He is oriented to person, place, and time. He appearschronically ill. No distress.  HENT:  Mouth/Throat: Oropharynx is clear and moist. No oropharyngeal exudate.  Cardiovascular: Normal rate, regular rhythm and normal heart sounds. Exam reveals no gallop and no friction rub.  No murmur heard.  Pulmonary/Chest: Effort normal and breath sounds normal. No respiratory distress. He has no wheezes. Bandaged to right Abdominal: Soft. Bowel sounds are normal. He exhibits no distension. There is no tenderness.  Lymphadenopathy:  He has no cervical adenopathy.  Neurological: He is alert and oriented to person, place, and time.  Skin: Skin is warm and dry. No rash noted. No erythema.  Psychiatric: He has a normal mood and affect. His behavior is normal.    Lab Results Recent Labs    10/23/18 0416 10/24/18 1011  WBC  --  12.8*  HGB  --  8.8*  HCT  --  29.7*  NA  --  134*  K  --  2.8*  CL  --  91*  CO2  --  32  BUN  --  25*  CREATININE 0.72 0.73   Lab Results  Component Value Date   ESRSEDRATE 50 (H) 10/12/2018    Microbiology: 6/19 pleural cx= MRSA Studies/Results: Dg Chest Port 1 View  Result Date: 10/23/2018 CLINICAL DATA:  Chest tube, empyema, hypertension,  atrial fibrillation, diabetes mellitus, CHF, COPD, smoker, CHF, coronary artery disease EXAM: PORTABLE CHEST 1 VIEW COMPARISON:  Portable exam 1643 hours compared to 10/20/2018 FINDINGS: Pigtail thoracostomy tube identified at inferior RIGHT hemithorax. RIGHT arm PICC line tip projects over SVC. Normal heart size post CABG. Mediastinal contours and pulmonary vascularity normal. RIGHT basilar atelectasis. Remaining lungs clear. No pneumothorax. IMPRESSION: RIGHT basilar atelectasis. No pneumothorax following RIGHT thoracostomy tube placement. Electronically Signed   By: Lavonia Dana M.D.   On: 10/23/2018 17:01     Assessment/Plan: mrsa empyema with HW infection/discitis = plan to treat for minimum of 2 wk  for empyema but will need to extend for 8wks for discitis/HW infection. Will likely restart abtx clock when patient goes to surgery with dr cram to remove infected hw.  Continue with vancomycin plus rifampin 300mg  bid For discharge labs will need twice a week cr to check kidney function and weekly vanco trough and cmp for liver toxicity associated with rifampin  Will see back in the clinic in 2wk  [facility] Cell: [phone] Pager: [phone]  10/24/2018, 10:58 PM

## 2018-10-24 NOTE — Progress Notes (Signed)
Physical Therapy Treatment Patient Details Name: Henry Mcpherson MRN: [id number] DOB: 1956/01/15 Today's Date: 10/24/2018    History of Present Illness 63 y.o. male with medical history significant of sCHF with EF 45%, A. fib, tachybradycardia syndrome s/p PM, COPD, DM type II, PUD, lumbar stenosis, and cauda equina syndrome, s/p lumbar decompression and fusion by Dr. Wynetta Emery on 3/4; s/p left great toe amputation due to infection, who is transferred from [facility] due to back pain, bilateral leg weakness, foot infection, MRSA bacteremia.    PT Comments     Patient requiring mod-max A to come to sitting today, painful in sitting. Discussed role of HHPT while he awaits follow up with surgery plans as an outpatient. Pt agreeable. Updating recs to HHPT.     Follow Up Recommendations  HHPT      Equipment Recommendations  (TBD)    Recommendations for Other Services Rehab consult     Precautions / Restrictions Precautions Precautions: Fall Precaution Comments: Due to difficulty walking and BLE weakness Restrictions Weight Bearing Restrictions: No    Mobility  Bed Mobility Overal bed mobility: Needs Assistance Bed Mobility: Rolling;Supine to Sit;Sit to Supine Rolling: Mod assist   Supine to sit: Max assist Sit to supine: Max assist   General bed mobility comments: Max to bring up , able to pull up with UE  Transfers Overall transfer level: Needs assistance               General transfer comment: deferred due to safety   Ambulation/Gait                 Stairs             Wheelchair Mobility    Modified Rankin (Stroke Patients Only)       Balance Overall balance assessment: Needs assistance   Sitting balance-Leahy Scale: Fair       Standing balance-Leahy Scale: Poor                              Cognition Arousal/Alertness: Awake/alert Behavior During Therapy: WFL for tasks assessed/performed Overall Cognitive  Status: Within Functional Limits for tasks assessed                                        Exercises      General Comments        Pertinent Vitals/Pain Pain Assessment: Faces Pain Location: 7/10 back pain Pain Descriptors / Indicators: Aching    Home Living                      Prior Function            PT Goals (current goals can now be found in the care plan section) Acute Rehab PT Goals Patient Stated Goal: get stronger PT Goal Formulation: With patient Time For Goal Achievement: 11/01/18 Potential to Achieve Goals: Fair Progress towards PT goals: Progressing toward goals    Frequency    Min 3X/week      PT Plan      Co-evaluation              AM-PAC PT "6 Clicks" Mobility   Outcome Measure  Help needed turning from your back to your side while in a flat bed without using bedrails?: A Lot Help needed moving from lying  on your back to sitting on the side of a flat bed without using bedrails?: A Lot Help needed moving to and from a bed to a chair (including a wheelchair)?: Total Help needed standing up from a chair using your arms (e.g., wheelchair or bedside chair)?: Total Help needed to walk in hospital room?: Total Help needed climbing 3-5 steps with a railing? : Total 6 Click Score: 8    End of Session Equipment Utilized During Treatment: Gait belt Activity Tolerance: Patient tolerated treatment well Patient left: in bed Nurse Communication: Mobility status PT Visit Diagnosis: Unsteadiness on feet (R26.81)     Time: [id number] PT Time Calculation (min) (ACUTE ONLY): 15 min  Charges:  $Therapeutic Activity: 8-22 mins                     Reinaldo Berber, PT, DPT Acute Rehabilitation Services Pager: [phone] Office: [phone]     Reinaldo Berber 10/24/2018, 3:39 PM

## 2018-10-24 NOTE — Progress Notes (Signed)
Referring Physician(s): Dr. Servando Snare  Supervising Physician: Sandi Mariscal  Patient Status:  [facility] - In-pt  Chief Complaint: Follow up right chest tube placed 10/20/18 by Dr. Anselm Pancoast  Subjective:  Patient reports he feels good, has no complaints other than wanting to go home. Appetite is good, no cough or shortness of breath.  Allergies: Warfarin and related  Medications: Prior to Admission medications   Medication Sig Start Date End Date Taking? Authorizing Provider  acetaminophen (TYLENOL) 325 MG tablet Take 2 tablets (650 mg total) by mouth every 4 (four) hours as needed for mild pain ((score 1 to 3) or temp > 100.5). 07/12/18  Yes Masoudi, Elhamalsadat, MD  albuterol (PROVENTIL HFA;VENTOLIN HFA) 108 (90 Base) MCG/ACT inhaler Inhale 1-2 puffs into the lungs every 6 (six) hours as needed for wheezing or shortness of breath.   Yes [provider]  atorvastatin (LIPITOR) 40 MG tablet Take 40 mg by mouth daily.   Yes [provider]  [employer] VITAMIN B12 1000 MCG tablet Take 1,000 mcg by mouth daily. 09/05/18  Yes [provider]  cyclobenzaprine (FLEXERIL) 10 MG tablet Take 1 tablet (10 mg total) by mouth 2 (two) times daily as needed for muscle spasms. 07/12/18  Yes Masoudi, Elhamalsadat, MD  digoxin (LANOXIN) 0.125 MG tablet Take 0.125 mg by mouth daily. 05/04/18  Yes [provider]  furosemide (LASIX) 40 MG tablet Take 40 mg by mouth daily.   Yes [provider]  LEVEMIR FLEXTOUCH 100 UNIT/ML Pen Inject 15-20 Units into the skin every evening. 09/05/18  Yes [provider]  metolazone (ZAROXOLYN) 2.5 MG tablet Take 2.5 mg by mouth daily as needed for fluid. 07/05/18  Yes [provider]  metoprolol succinate (TOPROL-XL) 100 MG 24 hr tablet Take 100 mg by mouth 2 (two) times daily. 06/16/18  Yes [provider]  nicotine (NICODERM CQ - DOSED IN MG/24 HOURS) 14 mg/24hr patch Place 1 patch (14 mg total) onto the skin daily. 07/13/18   Yes Masoudi, Elhamalsadat, MD  pantoprazole (PROTONIX) 40 MG tablet Take 40 mg by mouth 2 (two) times daily. 05/26/18  Yes [provider]  Probiotic Product (PROBIOTIC DAILY PO) Take 1 capsule by mouth daily.   Yes [provider]  senna-docusate (SENOKOT-S) 8.6-50 MG tablet Take 1 tablet by mouth 2 (two) times daily as needed for mild constipation. 07/12/18  Yes Masoudi, Elhamalsadat, MD  sucralfate (CARAFATE) 1 g tablet Take 1 g by mouth 4 (four) times daily. 09/05/18  Yes [provider]  traMADol (ULTRAM) 50 MG tablet Take 50 mg by mouth every 6 (six) hours as needed for pain. 09/14/18  Yes [provider]  traZODone (DESYREL) 50 MG tablet Take 50 mg by mouth at bedtime as needed for sleep. 10/06/18  Yes [provider]     Vital Signs: BP 128/77 (BP Location: Left Arm)    Pulse (!) 58    Temp 98.3 F (36.8 C) (Oral)    Resp 18    Ht 6\' 2"  (1.88 m)    Wt 183 lb 6.8 oz (83.2 kg)    SpO2 97%    BMI 23.55 kg/m   Physical Exam Vitals signs and nursing note reviewed.  Constitutional:      General: He is not in acute distress. HENT:     Head: Normocephalic.  Cardiovascular:     Rate and Rhythm: Normal rate.  Pulmonary:     Effort: Pulmonary effort is normal.     Breath  sounds: Normal breath sounds.     Comments: (+) Right posterior pigtail chest tube to suction - 80 cc serosanguineous OP in pleurvac. Insertion site clean, dry, dressed appropriately. No discharge or bleeding noted. (-) crepitus or pain on palpation. Skin:    General: Skin is warm and dry.  Neurological:     Mental Status: He is alert and oriented to person, place, and time.     Imaging: Dg Chest Port 1 View  Result Date: 10/23/2018 CLINICAL DATA:  Chest tube, empyema, hypertension, atrial fibrillation, diabetes mellitus, CHF, COPD, smoker, CHF, coronary artery disease EXAM: PORTABLE CHEST 1 VIEW COMPARISON:  Portable exam 1643 hours compared to 10/20/2018 FINDINGS: Pigtail  thoracostomy tube identified at inferior RIGHT hemithorax. RIGHT arm PICC line tip projects over SVC. Normal heart size post CABG. Mediastinal contours and pulmonary vascularity normal. RIGHT basilar atelectasis. Remaining lungs clear. No pneumothorax. IMPRESSION: RIGHT basilar atelectasis. No pneumothorax following RIGHT thoracostomy tube placement. Electronically Signed   By: Ulyses Southward M.D.   On: 10/23/2018 17:01   Dg Chest Port 1 View  Result Date: 10/20/2018 CLINICAL DATA:  Status post right PICC placement. EXAM: PORTABLE CHEST 1 VIEW COMPARISON:  10/17/2018 and chest CT dated 10/19/2018. FINDINGS: Again demonstrated is a poor inspiration with stable mild enlargement of the cardiac silhouette. Post CABG changes are again demonstrated. Interval right PICC with its tip in the inferior aspect of the superior vena cava, proximally 1 cm above the superior cavoatrial junction. Mild patchy density at the right lung base with improvement. Diffuse osteopenia. IMPRESSION: 1. Right PICC tip in the inferior aspect of the superior vena cava, 1 cm above the superior cavoatrial junction. 2. Mild patchy atelectasis or pneumonia at the right lung base with improvement. 3. Stable mild cardiomegaly. Electronically Signed   By: Beckie Salts M.D.   On: 10/20/2018 10:14   Ct Perc Pleural Drain W/indwell Cath W/img Guide  Result Date: 10/20/2018 INDICATION: 63 year old with right chest empyema. Plan for CT-guided aspiration or drain placement. EXAM: CT-GUIDED PLACEMENT OF RIGHT CHEST DRAIN MEDICATIONS: No antibiotics were given for the procedure. ANESTHESIA/SEDATION: Fentanyl 50 mcg The patient was continuously monitored during the procedure by the interventional radiology nurse under my direct supervision. COMPLICATIONS: None immediate. PROCEDURE: Informed written consent was obtained from the patient after a thorough discussion of the procedural risks, benefits and alternatives. All questions were addressed. Maximal  Sterile Barrier Technique was utilized including caps, mask, sterile gowns, sterile gloves, sterile drape, hand hygiene and skin antiseptic. A timeout was performed prior to the initiation of the procedure. Patient was placed on his right side. CT images through the chest were obtained. The complex fluid collection along the posterior right chest was targeted. The right side of the back was prepped with chlorhexidine and sterile field was created. Skin and soft tissues anesthetized with 1% lidocaine. Yueh catheter was directed into this collection with CT guidance. Thick brown fluid was aspirated. Due to the appearance of the fluid, decided the patient would need a percutaneous drain. Stiff Amplatz wire was advanced into the collection and the tract was dilated to accommodate a 12 [place] multipurpose drain. Approximately 30 mL of thick brown fluid was aspirated. Catheter was sutured to skin and attached to PleurEvac chest drainage system. Despite flushing the drain, there was minimal output after the initial 30 mL. Dressing was placed over the drain. FINDINGS: Thick-walled complex fluid collection along the posterior right chest. 30 mL of thick brown fluid was removed and findings are suggestive for  a chest empyema. Minimal output from the drain after the initial 30 mL. IMPRESSION: CT-guided drain placement within the posterior right chest empyema. Electronically Signed   By: Richarda OverlieAdam  Henn M.D.   On: 10/20/2018 18:27   Hybrid Or Imaging (mc Only)  Result Date: 10/20/2018 There is no interpretation for this exam.  This order is for images obtained during a surgical procedure.  Please See "Surgeries" Tab for more information regarding the procedure.    Labs:  CBC: Recent Labs    10/16/18 0931 10/16/18 2020 10/18/18 0629 10/20/18 0531  WBC 12.0* 13.3* 9.6 9.5  HGB 9.3* 9.0* 8.6* 8.3*  HCT 31.7* 30.5* 29.1* 27.7*  PLT 394 369 340 328    COAGS: Recent Labs    06/29/18 1459 06/30/18 0316  10/11/18 0611 10/20/18 1247  INR 1.0 1.1 1.2 1.2  APTT 32  --  35  --     BMP: Recent Labs    10/17/18 0654 10/18/18 0629 10/19/18 0717 10/20/18 0531 10/23/18 0416  NA 137 137 138 137  --   K 3.7 3.6 3.7 3.6  --   CL 104 102 100 98  --   CO2 25 26 27 31   --   GLUCOSE 177* 185* 132* 178*  --   BUN 13 14 12 15   --   CALCIUM 8.8* 8.9 9.0 8.9  --   CREATININE 0.75 0.83 0.85 0.78 0.72  GFRNONAA >60 >60 >60 >60 >60  GFRAA >60 >60 >60 >60 >60    LIVER FUNCTION TESTS: Recent Labs    07/11/18 0712 07/12/18 0616 07/13/18 0407 10/16/18 2020 10/19/18 0745  BILITOT 0.5 0.6 0.5  --  0.6  AST 70* 40 31  --  15  ALT 82* 63* 50*  --  7  ALKPHOS 94 90 91  --  99  PROT 4.5* 4.6* 4.9*  --  5.7*  ALBUMIN 1.7* 1.7* 1.7* 2.1* 2.2*    Assessment and Plan:  63 y/o M s/p right chest tube placement 10/20/18 for empyema - 80 cc in pleurvac today, per I/O 25 cc in last 24H since wall suction replaced. Discussed patient with Dr. Grace IsaacWatts today who recommends to continue suction until OP <10 cc in 24H, once OP is <10 cc will discuss repeat imaging vs removal.  IR will continue to follow along with [facility]. Please call with any questions or concerns.  Electronically Signed: Villa HerbShannon A Dustyn Dansereau, PA-C 10/24/2018, 9:33 AM   I spent a total of 15 Minutes at the the patient's bedside AND on the patient's hospital floor or unit, greater than 50% of which was counseling/coordinating care for right chest tube follow up.

## 2018-10-25 LAB — BASIC METABOLIC PANEL
Anion gap: 10 (ref 5–15)
BUN: 26 mg/dL — ABNORMAL HIGH (ref 8–23)
CO2: 31 mmol/L (ref 22–32)
Calcium: 8.9 mg/dL (ref 8.9–10.3)
Chloride: 95 mmol/L — ABNORMAL LOW (ref 98–111)
Creatinine, Ser: 0.68 mg/dL (ref 0.61–1.24)
GFR calc Af Amer: >60 mL/min (ref >60)
GFR calc non Af Amer: >60 mL/min (ref >60)
Glucose, Bld: 176 mg/dL — ABNORMAL HIGH (ref 70–99)
Potassium: 3.3 mmol/L — ABNORMAL LOW (ref 3.5–5.1)
Sodium: 136 mmol/L (ref 135–145)

## 2018-10-25 LAB — AEROBIC/ANAEROBIC CULTURE W GRAM STAIN (SURGICAL/DEEP WOUND)

## 2018-10-25 LAB — GLUCOSE, CAPILLARY: Glucose-Capillary: 165 mg/dL — ABNORMAL HIGH (ref 70–99)

## 2018-10-25 LAB — MAGNESIUM: Magnesium: 1.5 mg/dL — ABNORMAL LOW (ref 1.7–2.4)

## 2018-10-25 MED ORDER — HEPARIN SOD (PORK) LOCK FLUSH 100 UNIT/ML IV SOLN
250.0000 [IU] | INTRAVENOUS | Status: AC | PRN
  Administered 2018-10-25: 250 [IU]

## 2018-10-25 MED ORDER — MAGNESIUM OXIDE 400 (241.3 MG) MG PO TABS
400.0000 mg | ORAL_TABLET | Freq: Once | ORAL | Status: AC
  Administered 2018-10-25: 400 mg via ORAL
  Filled 2018-10-25: qty 1

## 2018-10-25 MED ORDER — POTASSIUM CHLORIDE CRYS ER 20 MEQ PO TBCR
40.0000 meq | EXTENDED_RELEASE_TABLET | Freq: Once | ORAL | Status: AC
  Administered 2018-10-25: 40 meq via ORAL
  Filled 2018-10-25: qty 2

## 2018-10-25 NOTE — Progress Notes (Signed)
Henry Mcpherson to be discharged Home per MD order. Discussed prescriptions and follow up appointments with the patient. Prescriptions explained to patient; medication list explained in detail. Patient verbalized understanding.  Skin clean, dry and intact without evidence of skin break down, no evidence of skin tears noted. Patient left with PICC line for long term antibiotics. No complaints noted.  An After Visit Summary (AVS) was printed and given to the patient. Patient escorted via wheelchair, and discharged home via private auto.  Amaryllis Dyke, RN

## 2018-10-25 NOTE — Progress Notes (Signed)
Patient was all set for discharge to home yesterday. Could not go home because transportation was not available until late last night. Scheduled for pickup at 11 AM today. I saw the patient this morning.  No change in last 24 hours. Continue with the plan of discharge home today.

## 2018-10-25 NOTE — Plan of Care (Signed)
  Problem: Health Behavior/Discharge Planning: Goal: Ability to manage health-related needs will improve 10/25/2018 0421 by Karl Bales, RN Outcome: Adequate for Discharge 10/25/2018 0421 by Karl Bales, RN Outcome: Adequate for Discharge   Problem: Clinical Measurements: Goal: Ability to maintain clinical measurements within normal limits will improve Outcome: Adequate for Discharge Goal: Will remain free from infection Outcome: Adequate for Discharge Goal: Respiratory complications will improve Outcome: Adequate for Discharge Goal: Cardiovascular complication will be avoided Outcome: Adequate for Discharge   Problem: Activity: Goal: Risk for activity intolerance will decrease Outcome: Adequate for Discharge   Problem: Nutrition: Goal: Adequate nutrition will be maintained Outcome: Adequate for Discharge   Problem: Coping: Goal: Level of anxiety will decrease Outcome: Adequate for Discharge   Problem: Elimination: Goal: Will not experience complications related to bowel motility Outcome: Adequate for Discharge Goal: Will not experience complications related to urinary retention Outcome: Adequate for Discharge   Problem: Pain Managment: Goal: General experience of comfort will improve Outcome: Adequate for Discharge   Problem: Safety: Goal: Ability to remain free from injury will improve Outcome: Adequate for Discharge   Problem: Skin Integrity: Goal: Risk for impaired skin integrity will decrease Outcome: Adequate for Discharge

## 2018-10-25 NOTE — TOC Transition Note (Signed)
Transition of Care [facility]) - CM/SW Discharge Note   Patient Details  Name: Henry Mcpherson MRN: [id number] Date of Birth: [birth year]  Transition of [facility]) CM/SW Contact:  Bartholomew Crews, RN Phone Number: [phone] 10/25/2018, 8:26 AM   Clinical Narrative:    Patient to transition home today. [facility] scheduled to pick up patient at 11am - called this AM to confirm. Spoke with [name] to confirm faxes received - contact number provided. [facility] to meet [name] at patient home 3pm for today's dose of IV vancomycin. No other transition of care needs identiified.    Final next level of care: Home w Home Health Services Barriers to Discharge: No Barriers Identified   Patient Goals and CMS Choice   CMS Medicare.gov Compare Post Acute Care list provided to:: Patient Choice offered to / list presented to : Patient  Discharge Placement                       Discharge Plan and Services In-house Referral: NA Discharge Planning Services: CM Consult Post Acute Care Choice: Home Health          DME Arranged: N/A DME Agency: NA       HH Arranged: RN, PT [place] Agency: Other - See comment([facility])        Social Determinants of Health (SDOH) Interventions     Readmission Risk Interventions No flowsheet data found.

## 2018-10-25 NOTE — Progress Notes (Signed)
Pharmacy Antibiotic Note  Henry Mcpherson is a 63 y.o. male admitted on 10/10/2018 with MRSA bacteremia.  Pharmacy consulted for vancomycin dosing.  Continues on IV Vancomycin 750 mg IV every 24 hours for disseminated MRSA infection/HW spinal infection. (Hx of fusion surgery in March 2020).   Vancomycin current dose: calculated AUC is 463. Goal AUC 400-550. Renal function is stable with SCr 0.68 and estimated CrCl 107 ml/ min.    S/p amputation of toe on 6/2.TEE w/ vege vs thrombus on lead. ID on board -PPM extraction 6/15, avoiding re-implant as long as possible.  S/p aspiration of locultated Rt pleural fluid & drain placed by IR 6/19 , culture sent growing Staph aureus , pending  Afeb, WBC WNL ID noted will need prolonged course of 6-8 weeks with reassessment  Patient with likely discharge today.   Vanc 6/9>> Rifampin po 6/10>>  AUC measured and Vanc dose adjustments:  6/12 AUC 489 6/18 AUC 618 >on 1g q24h > Lower dose to 750 mg every 24 hours   OSH: MRSA bacteremia 6/1 BCx from OSH: NGF 6/9 BCx: neg 6/11 Wound cx > neg 6/15 MRSA PCR neg 6/10 COVID neg 6/10 surgical PCR: neg 6/19 pleural empyema wound cx: staph aureus pending   Plan: Continue Vancomycin 750 mg IV every 24 hours. New calculated AUC is 463.9. Goal AUC 400-550. Continue to monitor renal function, cultures and vanc levels. Vanc levels on 6/25 if patient stilll here.   Height: 6\' 2"  (188 cm) Weight: 181 lb (82.1 kg) IBW/kg (Calculated) : 82.2  Temp (24hrs), Avg:98.6 F (37 C), Min:98.2 F (36.8 C), Max:99.2 F (37.3 C)  Recent Labs  Lab 10/19/18 0003 10/19/18 0717 10/20/18 0531 10/23/18 0416 10/24/18 1011 10/25/18 0345  WBC  --   --  9.5  --  12.8*  --   CREATININE  --  0.85 0.78 0.72 0.73 0.68  VANCORANDOM 37 28  --   --   --   --     Estimated Creatinine Clearance: 106.9 mL/min (by C-G formula based on SCr of 0.68 mg/dL).    Allergies  Allergen Reactions  . Warfarin And Related Itching  and Rash    Henry Mcpherson A. Levada Dy, PharmD, [place] Please utilize [facility] for appropriate phone number to reach the unit pharmacist ([place])   10/25/2018 8:21 AM

## 2018-10-27 NOTE — Progress Notes (Addendum)
10/27/18 10:53 AM  Telephone call to spouse in response to vm left yesterday. Patient was discharged home Wednesday morning. Prescription of novolog sliding scale not sent to pharmacy. Asked about patient blood sugar this morning. It was greater than 200 however patient had admitted to forgetting levemir last night. Advised to monitor and log blood sugar and insulin administration and to contact PCP for f/u appointment and to advise of blood sugar and missed dose. Spouse verbalized understanding.   Manya Silvas, RN MSN CCM Transitions of Care [phone]

## 2018-10-31 ENCOUNTER — Ambulatory Visit: Payer: BC Managed Care – PPO

## 2018-11-02 LAB — CULTURE, FUNGUS WITHOUT SMEAR

## 2018-11-08 ENCOUNTER — Other Ambulatory Visit: Payer: Self-pay

## 2018-11-08 ENCOUNTER — Ambulatory Visit (INDEPENDENT_AMBULATORY_CARE_PROVIDER_SITE_OTHER): Payer: BC Managed Care – PPO | Admitting: Internal Medicine

## 2018-11-08 ENCOUNTER — Encounter: Payer: Self-pay | Admitting: Internal Medicine

## 2018-11-08 DIAGNOSIS — R7881 Bacteremia: Secondary | ICD-10-CM | POA: Diagnosis not present

## 2018-11-08 DIAGNOSIS — M4645 Discitis, unspecified, thoracolumbar region: Secondary | ICD-10-CM | POA: Diagnosis not present

## 2018-11-08 NOTE — Progress Notes (Signed)
RFV: follow up for mrsa disseminated infection/HW and empyema. televisit  Patient ID: Henry Mcpherson, male   DOB: August 18, 1955, 63 y.o.   MRN: [id number]  HPI Henry Mcpherson is a 63 y.o. male with complicated MRSA disseminated infection including HW spine, pacemaker now explanted, and right sided empyema s/p drainage. Discharged on 6/23 with 8 wk of iv vanco, plus rifampin.   Notice loose stools but usual 2 episodes sometimes 3.no abdominal cramping. No n/v    Outpatient Encounter Medications as of 11/08/2018  Medication Sig   acetaminophen (TYLENOL) 325 MG tablet Take 2 tablets (650 mg total) by mouth every 4 (four) hours as needed for mild pain ((score 1 to 3) or temp > 100.5).   albuterol (PROVENTIL HFA;VENTOLIN HFA) 108 (90 Base) MCG/ACT inhaler Inhale 1-2 puffs into the lungs every 6 (six) hours as needed for wheezing or shortness of breath.   atorvastatin (LIPITOR) 40 MG tablet Take 40 mg by mouth daily.   [employer] VITAMIN B12 1000 MCG tablet Take 1,000 mcg by mouth daily.   cyclobenzaprine (FLEXERIL) 10 MG tablet Take 1 tablet (10 mg total) by mouth 2 (two) times daily as needed for muscle spasms.   furosemide (LASIX) 40 MG tablet Take 1 tablet (40 mg total) by mouth 2 (two) times daily for 30 days.   LEVEMIR FLEXTOUCH 100 UNIT/ML Pen Inject 5 Units into the skin every evening.   pantoprazole (PROTONIX) 40 MG tablet Take 40 mg by mouth 2 (two) times daily.   potassium chloride SA (K-DUR) 20 MEQ tablet Take 1 tablet (20 mEq total) by mouth daily for 30 days.   Probiotic Product (PROBIOTIC DAILY PO) Take 1 capsule by mouth daily.   rifampin (RIFADIN) 300 MG capsule Take 1 capsule (300 mg total) by mouth every 12 (twelve) hours.   sucralfate (CARAFATE) 1 g tablet Take 1 g by mouth 4 (four) times daily.   Vancomycin (VANCOCIN) 750-5 MG/150ML-% SOLN Inject 150 mLs (750 mg total) into the vein daily for 30 days.   vancomycin IVPB Inject 750 mg into the vein daily for 28  days. Indication: Disseminated MRSA Infection  Last Day of Therapy:  28 days  Labs - Sunday/Monday:  CBC/D, BMP, and vancomycin trough. Labs - Thursday:  BMP and vancomycin trough Labs - Every other week:  ESR and CRP   insulin aspart (NOVOLOG) 100 UNIT/ML injection Inject 0-9 Units into the skin 3 (three) times daily with meals. (Patient not taking: Reported on 11/08/2018)   senna-docusate (SENOKOT-S) 8.6-50 MG tablet Take 1 tablet by mouth 2 (two) times daily as needed for mild constipation. (Patient not taking: Reported on 11/08/2018)   No facility-administered encounter medications on file as of 11/08/2018.      Patient Active Problem List   Diagnosis Date Noted   Left foot infection-great toe 10/11/2018   Diabetes mellitus type 2 with complications ([place]) 00/86/7619   Pressure injury of skin 10/11/2018   Cardiac device in situ 10/11/2018   Discitis of thoracolumbar region complicated by hardware in situ  10/11/2018   MRSA bacteremia 10/10/2018   HTN (hypertension) 10/10/2018   HLD (hyperlipidemia) 10/10/2018   GERD (gastroesophageal reflux disease) 50/93/2671   Chronic systolic CHF (congestive heart failure) ([place]) 10/10/2018   Atrial fibrillation, chronic 10/10/2018   CAD (coronary artery disease) 10/10/2018   History of amputation of left great toe ([place]) 10/10/2018   Anemia 10/10/2018   Stool guaiac positive 10/10/2018   Malnutrition of moderate degree 07/08/2018   Spinal stenosis at  L4-L5 level 07/05/2018   Cauda equina syndrome ([place]) 07/03/2018   Spinal stenosis of lumbar region 07/03/2018   Community acquired pneumonia 06/29/2018   Controlled type 2 diabetes mellitus without complication, without long-term current use of insulin ([place]) 06/26/2018   Chronic obstructive lung disease ([place]) 06/24/2017   Diabetic ulcer of right midfoot associated with type 2 diabetes mellitus, limited to breakdown of skin ([place]) 03/08/2016   Foot drop, right 03/08/2016      Health Maintenance Due  Topic Date Due   HEMOGLOBIN A1C  1956-03-22   Hepatitis C Screening  1956-02-07   FOOT EXAM  10/01/1963   OPHTHALMOLOGY EXAM  10/01/1963   URINE MICROALBUMIN  10/01/1963   TETANUS/TDAP  09/30/1972   COLONOSCOPY  10/01/2003   PNA vac Low Risk Adult (1 of 2 - PCV13) 10/01/2018     Review of Systems 12 point of ros is otherwise negative. Noticed non itching rash to right axilla Physical Exam   Physical Exam  Constitutional: He is oriented to person, place, and time. He appears well-developed and well-nourished. No distress.  HENT:  Mouth/Throat: Oropharynx is clear and moist. No oropharyngeal exudate.  Cardiovascular: Normal rate, regular rhythm and normal heart sounds. Exam reveals no gallop and no friction rub.  No murmur heard.  Ext:  Neurological: He is alert and oriented to person, place, and time.  Skin: Skin is warm and dry. No rash noted. No erythema.  Psychiatric: He has a normal mood and affect. His behavior is normal.    CBC Lab Results  Component Value Date   WBC 12.8 (H) 10/24/2018   RBC 3.30 (L) 10/24/2018   HGB 8.8 (L) 10/24/2018   HCT 29.7 (L) 10/24/2018   PLT 317 10/24/2018   MCV 90.0 10/24/2018   MCH 26.7 10/24/2018   MCHC 29.6 (L) 10/24/2018   RDW 20.0 (H) 10/24/2018   LYMPHSABS 0.9 10/24/2018   MONOABS 0.8 10/24/2018   EOSABS 0.2 10/24/2018    BMET Lab Results  Component Value Date   NA 136 10/25/2018   K 3.3 (L) 10/25/2018   CL 95 (L) 10/25/2018   CO2 31 10/25/2018   GLUCOSE 176 (H) 10/25/2018   BUN 26 (H) 10/25/2018   CREATININE 0.68 10/25/2018   CALCIUM 8.9 10/25/2018   GFRNONAA >60 10/25/2018   GFRAA >60 10/25/2018      Assessment and Plan mrsa disseminated infection = plan to continue with vancomycin plus rif. To be through 8/20.  Lab monitoring = Getting labs on Monday-thur for vanco trough and kidney function  abtx associated diarrhea = can use immodium if needed  rtc in 3-4 wk

## 2018-11-15 ENCOUNTER — Other Ambulatory Visit: Payer: Self-pay

## 2018-11-15 ENCOUNTER — Inpatient Hospital Stay (HOSPITAL_COMMUNITY)
Admission: AD | Admit: 2018-11-15 | Discharge: 2018-11-21 | DRG: 286 | Disposition: A | Discharge status: Home Health | Payer: Medicare Other | Source: Other Acute Inpatient Hospital | Attending: Internal Medicine | Admitting: Internal Medicine

## 2018-11-15 ENCOUNTER — Telehealth: Payer: Self-pay | Admitting: *Deleted

## 2018-11-15 ENCOUNTER — Encounter (HOSPITAL_COMMUNITY): Payer: Self-pay | Admitting: General Practice

## 2018-11-15 DIAGNOSIS — I4510 Unspecified right bundle-branch block: Secondary | ICD-10-CM | POA: Diagnosis present

## 2018-11-15 DIAGNOSIS — I495 Sick sinus syndrome: Secondary | ICD-10-CM | POA: Diagnosis present

## 2018-11-15 DIAGNOSIS — M4625 Osteomyelitis of vertebra, thoracolumbar region: Secondary | ICD-10-CM | POA: Diagnosis present

## 2018-11-15 DIAGNOSIS — I5022 Chronic systolic (congestive) heart failure: Secondary | ICD-10-CM | POA: Diagnosis present

## 2018-11-15 DIAGNOSIS — I110 Hypertensive heart disease with heart failure: Secondary | ICD-10-CM | POA: Diagnosis present

## 2018-11-15 DIAGNOSIS — B9562 Methicillin resistant Staphylococcus aureus infection as the cause of diseases classified elsewhere: Secondary | ICD-10-CM | POA: Diagnosis present

## 2018-11-15 DIAGNOSIS — I4891 Unspecified atrial fibrillation: Secondary | ICD-10-CM | POA: Diagnosis not present

## 2018-11-15 DIAGNOSIS — M4645 Discitis, unspecified, thoracolumbar region: Secondary | ICD-10-CM | POA: Diagnosis present

## 2018-11-15 DIAGNOSIS — G834 Cauda equina syndrome: Secondary | ICD-10-CM | POA: Diagnosis not present

## 2018-11-15 DIAGNOSIS — I340 Nonrheumatic mitral (valve) insufficiency: Secondary | ICD-10-CM | POA: Diagnosis not present

## 2018-11-15 DIAGNOSIS — Z8711 Personal history of peptic ulcer disease: Secondary | ICD-10-CM

## 2018-11-15 DIAGNOSIS — E876 Hypokalemia: Secondary | ICD-10-CM | POA: Diagnosis not present

## 2018-11-15 DIAGNOSIS — M48061 Spinal stenosis, lumbar region without neurogenic claudication: Secondary | ICD-10-CM | POA: Diagnosis present

## 2018-11-15 DIAGNOSIS — F17210 Nicotine dependence, cigarettes, uncomplicated: Secondary | ICD-10-CM | POA: Diagnosis present

## 2018-11-15 DIAGNOSIS — M4627 Osteomyelitis of vertebra, lumbosacral region: Secondary | ICD-10-CM | POA: Diagnosis not present

## 2018-11-15 DIAGNOSIS — I428 Other cardiomyopathies: Secondary | ICD-10-CM | POA: Diagnosis present

## 2018-11-15 DIAGNOSIS — I482 Chronic atrial fibrillation, unspecified: Secondary | ICD-10-CM | POA: Diagnosis not present

## 2018-11-15 DIAGNOSIS — Z794 Long term (current) use of insulin: Secondary | ICD-10-CM

## 2018-11-15 DIAGNOSIS — I472 Ventricular tachycardia, unspecified: Secondary | ICD-10-CM

## 2018-11-15 DIAGNOSIS — I5043 Acute on chronic combined systolic (congestive) and diastolic (congestive) heart failure: Secondary | ICD-10-CM | POA: Diagnosis not present

## 2018-11-15 DIAGNOSIS — M7989 Other specified soft tissue disorders: Secondary | ICD-10-CM | POA: Diagnosis not present

## 2018-11-15 DIAGNOSIS — Z89429 Acquired absence of other toe(s), unspecified side: Secondary | ICD-10-CM | POA: Diagnosis not present

## 2018-11-15 DIAGNOSIS — A4902 Methicillin resistant Staphylococcus aureus infection, unspecified site: Secondary | ICD-10-CM | POA: Diagnosis not present

## 2018-11-15 DIAGNOSIS — R7881 Bacteremia: Secondary | ICD-10-CM | POA: Diagnosis present

## 2018-11-15 DIAGNOSIS — I700 Atherosclerosis of aorta: Secondary | ICD-10-CM | POA: Diagnosis present

## 2018-11-15 DIAGNOSIS — J449 Chronic obstructive pulmonary disease, unspecified: Secondary | ICD-10-CM | POA: Diagnosis present

## 2018-11-15 DIAGNOSIS — Z951 Presence of aortocoronary bypass graft: Secondary | ICD-10-CM

## 2018-11-15 DIAGNOSIS — M4626 Osteomyelitis of vertebra, lumbar region: Secondary | ICD-10-CM | POA: Diagnosis not present

## 2018-11-15 DIAGNOSIS — I4819 Other persistent atrial fibrillation: Secondary | ICD-10-CM | POA: Diagnosis present

## 2018-11-15 DIAGNOSIS — I361 Nonrheumatic tricuspid (valve) insufficiency: Secondary | ICD-10-CM | POA: Diagnosis not present

## 2018-11-15 DIAGNOSIS — I083 Combined rheumatic disorders of mitral, aortic and tricuspid valves: Secondary | ICD-10-CM | POA: Diagnosis present

## 2018-11-15 DIAGNOSIS — Z79899 Other long term (current) drug therapy: Secondary | ICD-10-CM

## 2018-11-15 DIAGNOSIS — I4821 Permanent atrial fibrillation: Secondary | ICD-10-CM | POA: Diagnosis present

## 2018-11-15 DIAGNOSIS — R001 Bradycardia, unspecified: Secondary | ICD-10-CM

## 2018-11-15 DIAGNOSIS — D649 Anemia, unspecified: Secondary | ICD-10-CM | POA: Diagnosis present

## 2018-11-15 DIAGNOSIS — I2510 Atherosclerotic heart disease of native coronary artery without angina pectoris: Secondary | ICD-10-CM | POA: Diagnosis present

## 2018-11-15 DIAGNOSIS — Z8614 Personal history of Methicillin resistant Staphylococcus aureus infection: Secondary | ICD-10-CM

## 2018-11-15 DIAGNOSIS — I509 Heart failure, unspecified: Secondary | ICD-10-CM

## 2018-11-15 DIAGNOSIS — I25119 Atherosclerotic heart disease of native coronary artery with unspecified angina pectoris: Secondary | ICD-10-CM | POA: Diagnosis present

## 2018-11-15 DIAGNOSIS — Z95810 Presence of automatic (implantable) cardiac defibrillator: Secondary | ICD-10-CM

## 2018-11-15 DIAGNOSIS — E1169 Type 2 diabetes mellitus with other specified complication: Secondary | ICD-10-CM | POA: Diagnosis present

## 2018-11-15 DIAGNOSIS — Z538 Procedure and treatment not carried out for other reasons: Secondary | ICD-10-CM | POA: Diagnosis present

## 2018-11-15 DIAGNOSIS — I5023 Acute on chronic systolic (congestive) heart failure: Secondary | ICD-10-CM | POA: Diagnosis present

## 2018-11-15 DIAGNOSIS — E1151 Type 2 diabetes mellitus with diabetic peripheral angiopathy without gangrene: Secondary | ICD-10-CM | POA: Diagnosis not present

## 2018-11-15 DIAGNOSIS — R570 Cardiogenic shock: Secondary | ICD-10-CM | POA: Diagnosis present

## 2018-11-15 DIAGNOSIS — K922 Gastrointestinal hemorrhage, unspecified: Secondary | ICD-10-CM | POA: Diagnosis present

## 2018-11-15 DIAGNOSIS — R8271 Bacteriuria: Secondary | ICD-10-CM | POA: Diagnosis not present

## 2018-11-15 DIAGNOSIS — I25810 Atherosclerosis of coronary artery bypass graft(s) without angina pectoris: Secondary | ICD-10-CM | POA: Diagnosis not present

## 2018-11-15 DIAGNOSIS — Z720 Tobacco use: Secondary | ICD-10-CM | POA: Diagnosis not present

## 2018-11-15 DIAGNOSIS — M4647 Discitis, unspecified, lumbosacral region: Secondary | ICD-10-CM | POA: Diagnosis not present

## 2018-11-15 DIAGNOSIS — Z9689 Presence of other specified functional implants: Secondary | ICD-10-CM

## 2018-11-15 DIAGNOSIS — I959 Hypotension, unspecified: Secondary | ICD-10-CM | POA: Diagnosis not present

## 2018-11-15 DIAGNOSIS — Z888 Allergy status to other drugs, medicaments and biological substances status: Secondary | ICD-10-CM

## 2018-11-15 DIAGNOSIS — L8990 Pressure ulcer of unspecified site, unspecified stage: Secondary | ICD-10-CM | POA: Diagnosis present

## 2018-11-15 LAB — MRSA PCR SCREENING: MRSA by PCR: NEGATIVE

## 2018-11-15 LAB — VANCOMYCIN, TROUGH: Vancomycin Tr: 12 ug/mL — ABNORMAL LOW (ref 15–20)

## 2018-11-15 LAB — GLUCOSE, CAPILLARY: Glucose-Capillary: 230 mg/dL — ABNORMAL HIGH (ref 70–99)

## 2018-11-15 MED ORDER — CYCLOBENZAPRINE HCL 10 MG PO TABS
10.0000 mg | ORAL_TABLET | Freq: Three times a day (TID) | ORAL | Status: DC | PRN
  Administered 2018-11-15 – 2018-11-21 (×14): 10 mg via ORAL
  Filled 2018-11-15 (×14): qty 1

## 2018-11-15 MED ORDER — ATORVASTATIN CALCIUM 40 MG PO TABS
40.0000 mg | ORAL_TABLET | Freq: Every day | ORAL | Status: DC
  Administered 2018-11-16 – 2018-11-21 (×6): 40 mg via ORAL
  Filled 2018-11-15 (×6): qty 1

## 2018-11-15 MED ORDER — GERHARDT'S BUTT CREAM
1.0000 "application " | TOPICAL_CREAM | Freq: Three times a day (TID) | CUTANEOUS | Status: DC | PRN
  Filled 2018-11-15: qty 1

## 2018-11-15 MED ORDER — SPIRONOLACTONE 25 MG PO TABS
25.0000 mg | ORAL_TABLET | Freq: Every day | ORAL | Status: DC
  Administered 2018-11-17 – 2018-11-21 (×5): 25 mg via ORAL
  Filled 2018-11-15 (×5): qty 1

## 2018-11-15 MED ORDER — DILTIAZEM HCL 60 MG PO TABS
30.0000 mg | ORAL_TABLET | Freq: Four times a day (QID) | ORAL | Status: DC
  Administered 2018-11-15 – 2018-11-16 (×3): 30 mg via ORAL
  Filled 2018-11-15 (×3): qty 1

## 2018-11-15 MED ORDER — ALBUTEROL SULFATE (2.5 MG/3ML) 0.083% IN NEBU: 3.0000 mL | INHALATION_SOLUTION | Freq: Four times a day (QID) | RESPIRATORY_TRACT | Status: DC | PRN

## 2018-11-15 MED ORDER — SENNOSIDES 8.8 MG/5ML PO SYRP
5.0000 mL | ORAL_SOLUTION | Freq: Every evening | ORAL | Status: DC | PRN
  Filled 2018-11-15: qty 5

## 2018-11-15 MED ORDER — ENOXAPARIN SODIUM 40 MG/0.4ML ~~LOC~~ SOLN: 40.0000 mg | SUBCUTANEOUS | Status: DC

## 2018-11-15 MED ORDER — TRAMADOL HCL 50 MG PO TABS
50.0000 mg | ORAL_TABLET | Freq: Four times a day (QID) | ORAL | Status: DC | PRN
  Administered 2018-11-15 – 2018-11-21 (×16): 50 mg via ORAL
  Filled 2018-11-15 (×16): qty 1

## 2018-11-15 MED ORDER — FUROSEMIDE 40 MG PO TABS: 40.0000 mg | ORAL_TABLET | Freq: Every day | ORAL | Status: DC

## 2018-11-15 MED ORDER — OXYCODONE-ACETAMINOPHEN 5-325 MG PO TABS
1.0000 | ORAL_TABLET | Freq: Three times a day (TID) | ORAL | Status: DC | PRN
  Administered 2018-11-15 – 2018-11-21 (×16): 1 via ORAL
  Filled 2018-11-15 (×16): qty 1

## 2018-11-15 MED ORDER — INSULIN ASPART 100 UNIT/ML ~~LOC~~ SOLN
0.0000 [IU] | Freq: Every day | SUBCUTANEOUS | Status: DC
  Administered 2018-11-15 – 2018-11-17 (×3): 2 [IU] via SUBCUTANEOUS
  Administered 2018-11-20: 4 [IU] via SUBCUTANEOUS

## 2018-11-15 MED ORDER — METOPROLOL TARTRATE 25 MG PO TABS
25.0000 mg | ORAL_TABLET | Freq: Two times a day (BID) | ORAL | Status: DC
  Administered 2018-11-15: 25 mg via ORAL
  Filled 2018-11-15: qty 1

## 2018-11-15 MED ORDER — PANTOPRAZOLE SODIUM 40 MG PO TBEC
40.0000 mg | DELAYED_RELEASE_TABLET | Freq: Two times a day (BID) | ORAL | Status: DC
  Administered 2018-11-15 – 2018-11-21 (×11): 40 mg via ORAL
  Filled 2018-11-15 (×12): qty 1

## 2018-11-15 MED ORDER — ACETAMINOPHEN 325 MG PO TABS
650.0000 mg | ORAL_TABLET | ORAL | Status: DC | PRN
  Administered 2018-11-16 – 2018-11-21 (×11): 650 mg via ORAL
  Filled 2018-11-15 (×11): qty 2

## 2018-11-15 MED ORDER — ONDANSETRON HCL 4 MG/2ML IJ SOLN: 4.0000 mg | Freq: Four times a day (QID) | INTRAMUSCULAR | Status: DC | PRN

## 2018-11-15 MED ORDER — RIFAMPIN 300 MG PO CAPS
300.0000 mg | ORAL_CAPSULE | Freq: Two times a day (BID) | ORAL | Status: DC
  Administered 2018-11-15 – 2018-11-21 (×11): 300 mg via ORAL
  Filled 2018-11-15 (×14): qty 1

## 2018-11-15 MED ORDER — VANCOMYCIN HCL IN DEXTROSE 750-5 MG/150ML-% IV SOLN
750.0000 mg | INTRAVENOUS | Status: DC
  Administered 2018-11-15 – 2018-11-21 (×7): 750 mg via INTRAVENOUS
  Filled 2018-11-15 (×10): qty 150

## 2018-11-15 MED ORDER — INSULIN DETEMIR 100 UNIT/ML ~~LOC~~ SOLN
15.0000 [IU] | Freq: Every day | SUBCUTANEOUS | Status: DC
  Administered 2018-11-15 – 2018-11-20 (×6): 15 [IU] via SUBCUTANEOUS
  Filled 2018-11-15 (×9): qty 0.15

## 2018-11-15 MED ORDER — INSULIN ASPART 100 UNIT/ML ~~LOC~~ SOLN
0.0000 [IU] | Freq: Three times a day (TID) | SUBCUTANEOUS | Status: DC
  Administered 2018-11-16: 5 [IU] via SUBCUTANEOUS
  Administered 2018-11-17: 3 [IU] via SUBCUTANEOUS
  Administered 2018-11-17: 07:00:00 2 [IU] via SUBCUTANEOUS
  Administered 2018-11-18: 5 [IU] via SUBCUTANEOUS
  Administered 2018-11-18: 3 [IU] via SUBCUTANEOUS
  Administered 2018-11-19: 07:00:00 2 [IU] via SUBCUTANEOUS
  Administered 2018-11-19 – 2018-11-20 (×2): 3 [IU] via SUBCUTANEOUS
  Administered 2018-11-20 – 2018-11-21 (×2): 2 [IU] via SUBCUTANEOUS

## 2018-11-15 MED ORDER — SACCHAROMYCES BOULARDII 250 MG PO CAPS
250.0000 mg | ORAL_CAPSULE | Freq: Two times a day (BID) | ORAL | Status: DC
  Administered 2018-11-15 – 2018-11-21 (×11): 250 mg via ORAL
  Filled 2018-11-15 (×12): qty 1

## 2018-11-15 MED ORDER — LEVOFLOXACIN 500 MG PO TABS
500.0000 mg | ORAL_TABLET | Freq: Every day | ORAL | Status: DC
  Filled 2018-11-15: qty 1

## 2018-11-15 MED ORDER — METOPROLOL TARTRATE 5 MG/5ML IV SOLN
2.5000 mg | INTRAVENOUS | Status: DC | PRN
  Administered 2018-11-16: 5 mg via INTRAVENOUS

## 2018-11-15 MED ORDER — SUCRALFATE 1 G PO TABS
1.0000 g | ORAL_TABLET | Freq: Three times a day (TID) | ORAL | Status: DC
  Administered 2018-11-15 – 2018-11-21 (×23): 1 g via ORAL
  Filled 2018-11-15 (×27): qty 1

## 2018-11-15 NOTE — H&P (Signed)
Cardiology Admission History and Physical:   Patient ID: Henry Mcpherson MRN: [id number]; DOB: 03/28/1956   Admission date: (Not on file)  Primary Care Provider: System, Pcp Not In Primary Cardiologist: Dr. Shelby Dubin Primary Electrophysiologist:  Dr. Rayann Heman (last admission)  Chief Complaint:  transfer from [facility] 2/2 tachy-brady syndrome  Patient Profile:   Henry Mcpherson is a 63 y.o. 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  History of Present Illness:   Henry Mcpherson history leading to his last hospital stay was that on 3/4 he underwent lumbar decompression and fusion surgery due to cauda equina syndrome, he continued with back pain and LE weakness unfortunately.  Then late May/early June was hospitalized to [facility] due todiabetic toe infection. Patient was seen by podiatry at an outside facility and status post resection. Blood cultures at the facility were positive for MRSA bacteremia, ultimately admitted to [facility] 10/10/2018 2/2 MRSA bacteremia, ultimately found with endocarditis requiring his PPM be extracted (10/16/2018).  Post PPM extraction an MRI confirmed chronic T12-L1 osteomyelitis/discitis, concern for L3-L4 and L5-S1 discitis, severe spinal canal stenosis at T12-L1, residual moderate to severe bilateral neuroforaminal stenosis at L4-L5 and L5-S1. No abscess noted.  He also underwent right-sided thoracentesis with aspiration of30 mL of thick brown fluid.  ID followed with complicated MRSA disseminated infection includingharware spine, pacemaker (explanted), and right sided empyema.  During this  stay he was also noted to have a stage II pressure ulcer that he had PTA.  He was discharged to home with [facility] care.  In regards to his tachy-brady, during his stay, his pacing rate was turned to 30, his home dig and BB were held and he maintained SR without bradycardia, his PPM was extracted with hopes to avoid new PPM indefinitely if able with very high risk of re-infection given infected hardware in his back particularly. It does not appear he underwent additional surgery after his PPM was extracted.  He was transferred after discussion between Dr. Shelby Dubin and Dr. Lovena Le accepting with Der. Basill's concerns of AFib w/RVR and brady episodes, now without PPM. He has had VR's in the 200 range. He has been treated with uptitration of his AV nodal blocking drugs.     Heart Pathway Score:     Past Medical History:  Diagnosis Date   CAD (coronary artery disease)    CHF (congestive heart failure) (HCC)    COPD (chronic obstructive pulmonary disease) (HCC)    DDD (degenerative disc disease), cervical    Diabetes mellitus without complication ([place])    Hypertension    Osteomyelitis ([place])    Permanent atrial fibrillation    RBBB    Sleep apnea    Tachycardia-bradycardia syndrome ([place])     Past Surgical History:  Procedure Laterality Date   BACK SURGERY     BUBBLE STUDY  10/13/2018   Procedure: BUBBLE STUDY;  Surgeon: Elouise Munroe, MD;  Location: [place];  Service: Cardiology;;   CORONARY ARTERY BYPASS GRAFT     1980s at [facility] in [place] W/IMG [place]  10/12/2018   LAMINECTOMY     PACEMAKER GENERATOR CHANGE  10/30/2015   SJM Assurity  VR by Dr Alden Server in [place]  05/01/2009   SJM PPM implanted by Dr Lysle Rubens for bradycardia in [place] N/A 10/16/2018   Procedure: PACEMAKER EXTRACTION;  Surgeon: Evans Lance, MD;  Location: [place];  Service: Cardiovascular;   Laterality: N/A;   TEE WITHOUT CARDIOVERSION N/A 10/13/2018   Procedure: TRANSESOPHAGEAL ECHOCARDIOGRAM (TEE);  Surgeon: Elouise Munroe, MD;  Location: [place];  Service: Cardiology;  Laterality: N/A;   TEE WITHOUT CARDIOVERSION N/A 10/16/2018   Procedure: TRANSESOPHAGEAL ECHOCARDIOGRAM (TEE);  Surgeon: Evans Lance, MD;  Location: [facility] OR;  Service: Cardiovascular;  Laterality: N/A;   TOE AMPUTATION       Medications Prior to Admission: Prior to Admission medications   Medication Sig Start Date End Date Taking? Authorizing Provider  acetaminophen (TYLENOL) 325 MG tablet Take 2 tablets (650 mg total) by mouth every 4 (four) hours as needed for mild pain ((score 1 to 3) or temp > 100.5). 07/12/18   Masoudi, Elhamalsadat, MD  albuterol (PROVENTIL HFA;VENTOLIN HFA) 108 (90 Base) MCG/ACT inhaler Inhale 1-2 puffs into the lungs every 6 (six) hours as needed for wheezing or shortness of breath.    [provider]  atorvastatin (LIPITOR) 40 MG tablet Take 40 mg by mouth daily.    [provider]  [employer] VITAMIN B12 1000 MCG tablet Take 1,000 mcg by mouth daily. 09/05/18   [provider]  cyclobenzaprine (FLEXERIL) 10 MG tablet Take 1 tablet (10 mg total) by mouth 2 (two) times daily as needed for muscle spasms. 07/12/18   Masoudi, Dorthula Rue, MD  furosemide (LASIX) 40 MG tablet Take 1 tablet (40 mg total) by mouth 2 (two) times daily for 30 days. 10/24/18 11/23/18  Terrilee Croak, MD  insulin aspart (NOVOLOG) 100 UNIT/ML injection Inject 0-9 Units into the skin 3 (three) times daily with meals. Patient not taking: Reported on 11/08/2018 10/24/18   Terrilee Croak, MD  LEVEMIR FLEXTOUCH 100 UNIT/ML Pen Inject 5 Units into the skin every evening. 10/24/18   Dahal, Marlowe Aschoff, MD  pantoprazole (PROTONIX) 40 MG tablet Take 40 mg by mouth 2 (two) times daily. 05/26/18   [provider]  potassium chloride SA (K-DUR) 20 MEQ tablet Take 1 tablet (20 mEq total) by mouth daily for  30 days. 10/24/18 11/23/18  Terrilee Croak, MD  Probiotic Product (PROBIOTIC DAILY PO) Take 1 capsule by mouth daily.    [provider]  rifampin (RIFADIN) 300 MG capsule Take 1 capsule (300 mg total) by mouth every 12 (twelve) hours. 10/24/18 11/24/18  Dahal, Marlowe Aschoff, MD  senna-docusate (SENOKOT-S) 8.6-50 MG tablet Take 1 tablet by mouth 2 (two) times daily as needed for mild constipation. Patient not taking: Reported on 11/08/2018 07/12/18   Masoudi, Dorthula Rue, MD  sucralfate (CARAFATE) 1 g tablet Take 1 g by mouth 4 (four) times daily. 09/05/18   [provider]  Vancomycin (VANCOCIN) 750-5 MG/150ML-% SOLN Inject 150 mLs (750 mg total) into the vein daily for 30 days. 10/25/18 11/24/18  Terrilee Croak, MD  vancomycin IVPB Inject 750 mg into the vein daily for 28 days. Indication: Disseminated MRSA Infection  Last Day of Therapy:  28 days  Labs - Sunday/Monday:  CBC/D, BMP, and vancomycin trough. Labs - Thursday:  BMP and vancomycin trough Labs - Every other week:  ESR and CRP 10/24/18 11/21/18  Terrilee Croak, MD     Allergies:    Allergies  Allergen Reactions   Warfarin And Related  Itching and Rash    Social History:   Social History   Socioeconomic History   Marital status: Married    Spouse name: Not on file   Number of children: Not on file   Years of education: Not on file   Highest education level: Not on file  Occupational History   Not on file  Social Needs   Financial resource strain: Not on file   Food insecurity    Worry: Not on file    Inability: Not on file   Transportation needs    Medical: Not on file    Non-medical: Not on file  Tobacco Use   Smoking status: Current Some Day Smoker    Types: Cigarettes   Smokeless tobacco: Never Used  Substance and Sexual Activity   Alcohol use: Not Currently   Drug use: Not Currently   Sexual activity: Not Currently  Lifestyle   Physical activity    Days per week: Not on file    Minutes per  session: Not on file   Stress: Not on file  Relationships   Social connections    Talks on phone: Not on file    Gets together: Not on file    Attends religious service: Not on file    Active member of club or organization: Not on file    Attends meetings of clubs or organizations: Not on file    Relationship status: Not on file   Intimate partner violence    Fear of current or ex partner: Not on file    Emotionally abused: Not on file    Physically abused: Not on file    Forced sexual activity: Not on file  Other Topics Concern   Not on file  Social History Narrative   Lives with spouse in [place]   2 grown children   Employed by [employer]     Family History:   The patient's family history is not on file.    ROS:  Please see the history of present illness.  All other ROS reviewed and negative.     Physical Exam/Data:  There were no vitals filed for this visit. No intake or output data in the 24 hours ending 11/15/18 1504 Last 3 Weights 10/25/2018 10/23/2018 10/22/2018  Weight (lbs) 181 lb 183 lb 6.8 oz 183 lb 6.8 oz  Weight (kg) 82.1 kg 83.2 kg 83.2 kg     There is no height or weight on file to calculate BMI.  General:  Well nourished, well developed, in no acute distress HEENT: normal Lymph: no adenopathy Neck: no JVD Endocrine:  No thryomegaly Vascular: No carotid bruits; FA pulses 2+ bilaterally without bruits  Cardiac:  normal S1, S2; IRRR; no murmur  Lungs:  clear to auscultation bilaterally, no wheezing, rhonchi or rales  Abd: soft, nontender, no hepatomegaly  Ext: 2+ edema on left and 1+ on the right Musculoskeletal:  No deformities, BUE and BLE strength normal and equal Skin: warm and dry  Neuro:  CNs 2-12 intact, no focal abnormalities noted Psych:  Normal affect    EKG:  The ECG that was done today was personally reviewed and demonstrates probable atrial flutter with  A controlled VR  Relevant CV Studies:   10/13/2018:  TEE IMPRESSIONS 1. There is a 2.1 x 0.4 cm lesion seen in the right atrium, attached to the pacemaker lead (possibly the RA lead). It has a small mobile component. It does not appear calcified. In the setting of bacteremia,  concern for infectious vegetation. Cannot  exclude acute thrombus or chronic organized thrombus.  2. The left ventricle has low normal systolic function, with an ejection fraction of 50-55%. The cavity size was normal.  3. The right ventricle has mildly reduced systolic function. The cavity was moderately enlarged.  4. Left atrial size was mildly dilated.  5. No evidence of a thrombus present in the left atrial appendage.  6. There is mild mitral annular calcification present. Mitral valve regurgitation is moderate by color flow Doppler. The MR jet is centrally-directed. No mitral valve vegetation visualized.  7. No tricuspid valve vegetation visualized.  8. No vegetation on the aortic valve.  9. The aortic valve is grossly normal Moderate sclerosis of the aortic valve. Aortic valve regurgitation is trivial by color flow Doppler. The jet is centrally-directed. 10. There is mild dilatation of the ascending aorta measuring 40 mm. 11. There is evidence of mild plaque in the descending aorta.  FINDINGS  Left Ventricle: The left ventricle has low normal systolic function, with an ejection fraction of 50-55%. The cavity size was normal. The left ventricular wall thickness was not assessed.  Right Ventricle: The right ventricle has mildly reduced systolic function. The cavity was moderately enlarged. Pacing wire/catheter visualized in the right ventricle.  Left Atrium: Left atrial size was mildly dilated.  Left Atrial Appendage: No evidence of a thrombus present in the left atrial appendage.  Right Atrium: Right atrial size was normal in size. Right atrial pressure is estimated at 0 mmHg.  Interatrial Septum: No atrial level shunt detected by color flow Doppler.  Agitated saline contrast was given intravenously to evaluate for intracardiac shunting. Saline contrast bubble study was negative, with no evidence of any interatrial shunt.  Increased thickness of the atrial septum sparing the fossa ovalis consistent with The interatrial septum appears to be lipomatous.  Pericardium: There is no evidence of pericardial effusion.  Mitral Valve: There is mild mitral annular calcification present. Mitral valve regurgitation is moderate by color flow Doppler. The MR jet is centrally-directed. There is no evidence of mitral valve vegetation.  Tricuspid Valve: The tricuspid valve was grossly normal. Tricuspid valve regurgitation is mild by color flow Doppler. No TV vegetation was visualized.  Aortic Valve: The aortic valve is grossly normal Moderate sclerosis of the aortic valve. Aortic valve regurgitation is trivial by color flow Doppler. The jet is centrally-directed. There is no evidence of a vegetation on the aortic valve.  Pulmonic Valve: The pulmonic valve was normal in structure. Pulmonic valve regurgitation is trivial by color flow Doppler.  Aorta: There is mild dilatation of the ascending aorta measuring 40 mm. There is evidence of mild plaque in the descending aorta.    10/11/2018 TTE IMPRESSIONS 1. The left ventricle has normal systolic function, with an ejection fraction of 55-60%. The cavity size was normal. There is moderately increased left ventricular wall thickness. Left ventricular diastolic Doppler parameters are indeterminate.  2. Beat to beat variablity in LV function. Image quality suboptimal for the detection of regional wall motion abnormalities.  3. The right ventricle has moderately reduced systolic function. The cavity was normal. There is no increase in right ventricular wall thickness.  4. Left atrial size was mildly dilated.  5. The mitral valve is abnormal. There is mild to moderate mitral annular calcification present.  6. The  aortic valve is grossly normal. Moderate sclerosis of the aortic valve.  7. No valvular vegetations noted, however image quality was suboptimal for the detection small valvular  lesions.  FINDINGS  Left Ventricle: The left ventricle has normal systolic function, with an ejection fraction of 55-60%. The cavity size was normal. There is moderately increased left ventricular wall thickness. Left ventricular diastolic Doppler parameters are  indeterminate. Beat to beat variablity in LV function. Image quality suboptimal for the detection of regional wall motion abnormalities.  Right Ventricle: The right ventricle has moderately reduced systolic function. The cavity was normal. There is no increase in right ventricular wall thickness. Pacing wire/catheter visualized in the right ventricle.  Left Atrium: Left atrial size was mildly dilated.  Right Atrium: Right atrial size was normal in size. Right atrial pressure is estimated at 10 mmHg.  Interatrial Septum: No atrial level shunt detected by color flow Doppler.  Pericardium: There is no evidence of pericardial effusion.  Mitral Valve: The mitral valve is abnormal. There is mild to moderate mitral annular calcification present. Mitral valve regurgitation is mild by color flow Doppler.  Tricuspid Valve: The tricuspid valve is normal in structure. Tricuspid valve regurgitation is trivial by color flow Doppler.  Aortic Valve: The aortic valve is grossly normal Moderate sclerosis of the aortic valve. Aortic valve regurgitation was not visualized by color flow Doppler.  Pulmonic Valve: The pulmonic valve was not well visualized. Pulmonic valve regurgitation is trivial by color flow Doppler.  Aorta: The aortic root and ascending aorta are normal in size and structure.  Venous: The inferior vena cava was not well visualized.     Laboratory Data:  High Sensitivity Troponin:  No results for input(s): TROPONINIHS in the last 720 hours.     Cardiac EnzymesNo results for input(s): TROPONINI in the last 168 hours. No results for input(s): TROPIPOC in the last 168 hours.  ChemistryNo results for input(s): NA, K, CL, CO2, GLUCOSE, BUN, CREATININE, CALCIUM, GFRNONAA, GFRAA, ANIONGAP in the last 168 hours.  No results for input(s): PROT, ALBUMIN, AST, ALT, ALKPHOS, BILITOT in the last 168 hours. HematologyNo results for input(s): WBC, RBC, HGB, HCT, MCV, MCH, MCHC, RDW, PLT in the last 168 hours. BNPNo results for input(s): BNP, PROBNP in the last 168 hours.  DDimer No results for input(s): DDIMER in the last 168 hours.   Radiology/Studies:  No results found.  Assessment and Plan:   1. Tachy-brady - his rate is currently well controlled. I told him that while he might require PPM, we will try to hold off on this as long as possible. We will adjust his medical therapy 2. H/o PPM though extracted 10/16/2018 2/2 MRSA bacteremia/endocarditis     He will need surveillance blood cultures prior to any new PPM. 3. Atrial fibrillation, likely persistent     CHA2DS2Vasc is 4, not on a/c with h/o PUD/GIB     And we have no plans for DCCV.   4. Recent MRSA      disseminated infection includingharware spine, pacemaker (explanted), and right sided empyema     Discharged to home 10/24/2018     consult ID to case (Dr. Retia Passe following outpatient, saw last 11/08/2018). Question is whether or not he could safely undergo PPM insertion if needed.   5. CAD     He has had angina with his RVR in atrial fib/flutter.    6. DM     Continue current treatment   7. HTN     As above.    Severity of Illness: The appropriate patient status for this patient is INPATIENT. Inpatient status is judged to be reasonable and necessary in order to  provide the required intensity of service to ensure the patient's safety. The patient's presenting symptoms, physical exam findings, and initial radiographic and laboratory data in the context of their chronic  comorbidities is felt to place them at high risk for further clinical deterioration. Furthermore, it is not anticipated that the patient will be medically stable for discharge from the hospital within 2 midnights of admission. The following factors support the patient status of inpatient.   " The patient's presenting symptoms include chest pressure and sob . " The worrisome physical exam findings include generalized debility and skin break down and RVR. " The initial radiographic and laboratory data are worrisome because of the possibility of occult infection. " The chronic co-morbidities include atrial fib, CAD, angina and debilitation.   * I certify that at the point of admission it is my clinical judgment that the patient will require inpatient hospital care spanning beyond 2 midnights from the point of admission due to high intensity of service, high risk for further deterioration and high frequency of surveillance required.*    For questions or updates, please contact [place] Please consult [email or web address] for contact info under        Signed, Mikle Bosworth.D.

## 2018-11-15 NOTE — Progress Notes (Signed)
Pharmacy Antibiotic Note  Henry Mcpherson is a 63 y.o. male admitted on 11/15/2018 from OSH for bradycardia.  He was recently at [facility] for ABx s/p MRSA bacteremia.osteo with back hardware, and vegetation on PPM leads - now removed.   Pharmacy has been consulted for vancomycin and levofloxacin  Dosing. Cr stable 0.8, WBC wnl, afebrile VT on admit 12 - on vancomycin 750mg  q24hr ( dose he was on here previously with AUC 460 - at goal) OSH started levofloxacin for UTI - will continue x5 more days  Plan: Vancomycin 750mg  q24h x8 weeks - ends 8/20 per ID note  Rifampin 300mg  Q12h Levofloxacin 500mg  q24h x5 days   Height: 6\' 2"  (188 cm) Weight: 181 lb 3.5 oz (82.2 kg) IBW/kg (Calculated) : 82.2  Temp (24hrs), Avg:97.9 F (36.6 C), Min:97.6 F (36.4 C), Max:98.2 F (36.8 C)  Recent Labs  Lab 11/15/18 1842  VANCOTROUGH 12*    CrCl cannot be calculated (Patient's most recent lab result is older than the maximum 21 days allowed.).    Allergies  Allergen Reactions  . Warfarin And Related Itching and Rash    Antimicrobials this admission: vanc 750mg  q24 Rifampin 300mg  q12h levoflox 500mg  q24 x5d  Dose adjustments this admission:   Microbiology results: Hx BCX MRSA  Bonnita Nasuti Pharm.D. CPP, BCPS Clinical Pharmacist [phone] 11/15/2018 7:44 PM

## 2018-11-15 NOTE — Plan of Care (Signed)
  Problem: Education: Goal: Knowledge of General Education information will improve Description: Including pain rating scale, medication(s)/side effects and non-pharmacologic comfort measures Outcome: Progressing   Problem: Clinical Measurements: Goal: Ability to maintain clinical measurements within normal limits will improve Outcome: Progressing Goal: Diagnostic test results will improve Outcome: Progressing   

## 2018-11-15 NOTE — Telephone Encounter (Signed)
Dawn from [facility] called for Dr. Alden Server, who wanted to speak with Dr. Rayann Heman.    Call transferred to me due to covering DOD - Dr. Johnsie Cancel.  Per Dr. Johnsie Cancel, I will forward to EP to review/contact Dr. Alden Server. Per Epic pt had pacemaker extraction while hospitalized at [facility] in June. Per the nurse, Dawn, pt currently hospitalized at [facility].  Please call [phone] to reach Dr. Alden Server.

## 2018-11-16 ENCOUNTER — Inpatient Hospital Stay (HOSPITAL_COMMUNITY): Payer: Medicare Other | Admitting: Certified Registered Nurse Anesthetist

## 2018-11-16 ENCOUNTER — Inpatient Hospital Stay (HOSPITAL_COMMUNITY): Payer: Medicare Other

## 2018-11-16 ENCOUNTER — Other Ambulatory Visit: Payer: Self-pay

## 2018-11-16 ENCOUNTER — Inpatient Hospital Stay (HOSPITAL_COMMUNITY)
Admission: AD | Disposition: A | Discharge status: Home Health | Payer: Self-pay | Source: Other Acute Inpatient Hospital | Attending: Internal Medicine

## 2018-11-16 ENCOUNTER — Encounter (HOSPITAL_COMMUNITY): Payer: Self-pay | Admitting: Cardiology

## 2018-11-16 DIAGNOSIS — E1151 Type 2 diabetes mellitus with diabetic peripheral angiopathy without gangrene: Secondary | ICD-10-CM

## 2018-11-16 DIAGNOSIS — G834 Cauda equina syndrome: Secondary | ICD-10-CM

## 2018-11-16 DIAGNOSIS — I959 Hypotension, unspecified: Secondary | ICD-10-CM

## 2018-11-16 DIAGNOSIS — Z720 Tobacco use: Secondary | ICD-10-CM

## 2018-11-16 DIAGNOSIS — I340 Nonrheumatic mitral (valve) insufficiency: Secondary | ICD-10-CM

## 2018-11-16 DIAGNOSIS — M7989 Other specified soft tissue disorders: Secondary | ICD-10-CM

## 2018-11-16 DIAGNOSIS — M48061 Spinal stenosis, lumbar region without neurogenic claudication: Secondary | ICD-10-CM

## 2018-11-16 DIAGNOSIS — Z8614 Personal history of Methicillin resistant Staphylococcus aureus infection: Secondary | ICD-10-CM

## 2018-11-16 DIAGNOSIS — Z951 Presence of aortocoronary bypass graft: Secondary | ICD-10-CM

## 2018-11-16 DIAGNOSIS — I472 Ventricular tachycardia, unspecified: Secondary | ICD-10-CM

## 2018-11-16 DIAGNOSIS — R8271 Bacteriuria: Secondary | ICD-10-CM

## 2018-11-16 DIAGNOSIS — R570 Cardiogenic shock: Secondary | ICD-10-CM

## 2018-11-16 DIAGNOSIS — I110 Hypertensive heart disease with heart failure: Principal | ICD-10-CM

## 2018-11-16 DIAGNOSIS — Z981 Arthrodesis status: Secondary | ICD-10-CM

## 2018-11-16 DIAGNOSIS — Z888 Allergy status to other drugs, medicaments and biological substances status: Secondary | ICD-10-CM

## 2018-11-16 DIAGNOSIS — I5022 Chronic systolic (congestive) heart failure: Secondary | ICD-10-CM

## 2018-11-16 DIAGNOSIS — I2510 Atherosclerotic heart disease of native coronary artery without angina pectoris: Secondary | ICD-10-CM

## 2018-11-16 DIAGNOSIS — I25810 Atherosclerosis of coronary artery bypass graft(s) without angina pectoris: Secondary | ICD-10-CM

## 2018-11-16 DIAGNOSIS — I495 Sick sinus syndrome: Secondary | ICD-10-CM

## 2018-11-16 DIAGNOSIS — R001 Bradycardia, unspecified: Secondary | ICD-10-CM

## 2018-11-16 DIAGNOSIS — J449 Chronic obstructive pulmonary disease, unspecified: Secondary | ICD-10-CM

## 2018-11-16 DIAGNOSIS — I361 Nonrheumatic tricuspid (valve) insufficiency: Secondary | ICD-10-CM

## 2018-11-16 HISTORY — PX: RIGHT HEART CATH: CATH118263

## 2018-11-16 HISTORY — PX: TEMPORARY PACEMAKER: CATH118268

## 2018-11-16 HISTORY — PX: LEFT HEART CATH AND CORONARY ANGIOGRAPHY: CATH118249

## 2018-11-16 HISTORY — PX: IABP INSERTION: CATH118242

## 2018-11-16 LAB — CBC WITH DIFFERENTIAL/PLATELET
Abs Immature Granulocytes: 0.13 10*3/uL — ABNORMAL HIGH (ref 0.00–0.07)
Basophils Absolute: 0.1 10*3/uL (ref 0.0–0.1)
Basophils Relative: 0 %
Eosinophils Absolute: 0.1 10*3/uL (ref 0.0–0.5)
Eosinophils Relative: 0 %
HCT: 33.6 % — ABNORMAL LOW (ref 39.0–52.0)
Hemoglobin: 9.9 g/dL — ABNORMAL LOW (ref 13.0–17.0)
Immature Granulocytes: 1 %
Lymphocytes Relative: 9 %
Lymphs Abs: 1.7 10*3/uL (ref 0.7–4.0)
MCH: 27.6 pg (ref 26.0–34.0)
MCHC: 29.5 g/dL — ABNORMAL LOW (ref 30.0–36.0)
MCV: 93.6 fL (ref 80.0–100.0)
Monocytes Absolute: 1.2 10*3/uL — ABNORMAL HIGH (ref 0.1–1.0)
Monocytes Relative: 6 %
Neutro Abs: 16.2 10*3/uL — ABNORMAL HIGH (ref 1.7–7.7)
Neutrophils Relative %: 84 %
Platelets: 307 10*3/uL (ref 150–400)
RBC: 3.59 MIL/uL — ABNORMAL LOW (ref 4.22–5.81)
RDW: 20.1 % — ABNORMAL HIGH (ref 11.5–15.5)
WBC: 19.3 10*3/uL — ABNORMAL HIGH (ref 4.0–10.5)
nRBC: 0 % (ref 0.0–0.2)

## 2018-11-16 LAB — POCT I-STAT 7, (LYTES, BLD GAS, ICA,H+H)
Acid-base deficit: 2 mmol/L (ref 0.0–2.0)
Bicarbonate: 22.5 mmol/L (ref 20.0–28.0)
Calcium, Ion: 1.26 mmol/L (ref 1.15–1.40)
HCT: 31 % — ABNORMAL LOW (ref 39.0–52.0)
Hemoglobin: 10.5 g/dL — ABNORMAL LOW (ref 13.0–17.0)
O2 Saturation: 100 %
Potassium: 3.5 mmol/L (ref 3.5–5.1)
Sodium: 138 mmol/L (ref 135–145)
TCO2: 24 mmol/L (ref 22–32)
pCO2 arterial: 38.6 mmHg (ref 32.0–48.0)
pH, Arterial: 7.374 (ref 7.350–7.450)
pO2, Arterial: 410 mmHg — ABNORMAL HIGH (ref 83.0–108.0)

## 2018-11-16 LAB — POCT I-STAT EG7
Acid-base deficit: 2 mmol/L (ref 0.0–2.0)
Acid-base deficit: 3 mmol/L — ABNORMAL HIGH (ref 0.0–2.0)
Acid-base deficit: 1 mmol/L (ref 0.0–2.0)
Acid-base deficit: 2 mmol/L (ref 0.0–2.0)
Bicarbonate: 24.6 mmol/L (ref 20.0–28.0)
Bicarbonate: 24.7 mmol/L (ref 20.0–28.0)
Bicarbonate: 25.7 mmol/L (ref 20.0–28.0)
Bicarbonate: 26.3 mmol/L (ref 20.0–28.0)
Calcium, Ion: 1.26 mmol/L (ref 1.15–1.40)
Calcium, Ion: 1.29 mmol/L (ref 1.15–1.40)
Calcium, Ion: 1.33 mmol/L (ref 1.15–1.40)
Calcium, Ion: 1.34 mmol/L (ref 1.15–1.40)
HCT: 31 % — ABNORMAL LOW (ref 39.0–52.0)
HCT: 32 % — ABNORMAL LOW (ref 39.0–52.0)
HCT: 33 % — ABNORMAL LOW (ref 39.0–52.0)
HCT: 33 % — ABNORMAL LOW (ref 39.0–52.0)
Hemoglobin: 10.5 g/dL — ABNORMAL LOW (ref 13.0–17.0)
Hemoglobin: 10.9 g/dL — ABNORMAL LOW (ref 13.0–17.0)
Hemoglobin: 11.2 g/dL — ABNORMAL LOW (ref 13.0–17.0)
Hemoglobin: 11.2 g/dL — ABNORMAL LOW (ref 13.0–17.0)
O2 Saturation: 37 %
O2 Saturation: 38 %
O2 Saturation: 56 %
O2 Saturation: 56 %
Potassium: 3.4 mmol/L — ABNORMAL LOW (ref 3.5–5.1)
Potassium: 3.5 mmol/L (ref 3.5–5.1)
Potassium: 3.9 mmol/L (ref 3.5–5.1)
Potassium: 3.9 mmol/L (ref 3.5–5.1)
Sodium: 137 mmol/L (ref 135–145)
Sodium: 140 mmol/L (ref 135–145)
Sodium: 138 mmol/L (ref 135–145)
Sodium: 139 mmol/L (ref 135–145)
TCO2: 26 mmol/L (ref 22–32)
TCO2: 26 mmol/L (ref 22–32)
TCO2: 27 mmol/L (ref 22–32)
TCO2: 28 mmol/L (ref 22–32)
pCO2, Ven: 52.2 mmHg (ref 44.0–60.0)
pCO2, Ven: 53.3 mmHg (ref 44.0–60.0)
pCO2, Ven: 56.2 mmHg (ref 44.0–60.0)
pCO2, Ven: 58.5 mmHg (ref 44.0–60.0)
pH, Ven: 7.272 (ref 7.250–7.430)
pH, Ven: 7.283 (ref 7.250–7.430)
pH, Ven: 7.262 (ref 7.250–7.430)
pH, Ven: 7.268 (ref 7.250–7.430)
pO2, Ven: 25 mmHg — CL (ref 32.0–45.0)
pO2, Ven: 25 mmHg — CL (ref 32.0–45.0)
pO2, Ven: 34 mmHg (ref 32.0–45.0)
pO2, Ven: 34 mmHg (ref 32.0–45.0)

## 2018-11-16 LAB — BASIC METABOLIC PANEL
Anion gap: 10 (ref 5–15)
BUN: 23 mg/dL (ref 8–23)
CO2: 23 mmol/L (ref 22–32)
Calcium: 8.6 mg/dL — ABNORMAL LOW (ref 8.9–10.3)
Chloride: 104 mmol/L (ref 98–111)
Creatinine, Ser: 0.75 mg/dL (ref 0.61–1.24)
GFR calc Af Amer: >60 mL/min (ref >60)
GFR calc non Af Amer: >60 mL/min (ref >60)
Glucose, Bld: 219 mg/dL — ABNORMAL HIGH (ref 70–99)
Potassium: 4 mmol/L (ref 3.5–5.1)
Sodium: 137 mmol/L (ref 135–145)

## 2018-11-16 LAB — POCT ACTIVATED CLOTTING TIME: Activated Clotting Time: 120 seconds

## 2018-11-16 LAB — GLUCOSE, CAPILLARY
Glucose-Capillary: 212 mg/dL — ABNORMAL HIGH (ref 70–99)
Glucose-Capillary: 236 mg/dL — ABNORMAL HIGH (ref 70–99)

## 2018-11-16 LAB — ECHOCARDIOGRAM COMPLETE
Height: 74 in
Weight: 2892.44 oz

## 2018-11-16 LAB — MAGNESIUM: Magnesium: 1.8 mg/dL (ref 1.7–2.4)

## 2018-11-16 SURGERY — LEFT HEART CATH AND CORONARY ANGIOGRAPHY
Anesthesia: LOCAL

## 2018-11-16 MED ORDER — GENERIC EXTERNAL MEDICATION
Status: DC
Start: ? — End: 2018-11-16

## 2018-11-16 MED ORDER — ASPIRIN 81 MG PO CHEW
81.0000 mg | CHEWABLE_TABLET | Freq: Every day | ORAL | Status: DC
  Administered 2018-11-16 – 2018-11-21 (×6): 81 mg via ORAL
  Filled 2018-11-16 (×6): qty 1

## 2018-11-16 MED ORDER — HEPARIN SODIUM (PORCINE) 5000 UNIT/ML IJ SOLN
5000.0000 [IU] | Freq: Three times a day (TID) | INTRAMUSCULAR | Status: DC
  Administered 2018-11-16 – 2018-11-21 (×16): 5000 [IU] via SUBCUTANEOUS
  Filled 2018-11-16 (×16): qty 1

## 2018-11-16 MED ORDER — OMEPRAZOLE 20 MG PO CPDR
40.00 | DELAYED_RELEASE_CAPSULE | ORAL | Status: DC
Start: 2018-11-16 — End: 2018-11-16

## 2018-11-16 MED ORDER — HEPARIN (PORCINE) IN NACL 1000-0.9 UT/500ML-% IV SOLN
INTRAVENOUS | Status: DC | PRN
  Administered 2018-11-16 (×2): 500 mL

## 2018-11-16 MED ORDER — HEPARIN (PORCINE) IN NACL 1000-0.9 UT/500ML-% IV SOLN
INTRAVENOUS | Status: AC
  Filled 2018-11-16: qty 500

## 2018-11-16 MED ORDER — ALBUTEROL SULFATE HFA 108 (90 BASE) MCG/ACT IN AERS
2.00 | INHALATION_SPRAY | RESPIRATORY_TRACT | Status: DC
Start: ? — End: 2018-11-16

## 2018-11-16 MED ORDER — LIDOCAINE HCL (PF) 1 % IJ SOLN
INTRAMUSCULAR | Status: AC
  Filled 2018-11-16: qty 30

## 2018-11-16 MED ORDER — ZOLPIDEM TARTRATE 5 MG PO TABS
10.00 | ORAL_TABLET | ORAL | Status: DC
Start: ? — End: 2018-11-16

## 2018-11-16 MED ORDER — SODIUM CHLORIDE 0.9% FLUSH: 3.0000 mL | INTRAVENOUS | Status: DC | PRN

## 2018-11-16 MED ORDER — SODIUM CHLORIDE 0.9 % IV SOLN
INTRAVENOUS | Status: AC | PRN
  Administered 2018-11-16: 10 mL/h via INTRAVENOUS

## 2018-11-16 MED ORDER — METOPROLOL TARTRATE 25 MG PO TABS
25.00 | ORAL_TABLET | ORAL | Status: DC
Start: 2018-11-15 — End: 2018-11-16

## 2018-11-16 MED ORDER — GENERIC EXTERNAL MEDICATION
750.00 | Status: DC
Start: 2018-11-15 — End: 2018-11-16

## 2018-11-16 MED ORDER — SODIUM CHLORIDE 0.9 % WEIGHT BASED INFUSION
1.0000 mL/kg/h | INTRAVENOUS | Status: AC
  Administered 2018-11-16: 1 mL/kg/h via INTRAVENOUS

## 2018-11-16 MED ORDER — CYCLOBENZAPRINE HCL 10 MG PO TABS
10.00 | ORAL_TABLET | ORAL | Status: DC
Start: ? — End: 2018-11-16

## 2018-11-16 MED ORDER — INSULIN DETEMIR 100 UNIT/ML ~~LOC~~ SOLN
15.00 | SUBCUTANEOUS | Status: DC
Start: 2018-11-15 — End: 2018-11-16

## 2018-11-16 MED ORDER — RIFAMPIN 300 MG PO CAPS
300.00 | ORAL_CAPSULE | ORAL | Status: DC
Start: 2018-11-15 — End: 2018-11-16

## 2018-11-16 MED ORDER — SACCHAROMYCES BOULARDII 250 MG PO CAPS
250.00 | ORAL_CAPSULE | ORAL | Status: DC
Start: 2018-11-15 — End: 2018-11-16

## 2018-11-16 MED ORDER — SUCRALFATE 1 G PO TABS
1.00 | ORAL_TABLET | ORAL | Status: DC
Start: 2018-11-15 — End: 2018-11-16

## 2018-11-16 MED ORDER — ATORVASTATIN CALCIUM 20 MG PO TABS
40.00 | ORAL_TABLET | ORAL | Status: DC
Start: 2018-11-15 — End: 2018-11-16

## 2018-11-16 MED ORDER — LEVOFLOXACIN IN D5W 500 MG/100ML IV SOLN
500.00 | INTRAVENOUS | Status: DC
Start: 2018-11-16 — End: 2018-11-16

## 2018-11-16 MED ORDER — SODIUM CHLORIDE 0.9% FLUSH
3.0000 mL | Freq: Two times a day (BID) | INTRAVENOUS | Status: DC
  Administered 2018-11-16 – 2018-11-18 (×4): 3 mL via INTRAVENOUS

## 2018-11-16 MED ORDER — MAGNESIUM SULFATE 2 GM/50ML IV SOLN
2.0000 g | Freq: Once | INTRAVENOUS | Status: AC
  Administered 2018-11-16: 2 g via INTRAVENOUS
  Filled 2018-11-16: qty 50

## 2018-11-16 MED ORDER — NOREPINEPHRINE 4 MG/250ML-% IV SOLN
0.0000 ug/min | INTRAVENOUS | Status: DC
  Administered 2018-11-16: 12:00:00 16 ug/min via INTRAVENOUS
  Filled 2018-11-16 (×2): qty 250

## 2018-11-16 MED ORDER — ENOXAPARIN SODIUM 40 MG/0.4ML ~~LOC~~ SOLN: 40.0000 mg | SUBCUTANEOUS | Status: DC

## 2018-11-16 MED ORDER — IOHEXOL 350 MG/ML SOLN
INTRAVENOUS | Status: DC | PRN
  Administered 2018-11-16: 95 mL via INTRA_ARTERIAL

## 2018-11-16 MED ORDER — OXYCODONE-ACETAMINOPHEN 5-325 MG PO TABS
1.00 | ORAL_TABLET | ORAL | Status: DC
Start: ? — End: 2018-11-16

## 2018-11-16 MED ORDER — POLYETHYLENE GLYCOL 3350 17 G PO PACK
17.00 | PACK | ORAL | Status: DC
Start: ? — End: 2018-11-16

## 2018-11-16 MED ORDER — ETOMIDATE 2 MG/ML IV SOLN
INTRAVENOUS | Status: DC | PRN
  Administered 2018-11-16: 10 mg via INTRAVENOUS

## 2018-11-16 MED ORDER — ACETAMINOPHEN 325 MG PO TABS
650.00 | ORAL_TABLET | ORAL | Status: DC
Start: ? — End: 2018-11-16

## 2018-11-16 MED ORDER — FUROSEMIDE 40 MG PO TABS
40.00 | ORAL_TABLET | ORAL | Status: DC
Start: 2018-11-16 — End: 2018-11-16

## 2018-11-16 MED ORDER — DOBUTAMINE IN D5W 4-5 MG/ML-% IV SOLN
2.0000 ug/kg/min | INTRAVENOUS | Status: DC
  Administered 2018-11-16: 2 ug/kg/min via INTRAVENOUS
  Filled 2018-11-16: qty 250

## 2018-11-16 MED ORDER — METOPROLOL TARTRATE 5 MG/5ML IV SOLN
INTRAVENOUS | Status: AC
  Filled 2018-11-16: qty 5

## 2018-11-16 MED ORDER — LIDOCAINE HCL (PF) 1 % IJ SOLN
INTRAMUSCULAR | Status: DC | PRN
  Administered 2018-11-16: 15 mL
  Administered 2018-11-16: 18 mL

## 2018-11-16 MED ORDER — AMIODARONE HCL IN DEXTROSE 360-4.14 MG/200ML-% IV SOLN
30.0000 mg/h | INTRAVENOUS | Status: DC
  Administered 2018-11-16 – 2018-11-17 (×3): 30 mg/h via INTRAVENOUS
  Filled 2018-11-16 (×3): qty 200

## 2018-11-16 MED ORDER — GENERIC EXTERNAL MEDICATION
8.60 | Status: DC
Start: ? — End: 2018-11-16

## 2018-11-16 MED ORDER — SODIUM CHLORIDE 0.9 % IV SOLN
100.00 | INTRAVENOUS | Status: DC
Start: ? — End: 2018-11-16

## 2018-11-16 MED ORDER — SPIRONOLACTONE 25 MG PO TABS
25.00 | ORAL_TABLET | ORAL | Status: DC
Start: 2018-11-16 — End: 2018-11-16

## 2018-11-16 MED ORDER — TRAMADOL HCL 50 MG PO TABS
50.00 | ORAL_TABLET | ORAL | Status: DC
Start: ? — End: 2018-11-16

## 2018-11-16 MED ORDER — AMIODARONE HCL IN DEXTROSE 360-4.14 MG/200ML-% IV SOLN
60.0000 mg/h | INTRAVENOUS | Status: AC
  Administered 2018-11-16 (×2): 60 mg/h via INTRAVENOUS
  Filled 2018-11-16 (×2): qty 200

## 2018-11-16 MED ORDER — ENOXAPARIN SODIUM 40 MG/0.4ML ~~LOC~~ SOLN
40.00 | SUBCUTANEOUS | Status: DC
Start: 2018-11-16 — End: 2018-11-16

## 2018-11-16 MED ORDER — POTASSIUM CHLORIDE 10 MEQ/50ML IV SOLN
10.0000 meq | INTRAVENOUS | Status: AC
  Administered 2018-11-16 (×3): 10 meq via INTRAVENOUS
  Filled 2018-11-16 (×3): qty 50

## 2018-11-16 MED ORDER — FUROSEMIDE 10 MG/ML IJ SOLN
40.0000 mg | Freq: Once | INTRAMUSCULAR | Status: AC
  Administered 2018-11-16: 40 mg via INTRAVENOUS
  Filled 2018-11-16: qty 4

## 2018-11-16 MED ORDER — ASPIRIN EC 81 MG PO TBEC
81.00 | DELAYED_RELEASE_TABLET | ORAL | Status: DC
Start: 2018-11-16 — End: 2018-11-16

## 2018-11-16 MED ORDER — FENTANYL CITRATE (PF) 100 MCG/2ML IJ SOLN
INTRAMUSCULAR | Status: DC | PRN
  Administered 2018-11-16: 25 ug via INTRAVENOUS

## 2018-11-16 MED ORDER — FENTANYL CITRATE (PF) 100 MCG/2ML IJ SOLN
INTRAMUSCULAR | Status: AC
  Filled 2018-11-16: qty 2

## 2018-11-16 MED ORDER — MIDAZOLAM HCL 2 MG/2ML IJ SOLN
INTRAMUSCULAR | Status: DC | PRN
  Administered 2018-11-16: 1 mg via INTRAVENOUS

## 2018-11-16 MED ORDER — MIDAZOLAM HCL 2 MG/2ML IJ SOLN
INTRAMUSCULAR | Status: AC
  Filled 2018-11-16: qty 2

## 2018-11-16 MED ORDER — SODIUM CHLORIDE 0.9 % IV SOLN
INTRAVENOUS | Status: AC | PRN
  Administered 2018-11-16 (×2): 10 mL/h via INTRAVENOUS

## 2018-11-16 MED ORDER — DIGOXIN 125 MCG PO TABS
0.1250 mg | ORAL_TABLET | Freq: Every day | ORAL | Status: DC
  Administered 2018-11-17 – 2018-11-18 (×2): 0.125 mg via ORAL
  Filled 2018-11-16 (×2): qty 1

## 2018-11-16 MED ORDER — DIGOXIN 125 MCG PO TABS: 0.1250 mg | ORAL_TABLET | Freq: Every day | ORAL | Status: DC

## 2018-11-16 MED ORDER — SODIUM CHLORIDE 0.9 % IV SOLN
250.0000 mL | INTRAVENOUS | Status: DC | PRN
  Administered 2018-11-18: 10:00:00 250 mL via INTRAVENOUS

## 2018-11-16 MED ORDER — BISACODYL 5 MG PO TBEC
10.00 | DELAYED_RELEASE_TABLET | ORAL | Status: DC
Start: ? — End: 2018-11-16

## 2018-11-16 MED FILL — Medication: Qty: 1 | Status: AC

## 2018-11-16 SURGICAL SUPPLY — 24 items
BAG SNAP BAND KOVER 36X36 (MISCELLANEOUS) ×4 IMPLANT
CABLE ADAPT CONN TEMP 6FT (ADAPTER) ×2 IMPLANT
CATH EXPO 5F MPA-1 (CATHETERS) ×2 IMPLANT
CATH INFINITI 5 FR RCB (CATHETERS) ×2 IMPLANT
CATH INFINITI 5FR MULTPACK ANG (CATHETERS) ×2 IMPLANT
CATH S G BIP PACING (CATHETERS) ×2 IMPLANT
CATH SWAN GANZ 7F STRAIGHT (CATHETERS) ×4 IMPLANT
COVER DOME SNAP 22 D (MISCELLANEOUS) ×4 IMPLANT
KIT ENCORE 26 ADVANTAGE (KITS) ×2 IMPLANT
KIT HEART LEFT (KITS) ×2 IMPLANT
PACK CARDIAC CATHETERIZATION (CUSTOM PROCEDURE TRAY) ×4 IMPLANT
PROTECTION STATION PRESSURIZED (MISCELLANEOUS) ×2
SHEATH PINNACLE 5F 10CM (SHEATH) ×2 IMPLANT
SHEATH PINNACLE 6F 10CM (SHEATH) ×2 IMPLANT
SHEATH PINNACLE 7F 10CM (SHEATH) ×4 IMPLANT
SHEATH PROBE COVER 6X72 (BAG) ×2 IMPLANT
SLEEVE REPOSITIONING LENGTH 30 (MISCELLANEOUS) ×4 IMPLANT
STATION PROTECTION PRESSURIZED (MISCELLANEOUS) ×1 IMPLANT
SYR MEDRAD MARK 7 150ML (SYRINGE) ×2 IMPLANT
TRANSDUCER W/STOPCOCK (MISCELLANEOUS) ×4 IMPLANT
TUBING ART PRESS 72  MALE/FEM (TUBING) ×1
TUBING ART PRESS 72 MALE/FEM (TUBING) ×1 IMPLANT
TUBING CIL FLEX 10 FLL-RA (TUBING) ×2 IMPLANT
WIRE EMERALD 3MM-J .035X150CM (WIRE) ×2 IMPLANT

## 2018-11-16 NOTE — H&P (View-Only) (Signed)
Patient ID: Henry Mcpherson, male   DOB: 1955-11-07, 63 y.o.   MRN: [id number]   Progress Note  Patient Name: Henry Mcpherson Date of Encounter: 11/16/2018  Primary Cardiologist: No primary care provider on file.   Subjective   "I dont feel good."  Inpatient Medications    Scheduled Meds: . [MAR Hold] atorvastatin  40 mg Oral q1800  . [MAR Hold] diltiazem  30 mg Oral Q6H  . [MAR Hold] enoxaparin (LOVENOX) injection  40 mg Subcutaneous Q24H  . [MAR Hold] furosemide  40 mg Oral Daily  . [MAR Hold] insulin aspart  0-15 Units Subcutaneous TID WC  . [MAR Hold] insulin aspart  0-5 Units Subcutaneous QHS  . [MAR Hold] insulin detemir  15 Units Subcutaneous QHS  . [MAR Hold] levofloxacin  500 mg Oral Daily  . metoprolol tartrate      . [MAR Hold] metoprolol tartrate  25 mg Oral BID  . [MAR Hold] pantoprazole  40 mg Oral BID  . [MAR Hold] rifampin  300 mg Oral Q12H  . [MAR Hold] saccharomyces boulardii  250 mg Oral BID  . [MAR Hold] spironolactone  25 mg Oral Daily  . [MAR Hold] sucralfate  1 g Oral TID WC & HS   Continuous Infusions: . [MAR Hold] vancomycin Stopped (11/15/18 2310)   PRN Meds: [MAR Hold] acetaminophen, [MAR Hold] albuterol, [MAR Hold] cyclobenzaprine, [MAR Hold] Gerhardt's butt cream, Heparin (Porcine) in NaCl, lidocaine (PF), [MAR Hold] metoprolol tartrate, [MAR Hold] ondansetron (ZOFRAN) IV, [MAR Hold] oxyCODONE-acetaminophen, [MAR Hold] sennosides, [MAR Hold] traMADol   Vital Signs    Vitals:   11/16/18 0309 11/16/18 0400 11/16/18 0644 11/16/18 0828  BP: 108/76 115/79    Pulse: 89 (!) 58    Resp:      Temp: 98.7 F (37.1 C)     TempSrc: Oral     SpO2: 98% 97%  94%  Weight:   82 kg   Height:        Intake/Output Summary (Last 24 hours) at 11/16/2018 0845 Last data filed at 11/16/2018 0500 Gross per 24 hour  Intake 150 ml  Output 750 ml  Net -600 ml   Filed Weights   11/15/18 1800 11/16/18 0644  Weight: 82.2 kg 82 kg    Telemetry    VT -  Personally Reviewed  ECG    VT - Personally Reviewed  Physical Exam   GEN: acutely ill appearing   Neck: unable to assess JVD Cardiac: Reg tachy, no murmurs, rubs, or gallops.  Respiratory: Clear to auscultation bilaterally. GI: Soft, nontender, non-distended  MS: No edema; No deformity. Extremities are cool. Neuro:  Nonfocal  Psych: Normal affect   Labs    ChemistryNo results for input(s): NA, K, CL, CO2, GLUCOSE, BUN, CREATININE, CALCIUM, PROT, ALBUMIN, AST, ALT, ALKPHOS, BILITOT, GFRNONAA, GFRAA, ANIONGAP in the last 168 hours.   HematologyNo results for input(s): WBC, RBC, HGB, HCT, MCV, MCH, MCHC, RDW, PLT in the last 168 hours.  Cardiac EnzymesNo results for input(s): TROPONINI in the last 168 hours. No results for input(s): TROPIPOC in the last 168 hours.   BNPNo results for input(s): BNP, PROBNP in the last 168 hours.   DDimer No results for input(s): DDIMER in the last 168 hours.   Radiology    No results found.  Cardiac Studies   none  Patient Profile     63 y.o. male admitted with uncontrolled atrial fib with a RVR/slow VR now with sustained monomorphic VT  Assessment &  Plan    1. VT/Hypotension - he has been treated with IV lopressor and reverted back to atrial fib and then has gone back to VT, been given anesthesia and cardioverted. He was then PEA and CPR/IV pressors given. He will continue IV amiodarone. 2. Hypotension/shock - Unclear if this represents global ischemia. He will undergo right and left heart cath.  3. Bradycardia - he was initially quite slow after cardioversion and he will be treated with back up temporary pacing. 4. Prior MRSA infection - he has been treated for several weeks. If he survives his acute event, he will require ICD. I will reach out to the ID service to make rec's on when we might be able to place ICD.  Charlane Westry,M.D.  For questions or updates, please contact [place] Please consult [email or web address] for contact info  under Cardiology/STEMI.      Signed, Cristopher Peru, MD  11/16/2018, 8:45 AM

## 2018-11-16 NOTE — Progress Notes (Signed)
Secretary called per RN requesting we bring the team to bedside. HR >220. Confirmed they wanted to call a code, advised to activate code blue and we were on our way.  Dr. Curt Bears at bedside, 5 mg Lopressor given prior to our arrival. HR 66. Pt transferred to [facility] On arrival to [place] pt's HR >200, reports feeling dizzy, back in VT, amiodarone bolus/gtt started, anesthesia paged to bedside, 10 mg Etomidate given, cardioverted, PEA, CPRx2 transferred to cath lab when stable for temp pacer.

## 2018-11-16 NOTE — Consult Note (Signed)
[place] Nurse wound consult note Reason for Consult: Right heel, LGT surgical (amputation) site, coccygeal wound, abdominal and perineal skin folds  Patient events of this morning noted upon arrival to see for wound consult.  Will communicate with treatment team and see if later in the day would work for a skin assessment.   Thanks, Maudie Flakes, MSN, RN, [place], Arther Abbott  Pager# [phone]

## 2018-11-16 NOTE — Consult Note (Signed)
[facility]    Date of Admission:  11/15/2018      Total days of antibiotics 36 Vancomycin + Rifampin   (28 days since last surgical procedure related to infection)              Reason for Consult: H/O MRSA PPM Endocarditis, needs new PPM    Referring Provider: Dr. Ladona Ridgel  Primary Care Provider: System, Pcp Not In    Assessment: Henry Mcpherson is a 63 y.o. 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 He has been receiving vancomycin + rifampin continuously without interruption since June 10th. His PPM was removed on 6/15 and drainage of empyema on 6/19. Per guidelines regarding cardiac device associated endocarditis without native valve involvement recommendations he is well within a safe range to re-implant his PPM given he has completed 28 days of dual recommended therapy since his last infection related surgery. While we have not yet addressed the hardware in his back he has had no evidence of uncontrolled/inadequately treated infection currently and certainly need for PPM outweighs delaying further. Blood cultures are no growth from previous hospital @ 48 hours.   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 I don't see a quantified colony count from urine culture at [facility] but he had no urinary complaints prior to hospitalization or presently. Would D/C levaquin especially given his current issues surrounding arrhythmias.    Plan: 1. Continue vancomycin IV 2. Continue Rifampin 300 mg BID PO 3. OK to place PPM once cardiology team feels stabilized to do so 4. 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 aspirin  81 mg Oral Daily  . atorvastatin  40 mg Oral q1800  . heparin injection (subcutaneous)  5,000 Units Subcutaneous Q8H  . insulin aspart  0-15 Units Subcutaneous TID WC  . insulin aspart  0-5 Units Subcutaneous QHS  . insulin detemir  15 Units Subcutaneous QHS  . levofloxacin  500 mg Oral Daily  . metoprolol tartrate      . pantoprazole  40 mg Oral BID  . rifampin  300 mg Oral Q12H  . saccharomyces boulardii  250 mg Oral BID  . sodium chloride flush  3 mL Intravenous Q12H  . spironolactone  25 mg Oral Daily  . sucralfate  1 g Oral TID WC & HS    HPI: Henry Mcpherson is a 63 y.o. 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   He has a history of complicated and widely disseminated MRSA infection including great toe (thought to be original source and now s/p amputation), T12-L1 L3-L4 and L5-S1 discitis with evidence of infection  of spinal hardware (no surgical intervention  yet - was being planned outpatient with Dr. Wynetta Emeryram), pacemaker (s/p extraction) and right sided empyema (s/p drainage). He had a prolonged hospital stay given the multiple interventions described above but was discharged on IV vancomycin + Rifampin BID x 8 weeks planned through 12/21/2018. Last seen in ID office with Dr. Drue SecondSnider on 7/08. He was getting 2x weekly safety labs that were within expected limits. He was other than diarrhea associated with his antibiotics doing very well.    He presented to  [facility] with 3 episodes of chest pain/shortness of breath and fatigue. Found to have recurrent symptomatic tachy-brady syndrome and [facility]. Earlier today was in rapid atrial fibrillation that evolved into monmorphic ventricular tachycardia requiring Code Blue, defibrillation/CPR, antiarrhythmics and epinephrine. He had ROSC with good mentation following code and was taken to cath lab urgently for planned IABP but this was deferred with normalization of hemodynamics. Currently in CVICU with dobutamine, norepinephrine, amiodarone infusing and invasive hemodynamic monitoring.   Reviewed ID data from [facility] - urine culture with un-quantified colony count of pseudomonas that was pansensitive; blood cultures negative x 2. Afebrile. In discussion with Henry Mcpherson he denied any urinary symptoms, fevers, chills, sweats prior to hospitalization. He currently is chest pain free and breathing comfortably. His back pain is the same quality as before but no worse. He has not yet followed up with Dr. Wynetta Emeryram regarding surgical planning of his spine to address the loosened hardware. PICC line is without pain, drainage or erythema and is well maintained by [facility]. No swelling or altered sensation in affected distal extremity.  No new wounds to declare on LEs.    Review of Systems: Review of Systems  Constitutional: Negative for chills, fever, malaise/fatigue and weight loss.  HENT:       No dental problems  Respiratory: Negative for cough.   Cardiovascular: Positive for leg swelling (chronic, intermittent). Negative for chest pain.  Gastrointestinal: Negative for abdominal pain, diarrhea and vomiting.  Genitourinary: Negative for dysuria and flank pain.  Musculoskeletal: Positive for back pain. Negative for joint pain, myalgias and neck pain.  Skin: Negative for rash.  Neurological: Negative for dizziness, tingling and headaches.    Past Medical  History:  Diagnosis Date  . CAD (coronary artery disease)   . CHF (congestive heart failure) (HCC)   . COPD (chronic obstructive pulmonary disease) (HCC)   . DDD (degenerative disc disease), cervical   . Diabetes mellitus without complication (HCC)   . Hypertension   . Osteomyelitis (HCC)   . Permanent atrial fibrillation   . RBBB   . Sleep apnea   . Tachycardia-bradycardia syndrome (HCC)     Social History   Tobacco Use  . Smoking status: Current Some Day Smoker    Types: Cigarettes  . Smokeless tobacco: Never Used  Substance Use Topics  . Alcohol use: Not Currently  . Drug use: Not Currently    History reviewed. No pertinent family history. Allergies  Allergen Reactions  . Warfarin And Related Itching and Rash    OBJECTIVE: Blood pressure 103/70, pulse 74, temperature 98.7 F (37.1 C), temperature source Oral, resp. rate (!) 0, height 6\' 2"  (1.88 m), weight 82 kg, SpO2 99 %.  Physical Exam Constitutional:      Comments: Elderly man sleeping in bed in no distress. Breathing comfortably. Awakens easily.   HENT:     Mouth/Throat:     Mouth: Mucous membranes are moist.  Pharynx: Oropharynx is clear. No oropharyngeal exudate.  Cardiovascular:     Rate and Rhythm: Normal rate and regular rhythm.     Heart sounds: Gallop present. S3 sounds present.   Pulmonary:     Effort: Pulmonary effort is normal.     Breath sounds: Normal breath sounds.  Abdominal:     General: Bowel sounds are normal.     Tenderness: There is no abdominal tenderness.  Skin:    General: Skin is warm and dry.     Comments: Chronic venous stasis findings on B/L LEs without any active infection. Dry flaking skin on L exposed toes - small blood on left #2 toe. Previous incision seems well healed. Did not assess sacral wound given TVP in groin.    RE PICC line - clean/dry dressing. Insertion site w/o erythema, tenderness, drainage, cording or distal swelling of affected extremity    Lab Results  Lab Results  Component Value Date   WBC 19.3 (H) 11/16/2018   HGB 9.9 (L) 11/16/2018   HCT 33.6 (L) 11/16/2018   MCV 93.6 11/16/2018   PLT 307 11/16/2018    Lab Results  Component Value Date   CREATININE 0.75 11/16/2018   BUN 23 11/16/2018   NA 137 11/16/2018   K 4.0 11/16/2018   CL 104 11/16/2018   CO2 23 11/16/2018    Lab Results  Component Value Date   ALT 7 10/19/2018   AST 15 10/19/2018   ALKPHOS 99 10/19/2018   BILITOT 0.6 10/19/2018     Microbiology: Recent Results (from the past 240 hour(s))  MRSA PCR Screening     Status: None   Collection Time: 11/15/18  6:56 PM   Specimen: Nasopharyngeal  Result Value Ref Range Status   MRSA by PCR NEGATIVE NEGATIVE Final    Comment:        The GeneXpert MRSA Assay (FDA approved for NASAL specimens only), is one component of a comprehensive MRSA colonization surveillance program. It is not intended to diagnose MRSA infection nor to guide or monitor treatment for MRSA infections. Performed at [facility], [place] [address] [place],  [address]     Janene Madeira, MSN, NP-C [facility] [place] Cell: [phone] Pager: [phone]  11/16/2018 3:03 PM

## 2018-11-16 NOTE — Consult Note (Signed)
Advanced Heart Failure Team Consult Note   Primary Physician: System, Pcp Not In PCP-Cardiologist:  No primary care provider on file.  Reason for Consultation: Cardiogenic shock  HPI:    Henry Mcpherson is seen today for evaluation of CHF/VT/cardiogenic shock at the request of Renee Ursay/Dr. Lovena Le.   Patient is a 63 y.o. 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  He had lumbar decompression in 3/20 and fusion surgery because of cauda equina syndrome.  He continued to have back pain afterwards.  He was hospitalized in [place] in late 5/20 with diabetic toe infection. He ended up have diabetic left great toe amputation.  Blood culture showed MRSA.  He was ultimately found to have vegetation on his pacemaker in 6/20 at [facility], and PPM was extracted.  He was also found to have chronic T12-L1 osteomyelitis/discitis and residual spinal stenosis at L4-5, L5-S1 levels. He was found as well to have a right-sided empyema on thoracentesis.  ID was following.  TEE in 6/20 showed LVEF 50-55%.   [name] had been to avoid placement of a new PPM as HR appeared to be reasonable off nodal blockers. He was admitted back to the hospital in [place] in 7/20 with afib/RVR and also bradycardic episodes and transferred here.   This morning, the patient developed monomorphic VT and was cardioverted, he had PEA afterwards and required epinephrine to regain a perfusing rhythm.  He was not intubated. His rate was slow post-code, he was sent to the cath lab for RHC/LHC/temporary pacemaker.   Temporary transvenous pacer was placed.  Cath  showed occluded SVG-RCA and occluded RCA with collaterals.  The left system looked ok.  No intervention.  Initial RHC showed mildly elevated filling pressures with cardiac index 1.5 by Fick.  Repeat RHC was done with plan to place IABP if output remained low.  However, the patient had made some improvement with CI 2.25 by Fick and 1.8 by thermo.  Given improvement, it was decided to leave Swan in place but not place IABP at this time.   He is currently awake, HR 70s in NSR.  SBP 110s on norepinephrine 16. He says that he feels better.  He was initially on lidocaine + amiodarone, now just amiodarone gtt.   Review of Systems:  All systems reviewed and negative except as per HPI.   Home Medications Prior to Admission medications   Medication Sig Start Date End Date Taking? Authorizing Provider  acetaminophen (TYLENOL) 650 MG CR tablet Take 650 mg by mouth every 8 (eight) hours as needed for pain.   Yes [provider]  albuterol (PROVENTIL HFA;VENTOLIN HFA) 108 (90 Base) MCG/ACT inhaler Inhale 1-2 puffs into the lungs every 6 (six) hours as needed for wheezing or shortness of breath.   Yes [provider]  atorvastatin (LIPITOR) 40 MG tablet Take 40 mg by mouth See admin instructions. Take 40 mg by mouth every other night   Yes [provider]  [employer] VITAMIN B12 1000 MCG tablet Take 1,000 mcg by mouth daily. 09/05/18  Yes [provider]  cyclobenzaprine (FLEXERIL) 10 MG tablet Take 1 tablet (10 mg total) by mouth 2 (two)  times daily as needed for muscle spasms. 07/12/18  Yes Masoudi, Elhamalsadat, MD  diphenhydramine-acetaminophen (TYLENOL PM) 25-500 MG TABS tablet Take 1 tablet by mouth at bedtime as needed (for sleep).   Yes [provider]  furosemide (LASIX) 40 MG tablet Take 1 tablet (40 mg total) by mouth 2 (two) times daily for 30 days. 10/24/18 11/23/18 Yes Dahal, Marlowe Aschoff, MD  LEVEMIR FLEXTOUCH 100 UNIT/ML Pen Inject 5 Units into the skin every evening.  Patient taking differently: Inject 15-20 Units into the skin at bedtime.  10/24/18  Yes Dahal, Marlowe Aschoff, MD  pantoprazole (PROTONIX) 40 MG tablet Take 40 mg by mouth 2 (two) times daily. 05/26/18  Yes [provider]  potassium chloride SA (K-DUR) 20 MEQ tablet Take 1 tablet (20 mEq total) by mouth daily for 30 days. 10/24/18 11/23/18 Yes Dahal, Marlowe Aschoff, MD  Probiotic Product (PROBIOTIC DAILY PO) Take 1 capsule by mouth daily.   Yes [provider]  rifampin (RIFADIN) 300 MG capsule Take 1 capsule (300 mg total) by mouth every 12 (twelve) hours. 10/24/18 11/24/18 Yes Dahal, Marlowe Aschoff, MD  sucralfate (CARAFATE) 1 g tablet Take 1 g by mouth 4 (four) times daily. 09/05/18  Yes [provider]  traMADol (ULTRAM) 50 MG tablet Take 50 mg by mouth every 6 (six) hours as needed for moderate pain.  11/09/18  Yes [provider]  Vancomycin (VANCOCIN) 750-5 MG/150ML-% SOLN Inject 150 mLs (750 mg total) into the vein daily for 30 days. 10/25/18 11/24/18 Yes Dahal, Marlowe Aschoff, MD  zolpidem (AMBIEN) 10 MG tablet Take 10 mg by mouth at bedtime as needed for sleep.   Yes [provider]  acetaminophen (TYLENOL) 325 MG tablet Take 2 tablets (650 mg total) by mouth every 4 (four) hours as needed for mild pain ((score 1 to 3) or temp > 100.5). Patient not taking: Reported on 11/15/2018 07/12/18   Masoudi, Dorthula Rue, MD  insulin aspart (NOVOLOG) 100 UNIT/ML injection Inject 0-9 Units into the skin 3 (three) times daily with meals. Patient not taking: Reported on 11/15/2018 10/24/18   Terrilee Croak, MD  senna-docusate (SENOKOT-S) 8.6-50 MG tablet Take 1 tablet by mouth 2 (two) times daily as needed for mild constipation. Patient not taking: Reported on 11/15/2018 07/12/18   Masoudi, Dorthula Rue, MD  vancomycin IVPB Inject 750 mg into the vein daily for 28 days. Indication: Disseminated MRSA Infection  Last Day of Therapy:  28 days  Labs - Sunday/Monday:  CBC/D, BMP, and vancomycin trough. Labs -  Thursday:  BMP and vancomycin trough Labs - Every other week:  ESR and CRP 10/24/18 11/21/18  Terrilee Croak, MD    Past Medical History: 1. OSA 2. Spinal stenosis/cauda equina syndrome: s/p lumbar decompression in 3/20.  3. Left great toe infection in setting of DM: Amputation in 6/20.  4. Type 2 diabetes.  5. HTN 6. Atrial fibrillation: Paroxysmal. He has not been anticoagulated due to prior GI bleeding.  7. GI bleeding: Thought to be due to PUD.  8. Tachy-brady syndrome: Had [employer] Jude PPM, this was extracted in 10/16/18 with MRSA bacteremia/endocarditis.  9. CAD: S/p CABG in 1980s, patient had SVG-RCA.  - LHC 7/20 with totally occluded RCA with collaterals and totally occluded SVG-RCA, left system with mild disease.  10. COPD: Smoker.  11. Chronic systolic CHF: Ischemic cardiomyopathy.   - Echo in 6/20 with EF 55-60%.  - LHC this admission with LV-gram showing EF 25% with inferior akinesis.   Past Surgical History: Past Surgical History:  Procedure Laterality  Date  . BACK SURGERY    . BUBBLE STUDY  10/13/2018   Procedure: BUBBLE STUDY;  Surgeon: Elouise Munroe, MD;  Location: [facility] ENDOSCOPY;  Service: Cardiology;;  . CORONARY ARTERY BYPASS GRAFT     1980s at [facility] in [place]  . IR LUMBAR DISC ASPIRATION W/IMG GUIDE  10/12/2018  . LAMINECTOMY    . PACEMAKER GENERATOR CHANGE  10/30/2015   SJM Assurity VR by Dr Alden Server in [place]  . PACEMAKER IMPLANT  05/01/2009   SJM PPM implanted by Dr Lysle Rubens for bradycardia in [place]  . PACEMAKER LEAD REMOVAL N/A 10/16/2018   Procedure: PACEMAKER EXTRACTION;  Surgeon: Evans Lance, MD;  Location: [place];  Service: Cardiovascular;  Laterality: N/A;  . TEE WITHOUT CARDIOVERSION N/A 10/13/2018   Procedure: TRANSESOPHAGEAL ECHOCARDIOGRAM (TEE);  Surgeon: Elouise Munroe, MD;  Location: [place];  Service: Cardiology;  Laterality: N/A;  . TEE WITHOUT CARDIOVERSION N/A 10/16/2018   Procedure: TRANSESOPHAGEAL  ECHOCARDIOGRAM (TEE);  Surgeon: Evans Lance, MD;  Location: [facility] OR;  Service: Cardiovascular;  Laterality: N/A;  . TOE AMPUTATION      Family History: History reviewed. No pertinent family history.  Social History: Social History   Socioeconomic History  . Marital status: Married    Spouse name: Not on file  . Number of children: Not on file  . Years of education: Not on file  . Highest education level: Not on file  Occupational History  . Not on file  Social Needs  . Financial resource strain: Not on file  . Food insecurity    Worry: Not on file    Inability: Not on file  . Transportation needs    Medical: Not on file    Non-medical: Not on file  Tobacco Use  . Smoking status: Current Some Day Smoker    Types: Cigarettes  . Smokeless tobacco: Never Used  Substance and Sexual Activity  . Alcohol use: Not Currently  . Drug use: Not Currently  . Sexual activity: Not Currently  Lifestyle  . Physical activity    Days per week: Not on file    Minutes per session: Not on file  . Stress: Not on file  Relationships  . Social Herbalist on phone: Not on file    Gets together: Not on file    Attends religious service: Not on file    Active member of club or organization: Not on file    Attends meetings of clubs or organizations: Not on file    Relationship status: Not on file  Other Topics Concern  . Not on file  Social History Narrative   Lives with spouse in [place]   2 grown children   Employed by [employer]     Allergies:  Allergies  Allergen Reactions  . Warfarin And Related Itching and Rash    Objective:    Vital Signs:   Temp:  [97.6 F (36.4 C)-98.7 F (37.1 C)] 98.7 F (37.1 C) (07/16 0309) Pulse Rate:  [54-163] 74 (07/16 1045) Resp:  [0-23] 0 (07/16 1045) BP: (88-134)/(57-98) 103/70 (07/16 1045) SpO2:  [88 %-100 %] 99 % (07/16 1045) Weight:  [82 kg-82.2 kg] 82 kg (07/16 0644) Last BM Date: 11/14/18  Weight change: Filed  Weights   11/15/18 1800 11/16/18 0644  Weight: 82.2 kg 82 kg    Intake/Output:   Intake/Output Summary (Last 24 hours) at 11/16/2018 1235 Last data filed at 11/16/2018 0500 Gross  per 24 hour  Intake 150 ml  Output 750 ml  Net -600 ml      Physical Exam    General:  Chronically ill-appearing HEENT: normal Neck: supple. JVP 8 cm. Carotids 2+ bilat; no bruits. No lymphadenopathy or thyromegaly appreciated. Cor: PMI nondisplaced. Regular rate & rhythm. No rubs, gallops or murmurs. Lungs: clear Abdomen: soft, nontender, nondistended. No hepatosplenomegaly. No bruits or masses. Good bowel sounds. Extremities: no cyanosis, clubbing, rash. 1+ edema 1/2 to knees bilaterally.  Neuro: alert & orientedx3, cranial nerves grossly intact. moves all 4 extremities w/o difficulty. Affect pleasant   Telemetry   NSR 70s (personally reviewed)  EKG    Atrial fibrillation rate 59 with RBBB (personally reviewed)  Labs   Basic Metabolic Panel: No results for input(s): NA, K, CL, CO2, GLUCOSE, BUN, CREATININE, CALCIUM, MG, PHOS in the last 168 hours.  Liver Function Tests: No results for input(s): AST, ALT, ALKPHOS, BILITOT, PROT, ALBUMIN in the last 168 hours. No results for input(s): LIPASE, AMYLASE in the last 168 hours. No results for input(s): AMMONIA in the last 168 hours.  CBC: No results for input(s): WBC, NEUTROABS, HGB, HCT, MCV, PLT in the last 168 hours.  Cardiac Enzymes: No results for input(s): CKTOTAL, CKMB, CKMBINDEX, TROPONINI in the last 168 hours.  BNP: BNP (last 3 results) Recent Labs    10/10/18 2004 10/17/18 0719  BNP 500.2* 231.2*    ProBNP (last 3 results) No results for input(s): PROBNP in the last 8760 hours.   CBG: Recent Labs  Lab 11/15/18 2148  GLUCAP 230*    Coagulation Studies: No results for input(s): LABPROT, INR in the last 72 hours.   Imaging    No results found.   Medications:     Current Medications: . atorvastatin  40 mg  Oral q1800  . [START ON 11/17/2018] enoxaparin (LOVENOX) injection  40 mg Subcutaneous Q24H  . insulin aspart  0-15 Units Subcutaneous TID WC  . insulin aspart  0-5 Units Subcutaneous QHS  . insulin detemir  15 Units Subcutaneous QHS  . levofloxacin  500 mg Oral Daily  . metoprolol tartrate      . pantoprazole  40 mg Oral BID  . rifampin  300 mg Oral Q12H  . saccharomyces boulardii  250 mg Oral BID  . sodium chloride flush  3 mL Intravenous Q12H  . spironolactone  25 mg Oral Daily  . sucralfate  1 g Oral TID WC & HS     Infusions: . sodium chloride    . sodium chloride 1 mL/kg/hr (11/16/18 1142)  . amiodarone 60 mg/hr (11/16/18 1139)  . amiodarone    . DOBUTamine    . norepinephrine (LEVOPHED) Adult infusion 16 mcg/min (11/16/18 1141)  . vancomycin Stopped (11/15/18 2310)      Assessment/Plan   1. VT: Monomorphic VT this morning requiring cardioversion.  LHC showed chronically occluded RCA and SVG-RCA, likely scar-mediated arrhythmia.   - Continue amiodarone gtt, can stay off lidocaine. He was not on amiodarone at home.  - Will eventually need ICD placement.  2. CAD: Had CABG in 1980s with SVG-RCA.  Cath today post-VT showed occluded RCA and occluded SVG-RCA.  There were collaterals, so likely chronic.  - Continue ASA 81 and statin.   3. Acute on chronic systolic CHF/cardiogenic shock: Ischemic cardiomyopathy.  Prior TEE in 6/20 showed EF 50-55%.  EF on LV-gram today showed EF 25% (worse than would be expected with just RCA occlusion).  Suspect there was a degree  of stunning from the cardiac arrest earlier in the day.  Initial Fick CI 1.5 improved to 2.25 on repeat RHC so IABP was not placed.  Currently stable on norepinephrine 16, feeling better. Swan in place.  - Needs BMET sent stat.  - Will add low dose dobutamine at 2 and work on titrating down norepinephrine.  - Follow closely, can always place IABP in future if needed.  - Filling pressures mildly elevated on 2nd cath,  will likely give a dose of IV Lasix today but need to see labs first.  - Needs echo.  4. Atrial fibrillation: Persistent atrial fibrillation, not anticoagulated due to history of GI bleeding.   He is back in NSR post-defibrillation today.  - He will continue amiodarone.  - Will need to readdress anticoagulation.  5. Tachy-brady syndrome:  Had been having alternating afib/RVR with bradycardia per report from hospital in [place].  Was bradycardic post-defibrillation of VT today.  Now NSR in 70s.  - He has temporary pacemaker set to 40 bpm backup.  - Will eventually need dual chamber ICD placed, timing per EP.  6. MRSA bacteremia with multifocal infections: He is currently on vancomycin, ID has been consulted.   7. Suspected UTI: He is on levofloxacin for this.  8. H/o GI bleeding: Awaiting CBC.   Length of Stay: 1  Loralie Champagne, MD  11/16/2018, 12:35 PM  Advanced Heart Failure Team Pager [phone] (M-F; 7a - 4p)  Please contact [facility] for night-coverage after hours (4p -7a ) and weekends on [email or web address]

## 2018-11-16 NOTE — Progress Notes (Signed)
   11/16/18 0813  Clinical Encounter Type  Visited With Health care provider  Visit Type Critical Care;Code  Referral From Nurse  Consult/Referral To Chaplain   Chaplain responded to both Code Blues in [place] and [place].  Chaplain worked with AC-Ron to get the patient's phone and Ron was able to connect with patient's with Tye [place] who was at their home in [place].  Dr. Lovena Le spoke to [place].  No other needs at this point.  Chaplain available for support.  [place], MDiv.

## 2018-11-16 NOTE — Transfer of Care (Signed)
Immediate Anesthesia Transfer of Care Note  Patient: Henry Mcpherson  Procedure(s) Performed: AN AD [place] OOD CARDIOVERSION  Patient Location: Cath Lab  Anesthesia Type:General  Level of Consciousness: lethargic and responds to stimulation  Airway & Oxygen Therapy: Patient Spontanous Breathing and non-rebreather face mask  Post-op Assessment: Report given to RN and Post -op Vital signs reviewed and unstable, Anesthesiologist notified  Post vital signs: Reviewed and unstable  Last Vitals:  Vitals Value Taken Time  BP    Temp    Pulse    Resp    SpO2      Last Pain:  Vitals:   11/16/18 0512  TempSrc:   PainSc: Asleep         Complications: No apparent anesthesia complications

## 2018-11-16 NOTE — Interval H&P Note (Signed)
History and Physical Interval Note:  11/16/2018 8:32 AM  Henry Mcpherson  has presented today for surgery, with the diagnosis of cardiac arrest.  The various methods of treatment have been discussed with the patient and family. After consideration of risks, benefits and other options for treatment, the patient has consented to  Procedure(s): LEFT HEART CATH AND CORONARY ANGIOGRAPHY (N/A) TEMPORARY PACEMAKER (N/A) RIGHT HEART CATH (N/A) as a surgical intervention.  The patient's history has been reviewed, patient examined, no change in status, stable for surgery.  I have reviewed the patient's chart and labs.  Questions were answered to the patient's satisfaction.   Cath Lab Visit (complete for each Cath Lab visit)  Clinical Evaluation Leading to the Procedure:   ACS: Yes.    Non-ACS:    Anginal Classification: CCS IV  Anti-ischemic medical therapy: No Therapy  Non-Invasive Test Results: No non-invasive testing performed  Prior CABG: Previous CABG        [name] [facility] 11/16/2018 8:32 AM

## 2018-11-16 NOTE — Progress Notes (Signed)
Pt arrived from [facility]. BP 60/40s.  Pt alert and talking.  Pt admitted to unit. MD Lovena Le present and pt shocked x1.  Pt converted to bradycardic rhythm in the 50s and quickly declined to PEA. Code called, CPR started. See code sheet for details.    Pt emergently transferred to the cath lab for temp pacer placement.

## 2018-11-16 NOTE — Progress Notes (Signed)
  I responded to Code Blue alarm in CCU  63 y/o man with CAD s/p remote CABG. Recent PM infection with diffuse bacteremia. Transferred from outside hospital with sepsis and VT.  This am developed recurrent VT and treated with amio and lopressor. Developed bradycardia. Seen by EP and scheduled for TVP.  Then subsequently developed hemodynamically unstable VT. Code Blue initiated. CPR started and was shocked.   Patient then remained very hypotensive. We gave 1 amp epi with improvement in BP and mentation.   I personally contacted cath lab and assisted in transporting patient to cath lab for TVP and coronary angio.   Discussed personally with Drs. Lovena Le and [place].  CRITICAL CARE Performed by: Glori Bickers  Total critical care time: 45 minutes  Critical care time was exclusive of separately billable procedures and treating other patients.  Critical care was necessary to treat or prevent imminent or life-threatening deterioration.  Critical care was time spent personally by me (independent of midlevel providers or residents) on the following activities: development of treatment plan with patient and/or surrogate as well as nursing, discussions with consultants, evaluation of patient's response to treatment, examination of patient, obtaining history from patient or surrogate, ordering and performing treatments and interventions, ordering and review of laboratory studies, ordering and review of radiographic studies, pulse oximetry and re-evaluation of patient's condition.  Glori Bickers, MD  8:57 AM

## 2018-11-16 NOTE — Progress Notes (Signed)
Patient ID: Henry Mcpherson, male   DOB: 08/13/1955, 63 y.o.   MRN: [id number]   Progress Note  Patient Name: Henry Mcpherson Date of Encounter: 11/16/2018  Primary Cardiologist: No primary care provider on file.   Subjective   "I dont feel good."  Inpatient Medications    Scheduled Meds: . [MAR Hold] atorvastatin  40 mg Oral q1800  . [MAR Hold] diltiazem  30 mg Oral Q6H  . [MAR Hold] enoxaparin (LOVENOX) injection  40 mg Subcutaneous Q24H  . [MAR Hold] furosemide  40 mg Oral Daily  . [MAR Hold] insulin aspart  0-15 Units Subcutaneous TID WC  . [MAR Hold] insulin aspart  0-5 Units Subcutaneous QHS  . [MAR Hold] insulin detemir  15 Units Subcutaneous QHS  . [MAR Hold] levofloxacin  500 mg Oral Daily  . metoprolol tartrate      . [MAR Hold] metoprolol tartrate  25 mg Oral BID  . [MAR Hold] pantoprazole  40 mg Oral BID  . [MAR Hold] rifampin  300 mg Oral Q12H  . [MAR Hold] saccharomyces boulardii  250 mg Oral BID  . [MAR Hold] spironolactone  25 mg Oral Daily  . [MAR Hold] sucralfate  1 g Oral TID WC & HS   Continuous Infusions: . [MAR Hold] vancomycin Stopped (11/15/18 2310)   PRN Meds: [MAR Hold] acetaminophen, [MAR Hold] albuterol, [MAR Hold] cyclobenzaprine, [MAR Hold] Gerhardt's butt cream, Heparin (Porcine) in NaCl, lidocaine (PF), [MAR Hold] metoprolol tartrate, [MAR Hold] ondansetron (ZOFRAN) IV, [MAR Hold] oxyCODONE-acetaminophen, [MAR Hold] sennosides, [MAR Hold] traMADol   Vital Signs    Vitals:   11/16/18 0309 11/16/18 0400 11/16/18 0644 11/16/18 0828  BP: 108/76 115/79    Pulse: 89 (!) 58    Resp:      Temp: 98.7 F (37.1 C)     TempSrc: Oral     SpO2: 98% 97%  94%  Weight:   82 kg   Height:        Intake/Output Summary (Last 24 hours) at 11/16/2018 0845 Last data filed at 11/16/2018 0500 Gross per 24 hour  Intake 150 ml  Output 750 ml  Net -600 ml   Filed Weights   11/15/18 1800 11/16/18 0644  Weight: 82.2 kg 82 kg    Telemetry    VT -  Personally Reviewed  ECG    VT - Personally Reviewed  Physical Exam   GEN: acutely ill appearing   Neck: unable to assess JVD Cardiac: Reg tachy, no murmurs, rubs, or gallops.  Respiratory: Clear to auscultation bilaterally. GI: Soft, nontender, non-distended  MS: No edema; No deformity. Extremities are cool. Neuro:  Nonfocal  Psych: Normal affect   Labs    ChemistryNo results for input(s): NA, K, CL, CO2, GLUCOSE, BUN, CREATININE, CALCIUM, PROT, ALBUMIN, AST, ALT, ALKPHOS, BILITOT, GFRNONAA, GFRAA, ANIONGAP in the last 168 hours.   HematologyNo results for input(s): WBC, RBC, HGB, HCT, MCV, MCH, MCHC, RDW, PLT in the last 168 hours.  Cardiac EnzymesNo results for input(s): TROPONINI in the last 168 hours. No results for input(s): TROPIPOC in the last 168 hours.   BNPNo results for input(s): BNP, PROBNP in the last 168 hours.   DDimer No results for input(s): DDIMER in the last 168 hours.   Radiology    No results found.  Cardiac Studies   none  Patient Profile     63 y.o. male admitted with uncontrolled atrial fib with a RVR/slow VR now with sustained monomorphic VT  Assessment &   Plan    1. VT/Hypotension - he has been treated with IV lopressor and reverted back to atrial fib and then has gone back to VT, been given anesthesia and cardioverted. He was then PEA and CPR/IV pressors given. He will continue IV amiodarone. 2. Hypotension/shock - Unclear if this represents global ischemia. He will undergo right and left heart cath.  3. Bradycardia - he was initially quite slow after cardioversion and he will be treated with back up temporary pacing. 4. Prior MRSA infection - he has been treated for several weeks. If he survives his acute event, he will require ICD. I will reach out to the ID service to make rec's on when we might be able to place ICD.  Juliene Kirsh,M.D.  For questions or updates, please contact [place] Please consult [email or web address] for contact info  under Cardiology/STEMI.      Signed, Cristopher Peru, MD  11/16/2018, 8:45 AM

## 2018-11-16 NOTE — Interval H&P Note (Signed)
History and Physical Interval Note:  11/16/2018 9:56 AM  Henry Mcpherson  has presented today for surgery, with the diagnosis of cardiac arrest.  The various methods of treatment have been discussed with the patient and family. After consideration of risks, benefits and other options for treatment, the patient has consented to  Procedure(s): LEFT HEART CATH AND CORONARY ANGIOGRAPHY (N/A) TEMPORARY PACEMAKER (N/A) RIGHT HEART CATH (N/A) RIGHT HEART CATH (N/A) IABP Insertion (N/A) as a surgical intervention.  The patient's history has been reviewed, patient examined, no change in status, stable for surgery.  I have reviewed the patient's chart and labs.  Questions were answered to the patient's satisfaction.     [place] [employer]

## 2018-11-16 NOTE — Consult Note (Signed)
[facility] Nurse wound consult note Reason for Consult:Right heel, LGT surgical (amputation) site, coccygeal wound, abdominal and perineal skin folds (all POA)  Discussed potential for visit today with patient's RN, Caitlyn. Given the critical nature of the patient's status, we determine that a visit today for skin inspection is not a priority. Will plan to see patient tomorrow, 11/17/18.   Thanks, Maudie Flakes, MSN, RN, [place], Arther Abbott  Pager# [phone]

## 2018-11-17 ENCOUNTER — Inpatient Hospital Stay (HOSPITAL_COMMUNITY): Payer: Medicare Other

## 2018-11-17 ENCOUNTER — Other Ambulatory Visit: Payer: Self-pay

## 2018-11-17 DIAGNOSIS — M4627 Osteomyelitis of vertebra, lumbosacral region: Secondary | ICD-10-CM

## 2018-11-17 DIAGNOSIS — Z89429 Acquired absence of other toe(s), unspecified side: Secondary | ICD-10-CM

## 2018-11-17 DIAGNOSIS — I4819 Other persistent atrial fibrillation: Secondary | ICD-10-CM

## 2018-11-17 DIAGNOSIS — I482 Chronic atrial fibrillation, unspecified: Secondary | ICD-10-CM

## 2018-11-17 DIAGNOSIS — A4902 Methicillin resistant Staphylococcus aureus infection, unspecified site: Secondary | ICD-10-CM

## 2018-11-17 DIAGNOSIS — M4647 Discitis, unspecified, lumbosacral region: Secondary | ICD-10-CM

## 2018-11-17 DIAGNOSIS — I5043 Acute on chronic combined systolic (congestive) and diastolic (congestive) heart failure: Secondary | ICD-10-CM

## 2018-11-17 DIAGNOSIS — M4626 Osteomyelitis of vertebra, lumbar region: Secondary | ICD-10-CM

## 2018-11-17 LAB — BASIC METABOLIC PANEL
Anion gap: 8 (ref 5–15)
BUN: 26 mg/dL — ABNORMAL HIGH (ref 8–23)
CO2: 25 mmol/L (ref 22–32)
Calcium: 9.1 mg/dL (ref 8.9–10.3)
Chloride: 104 mmol/L (ref 98–111)
Creatinine, Ser: 0.64 mg/dL (ref 0.61–1.24)
GFR calc Af Amer: >60 mL/min (ref >60)
GFR calc non Af Amer: >60 mL/min (ref >60)
Glucose, Bld: 202 mg/dL — ABNORMAL HIGH (ref 70–99)
Potassium: 3.8 mmol/L (ref 3.5–5.1)
Sodium: 137 mmol/L (ref 135–145)

## 2018-11-17 LAB — IRON AND TIBC
Iron: 19 ug/dL — ABNORMAL LOW (ref 45–182)
Saturation Ratios: 9 % — ABNORMAL LOW (ref 17.9–39.5)
TIBC: 221 ug/dL — ABNORMAL LOW (ref 250–450)
UIBC: 202 ug/dL

## 2018-11-17 LAB — CBC WITH DIFFERENTIAL/PLATELET
Abs Immature Granulocytes: 0.03 10*3/uL (ref 0.00–0.07)
Basophils Absolute: 0 10*3/uL (ref 0.0–0.1)
Basophils Relative: 0 %
Eosinophils Absolute: 0.1 10*3/uL (ref 0.0–0.5)
Eosinophils Relative: 1 %
HCT: 27.7 % — ABNORMAL LOW (ref 39.0–52.0)
Hemoglobin: 8.3 g/dL — ABNORMAL LOW (ref 13.0–17.0)
Immature Granulocytes: 0 %
Lymphocytes Relative: 15 %
Lymphs Abs: 1.3 10*3/uL (ref 0.7–4.0)
MCH: 27.9 pg (ref 26.0–34.0)
MCHC: 30 g/dL (ref 30.0–36.0)
MCV: 93 fL (ref 80.0–100.0)
Monocytes Absolute: 0.6 10*3/uL (ref 0.1–1.0)
Monocytes Relative: 7 %
Neutro Abs: 6.9 10*3/uL (ref 1.7–7.7)
Neutrophils Relative %: 77 %
Platelets: 175 10*3/uL (ref 150–400)
RBC: 2.98 MIL/uL — ABNORMAL LOW (ref 4.22–5.81)
RDW: 20 % — ABNORMAL HIGH (ref 11.5–15.5)
WBC: 9 10*3/uL (ref 4.0–10.5)
nRBC: 0 % (ref 0.0–0.2)

## 2018-11-17 LAB — GLUCOSE, CAPILLARY
Glucose-Capillary: 130 mg/dL — ABNORMAL HIGH (ref 70–99)
Glucose-Capillary: 151 mg/dL — ABNORMAL HIGH (ref 70–99)
Glucose-Capillary: 108 mg/dL — ABNORMAL HIGH (ref 70–99)
Glucose-Capillary: 237 mg/dL — ABNORMAL HIGH (ref 70–99)

## 2018-11-17 LAB — FERRITIN: Ferritin: 54 ng/mL (ref 24–336)

## 2018-11-17 LAB — COOXEMETRY PANEL
Carboxyhemoglobin: 1.6 % — ABNORMAL HIGH (ref 0.5–1.5)
Carboxyhemoglobin: 2.3 % — ABNORMAL HIGH (ref 0.5–1.5)
Methemoglobin: 0.9 % (ref 0.0–1.5)
Methemoglobin: 0.1 % (ref 0.0–1.5)
O2 Saturation: 71.7 %
O2 Saturation: 62.1 %
Total hemoglobin: 12 g/dL (ref 12.0–16.0)
Total hemoglobin: 8.7 g/dL — ABNORMAL LOW (ref 12.0–16.0)

## 2018-11-17 LAB — SEDIMENTATION RATE: Sed Rate: 38 mm/hr — ABNORMAL HIGH (ref 0–16)

## 2018-11-17 MED ORDER — GENERIC EXTERNAL MEDICATION
Status: DC
Start: ? — End: 2018-11-17

## 2018-11-17 MED ORDER — POTASSIUM CHLORIDE CRYS ER 20 MEQ PO TBCR
40.0000 meq | EXTENDED_RELEASE_TABLET | Freq: Once | ORAL | Status: AC
  Administered 2018-11-17: 40 meq via ORAL
  Filled 2018-11-17: qty 2

## 2018-11-17 MED ORDER — GERHARDT'S BUTT CREAM
TOPICAL_CREAM | Freq: Four times a day (QID) | CUTANEOUS | Status: DC
  Administered 2018-11-17 – 2018-11-21 (×16): via TOPICAL
  Administered 2018-11-21: 1 via TOPICAL
  Administered 2018-11-21: 18:00:00 via TOPICAL
  Filled 2018-11-17: qty 1

## 2018-11-17 MED ORDER — CHLORHEXIDINE GLUCONATE CLOTH 2 % EX PADS
6.0000 | MEDICATED_PAD | Freq: Every day | CUTANEOUS | Status: DC
  Administered 2018-11-17 – 2018-11-21 (×3): 6 via TOPICAL

## 2018-11-17 MED FILL — Heparin Sod (Porcine)-NaCl IV Soln 1000 Unit/500ML-0.9%: INTRAVENOUS | Qty: 500 | Status: AC

## 2018-11-17 NOTE — Progress Notes (Addendum)
Progress Note  Patient Name: Henry Mcpherson Date of Encounter: 11/17/2018  Primary Cardiologist: [facility] [place] Locally Henry Mcpherson  Subjective   Back pain is at his baseline, feeling much better then yesterday otherwise.  No CP, no SOB  Inpatient Medications    Scheduled Meds: . aspirin  81 mg Oral Daily  . atorvastatin  40 mg Oral q1800  . Chlorhexidine Gluconate Cloth  6 each Topical Daily  . digoxin  0.125 mg Oral Daily  . heparin injection (subcutaneous)  5,000 Units Subcutaneous Q8H  . insulin aspart  0-15 Units Subcutaneous TID WC  . insulin aspart  0-5 Units Subcutaneous QHS  . insulin detemir  15 Units Subcutaneous QHS  . pantoprazole  40 mg Oral BID  . potassium chloride  40 mEq Oral Once  . rifampin  300 mg Oral Q12H  . saccharomyces boulardii  250 mg Oral BID  . sodium chloride flush  3 mL Intravenous Q12H  . spironolactone  25 mg Oral Daily  . sucralfate  1 g Oral TID WC & HS   Continuous Infusions: . sodium chloride    . amiodarone 30 mg/hr (11/17/18 0600)  . vancomycin Stopped (11/16/18 2204)   PRN Meds: sodium chloride, acetaminophen, albuterol, cyclobenzaprine, Gerhardt's butt cream, metoprolol tartrate, ondansetron (ZOFRAN) IV, oxyCODONE-acetaminophen, sennosides, sodium chloride flush, traMADol   Vital Signs    Vitals:   11/17/18 0600 11/17/18 0630 11/17/18 0637 11/17/18 0700  BP: (!) 98/48 (!) 71/38 (!) 103/53 103/61  Pulse: (!) 105 61 66 62  Resp: 17 14 17 19   Temp: (!) 97 F (36.1 C) (!) 97 F (36.1 C) (!) 97 F (36.1 C) (!) 96.8 F (36 C)  TempSrc:      SpO2: 100% 99% 99% 99%  Weight:      Height:        Intake/Output Summary (Last 24 hours) at 11/17/2018 0803 Last data filed at 11/17/2018 0600 Gross per 24 hour  Intake 1465.13 ml  Output 1050 ml  Net 415.13 ml   Last 3 Weights 11/17/2018 11/16/2018 11/15/2018  Weight (lbs) 185 lb 13.6 oz 180 lb 12.4 oz 181 lb 3.5 oz  Weight (kg) 84.3 kg 82 kg 82.2 kg       Telemetry    SR, 1st AVBlock, occ V pacing, PAFib, no tachy, no VT - Personally Reviewed  ECG    Looks SR, 1st degree AVblock, RBBB, PACs - Personally Reviewed  Physical Exam   GEN: No acute distress, appears chronically ill.   Neck: No JVD Cardiac: RRR, no murmurs, rubs, or gallops.  Respiratory: CTA b/l (ant/lat auscultation only). GI: Soft, nontender, non-distended  MS: chronic looking skin changes, dressings b/l toes. Neuro:  Nonfocal  Psych: Normal affect, very pleasent  Labs    High Sensitivity Troponin:  No results for input(s): TROPONINIHS in the last 720 hours.    Cardiac EnzymesNo results for input(s): TROPONINI in the last 168 hours. No results for input(s): TROPIPOC in the last 168 hours.   Chemistry Recent Labs  Lab 11/16/18 1027 11/16/18 1048 11/17/18 0411  NA 138 137 137  K 3.9 4.0 3.8  CL  --  104 104  CO2  --  23 25  GLUCOSE  --  219* 202*  BUN  --  23 26*  CREATININE  --  0.75 0.64  CALCIUM  --  8.6* 9.1  GFRNONAA  --  >60 >60  GFRAA  --  >60 >60  ANIONGAP  --  10 8     Hematology Recent Labs  Lab 11/16/18 1027 11/16/18 1048 11/17/18 0411  WBC  --  19.3* 9.0  RBC  --  3.59* 2.98*  HGB 11.2* 9.9* 8.3*  HCT 33.0* 33.6* 27.7*  MCV  --  93.6 93.0  MCH  --  27.6 27.9  MCHC  --  29.5* 30.0  RDW  --  20.1* 20.0*  PLT  --  307 175    BNPNo results for input(s): BNP, PROBNP in the last 168 hours.   DDimer No results for input(s): DDIMER in the last 168 hours.   Radiology    Dg Chest Port 1 View Result Date: 11/16/2018 CLINICAL DATA:  CHF.  Current smoker. EXAM: PORTABLE CHEST 1 VIEW COMPARISON:  10/21/2018 FINDINGS: Right-sided PICC line and sternotomy wires unchanged. Catheter from below extending into the region of the proximal right lower lobar pulmonary artery. Small caliber lead projects over the left lower heart. Interval removal of right-sided pleural pigtail drainage catheter. Lungs are hypoinflated with mild interval improvement  of right middle lobe opacification likely improving atelectasis. No evidence of effusion or pneumothorax. Mild stable cardiomegaly. Remainder of the exam is unchanged. IMPRESSION: Improving right middle lobe density likely improving atelectasis. Tubes and lines as described. Electronically Signed   By: Henry Fortisaniel  Boyle M.D.   On: 11/16/2018 15:20    Cardiac Studies   11/16/2018: R/LHC  Mid LAD lesion is 30% stenosed.  Ost RCA to Dist RCA lesion is 100% stenosed.  SVG graft was visualized by angiography.  Origin lesion is 100% stenosed.  There is severe left ventricular systolic dysfunction.  LV end diastolic pressure is normal.  The left ventricular ejection fraction is less than 25% by visual estimate.   1. Single vessel occlusive CAD. The RCA is occluded with left to right collaterals. 2. Occluded SVG to RCA 3. Severe LV dysfunction 4. Severely reduced cardiac output 5. Normal right heart and LV filling pressures. 6. Successful placement of temporary transvenous pacemaker.  Plan: Further plans per [facility] failure and EP teams.     11/16/2018: TTE IMPRESSIONS 1. The left ventricle has a visually estimated ejection fraction of 25%. The cavity size was normal. Left ventricular diastolic parameters were normal. Left ventrical global hypokinesis without regional wall motion abnormalities.  2. The right ventricle has normal systolic function. The cavity was normal. There is no increase in right ventricular wall thickness. Right ventricular systolic pressure is normal with an estimated pressure of 39.2 mmHg.  3. Pacing wires in RA/RV.  4. Left atrial size was severely dilated.  5. Mild thickening of the mitral valve leaflet. Mild calcification of the mitral valve leaflet. There is moderate mitral annular calcification present. Mitral valve regurgitation is moderate by color flow Doppler.  6. The aortic valve is tricuspid. Moderate thickening of the aortic valve. Sclerosis without  any evidence of stenosis of the aortic valve.  7. The aortic root is normal in size and structure.  FINDINGS  Left Ventricle: The left ventricle has a visually estimated ejection fraction of 25. The cavity size was normal. There is no increase in left ventricular wall thickness. Left ventricular diastolic parameters were normal. Left ventrical global  hypokinesis without regional wall motion abnormalities.  Right Ventricle: The right ventricle has normal systolic function. The cavity was normal. There is no increase in right ventricular wall thickness. Right ventricular systolic pressure is normal with an estimated pressure of 39.2 mmHg. Pacing wires in  RA/RV.  Left Atrium: Left  atrial size was severely dilated.  Right Atrium: Right atrial size was normal in size. Right atrial pressure is estimated at 15 mmHg.  Interatrial Septum: No atrial level shunt detected by color flow Doppler.  Pericardium: There is no evidence of pericardial effusion.  Mitral Valve: The mitral valve is normal in structure. Mild thickening of the mitral valve leaflet. Mild calcification of the mitral valve leaflet. There is moderate mitral annular calcification present. Mitral valve regurgitation is moderate by color  flow Doppler.  Tricuspid Valve: The tricuspid valve is normal in structure. Tricuspid valve regurgitation is mild by color flow Doppler.  Aortic Valve: The aortic valve is tricuspid Moderate thickening of the aortic valve. Sclerosis without any evidence of stenosis of the aortic valve. Aortic valve regurgitation was not visualized by color flow Doppler. There is no evidence of aortic valve  stenosis.  Pulmonic Valve: The pulmonic valve was grossly normal. Pulmonic valve regurgitation is mild by color flow Doppler.  Aorta: The aortic root is normal in size and structure.   10/13/2018: TEE IMPRESSIONS 1. There is a 2.1 x 0.4 cm lesion seen in the right atrium, attached to the pacemaker  lead (possibly the RA lead). It has a small mobile component. It does not appear calcified. In the setting of bacteremia, concern for infectious vegetation. Cannot  exclude acute thrombus or chronic organized thrombus. 2. The left ventricle has low normal systolic function, with an ejection fraction of 50-55%. The cavity size was normal. 3. The right ventricle has mildly reduced systolic function. The cavity was moderately enlarged. 4. Left atrial size was mildly dilated. 5. No evidence of a thrombus present in the left atrial appendage. 6. There is mild mitral annular calcification present. Mitral valve regurgitation is moderate by color flow Doppler. The MR jet is centrally-directed. No mitral valve vegetation visualized. 7. No tricuspid valve vegetation visualized. 8. No vegetation on the aortic valve. 9. The aortic valve is grossly normal Moderate sclerosis of the aortic valve. Aortic valve regurgitation is trivial by color flow Doppler. The jet is centrally-directed. 10. There is mild dilatation of the ascending aorta measuring 40 mm. 11. There is evidence of mild plaque in the descending aorta.  FINDINGS Left Ventricle: The left ventricle has low normal systolic function, with an ejection fraction of 50-55%. The cavity size was normal. The left ventricular wall thickness was not assessed.  Right Ventricle: The right ventricle has mildly reduced systolic function. The cavity was moderately enlarged. Pacing wire/catheter visualized in the right ventricle.  Left Atrium: Left atrial size was mildly dilated.  Left Atrial Appendage: No evidence of a thrombus present in the left atrial appendage.  Right Atrium: Right atrial size was normal in size. Right atrial pressure is estimated at 0 mmHg.  Interatrial Septum: No atrial level shunt detected by color flow Doppler. Agitated saline contrast was given intravenously to evaluate for intracardiac shunting. Saline contrast bubble  study was negative, with no evidence of any interatrial shunt.  Increased thickness of the atrial septum sparing the fossa ovalis consistent with The interatrial septum appears to be lipomatous.  Pericardium: There is no evidence of pericardial effusion.  Mitral Valve: There is mild mitral annular calcification present. Mitral valve regurgitation is moderate by color flow Doppler. The MR jet is centrally-directed. There is no evidence of mitral valve vegetation.  Tricuspid Valve: The tricuspid valve was grossly normal. Tricuspid valve regurgitation is mild by color flow Doppler. No TV vegetation was visualized.  Aortic Valve: The aortic valve is  grossly normal Moderate sclerosis of the aortic valve. Aortic valve regurgitation is trivial by color flow Doppler. The jet is centrally-directed. There is no evidence of a vegetation on the aortic valve.  Pulmonic Valve: The pulmonic valve was normal in structure. Pulmonic valve regurgitation is trivial by color flow Doppler.  Aorta: There is mild dilatation of the ascending aorta measuring 40 mm. There is evidence of mild plaque in the descending aorta.    10/11/2018 TTE IMPRESSIONS 1. The left ventricle has normal systolic function, with an ejection fraction of 55-60%. The cavity size was normal. There is moderately increased left ventricular wall thickness. Left ventricular diastolic Doppler parameters are indeterminate. 2. Beat to beat variablity in LV function. Image quality suboptimal for the detection of regional wall motion abnormalities. 3. The right ventricle has moderately reduced systolic function. The cavity was normal. There is no increase in right ventricular wall thickness. 4. Left atrial size was mildly dilated. 5. The mitral valve is abnormal. There is mild to moderate mitral annular calcification present. 6. The aortic valve is grossly normal. Moderate sclerosis of the aortic valve. 7. No valvular vegetations noted,  however image quality was suboptimal for the detection small valvular lesions.  FINDINGS Left Ventricle: The left ventricle has normal systolic function, with an ejection fraction of 55-60%. The cavity size was normal. There is moderately increased left ventricular wall thickness. Left ventricular diastolic Doppler parameters are  indeterminate. Beat to beat variablity in LV function. Image quality suboptimal for the detection of regional wall motion abnormalities.  Right Ventricle: The right ventricle has moderately reduced systolic function. The cavity was normal. There is no increase in right ventricular wall thickness. Pacing wire/catheter visualized in the right ventricle.  Left Atrium: Left atrial size was mildly dilated.  Right Atrium: Right atrial size was normal in size. Right atrial pressure is estimated at 10 mmHg.  Interatrial Septum: No atrial level shunt detected by color flow Doppler.  Pericardium: There is no evidence of pericardial effusion.  Mitral Valve: The mitral valve is abnormal. There is mild to moderate mitral annular calcification present. Mitral valve regurgitation is mild by color flow Doppler.  Tricuspid Valve: The tricuspid valve is normal in structure. Tricuspid valve regurgitation is trivial by color flow Doppler.  Aortic Valve: The aortic valve is grossly normal Moderate sclerosis of the aortic valve. Aortic valve regurgitation was not visualized by color flow Doppler.  Pulmonic Valve: The pulmonic valve was not well visualized. Pulmonic valve regurgitation is trivial by color flow Doppler.  Aorta: The aortic root and ascending aorta are normal in size and structure.  Venous: The inferior vena cava was not well visualized.  Patient Profile     63 y.o. 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  He was discharged from this hospitalization to home w/home antibiotics 6.23.2020.  [facility] [place] [facility]  [facility] ago with CP/palpitations noted to have WCT suspected to be 2:1 AFlutter ? If VT, also noted to  Have AFib tachy/brady with RVR and rates to 40's.  He was transferred to [facility]/15/2018 late day for management of these issues, and consideration for PPM implant.  Yesterday AM he developed sustained MMVT 200bpm, initially maintaining BP/mentation after IV lopressor had conversion to SR withing a few minutes recurent sustained and hemodynamically unstable MMVT requiring sedation and shock > marked bradycardia > PEA Had CPR ACLS with quick ROSC Not intubated. Emergent R/L cath with stable CAD (cath report above), placement of temp pacing wire decopmensated HF New reduction in LVEF  To 20-25%  Assessment & Plan    1. Hemodynamically unstable VT     Remains on amiodarone gtt     Has not had further since yesterday AM  Continue amio  [place]   2. New CM, low output/shock     D/w Henry Mcpherson this AM.  Number look better, planned to remove swan this AM and stop dobutamine     Recommends consideration for device implant today if able get temp wire out and allow mobilization of pt    3. Disseminated MRSA infection     Back hardware  remains in place     Feet /toes are dressed, known to have sacral ulcer     Appreciate ID     To continue his vancomycin and rifampin (stop Levaquin with no signs of UTI)     Looks like was to continue to 12/21/2018 by last OP note     Cleared for device implant at our timing  4. AFib, described as permanent     CHA2DS2Vasc is 4, not on a/c 2/2 GIB history 5. Tachy-brady      Temp wire in place at 40bpm      occasional brief pacing.  In review note post shock SR 1st degree AVblock, paces with loss of sinus input        6. CAD     Stable disease     RCA and SVG to RCA chronically occluded  7. Anemia     Down from last labs, though is similar to his labs last admission         Dr. Elberta Mcpherson has seen and examined the patient this AM Discussed care with Henry Mcpherson timing/options of device (PPM, ICD, temp-perm, life vest, SQ) Perhaps Henry Mcpherson plan for temp-perm pacer with life vest until antibiotics completed, picc out, back hardware out/surgeries are finished.  Henry Mcpherson Henry Mcpherson reach out to Henry Mcpherson and Henry Mcpherson.    For questions or updates, please contact [facility] Please consult [email or web address] for contact info under        Signed, Henry Pigeon, PA-C  11/17/2018, 8:03 AM    I have seen and examined this patient with Henry Mcpherson.  Agree with above, note added to reflect my findings.  On exam, irrr, no murmurs, lungs clear.  Patient doing much better today.  Currently without complaint.  He would certainly do well with removal of his temporary wire as well as his PA catheter.  He has been cleared for device implantation by ID, though with continued infection and continued antibiotics, he is still at high risk of device infection.  1 option could be to implant a dual-chamber defibrillator now versus a temporary permanent external device with a LifeVest.  I have discussed this with Henry Mcpherson who Deloras Reichard speak with infectious disease and  heart failure cardiology.  Would continue  amiodarone.  Henry Mcpherson M. Henry Rockwell MD 11/17/2018 8:37 AM

## 2018-11-17 NOTE — Progress Notes (Addendum)
Patient ID: Henry Mcpherson, male   DOB: [birth year], 63 y.o.   MRN: [id number]     Advanced Heart Failure Rounding Note  PCP-Cardiologist: No primary care provider on file.   Subjective:    Feels better this morning.  No dyspnea.  He has not paced any. He is back in atrial fibrillation with rate in 60s.   Swan numbers: CVP 8 PA 39/19 CI 4 Co-ox 72%  Echo: EF 25% with diffuse hypokinesis, normal RV, moderate MR.    Objective:   Weight Range: 84.3 kg Body mass index is 23.86 kg/m.   Vital Signs:   Temp:  [96.6 F (35.9 C)-99 F (37.2 C)] 96.6 F (35.9 C) (07/17 0800) Pulse Rate:  [45-163] 45 (07/17 0800) Resp:  [0-26] 17 (07/17 0800) BP: (71-124)/(38-86) 100/53 (07/17 0800) SpO2:  [96 %-100 %] 96 % (07/17 0800) Weight:  [84.3 kg] 84.3 kg (07/17 0400) Last BM Date: 11/17/18  Weight change: Filed Weights   11/15/18 1800 11/16/18 0644 11/17/18 0400  Weight: 82.2 kg 82 kg 84.3 kg    Intake/Output:   Intake/Output Summary (Last 24 hours) at 11/17/2018 0939 Last data filed at 11/17/2018 0600 Gross per 24 hour  Intake 1465.13 ml  Output 1050 ml  Net 415.13 ml      Physical Exam    General:  Chronically ill-appearing HEENT: Normal Neck: Supple. JVP 8 cm. Carotids 2+ bilat; no bruits. No lymphadenopathy or thyromegaly appreciated. Cor: PMI nondisplaced. Irregular rate & rhythm. No rubs, gallops.  1/6 HSM apex.  Lungs: Clear Abdomen: Soft, nontender, nondistended. No hepatosplenomegaly. No bruits or masses. Good bowel sounds. Extremities: No cyanosis, clubbing, rash. 1+ ankle edema.  Neuro: Alert & orientedx3, cranial nerves grossly intact. moves all 4 extremities w/o difficulty. Affect pleasant   Telemetry   Atrial fibrillation in 60s (personally reviewed)  EKG    Atrial fibrillation, RBBB (personally reviewed)  Labs    CBC Recent Labs    11/16/18 1048 11/17/18 0411  WBC 19.3* 9.0  NEUTROABS 16.2* 6.9  HGB 9.9* 8.3*  HCT 33.6* 27.7*  MCV 93.6  93.0  PLT 307 413   Basic Metabolic Panel Recent Labs    11/16/18 1048 11/17/18 0411  NA 137 137  K 4.0 3.8  CL 104 104  CO2 23 25  GLUCOSE 219* 202*  BUN 23 26*  CREATININE 0.75 0.64  CALCIUM 8.6* 9.1  MG 1.8  --    Liver Function Tests No results for input(s): AST, ALT, ALKPHOS, BILITOT, PROT, ALBUMIN in the last 72 hours. No results for input(s): LIPASE, AMYLASE in the last 72 hours. Cardiac Enzymes No results for input(s): CKTOTAL, CKMB, CKMBINDEX, TROPONINI in the last 72 hours.  BNP: BNP (last 3 results) Recent Labs    10/10/18 2004 10/17/18 0719  BNP 500.2* 231.2*    ProBNP (last 3 results) No results for input(s): PROBNP in the last 8760 hours.   D-Dimer No results for input(s): DDIMER in the last 72 hours. Hemoglobin A1C No results for input(s): HGBA1C in the last 72 hours. Fasting Lipid Panel No results for input(s): CHOL, HDL, LDLCALC, TRIG, CHOLHDL, LDLDIRECT in the last 72 hours. Thyroid Function Tests No results for input(s): TSH, T4TOTAL, T3FREE, THYROIDAB in the last 72 hours.  Invalid input(s): FREET3  Other results:   Imaging    Dg Chest Port 1 View  Result Date: 11/16/2018 CLINICAL DATA:  CHF.  Current smoker. EXAM: PORTABLE CHEST 1 VIEW COMPARISON:  10/21/2018 FINDINGS: Right-sided PICC line and  sternotomy wires unchanged. Catheter from below extending into the region of the proximal right lower lobar pulmonary artery. Small caliber lead projects over the left lower heart. Interval removal of right-sided pleural pigtail drainage catheter. Lungs are hypoinflated with mild interval improvement of right middle lobe opacification likely improving atelectasis. No evidence of effusion or pneumothorax. Mild stable cardiomegaly. Remainder of the exam is unchanged. IMPRESSION: Improving right middle lobe density likely improving atelectasis. Tubes and lines as described. Electronically Signed   By: Elberta Fortis M.D.   On: 11/16/2018 15:20       Medications:     Scheduled Medications: . aspirin  81 mg Oral Daily  . atorvastatin  40 mg Oral q1800  . Chlorhexidine Gluconate Cloth  6 each Topical Daily  . digoxin  0.125 mg Oral Daily  . Gerhardt's butt cream   Topical QID  . heparin injection (subcutaneous)  5,000 Units Subcutaneous Q8H  . insulin aspart  0-15 Units Subcutaneous TID WC  . insulin aspart  0-5 Units Subcutaneous QHS  . insulin detemir  15 Units Subcutaneous QHS  . pantoprazole  40 mg Oral BID  . potassium chloride  40 mEq Oral Once  . rifampin  300 mg Oral Q12H  . saccharomyces boulardii  250 mg Oral BID  . sodium chloride flush  3 mL Intravenous Q12H  . spironolactone  25 mg Oral Daily  . sucralfate  1 g Oral TID WC & HS     Infusions: . sodium chloride    . amiodarone 30 mg/hr (11/17/18 0600)  . vancomycin Stopped (11/16/18 2204)     PRN Medications:  sodium chloride, acetaminophen, albuterol, cyclobenzaprine, Gerhardt's butt cream, metoprolol tartrate, ondansetron (ZOFRAN) IV, oxyCODONE-acetaminophen, sennosides, sodium chloride flush, traMADol   Assessment/Plan   1. 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  LHC showed chronically occluded RCA and SVG-RCA, likely scar-mediated arrhythmia.   - Continue amiodarone gtt for now, probably to po tomorrow.  He was not on amiodarone at home.  - Discussed with EP (Drs Adine Madura, and Camnitz).  Ultimately, would avoid device if he can avoid further bradycardia and use amiodarone.  He is at high risk for re-infecting an implanted device.  2. CAD: Had CABG in 1980s with SVG-RCA.  Cath 7/16 post-VT showed occluded RCA and occluded SVG-RCA.  There were collaterals, so likely chronic.  - Continue ASA 81 and statin.   3. Acute on chronic systolic CHF/cardiogenic shock: Ischemic cardiomyopathy.  Prior TEE in 6/20 showed EF 50-55%.  Echo this admission showed EF 25% with diffuse hypokinesis (worse than would be expected with just RCA occlusion).  Suspect  there was a degree of stunning from the cardiac arrest on 7/16, also he appears to have been in atrial fibrillation with RVR for a period of time when he was in [place], so may be component of tachy-mediated CMP.  Initial Fick CI 1.5 improved to 2.25 on repeat RHC so IABP was not placed.  Today, he is off norepinephrine and on dobutamine 2 with excellent cardiac output and CVP 8.  - Stop dobutamine and repeat co-ox later this morning.  - Can remove Swan.  He now has a PICC line, will follow CVP and co-ox off this.  - No Lasix today.  - Continue spironolactone and add digoxin 0.125 daily.  - HR currently well-controlled in 60s on amiodarone.  4. Atrial fibrillation: Persistent atrial fibrillation, not anticoagulated due to history of GI bleeding from PUD (discussed with patient, multiple episodes including recently).  He was back in NSR post-defibrillation yesterday but is in rate-controlled atrial fibrillation today.  I expect that he will be mainly in atrial fibrillation, we cannot electively cardiovert him without anticoagulation.   - He will continue amiodarone for VT and to rate control atrial fibrillation.  - I suspect that he is not going to be able to be anticoagulated based on his history.  5. Tachy-brady syndrome:  Had been having alternating afib/RVR with bradycardia per report from hospital in [place].  Was bradycardic post-defibrillation of VT 7/16.  However, HR in 60s with atrial fibrillation since temporary wire placed.  As above, would like to avoid device implantation if at all possible per EP due to infection risk. Would watch another 24 hrs or so and if no pacing, remove temporary wire (set to backup pace at 40 bpm).  If we are unsure at that time about pacing need, Dr. Graciela HusbandsKlein has recommended placement of IJ temp-perm device over weekend or Monday to allow mobilization.  6. MRSA bacteremia with multifocal infections: He is currently on vancomycin, ID has been consulted.   7. H/o GI bleeding:  Hemoglobin lower today, no overt bleeding.  - Check Fe studies, can give IV Fe.  8. Spinal osteomyelitis/discitis: Severe, eventually needs to get to another back operation.   CRITICAL CARE Performed by: Marca Anconaalton Esa Raden  Total critical care time: 40 minutes  Critical care time was exclusive of separately billable procedures and treating other patients.  Critical care was necessary to treat or prevent imminent or life-threatening deterioration.  Critical care was time spent personally by me on the following activities: development of treatment plan with patient and/or surrogate as well as nursing, discussions with consultants, evaluation of patient's response to treatment, examination of patient, obtaining history from patient or surrogate, ordering and performing treatments and interventions, ordering and review of laboratory studies, ordering and review of radiographic studies, pulse oximetry and re-evaluation of patient's condition.  Length of Stay: 2  Marca Anconaalton Chealsey Miyamoto, MD  11/17/2018, 9:39 AM  Advanced Heart Failure Team Pager [phone] (M-F; 7a - 4p)  Please contact [facility] for night-coverage after hours (4p -7a ) and weekends on [email or web address]

## 2018-11-17 NOTE — Progress Notes (Signed)
  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

## 2018-11-17 NOTE — Consult Note (Signed)
[facility] Nurse wound consult note Reason for Consult:Stage 2 PI to right buttock, Moisture associated skin damage to buttocks, perianal area, scrotum, medial and posterior thighs. Surgical wound at left Great Toe (amputation site), previously healed areas Wound type: Pressure, Moisture Pressure Injury POA: Yes Measurement: Right buttock, Stage 2 PI (healing): 0.8cm x 2.4cm x 0.2cm with red, moist wound bed, scant serous exudate Coccygeal, buttock and bilateral inguinal areas and penis are with white/grey macerated tissue. Patient's external male catheter becomes dislodged during assessment and he has been incontinent of liquid brown stool. I will discontinue use of the prophylactic silicone foam in this patient to allow for air flow due to maceration and fecal incontinence. I will implement Gerhart's Butt Cream (a compounded 1:1:1 product consisting of zinc oxide, lotrimin and hydrocortisone creams) 4 times daily and PRN stooling. Left Great Toe:  1cm x 2cm x 0.2cm wound healing by secondary intention. Deep red to brown wound bed with contracting edges, no exudate. Right foot, plantar aspect:  3 full thickness wounds that have healed, the largest in the metatarsal head region measures 3cm x 3.2cm and presents with scarring as do the two others in the hindfoot area. Wound bed:As noted above Drainage (amount, consistency, odor) As noted above  Periwound:AS noted above Dressing procedure/placement/frequency: Turn frequently to allow for air flow and application of Gerhart's Butt Cream to buttocks, coccygeal, posterior thigh, scrotal areas. Patient is on a mattress replacement with low air loss feature. Pressure redistribution heel boots to bilateral feet.  Will use xeroform gauze once daily to LGT amputation site, protective silicone foam dressings to right Plantar lesions (healed). Patient is somewhat resistant to turning as he reports long history of sciatica on right. He is medicated, reassured and assisted  by 3 people for today's clean up from fecal and urinary incontinence and subsequent bed linen change.   Patient reports he has a [facility] and it is recommended he continue those services post discharge. If you agree, please order.  [facility] nursing team will not follow, but will remain available to this patient, the nursing and medical teams.  Please re-consult if needed. Thanks, Maudie Flakes, MSN, RN, Ramirez-Perez, Arther Abbott  Pager# [phone]

## 2018-11-17 NOTE — Progress Notes (Signed)
    [facility] for Infectious Disease   Reason for visit: Follow up on MRSA infection  Interval History: no new events, consideration of placement of cardiac device today.  No fever, no chills.  WBC wnl.    Physical Exam: Constitutional:  Vitals:   11/17/18 0700 11/17/18 0800  BP: 103/61 (!) 100/53  Pulse: 62 (!) 45  Resp: 19 17  Temp: (!) 96.8 F (36 C) (!) 96.6 F (35.9 C)  SpO2: 99% 96%   patient appears in NAD Eyes: anicteric HENT: no thrush Respiratory: Normal respiratory effort; CTA B Cardiovascular: RRR GI: soft, nt, nd  Review of Systems: Constitutional: negative for fevers, chills and malaise Gastrointestinal: negative for nausea and diarrhea Integument/breast: negative for rash  Lab Results  Component Value Date   WBC 9.0 11/17/2018   HGB 8.3 (L) 11/17/2018   HCT 27.7 (L) 11/17/2018   MCV 93.0 11/17/2018   PLT 175 11/17/2018    Lab Results  Component Value Date   CREATININE 0.64 11/17/2018   BUN 26 (H) 11/17/2018   NA 137 11/17/2018   K 3.8 11/17/2018   CL 104 11/17/2018   CO2 25 11/17/2018    Lab Results  Component Value Date   ALT 7 10/19/2018   AST 15 10/19/2018   ALKPHOS 99 10/19/2018     Microbiology: Recent Results (from the past 240 hour(s))  MRSA PCR Screening     Status: None   Collection Time: 11/15/18  6:56 PM   Specimen: Nasopharyngeal  Result Value Ref Range Status   MRSA by PCR NEGATIVE NEGATIVE Final    Comment:        The GeneXpert MRSA Assay (FDA approved for NASAL specimens only), is one component of a comprehensive MRSA colonization surveillance program. It is not intended to diagnose MRSA infection nor to guide or monitor treatment for MRSA infections. Performed at [facility], [place] [address], [place], [place] [address]     Impression/Plan:  1. Device implant - from an ID standpoint, no contraindication for reimplantation, though waiting until complete resolution of infection and follow up certainly  not unreasonable/preferable, depending on urgency and effectiveness of temporary measures (vest, etc..) in his particular case.    Regardless of placement of device now or later, from my standpoint, will certainly need be sure to the best of our ability infection is adequately treated before stopping antibiotics which will include a follow up scan of his thoracolumbar spine (CT will be adequate if MRI unable to be done) and follow up CXR/CT of his lungs.   One piece of this if any surgical intervention of his spine by Dr. Saintclair Halsted is planned.  Last note on 6/19 indicated surgery was to be done at some point.  May be worth discussing with him any plans.   2.  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  Discitis/osteomyelitis at T12-L1, L3-4, L5-S1 at site of previous posterior decompression of L3-S1.  Also empyema, device vegetation, s/p removal, previous toe infection s/p amputation.  On IV vancomycin + rifampin at least through August 20  3. Medication monitoring -  Creat has remained stable.

## 2018-11-18 DIAGNOSIS — R001 Bradycardia, unspecified: Secondary | ICD-10-CM

## 2018-11-18 DIAGNOSIS — I5043 Acute on chronic combined systolic (congestive) and diastolic (congestive) heart failure: Secondary | ICD-10-CM

## 2018-11-18 LAB — CBC WITH DIFFERENTIAL/PLATELET
Abs Immature Granulocytes: 0.02 10*3/uL (ref 0.00–0.07)
Basophils Absolute: 0.1 10*3/uL (ref 0.0–0.1)
Basophils Relative: 1 %
Eosinophils Absolute: 0.3 10*3/uL (ref 0.0–0.5)
Eosinophils Relative: 3 %
HCT: 28.7 % — ABNORMAL LOW (ref 39.0–52.0)
Hemoglobin: 8.4 g/dL — ABNORMAL LOW (ref 13.0–17.0)
Immature Granulocytes: 0 %
Lymphocytes Relative: 14 %
Lymphs Abs: 1.1 10*3/uL (ref 0.7–4.0)
MCH: 27.5 pg (ref 26.0–34.0)
MCHC: 29.3 g/dL — ABNORMAL LOW (ref 30.0–36.0)
MCV: 93.8 fL (ref 80.0–100.0)
Monocytes Absolute: 0.5 10*3/uL (ref 0.1–1.0)
Monocytes Relative: 6 %
Neutro Abs: 6.1 10*3/uL (ref 1.7–7.7)
Neutrophils Relative %: 76 %
Platelets: 164 10*3/uL (ref 150–400)
RBC: 3.06 MIL/uL — ABNORMAL LOW (ref 4.22–5.81)
RDW: 19.9 % — ABNORMAL HIGH (ref 11.5–15.5)
WBC: 8.1 10*3/uL (ref 4.0–10.5)
nRBC: 0 % (ref 0.0–0.2)

## 2018-11-18 LAB — BASIC METABOLIC PANEL
Anion gap: 8 (ref 5–15)
BUN: 22 mg/dL (ref 8–23)
CO2: 23 mmol/L (ref 22–32)
Calcium: 8.9 mg/dL (ref 8.9–10.3)
Chloride: 104 mmol/L (ref 98–111)
Creatinine, Ser: 0.61 mg/dL (ref 0.61–1.24)
GFR calc Af Amer: >60 mL/min (ref >60)
GFR calc non Af Amer: >60 mL/min (ref >60)
Glucose, Bld: 200 mg/dL — ABNORMAL HIGH (ref 70–99)
Potassium: 3.9 mmol/L (ref 3.5–5.1)
Sodium: 135 mmol/L (ref 135–145)

## 2018-11-18 LAB — COOXEMETRY PANEL
Carboxyhemoglobin: 2.2 % — ABNORMAL HIGH (ref 0.5–1.5)
Methemoglobin: 0.9 % (ref 0.0–1.5)
O2 Saturation: 82.4 %
Total hemoglobin: 7.7 g/dL — ABNORMAL LOW (ref 12.0–16.0)

## 2018-11-18 LAB — GLUCOSE, CAPILLARY
Glucose-Capillary: 204 mg/dL — ABNORMAL HIGH (ref 70–99)
Glucose-Capillary: 90 mg/dL (ref 70–99)
Glucose-Capillary: 197 mg/dL — ABNORMAL HIGH (ref 70–99)
Glucose-Capillary: 163 mg/dL — ABNORMAL HIGH (ref 70–99)

## 2018-11-18 LAB — MAGNESIUM: Magnesium: 1.8 mg/dL (ref 1.7–2.4)

## 2018-11-18 MED ORDER — AMIODARONE HCL 200 MG PO TABS
200.0000 mg | ORAL_TABLET | Freq: Two times a day (BID) | ORAL | Status: DC
  Administered 2018-11-18 – 2018-11-21 (×7): 200 mg via ORAL
  Filled 2018-11-18 (×8): qty 1

## 2018-11-18 MED ORDER — SODIUM CHLORIDE 0.9 % IV SOLN
510.0000 mg | INTRAVENOUS | Status: AC
  Administered 2018-11-18 – 2018-11-21 (×2): 510 mg via INTRAVENOUS
  Filled 2018-11-18 (×2): qty 17

## 2018-11-18 MED ORDER — SODIUM CHLORIDE 0.9% FLUSH
10.0000 mL | Freq: Two times a day (BID) | INTRAVENOUS | Status: DC
  Administered 2018-11-19 – 2018-11-21 (×4): 10 mL

## 2018-11-18 MED ORDER — FUROSEMIDE 10 MG/ML IJ SOLN
80.0000 mg | Freq: Once | INTRAMUSCULAR | Status: AC
  Administered 2018-11-18: 80 mg via INTRAVENOUS
  Filled 2018-11-18: qty 8

## 2018-11-18 MED ORDER — MAGNESIUM SULFATE 2 GM/50ML IV SOLN
2.0000 g | Freq: Once | INTRAVENOUS | Status: AC
  Administered 2018-11-18: 2 g via INTRAVENOUS
  Filled 2018-11-18: qty 50

## 2018-11-18 MED ORDER — POTASSIUM CHLORIDE CRYS ER 20 MEQ PO TBCR
40.0000 meq | EXTENDED_RELEASE_TABLET | Freq: Once | ORAL | Status: AC
  Administered 2018-11-18: 40 meq via ORAL
  Filled 2018-11-18: qty 2

## 2018-11-18 NOTE — Progress Notes (Signed)
Patient ID: Henry Mcpherson, male   DOB: [birth year], 63 y.o.   MRN: [id number]     Advanced Heart Failure Rounding Note  PCP-Cardiologist: No primary care provider on file.   Subjective:    He is back in NSR this am on IV amio. No pacing on tele  Ernestine Conrad out. Dobutamine off. Co-ox 82% Feels better. Denies CP, SOB, orthopnea. No hypotension. CVP 14. Weight up 2 pounds  Echo: EF 25% with diffuse hypokinesis, normal RV, moderate MR.    Objective:   Weight Range: 86.2 kg Body mass index is 24.4 kg/m.   Vital Signs:   Temp:  [97.6 F (36.4 C)-98.4 F (36.9 C)] 98 F (36.7 C) (07/18 0747) Pulse Rate:  [58-92] 64 (07/18 1000) Resp:  [10-33] 13 (07/18 1000) BP: (81-154)/(54-116) 127/74 (07/18 1000) SpO2:  [79 %-100 %] 100 % (07/18 1000) Weight:  [86.2 kg] 86.2 kg (07/18 0500) Last BM Date: 11/18/18  Weight change: Filed Weights   11/16/18 0644 11/17/18 0400 11/18/18 0500  Weight: 82 kg 84.3 kg 86.2 kg    Intake/Output:   Intake/Output Summary (Last 24 hours) at 11/18/2018 1024 Last data filed at 11/18/2018 0900 Gross per 24 hour  Intake 822.34 ml  Output 1452 ml  Net -629.66 ml      Physical Exam    General:  Lying in bed No resp difficulty HEENT: normal Neck: supple. JVP to jaw Carotids 2+ bilat; no bruits. No lymphadenopathy or thryomegaly appreciated. Cor: PMI nondisplaced. Regular rate & rhythm. Soft MR Lungs: clear Abdomen: soft, nontender, nondistended. No hepatosplenomegaly. No bruits or masses. Good bowel sounds. Extremities: no cyanosis, clubbing, rash, tr edema in boots  RFV temp pacer Neuro: alert & orientedx3, cranial nerves grossly intact. moves all 4 extremities w/o difficulty. Affect pleasant   Telemetry   NSR in 60s no pacing  (personally reviewed)  Labs    CBC Recent Labs    11/17/18 0411 11/18/18 0419  WBC 9.0 8.1  NEUTROABS 6.9 6.1  HGB 8.3* 8.4*  HCT 27.7* 28.7*  MCV 93.0 93.8  PLT 175 164   Basic Metabolic Panel Recent Labs    02/54/27 1048 11/17/18 0411 11/18/18 0419  NA 137 137 135  K 4.0 3.8 3.9  CL 104 104 104  CO2 23 25 23   GLUCOSE 219* 202* 200*  BUN 23 26* 22  CREATININE 0.75 0.64 0.61  CALCIUM 8.6* 9.1 8.9  MG 1.8  --  1.8   Liver Function Tests No results for input(s): AST, ALT, ALKPHOS, BILITOT, PROT, ALBUMIN in the last 72 hours. No results for input(s): LIPASE, AMYLASE in the last 72 hours. Cardiac Enzymes No results for input(s): CKTOTAL, CKMB, CKMBINDEX, TROPONINI in the last 72 hours.  BNP: BNP (last 3 results) Recent Labs    10/10/18 2004 10/17/18 0719  BNP 500.2* 231.2*    ProBNP (last 3 results) No results for input(s): PROBNP in the last 8760 hours.   D-Dimer No results for input(s): DDIMER in the last 72 hours. Hemoglobin A1C No results for input(s): HGBA1C in the last 72 hours. Fasting Lipid Panel No results for input(s): CHOL, HDL, LDLCALC, TRIG, CHOLHDL, LDLDIRECT in the last 72 hours. Thyroid Function Tests No results for input(s): TSH, T4TOTAL, T3FREE, THYROIDAB in the last 72 hours.  Invalid input(s): FREET3  Other results:   Imaging    No results found.   Medications:     Scheduled Medications: . aspirin  81 mg Oral Daily  . atorvastatin  40 mg  Oral q1800  . Chlorhexidine Gluconate Cloth  6 each Topical Daily  . digoxin  0.125 mg Oral Daily  . Gerhardt's butt cream   Topical QID  . heparin injection (subcutaneous)  5,000 Units Subcutaneous Q8H  . insulin aspart  0-15 Units Subcutaneous TID WC  . insulin aspart  0-5 Units Subcutaneous QHS  . insulin detemir  15 Units Subcutaneous QHS  . pantoprazole  40 mg Oral BID  . rifampin  300 mg Oral Q12H  . saccharomyces boulardii  250 mg Oral BID  . sodium chloride flush  3 mL Intravenous Q12H  . spironolactone  25 mg Oral Daily  . sucralfate  1 g Oral TID WC & HS    Infusions: . sodium chloride 250 mL (11/18/18 0936)  . amiodarone 30 mg/hr (11/18/18 0900)  . magnesium sulfate bolus IVPB 2 g  (11/18/18 0936)  . vancomycin Stopped (11/17/18 2035)    PRN Medications: sodium chloride, acetaminophen, albuterol, cyclobenzaprine, Gerhardt's butt cream, metoprolol tartrate, ondansetron (ZOFRAN) IV, oxyCODONE-acetaminophen, sennosides, sodium chloride flush, traMADol   Assessment/Plan   1. 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  LHC showed chronically occluded RCA and SVG-RCA, likely scar-mediated arrhythmia.   - Continue amiodarone gtt for now possibly po soon - Dr. Shirlee LatchMclean has discussed with EP (Drs Johney FrameAllred, Graciela HusbandsKlein, and [facility]).  Ultimately, would avoid device if he can avoid further bradycardia and use amiodarone.  He is at high risk for re-infecting an implanted device. EP continues to follow. Will await further input  2. CAD: Had CABG in 1980s with SVG-RCA.  Cath 7/16 post-VT showed occluded RCA and occluded SVG-RCA.  There were collaterals, so likely chronic.  - No s/s ischemic currently. - Continue ASA 81 and statin.   3. Acute on chronic systolic CHF/cardiogenic shock: Ischemic cardiomyopathy.  Prior TEE in 6/20 showed EF 50-55%.  Echo this admission showed EF 25% with diffuse hypokinesis (worse than would be expected with just RCA occlusion).  Suspect there was a degree of stunning from the cardiac arrest on 7/16, also he appears to have been in atrial fibrillation with RVR for a period of time when he was in [place], so may be component of tachy-mediated CMP.  Initial Fick CI 1.5 improved to 2.25 on repeat RHC so IABP was not placed.  - Dobutamine stopped yesterday. Swan out - Co-ox 82%. CVP 14 - Will give lasix 80 IV x 1and k 40 - Continue spironolactone. - Will stop digoxin with amio and concern over need for pacing. No b-blocker 4. Atrial fibrillation: Persistent atrial fibrillation, not anticoagulated due to history of GI bleeding from PUD (discussed with patient, multiple episodes including recently).   He was back in NSR post-defibrillation but then reverted to AF  yesterday. Now back in NSR on IV amio - I reviewed strips with Dr. Graciela HusbandsKlein felt not to have AF yesterday but just diminutive p-waves. He is clearly in NSR today - He is not felt to [facility] based on his history.  5. Tachy-brady syndrome:  Had been having alternating afib/RVR with bradycardia per report from hospital in [place].  Was bradycardic post-defibrillation of VT 7/16.  However, HR in 60s with atrial fibrillation since temporary wire placed.  As above, would like to avoid device implantation if at all possible per EP due to infection risk. - Has not paced overnight. HR in 60s. D/w Dr. Graciela HusbandsKlein. I will pull TVP at bedside and remove sheath  6. MRSA bacteremia with multifocal infections: He is currently on  vancomycin, ID has been consulted.   7. H/o GI bleeding: Hemoglobin lower today, no overt bleeding.  - Iron sats low at 9%. Give Feraheme  8. Spinal osteomyelitis/discitis: Severe, eventually needs to get to another back operation.  - on vancomycin 9. Hypokalemia/hypomag - will supp  CRITICAL CARE Performed by: Glori Bickers  Total critical care time: 40 minutes  Critical care time was exclusive of separately billable procedures and treating other patients.  Critical care was necessary to treat or prevent imminent or life-threatening deterioration.  Critical care was time spent personally by me on the following activities: development of treatment plan with patient and/or surrogate as well as nursing, discussions with consultants, evaluation of patient's response to treatment, examination of patient, obtaining history from patient or surrogate, ordering and performing treatments and interventions, ordering and review of laboratory studies, ordering and review of radiographic studies, pulse oximetry and re-evaluation of patient's condition.  Length of Stay: 3  Glori Bickers, MD  11/18/2018, 10:24 AM  Advanced Heart Failure Team Pager [phone] (M-F; 7a - 4p)  Please contact [facility] for night-coverage after hours (4p -7a ) and weekends on [email or web address]

## 2018-11-18 NOTE — Progress Notes (Signed)
Pharmacy Antibiotic Note  Henry Mcpherson is a 63 y.o. male admitted on 11/15/2018 from OSH for bradycardia.  He was recently at [facility] for ABx s/p MRSA bacteremia.osteo with back hardware, and vegetation on PPM leads - now removed.   Pharmacy had been consulted for vancomycin and levofloxacin dosing. Cr stable 0.8, WBC slightly elevated post arrest now back wnl, afebrile VT on admit 12 - on vancomycin 750mg  q24hr ( dose he was on here previously with AUC 460 - at goal) OSH started levofloxacin for UTI  - now stopped  Plan: Continue Vancomycin 750mg  q24h x8 weeks - ends 8/20 per ID note Check level next week  Rifampin 300mg  Q12h    Height: 6\' 2"  (188 cm) Weight: 190 lb 0.6 oz (86.2 kg) IBW/kg (Calculated) : 82.2  Temp (24hrs), Avg:97.7 F (36.5 C), Min:96.4 F (35.8 C), Max:98.4 F (36.9 C)  Recent Labs  Lab 11/15/18 1842 11/16/18 1048 11/17/18 0411 11/18/18 0419  WBC  --  19.3* 9.0 8.1  CREATININE  --  0.75 0.64 0.61  VANCOTROUGH 12*  --   --   --     Estimated Creatinine Clearance: 107 mL/min (by C-G formula based on SCr of 0.61 mg/dL).    Allergies  Allergen Reactions  . Warfarin And Related Itching and Rash    Antimicrobials this admission: vanc 750mg  q24 Rifampin 300mg  q12h levoflox 500mg  q24 x3 days   Dose adjustments this admission:   Microbiology results: Hx BCX MRSA  Bonnita Nasuti Pharm.D. CPP, BCPS Clinical Pharmacist [phone] 11/18/2018 8:57 AM

## 2018-11-19 DIAGNOSIS — M4645 Discitis, unspecified, thoracolumbar region: Secondary | ICD-10-CM

## 2018-11-19 LAB — CBC WITH DIFFERENTIAL/PLATELET
Abs Immature Granulocytes: 0.02 10*3/uL (ref 0.00–0.07)
Basophils Absolute: 0.1 10*3/uL (ref 0.0–0.1)
Basophils Relative: 1 %
Eosinophils Absolute: 0.3 10*3/uL (ref 0.0–0.5)
Eosinophils Relative: 4 %
HCT: 28.9 % — ABNORMAL LOW (ref 39.0–52.0)
Hemoglobin: 8.7 g/dL — ABNORMAL LOW (ref 13.0–17.0)
Immature Granulocytes: 0 %
Lymphocytes Relative: 16 %
Lymphs Abs: 1.1 10*3/uL (ref 0.7–4.0)
MCH: 27.7 pg (ref 26.0–34.0)
MCHC: 30.1 g/dL (ref 30.0–36.0)
MCV: 92 fL (ref 80.0–100.0)
Monocytes Absolute: 0.6 10*3/uL (ref 0.1–1.0)
Monocytes Relative: 9 %
Neutro Abs: 4.6 10*3/uL (ref 1.7–7.7)
Neutrophils Relative %: 70 %
Platelets: 167 10*3/uL (ref 150–400)
RBC: 3.14 MIL/uL — ABNORMAL LOW (ref 4.22–5.81)
RDW: 19.6 % — ABNORMAL HIGH (ref 11.5–15.5)
WBC: 6.5 10*3/uL (ref 4.0–10.5)
nRBC: 0 % (ref 0.0–0.2)

## 2018-11-19 LAB — BASIC METABOLIC PANEL
Anion gap: 9 (ref 5–15)
BUN: 20 mg/dL (ref 8–23)
CO2: 24 mmol/L (ref 22–32)
Calcium: 8.9 mg/dL (ref 8.9–10.3)
Chloride: 101 mmol/L (ref 98–111)
Creatinine, Ser: 0.62 mg/dL (ref 0.61–1.24)
GFR calc Af Amer: >60 mL/min (ref >60)
GFR calc non Af Amer: >60 mL/min (ref >60)
Glucose, Bld: 184 mg/dL — ABNORMAL HIGH (ref 70–99)
Potassium: 3.9 mmol/L (ref 3.5–5.1)
Sodium: 134 mmol/L — ABNORMAL LOW (ref 135–145)

## 2018-11-19 LAB — COOXEMETRY PANEL
Carboxyhemoglobin: 2.3 % — ABNORMAL HIGH (ref 0.5–1.5)
Methemoglobin: 1.2 % (ref 0.0–1.5)
O2 Saturation: 66.3 %
Total hemoglobin: 9.1 g/dL — ABNORMAL LOW (ref 12.0–16.0)

## 2018-11-19 LAB — GLUCOSE, CAPILLARY
Glucose-Capillary: 137 mg/dL — ABNORMAL HIGH (ref 70–99)
Glucose-Capillary: 173 mg/dL — ABNORMAL HIGH (ref 70–99)
Glucose-Capillary: 87 mg/dL (ref 70–99)

## 2018-11-19 MED ORDER — ALTEPLASE 2 MG IJ SOLR
2.0000 mg | Freq: Once | INTRAMUSCULAR | Status: AC
  Administered 2018-11-19: 2 mg
  Filled 2018-11-19: qty 2

## 2018-11-19 MED ORDER — LOSARTAN POTASSIUM 25 MG PO TABS
25.0000 mg | ORAL_TABLET | Freq: Every day | ORAL | Status: DC
  Administered 2018-11-19 – 2018-11-20 (×2): 25 mg via ORAL
  Filled 2018-11-19 (×2): qty 1

## 2018-11-19 MED ORDER — FUROSEMIDE 40 MG PO TABS
40.0000 mg | ORAL_TABLET | Freq: Two times a day (BID) | ORAL | Status: DC
  Administered 2018-11-19 – 2018-11-21 (×4): 40 mg via ORAL
  Filled 2018-11-19 (×5): qty 1

## 2018-11-19 NOTE — Progress Notes (Signed)
Patient ID: Henry Mcpherson, male   DOB: Sep 14, 1955, 63 y.o.   MRN: [id number]     Advanced Heart Failure Rounding Note  PCP-Cardiologist: No primary care provider on file.   Subjective:    Remains in NSR on po amio. TVP removed yesterday. No bradycardia overnight.   Co-ox 66% off dobutamine.  Feels better. Denies CP, SOB or orthopnea.  No fevers.  No hypotension.   Rec'd IV lasix yesterday. I/Os negative 3L. Weight recorded as up 10 pounds.   Echo: EF 25% with diffuse hypokinesis, normal RV, moderate MR.    Objective:   Weight Range: 90.8 kg Body mass index is 25.7 kg/m.   Vital Signs:   Temp:  [97.4 F (36.3 C)-98.2 F (36.8 C)] 97.8 F (36.6 C) (07/19 0700) Pulse Rate:  [63-97] 68 (07/19 1100) Resp:  [13-23] 14 (07/19 1100) BP: (99-143)/(59-99) 126/99 (07/19 1100) SpO2:  [98 %-100 %] 100 % (07/19 1100) Weight:  [90.8 kg] 90.8 kg (07/19 0500) Last BM Date: 11/18/18  Weight change: Filed Weights   11/17/18 0400 11/18/18 0500 11/19/18 0500  Weight: 84.3 kg 86.2 kg 90.8 kg    Intake/Output:   Intake/Output Summary (Last 24 hours) at 11/19/2018 1125 Last data filed at 11/19/2018 1100 Gross per 24 hour  Intake 941.08 ml  Output 4300 ml  Net -3358.92 ml      Physical Exam    General:  Sitting up in bed. No resp difficulty HEENT: normal Neck: supple. JVP 8  Carotids 2+ bilat; no bruits. No lymphadenopathy or thryomegaly appreciated. Cor: PMI nondisplaced. Regular rate & rhythm. No rubs, gallops or murmurs. Lungs: clear Abdomen: soft, nontender, nondistended. No hepatosplenomegaly. No bruits or masses. Good bowel sounds. Extremities: no cyanosis, clubbing, rash, tr edema boots on Neuro: alert & orientedx3, cranial nerves grossly intact. moves all 4 extremities w/o difficulty. Affect pleasant   Telemetry   NSR currently in 60-70s with PACs at times it looks like possible AF but likely NSR with PACs (personally reviewed)  Labs    CBC Recent Labs   11/18/18 0419 11/19/18 0500  WBC 8.1 6.5  NEUTROABS 6.1 4.6  HGB 8.4* 8.7*  HCT 28.7* 28.9*  MCV 93.8 92.0  PLT 164 167   Basic Metabolic Panel Recent Labs    27/06/23 0419 11/19/18 0500  NA 135 134*  K 3.9 3.9  CL 104 101  CO2 23 24  GLUCOSE 200* 184*  BUN 22 20  CREATININE 0.61 0.62  CALCIUM 8.9 8.9  MG 1.8  --    Liver Function Tests No results for input(s): AST, ALT, ALKPHOS, BILITOT, PROT, ALBUMIN in the last 72 hours. No results for input(s): LIPASE, AMYLASE in the last 72 hours. Cardiac Enzymes No results for input(s): CKTOTAL, CKMB, CKMBINDEX, TROPONINI in the last 72 hours.  BNP: BNP (last 3 results) Recent Labs    10/10/18 2004 10/17/18 0719  BNP 500.2* 231.2*    ProBNP (last 3 results) No results for input(s): PROBNP in the last 8760 hours.   D-Dimer No results for input(s): DDIMER in the last 72 hours. Hemoglobin A1C No results for input(s): HGBA1C in the last 72 hours. Fasting Lipid Panel No results for input(s): CHOL, HDL, LDLCALC, TRIG, CHOLHDL, LDLDIRECT in the last 72 hours. Thyroid Function Tests No results for input(s): TSH, T4TOTAL, T3FREE, THYROIDAB in the last 72 hours.  Invalid input(s): FREET3  Other results:   Imaging    No results found.   Medications:     Scheduled Medications: .  amiodarone  200 mg Oral BID  . aspirin  81 mg Oral Daily  . atorvastatin  40 mg Oral q1800  . Chlorhexidine Gluconate Cloth  6 each Topical Daily  . Gerhardt's butt cream   Topical QID  . heparin injection (subcutaneous)  5,000 Units Subcutaneous Q8H  . insulin aspart  0-15 Units Subcutaneous TID WC  . insulin aspart  0-5 Units Subcutaneous QHS  . insulin detemir  15 Units Subcutaneous QHS  . pantoprazole  40 mg Oral BID  . rifampin  300 mg Oral Q12H  . saccharomyces boulardii  250 mg Oral BID  . sodium chloride flush  10-40 mL Intracatheter Q12H  . spironolactone  25 mg Oral Daily  . sucralfate  1 g Oral TID WC & HS    Infusions:  . sodium chloride 10 mL/hr at 11/19/18 1100  . ferumoxytol Stopped (11/18/18 1431)  . vancomycin Stopped (11/18/18 2048)    PRN Medications: sodium chloride, acetaminophen, albuterol, cyclobenzaprine, Gerhardt's butt cream, metoprolol tartrate, ondansetron (ZOFRAN) IV, oxyCODONE-acetaminophen, sennosides, traMADol   Assessment/Plan   1. 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  LHC showed chronically occluded RCA and SVG-RCA, likely scar-mediated arrhythmia.   - Quiescent on po amio  - Discussed with EP.  Ultimately, would avoid replacing PPM/ICD due to persistent staph infection.   2. CAD: Had CABG in 1980s with SVG-RCA.   - Cath 7/16 post-VT showed occluded RCA and occluded SVG-RCA.  There were collaterals, so likely chronic.  - No s/s ischemia currently. - Continue ASA 81 and statin.   3. Acute on chronic systolic CHF/cardiogenic shock: Ischemic cardiomyopathy.  Prior TEE in 6/20 showed EF 50-55%.  Echo this admission showed EF 25% with diffuse hypokinesis (worse than would be expected with just RCA occlusion).  Suspect there was a degree of stunning from the cardiac arrest on 7/16, also he appears to have been in atrial fibrillation with RVR for a period of time when he was in [place], so may be component of tachy-mediated CMP.  Initial Fick CI 1.5 improved to 2.25 on repeat RHC so IABP was not placed.  - Dobutamine stopped 7/17.  - Co-ox 66%. CVP down to 8  - Resume home lasix 40 po bid - Continue spironolactone. - Start low-dose losartan - Digoxin stopped with amio and concern over need for pacing. No b-blocker or CCB 4. Atrial fibrillation: Persistent atrial fibrillation, not anticoagulated due to history of GI bleeding from PUD (discussed with patient, multiple episodes including recently).   He was back in NSR post-defibrillation but then reverted to AF yesterday. Now back in NSR on IV amio - I reviewed strips with Dr. Caryl Comes felt not to have AF but just diminutive  p-waves. He is clearly in NSR today - He is not felt to be [facility] candidate based on his history.  5. Tachy-brady syndrome:  Had been having alternating afib/RVR with bradycardia per report from hospital in [place].  Was bradycardic post-defibrillation of VT 7/16.  However, HR in 60s with atrial fibrillation since temporary wire placed.  As above, would like to avoid device implantation if at all possible per EP due to infection risk. - TVP out No bradycardia overnight. Did have brief 2 second pause 6. MRSA bacteremia with multifocal infections: He is currently on vancomycin and rifampin as outpatient. Continued here.  ID has been consulted.   7. H/o GI bleeding: Hemoglobin lower today, no overt bleeding.  - Iron sats low at 9%. Rec'd Feraheme 7/18 8. Spinal osteomyelitis/discitis:  Severe, eventually needs to get to another back operation.  - on vancomycin 9. Hypokalemia/hypomag - will supp  Can go to floor.   Length of Stay: 4  Arvilla Meresaniel Vanetta Rule, MD  11/19/2018, 11:25 AM  Advanced Heart Failure Team Pager [phone] (M-F; 7a - 4p)  Please contact [facility] for night-coverage after hours (4p -7a ) and weekends on [email or web address]

## 2018-11-20 DIAGNOSIS — I4891 Unspecified atrial fibrillation: Secondary | ICD-10-CM

## 2018-11-20 LAB — CBC WITH DIFFERENTIAL/PLATELET
Abs Immature Granulocytes: 0.03 10*3/uL (ref 0.00–0.07)
Basophils Absolute: 0.1 10*3/uL (ref 0.0–0.1)
Basophils Relative: 1 %
Eosinophils Absolute: 0.2 10*3/uL (ref 0.0–0.5)
Eosinophils Relative: 4 %
HCT: 29.4 % — ABNORMAL LOW (ref 39.0–52.0)
Hemoglobin: 8.7 g/dL — ABNORMAL LOW (ref 13.0–17.0)
Immature Granulocytes: 0 %
Lymphocytes Relative: 16 %
Lymphs Abs: 1.1 10*3/uL (ref 0.7–4.0)
MCH: 27.1 pg (ref 26.0–34.0)
MCHC: 29.6 g/dL — ABNORMAL LOW (ref 30.0–36.0)
MCV: 91.6 fL (ref 80.0–100.0)
Monocytes Absolute: 0.5 10*3/uL (ref 0.1–1.0)
Monocytes Relative: 8 %
Neutro Abs: 4.9 10*3/uL (ref 1.7–7.7)
Neutrophils Relative %: 71 %
Platelets: 194 10*3/uL (ref 150–400)
RBC: 3.21 MIL/uL — ABNORMAL LOW (ref 4.22–5.81)
RDW: 19.5 % — ABNORMAL HIGH (ref 11.5–15.5)
WBC: 6.9 10*3/uL (ref 4.0–10.5)
nRBC: 0 % (ref 0.0–0.2)

## 2018-11-20 LAB — BASIC METABOLIC PANEL
Anion gap: 9 (ref 5–15)
BUN: 16 mg/dL (ref 8–23)
CO2: 27 mmol/L (ref 22–32)
Calcium: 9.2 mg/dL (ref 8.9–10.3)
Chloride: 102 mmol/L (ref 98–111)
Creatinine, Ser: 0.71 mg/dL (ref 0.61–1.24)
GFR calc Af Amer: >60 mL/min (ref >60)
GFR calc non Af Amer: >60 mL/min (ref >60)
Glucose, Bld: 80 mg/dL (ref 70–99)
Potassium: 3.3 mmol/L — ABNORMAL LOW (ref 3.5–5.1)
Sodium: 138 mmol/L (ref 135–145)

## 2018-11-20 LAB — COOXEMETRY PANEL
Carboxyhemoglobin: 2.5 % — ABNORMAL HIGH (ref 0.5–1.5)
Methemoglobin: 1.1 % (ref 0.0–1.5)
O2 Saturation: 74.2 %
Total hemoglobin: 9.1 g/dL — ABNORMAL LOW (ref 12.0–16.0)

## 2018-11-20 LAB — GLUCOSE, CAPILLARY
Glucose-Capillary: 155 mg/dL — ABNORMAL HIGH (ref 70–99)
Glucose-Capillary: 182 mg/dL — ABNORMAL HIGH (ref 70–99)
Glucose-Capillary: 192 mg/dL — ABNORMAL HIGH (ref 70–99)
Glucose-Capillary: 69 mg/dL — ABNORMAL LOW (ref 70–99)

## 2018-11-20 LAB — VANCOMYCIN, TROUGH: Vancomycin Tr: 15 ug/mL (ref 15–20)

## 2018-11-20 LAB — MAGNESIUM: Magnesium: 1.7 mg/dL (ref 1.7–2.4)

## 2018-11-20 MED ORDER — MAGNESIUM SULFATE 2 GM/50ML IV SOLN
2.0000 g | Freq: Once | INTRAVENOUS | Status: AC
  Administered 2018-11-20: 2 g via INTRAVENOUS
  Filled 2018-11-20: qty 50

## 2018-11-20 MED ORDER — POTASSIUM CHLORIDE CRYS ER 20 MEQ PO TBCR
40.0000 meq | EXTENDED_RELEASE_TABLET | Freq: Once | ORAL | Status: AC
  Administered 2018-11-20: 08:00:00 40 meq via ORAL
  Filled 2018-11-20: qty 2

## 2018-11-20 MED ORDER — LOSARTAN POTASSIUM 25 MG PO TABS
25.0000 mg | ORAL_TABLET | Freq: Two times a day (BID) | ORAL | Status: DC
  Administered 2018-11-20 – 2018-11-21 (×2): 25 mg via ORAL
  Filled 2018-11-20 (×3): qty 1

## 2018-11-20 NOTE — Progress Notes (Addendum)
Progress Note  Patient Name: Henry Mcpherson Date of Encounter: 11/20/2018  Primary Cardiologist: [facility] Locally Dr. Johney FrameAllred  Subjective   Back pain is at his baseline, feeling much better then yesterday otherwise.  No CP, no SOB  Inpatient Medications    Scheduled Meds:  amiodarone  200 mg Oral BID   aspirin  81 mg Oral Daily   atorvastatin  40 mg Oral q1800   Chlorhexidine Gluconate Cloth  6 each Topical Daily   furosemide  40 mg Oral BID   Gerhardt's butt cream   Topical QID   heparin injection (subcutaneous)  5,000 Units Subcutaneous Q8H   insulin aspart  0-15 Units Subcutaneous TID WC   insulin aspart  0-5 Units Subcutaneous QHS   insulin detemir  15 Units Subcutaneous QHS   losartan  25 mg Oral Daily   pantoprazole  40 mg Oral BID   potassium chloride  40 mEq Oral Once   rifampin  300 mg Oral Q12H   saccharomyces boulardii  250 mg Oral BID   sodium chloride flush  10-40 mL Intracatheter Q12H   spironolactone  25 mg Oral Daily   sucralfate  1 g Oral TID WC & HS   Continuous Infusions:  sodium chloride 10 mL/hr at 11/19/18 1400   ferumoxytol Stopped (11/18/18 1431)   magnesium sulfate bolus IVPB     vancomycin 750 mg (11/19/18 2122)   PRN Meds: sodium chloride, acetaminophen, albuterol, cyclobenzaprine, Gerhardt's butt cream, metoprolol tartrate, ondansetron (ZOFRAN) IV, oxyCODONE-acetaminophen, sennosides, traMADol   Vital Signs    Vitals:   11/19/18 1600 11/19/18 1945 11/20/18 0000 11/20/18 0300  BP: 128/77 138/65 (!) 147/43 (!) 152/86  Pulse: 63 71 82 82  Resp: 16 14  16   Temp: 98.1 F (36.7 C) 98.1 F (36.7 C) 98 F (36.7 C) 98.5 F (36.9 C)  TempSrc: Oral Oral Oral Oral  SpO2: 98% 97%  98%  Weight:      Height:        Intake/Output Summary (Last 24 hours) at 11/20/2018 0749 Last data filed at 11/20/2018 0037 Gross per 24 hour  Intake 977.38 ml  Output 2000 ml  Net -1022.62 ml   Last 3 Weights  11/19/2018 11/18/2018 11/17/2018  Weight (lbs) 200 lb 2.8 oz 190 lb 0.6 oz 185 lb 13.6 oz  Weight (kg) 90.8 kg 86.2 kg 84.3 kg      Telemetry    SR, 1st AVBlock, occ V pacing, PAFib, no tachy, no VT - Personally Reviewed  ECG    Looks SR, 1st degree AVblock, RBBB, PACs - Personally Reviewed  Physical Exam   GEN: No acute distress, appears chronically ill.   Neck: No JVD Cardiac: RRR, no murmurs, rubs, or gallops.  Respiratory: CTA b/l (ant/lat auscultation only). GI: Soft, nontender, non-distended  MS: chronic looking skin changes, dressings b/l toes. Neuro:  Nonfocal  Psych: Normal affect, very pleasent  Labs    High Sensitivity Troponin:  No results for input(s): TROPONINIHS in the last 720 hours.    Cardiac EnzymesNo results for input(s): TROPONINI in the last 168 hours. No results for input(s): TROPIPOC in the last 168 hours.   Chemistry Recent Labs  Lab 11/18/18 0419 11/19/18 0500 11/20/18 0500  NA 135 134* 138  K 3.9 3.9 3.3*  CL 104 101 102  CO2 23 24 27   GLUCOSE 200* 184* 80  BUN 22 20 16   CREATININE 0.61 0.62 0.71  CALCIUM 8.9 8.9 9.2  GFRNONAA >  60 >60 >60  GFRAA >60 >60 >60  ANIONGAP 8 9 9      Hematology Recent Labs  Lab 11/18/18 0419 11/19/18 0500 11/20/18 0500  WBC 8.1 6.5 6.9  RBC 3.06* 3.14* 3.21*  HGB 8.4* 8.7* 8.7*  HCT 28.7* 28.9* 29.4*  MCV 93.8 92.0 91.6  MCH 27.5 27.7 27.1  MCHC 29.3* 30.1 29.6*  RDW 19.9* 19.6* 19.5*  PLT 164 167 194    BNPNo results for input(s): BNP, PROBNP in the last 168 hours.   DDimer No results for input(s): DDIMER in the last 168 hours.   Radiology    Dg Chest Port 1 View Result Date: 11/16/2018 CLINICAL DATA:  CHF.  Current smoker. EXAM: PORTABLE CHEST 1 VIEW COMPARISON:  10/21/2018 FINDINGS: Right-sided PICC line and sternotomy wires unchanged. Catheter from below extending into the region of the proximal right lower lobar pulmonary artery. Small caliber lead projects over the left lower heart.  Interval removal of right-sided pleural pigtail drainage catheter. Lungs are hypoinflated with mild interval improvement of right middle lobe opacification likely improving atelectasis. No evidence of effusion or pneumothorax. Mild stable cardiomegaly. Remainder of the exam is unchanged. IMPRESSION: Improving right middle lobe density likely improving atelectasis. Tubes and lines as described. Electronically Signed   By: Marin Olp M.D.   On: 11/16/2018 15:20    Cardiac Studies   11/16/2018: R/LHC  Mid LAD lesion is 30% stenosed.  Ost RCA to Dist RCA lesion is 100% stenosed.  SVG graft was visualized by angiography.  Origin lesion is 100% stenosed.  There is severe left ventricular systolic dysfunction.  LV end diastolic pressure is normal.  The left ventricular ejection fraction is less than 25% by visual estimate.   1. Single vessel occlusive CAD. The RCA is occluded with left to right collaterals. 2. Occluded SVG to RCA 3. Severe LV dysfunction 4. Severely reduced cardiac output 5. Normal right heart and LV filling pressures. 6. Successful placement of temporary transvenous pacemaker.  Plan: Further plans per [facility] failure and EP teams.     11/16/2018: TTE IMPRESSIONS 1. The left ventricle has a visually estimated ejection fraction of 25%. The cavity size was normal. Left ventricular diastolic parameters were normal. Left ventrical global hypokinesis without regional wall motion abnormalities.  2. The right ventricle has normal systolic function. The cavity was normal. There is no increase in right ventricular wall thickness. Right ventricular systolic pressure is normal with an estimated pressure of 39.2 mmHg.  3. Pacing wires in RA/RV.  4. Left atrial size was severely dilated.  5. Mild thickening of the mitral valve leaflet. Mild calcification of the mitral valve leaflet. There is moderate mitral annular calcification present. Mitral valve regurgitation is  moderate by color flow Doppler.  6. The aortic valve is tricuspid. Moderate thickening of the aortic valve. Sclerosis without any evidence of stenosis of the aortic valve.  7. The aortic root is normal in size and structure.  FINDINGS  Left Ventricle: The left ventricle has a visually estimated ejection fraction of 25. The cavity size was normal. There is no increase in left ventricular wall thickness. Left ventricular diastolic parameters were normal. Left ventrical global  hypokinesis without regional wall motion abnormalities.  Right Ventricle: The right ventricle has normal systolic function. The cavity was normal. There is no increase in right ventricular wall thickness. Right ventricular systolic pressure is normal with an estimated pressure of 39.2 mmHg. Pacing wires in  RA/RV.  Left Atrium: Left atrial size was  severely dilated.  Right Atrium: Right atrial size was normal in size. Right atrial pressure is estimated at 15 mmHg.  Interatrial Septum: No atrial level shunt detected by color flow Doppler.  Pericardium: There is no evidence of pericardial effusion.  Mitral Valve: The mitral valve is normal in structure. Mild thickening of the mitral valve leaflet. Mild calcification of the mitral valve leaflet. There is moderate mitral annular calcification present. Mitral valve regurgitation is moderate by color  flow Doppler.  Tricuspid Valve: The tricuspid valve is normal in structure. Tricuspid valve regurgitation is mild by color flow Doppler.  Aortic Valve: The aortic valve is tricuspid Moderate thickening of the aortic valve. Sclerosis without any evidence of stenosis of the aortic valve. Aortic valve regurgitation was not visualized by color flow Doppler. There is no evidence of aortic valve  stenosis.  Pulmonic Valve: The pulmonic valve was grossly normal. Pulmonic valve regurgitation is mild by color flow Doppler.  Aorta: The aortic root is normal in size and  structure.   10/13/2018: TEE IMPRESSIONS 1. There is a 2.1 x 0.4 cm lesion seen in the right atrium, attached to the pacemaker lead (possibly the RA lead). It has a small mobile component. It does not appear calcified. In the setting of bacteremia, concern for infectious vegetation. Cannot  exclude acute thrombus or chronic organized thrombus. 2. The left ventricle has low normal systolic function, with an ejection fraction of 50-55%. The cavity size was normal. 3. The right ventricle has mildly reduced systolic function. The cavity was moderately enlarged. 4. Left atrial size was mildly dilated. 5. No evidence of a thrombus present in the left atrial appendage. 6. There is mild mitral annular calcification present. Mitral valve regurgitation is moderate by color flow Doppler. The MR jet is centrally-directed. No mitral valve vegetation visualized. 7. No tricuspid valve vegetation visualized. 8. No vegetation on the aortic valve. 9. The aortic valve is grossly normal Moderate sclerosis of the aortic valve. Aortic valve regurgitation is trivial by color flow Doppler. The jet is centrally-directed. 10. There is mild dilatation of the ascending aorta measuring 40 mm. 11. There is evidence of mild plaque in the descending aorta.  FINDINGS Left Ventricle: The left ventricle has low normal systolic function, with an ejection fraction of 50-55%. The cavity size was normal. The left ventricular wall thickness was not assessed.  Right Ventricle: The right ventricle has mildly reduced systolic function. The cavity was moderately enlarged. Pacing wire/catheter visualized in the right ventricle.  Left Atrium: Left atrial size was mildly dilated.  Left Atrial Appendage: No evidence of a thrombus present in the left atrial appendage.  Right Atrium: Right atrial size was normal in size. Right atrial pressure is estimated at 0 mmHg.  Interatrial Septum: No atrial level shunt detected by  color flow Doppler. Agitated saline contrast was given intravenously to evaluate for intracardiac shunting. Saline contrast bubble study was negative, with no evidence of any interatrial shunt.  Increased thickness of the atrial septum sparing the fossa ovalis consistent with The interatrial septum appears to be lipomatous.  Pericardium: There is no evidence of pericardial effusion.  Mitral Valve: There is mild mitral annular calcification present. Mitral valve regurgitation is moderate by color flow Doppler. The MR jet is centrally-directed. There is no evidence of mitral valve vegetation.  Tricuspid Valve: The tricuspid valve was grossly normal. Tricuspid valve regurgitation is mild by color flow Doppler. No TV vegetation was visualized.  Aortic Valve: The aortic valve is grossly normal Moderate  sclerosis of the aortic valve. Aortic valve regurgitation is trivial by color flow Doppler. The jet is centrally-directed. There is no evidence of a vegetation on the aortic valve.  Pulmonic Valve: The pulmonic valve was normal in structure. Pulmonic valve regurgitation is trivial by color flow Doppler.  Aorta: There is mild dilatation of the ascending aorta measuring 40 mm. There is evidence of mild plaque in the descending aorta.    10/11/2018 TTE IMPRESSIONS 1. The left ventricle has normal systolic function, with an ejection fraction of 55-60%. The cavity size was normal. There is moderately increased left ventricular wall thickness. Left ventricular diastolic Doppler parameters are indeterminate. 2. Beat to beat variablity in LV function. Image quality suboptimal for the detection of regional wall motion abnormalities. 3. The right ventricle has moderately reduced systolic function. The cavity was normal. There is no increase in right ventricular wall thickness. 4. Left atrial size was mildly dilated. 5. The mitral valve is abnormal. There is mild to moderate mitral annular  calcification present. 6. The aortic valve is grossly normal. Moderate sclerosis of the aortic valve. 7. No valvular vegetations noted, however image quality was suboptimal for the detection small valvular lesions.  FINDINGS Left Ventricle: The left ventricle has normal systolic function, with an ejection fraction of 55-60%. The cavity size was normal. There is moderately increased left ventricular wall thickness. Left ventricular diastolic Doppler parameters are  indeterminate. Beat to beat variablity in LV function. Image quality suboptimal for the detection of regional wall motion abnormalities.  Right Ventricle: The right ventricle has moderately reduced systolic function. The cavity was normal. There is no increase in right ventricular wall thickness. Pacing wire/catheter visualized in the right ventricle.  Left Atrium: Left atrial size was mildly dilated.  Right Atrium: Right atrial size was normal in size. Right atrial pressure is estimated at 10 mmHg.  Interatrial Septum: No atrial level shunt detected by color flow Doppler.  Pericardium: There is no evidence of pericardial effusion.  Mitral Valve: The mitral valve is abnormal. There is mild to moderate mitral annular calcification present. Mitral valve regurgitation is mild by color flow Doppler.  Tricuspid Valve: The tricuspid valve is normal in structure. Tricuspid valve regurgitation is trivial by color flow Doppler.  Aortic Valve: The aortic valve is grossly normal Moderate sclerosis of the aortic valve. Aortic valve regurgitation was not visualized by color flow Doppler.  Pulmonic Valve: The pulmonic valve was not well visualized. Pulmonic valve regurgitation is trivial by color flow Doppler.  Aorta: The aortic root and ascending aorta are normal in size and structure.  Venous: The inferior vena cava was not well visualized.  Patient Profile     63 y.o. 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  He was discharged from this hospitalization to home w/home antibiotics 6.23.2020.  Admitted to [facility] in [place] a few days ago  with CP/palpitations noted to have WCT suspected to be 2:1 AFlutter ? If VT, also noted to  Have AFib tachy/brady with RVR and rates to 40's.  [facility] day for management of these issues, and consideration for PPM implant.  11/17/2018 AM he developed sustained MMVT 200bpm, initially maintaining BP/mentation after IV lopressor had conversion to SR withing a few minutes recurent sustained and hemodynamically unstable MMVT requiring sedation and shock > marked bradycardia > PEA Had CPR ACLS with quick ROSC Not intubated. Emergent R/L cath with stable CAD (cath report above), placement of temp pacing wire decopmensated HF New reduction in LVEF  To 20-25%  Assessment & Plan    1. Hemodynamically unstable VT, 11/17/2018     transitioned to PO amiodarone 7/18, 200mg  BID     No further VT  today       2. New CM, low output/shock     Off dobutamine Friday, swan was removed as well     Coox 74 this AM     Continue with AHF team    3. Disseminated MRSA  infection     Back hardware remains in place     Feet /toes are dressed, known to have sacral ulcer     Appreciate ID     To continue his vancomycin and rifampin (stop Levaquin with no signs of UTI)     Looks like was to continue to 12/21/2018 at least     Cleared (cautiously) for device implant at our timing    4. AFib, described as permanent     CHA2DS2Vasc is 4, not on a/c 2/2 recurrent GIB history 5. Tachy-brady      Temp wire out over the weekend      No bradycardia   He has been paroxysmal here, between sinus and his AF, 70's-110's         6. CAD     Stable disease     RCA and SVG to RCA chronically occluded    7. Anemia     Down from last labs, though is similar to his labs last admission     H/H stable        Dr. Ladona Ridgelaylor has seen and examined the patient, would like to monitor another day or 2, likely plan for life vest Would like Dr. Wynetta Emeryram to weigh in on plans/thoughts regarding back.  For questions or updates, please contact [facility] Please consult [email or web address] for contact info under   Signed, Sheilah PigeonRenee Lynn Ursuy, PA-C  11/20/2018, 7:49 AM    EP Attending  Patient seen and examined. Agree with the findings as documented as above. He has improved markedly over the past 4 days. I suspect that the severe LV dysfunction seen was due to myocardial stunning from prolonged sustained monomorphic VT. He was in cardiogenic shock last week with a Co-ox of 38. He is now normal and all pressors have been removed. With regard to his atrial fib, he appears to be mostly in NSR with a lot of atrial ectopy. I discussed the case with Dr. Wynetta Emeryram today. He will see tomorrow and provide us with a neurosurgical plan. I think his surgical risk at this point is moderate but acceptable if he remains in his current state. As an outpatient he will require a Life Vest. Once his back has healed, we will consider placement of an ICD.  Leonia ReevesGregg Zenya Hickam,M.D.

## 2018-11-20 NOTE — Progress Notes (Addendum)
Patient ID: Henry Mcpherson, male   DOB: 28-Nov-1955, 63 y.o.   MRN: [id number]     Advanced Heart Failure Rounding Note  PCP-Cardiologist: No primary care provider on file.   Subjective:    Yesterday po lasix and losartan started.   Wants to go home. Denies SOB. Says he has lots of help at home with [facility] and an aide.   No bradycardia or VT on monitor.    Echo: EF 25% with diffuse hypokinesis, normal RV, moderate MR.    Objective:   Weight Range: 90.8 kg Body mass index is 25.7 kg/m.   Vital Signs:   Temp:  [98 F (36.7 C)-98.5 F (36.9 C)] 98.5 F (36.9 C) (07/20 0300) Pulse Rate:  [63-100] 100 (07/20 0806) Resp:  [14-25] 20 (07/20 0806) BP: (99-154)/(43-99) 142/72 (07/20 0806) SpO2:  [97 %-100 %] 100 % (07/20 0806) Last BM Date: 11/19/18  Weight change: Filed Weights   11/17/18 0400 11/18/18 0500 11/19/18 0500  Weight: 84.3 kg 86.2 kg 90.8 kg    Intake/Output:   Intake/Output Summary (Last 24 hours) at 11/20/2018 0841 Last data filed at 11/20/2018 0037 Gross per 24 hour  Intake 977.38 ml  Output 2000 ml  Net -1022.62 ml      Physical Exam   CVP 6-7 personally checked.  General:   No resp difficulty HEENT: normal Neck: supple. JVP 6-7 . Carotids 2+ bilat; no bruits. No lymphadenopathy or thryomegaly appreciated. Cor: PMI nondisplaced. Regular rate & rhythm. No rubs, gallops or murmurs. Lungs: clear Abdomen: soft, nontender, nondistended. No hepatosplenomegaly. No bruits or masses. Good bowel sounds. Extremities: no cyanosis, clubbing, rash, LLE 1+ RLE trace edema. R and L foot dressing.  Neuro: alert & orientedx3, cranial nerves grossly intact. moves all 4 extremities w/o difficulty. Affect pleasant  General:  Sitting up in bed. No resp difficulty HEENT: normal Neck: supple. JVP 8  Carotids 2+ bilat; no bruits. No lymphadenopathy or thryomegaly appreciated. Cor: PMI nondisplaced. Irregular  rate & rhythm. No rubs, gallops or murmurs. Lungs: clear  Abdomen: soft, nontender, nondistended. No hepatosplenomegaly. No bruits or masses. Good bowel sounds. Extremities: no cyanosis, clubbing, rash, tr edema chronic venous stasis changes s/p transmet amputation on R Neuro: alert & orientedx3, cranial nerves grossly intact. moves all 4 extremities w/o difficulty. Affect pleasant   Telemetry   NSR 90-100s with some missed beats.   Labs    CBC Recent Labs    11/19/18 0500 11/20/18 0500  WBC 6.5 6.9  NEUTROABS 4.6 4.9  HGB 8.7* 8.7*  HCT 28.9* 29.4*  MCV 92.0 91.6  PLT 167 194   Basic Metabolic Panel Recent Labs    81/19/14 0419 11/19/18 0500 11/20/18 0500  NA 135 134* 138  K 3.9 3.9 3.3*  CL 104 101 102  CO2 23 24 27   GLUCOSE 200* 184* 80  BUN 22 20 16   CREATININE 0.61 0.62 0.71  CALCIUM 8.9 8.9 9.2  MG 1.8  --  1.7   Liver Function Tests No results for input(s): AST, ALT, ALKPHOS, BILITOT, PROT, ALBUMIN in the last 72 hours. No results for input(s): LIPASE, AMYLASE in the last 72 hours. Cardiac Enzymes No results for input(s): CKTOTAL, CKMB, CKMBINDEX, TROPONINI in the last 72 hours.  BNP: BNP (last 3 results) Recent Labs    10/10/18 2004 10/17/18 0719  BNP 500.2* 231.2*    ProBNP (last 3 results) No results for input(s): PROBNP in the last 8760 hours.   D-Dimer No results for input(s): DDIMER  in the last 72 hours. Hemoglobin A1C No results for input(s): HGBA1C in the last 72 hours. Fasting Lipid Panel No results for input(s): CHOL, HDL, LDLCALC, TRIG, CHOLHDL, LDLDIRECT in the last 72 hours. Thyroid Function Tests No results for input(s): TSH, T4TOTAL, T3FREE, THYROIDAB in the last 72 hours.  Invalid input(s): FREET3  Other results:   Imaging    No results found.   Medications:     Scheduled Medications: . amiodarone  200 mg Oral BID  . aspirin  81 mg Oral Daily  . atorvastatin  40 mg Oral q1800  . Chlorhexidine Gluconate Cloth  6 each Topical Daily  . furosemide  40 mg Oral BID  .  Gerhardt's butt cream   Topical QID  . heparin injection (subcutaneous)  5,000 Units Subcutaneous Q8H  . insulin aspart  0-15 Units Subcutaneous TID WC  . insulin aspart  0-5 Units Subcutaneous QHS  . insulin detemir  15 Units Subcutaneous QHS  . losartan  25 mg Oral Daily  . pantoprazole  40 mg Oral BID  . rifampin  300 mg Oral Q12H  . saccharomyces boulardii  250 mg Oral BID  . sodium chloride flush  10-40 mL Intracatheter Q12H  . spironolactone  25 mg Oral Daily  . sucralfate  1 g Oral TID WC & HS    Infusions: . sodium chloride 10 mL/hr at 11/19/18 1400  . ferumoxytol Stopped (11/18/18 1431)  . magnesium sulfate bolus IVPB 2 g (11/20/18 0754)  . vancomycin 750 mg (11/19/18 2122)    PRN Medications: sodium chloride, acetaminophen, albuterol, cyclobenzaprine, Gerhardt's butt cream, metoprolol tartrate, ondansetron (ZOFRAN) IV, oxyCODONE-acetaminophen, sennosides, traMADol   Assessment/Plan   1. 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  LHC showed chronically occluded RCA and SVG-RCA, likely scar-mediated arrhythmia.   - Quiescent on po amio  - Discussed with EP.  Ultimately, would avoid replacing PPM/ICD due to persistent staph infection.   2. CAD: Had CABG in 1980s with SVG-RCA.   - Cath 7/16 post-VT showed occluded RCA and occluded SVG-RCA.  There were collaterals, so likely chronic.  - No s/s ischemia currently. - Continue ASA 81 and statin.   3. Acute on chronic systolic CHF/cardiogenic shock: Ischemic cardiomyopathy.  Prior TEE in 6/20 showed EF 50-55%.  Echo this admission showed EF 25% with diffuse hypokinesis (worse than would be expected with just RCA occlusion).  Suspect there was a degree of stunning from the cardiac arrest on 7/16, also he appears to have been in atrial fibrillation with RVR for a period of time when he was in [place], so may be component of tachy-mediated CMP.  Initial Fick CI 1.5 improved to 2.25 on repeat RHC so IABP was not placed.  -  Dobutamine stopped 7/17.  - CO-OX stable 74%. Volume status stable. Continue lasix 40 po bid - Continue spironolactone. - Increase losartan 25 mg twice a day.  - Digoxin stopped with amio and concern over need for pacing. No b-blocker or CCB 4. Atrial fibrillation: Persistent atrial fibrillation, not anticoagulated due to history of GI bleeding from PUD (discussed with patient, multiple episodes including recently).   He was back in NSR post-defibrillation but then reverted to AF yesterday. Now back in NSR on IV amio -SR/ST today  - [facility].  5. Tachy-brady syndrome:  Had been having alternating afib/RVR with bradycardia per [place] from hospital [place].  Was bradycardic post-defibrillation of VT 7/16.  However, HR  in 12s with atrial fibrillation since temporary wire placed.  As above, would like to avoid device implantation if at all possible per EP due to infection risk. - TVP out.  Did have brief 2 second pause 6. MRSA bacteremia with multifocal infections: He is currently on vancomycin and rifampin as outpatient. Continued here.  ID has been consulted.   7. H/o GI bleeding: Hemoglobin lower today, no overt bleeding.  - Iron sats low at 9%. Rec'd Feraheme 7/18 - HGb stable 8.7  8. Spinal osteomyelitis/discitis: Severe, eventually needs to get to another back operation.  - on vancomycin 9. Hypokalemia/hypomag K and Mag supplemented.     Length of Stay: 5  Amy Clegg, NP  11/20/2018, 8:41 AM  Advanced Heart Failure Team Pager [phone] (M-F; 7a - 4p)  Please contact [facility] for night-coverage after hours (4p -7a ) and weekends on [email or web address]  Patient seen and examined with the above-signed Advanced Practice Provider and/or Housestaff. I personally reviewed laboratory data, imaging studies and relevant notes. I independently examined the patient and formulated the important aspects of the plan. I have edited the note to reflect any of my  changes or salient points. I have personally discussed the plan with the patient and/or family.  Much improved from HF perspective.. No further bradycardia or VT. Volume status stable on po lasix. Remains on IV vanc per ID. Discussed with EP and would like to monitor for at least another 24 hours. Will need LifeVest on d/c  Glori Bickers, MD  9:03 AM

## 2018-11-20 NOTE — Progress Notes (Signed)
Patient refused prevalon boots, patient was educated of the benefits and rationale of prevalon boots. Patient stated he "understood the reason but is claustrophobic about his feet". Will continue to encourage the use of boots.

## 2018-11-20 NOTE — Progress Notes (Signed)
    [place] for Infectious Disease   Reason for visit: Follow up on MRSA infection  Interval History: no new events, has remained afebrile.  No complaints.  No chills.    Physical Exam: Constitutional:  Vitals:   11/20/18 0300 11/20/18 0806  BP: (!) 152/86 (!) 142/72  Pulse: 82 100  Resp: 16 20  Temp: 98.5 F (36.9 C)   SpO2: 98% 100%   patient appears in NAD Eyes: anicteric HENT: no thrush Respiratory: Normal respiratory effort; CTA B Cardiovascular: RRR GI: soft, nt, nd  Review of Systems: Constitutional: negative for fevers, chills and malaise Gastrointestinal: negative for nausea and diarrhea Integument/breast: negative for rash  Lab Results  Component Value Date   WBC 6.9 11/20/2018   HGB 8.7 (L) 11/20/2018   HCT 29.4 (L) 11/20/2018   MCV 91.6 11/20/2018   PLT 194 11/20/2018    Lab Results  Component Value Date   CREATININE 0.71 11/20/2018   BUN 16 11/20/2018   NA 138 11/20/2018   K 3.3 (L) 11/20/2018   CL 102 11/20/2018   CO2 27 11/20/2018    Lab Results  Component Value Date   ALT 7 10/19/2018   AST 15 10/19/2018   ALKPHOS 99 10/19/2018     Microbiology: Recent Results (from the past 240 hour(s))  MRSA PCR Screening     Status: None   Collection Time: 11/15/18  6:56 PM   Specimen: Nasopharyngeal  Result Value Ref Range Status   MRSA by PCR NEGATIVE NEGATIVE Final    Comment:        The GeneXpert MRSA Assay (FDA approved for NASAL specimens only), is one component of a comprehensive MRSA colonization surveillance program. It is not intended to diagnose MRSA infection nor to guide or monitor treatment for MRSA infections. Performed at [facility], [place] [address] [place], [place] [address]     Impression/Plan:  1. Device implant - discussed with Dr. Caryl Comes and there is a good plan to avoid placement of PPM/ICD and use external devices.  From an ID standpoint this is certainly preferable.  This will avoid future concerns for  infection of the device.    2.  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  Discitis/osteomyelitis at T12-L1, L3-4, L5-S1 at site of previous posterior decompression of L3-S1.  Also empyema, device vegetation, s/p removal, previous toe infection s/p amputation.  On IV vancomycin + rifampin at least through August 20  Will consider follow up CXR or CT.   Will need follow up with Dr. Saintclair Halsted regarding plan for hardware removal/surgical debridement.  He will remain on antibiotics until after surgical plan completed.  No issues with current antibiotics so will continue.    3. Medication monitoring -  Creat has remained stable.    He already has follow up with Dr. Baxter Flattery 8/5   I will sign off, call with questions.

## 2018-11-20 NOTE — Progress Notes (Signed)
NCM received call from [place], stating patient is currently with [facility] for [facility] [place] for iv abx for vancomycin.  NCM confirmed this information.   [facility] phone is [phone], spoke with Candy,she states will need to fax orders, h/p , dc summary to fax [phone].  NCM tried to contact patient in room , no answer will contact him later today.

## 2018-11-20 NOTE — Progress Notes (Addendum)
Pt has quarter size pressure ulcer noted on coccyx, stage 2. Covered with Gerhardt's Butt cream per pt request. Pt is incontinent of stool and occasionally urine, foam would be consistently moist hindering healing of wound. Buttock ruddy. No wound noted on scrotum.   [place] RN consulted 7/16 noted coccyx wound, see note for reference.

## 2018-11-20 NOTE — Progress Notes (Signed)
Pharmacy Antibiotic Note  Henry Mcpherson is a 63 y.o. male admitted on 11/15/2018 from OSH for bradycardia.  He was recently at [facility] for ABx s/p MRSA bacteremia.osteo with back hardware, and vegetation on PPM leads - now removed.   Pharmacy had been consulted for vancomycin dosing.  Vancomycin trough is 15 - therapeutic for goal trough of 15 to 20. WBC is within normal limits and patient remains afebrile. SCr is stable.  VT on admit was 12 - on vancomycin 750mg  q24hr ( dose he was on here previously with AUC 460 - at goal)  Plan: Continue Vancomycin 750mg  q24h x8 weeks - ends 8/20 per ID note Continue Rifampin 300mg  Q12h OPAT orders in place    Height: 6\' 2"  (188 cm) Weight: 200 lb 2.8 oz (90.8 kg) IBW/kg (Calculated) : 82.2  Temp (24hrs), Avg:98.2 F (36.8 C), Min:98 F (36.7 C), Max:98.5 F (36.9 C)  Recent Labs  Lab 11/15/18 1842 11/16/18 1048 11/17/18 0411 11/18/18 0419 11/19/18 0500 11/20/18 0500 11/20/18 1831  WBC  --  19.3* 9.0 8.1 6.5 6.9  --   CREATININE  --  0.75 0.64 0.61 0.62 0.71  --   VANCOTROUGH 12*  --   --   --   --   --  15    Estimated Creatinine Clearance: 107 mL/min (by C-G formula based on SCr of 0.71 mg/dL).    Allergies  Allergen Reactions  . Warfarin And Related Itching and Rash    Antimicrobials this admission: vanc 750mg  q24 Rifampin 300mg  q12h levoflox 500mg  q24 x3 days   Dose adjustments this admission:   Microbiology results: Hx BCX MRSA  [name], PharmD, BCPS, BCCCP Clinical Pharmacist Please refer to Henry Mcpherson for Henry Mcpherson numbers 11/20/2018 8:02 PM

## 2018-11-20 NOTE — Progress Notes (Signed)
PHARMACY CONSULT NOTE FOR:  OUTPATIENT  PARENTERAL ANTIBIOTIC THERAPY (OPAT)  Indication: Disseminated MRSA infection Regimen: Vancomycin 750 mg IV every 24 hours  + continue rifampin 300 mg po BID End date: 12/21/18  IV antibiotic discharge orders are pended. To discharging provider:  please sign these orders via discharge navigator,  Select New Orders & click on the button choice - Manage This Unsigned Work.     Thank you for allowing pharmacy to be a part of this patient's care.  Jimmy Footman, PharmD, BCPS, BCIDP Infectious Diseases Clinical Pharmacist Phone: [phone] 11/20/2018, 9:37 AM

## 2018-11-20 NOTE — Progress Notes (Signed)
Inpatient Diabetes Program Recommendations  AACE/ADA: New Consensus Statement on Inpatient Glycemic Control   Target Ranges:  Prepandial:   less than 140 mg/dL      Peak postprandial:   less than 180 mg/dL (1-2 hours)      Critically ill patients:  140 - 180 mg/dL   Results for Henry Mcpherson, Henry Mcpherson (MRN [id number]) as of 11/20/2018 10:30  Ref. Range 11/19/2018 06:54 11/19/2018 11:24 11/19/2018 16:11 11/20/2018 00:22 11/20/2018 06:14  Glucose-Capillary Latest Ref Range: 70 - 99 mg/dL 137 (H) 173 (H) 87 182 (H) 69 (L)   Review of Glycemic Control  Diabetes history: DM2 Outpatient Diabetes medications: Levemir 15-20 units QHS Current orders for Inpatient glycemic control: Levemir 15 units QHS, Novolog 0-15 units TID with meals, Novolog 0-5 units QHS  Inpatient Diabetes Program Recommendations:   Insulin - Basal: Fasting glucose 69 mg/dl today.  Please consider decreasing Levemir to 13 units QHS.  Thanks, Barnie Alderman, RN, MSN, CDE Diabetes Coordinator Inpatient Diabetes Program [phone] (Team Pager from 8am to 5pm)

## 2018-11-21 ENCOUNTER — Other Ambulatory Visit: Payer: Self-pay

## 2018-11-21 ENCOUNTER — Encounter: Payer: Self-pay | Admitting: Physician Assistant

## 2018-11-21 ENCOUNTER — Inpatient Hospital Stay (HOSPITAL_COMMUNITY): Payer: Medicare Other

## 2018-11-21 LAB — CBC WITH DIFFERENTIAL/PLATELET
Abs Immature Granulocytes: 0.04 10*3/uL (ref 0.00–0.07)
Basophils Absolute: 0.1 10*3/uL (ref 0.0–0.1)
Basophils Relative: 1 %
Eosinophils Absolute: 0.2 10*3/uL (ref 0.0–0.5)
Eosinophils Relative: 3 %
HCT: 29.4 % — ABNORMAL LOW (ref 39.0–52.0)
Hemoglobin: 8.8 g/dL — ABNORMAL LOW (ref 13.0–17.0)
Immature Granulocytes: 1 %
Lymphocytes Relative: 18 %
Lymphs Abs: 1.4 10*3/uL (ref 0.7–4.0)
MCH: 27.6 pg (ref 26.0–34.0)
MCHC: 29.9 g/dL — ABNORMAL LOW (ref 30.0–36.0)
MCV: 92.2 fL (ref 80.0–100.0)
Monocytes Absolute: 0.7 10*3/uL (ref 0.1–1.0)
Monocytes Relative: 9 %
Neutro Abs: 5.3 10*3/uL (ref 1.7–7.7)
Neutrophils Relative %: 68 %
Platelets: 218 10*3/uL (ref 150–400)
RBC: 3.19 MIL/uL — ABNORMAL LOW (ref 4.22–5.81)
RDW: 19.6 % — ABNORMAL HIGH (ref 11.5–15.5)
WBC: 7.7 10*3/uL (ref 4.0–10.5)
nRBC: 0 % (ref 0.0–0.2)

## 2018-11-21 LAB — BASIC METABOLIC PANEL
Anion gap: 9 (ref 5–15)
BUN: 20 mg/dL (ref 8–23)
CO2: 27 mmol/L (ref 22–32)
Calcium: 9.2 mg/dL (ref 8.9–10.3)
Chloride: 103 mmol/L (ref 98–111)
Creatinine, Ser: 0.77 mg/dL (ref 0.61–1.24)
GFR calc Af Amer: >60 mL/min (ref >60)
GFR calc non Af Amer: >60 mL/min (ref >60)
Glucose, Bld: 93 mg/dL (ref 70–99)
Potassium: 3.4 mmol/L — ABNORMAL LOW (ref 3.5–5.1)
Sodium: 139 mmol/L (ref 135–145)

## 2018-11-21 LAB — FUNGAL ORGANISM REFLEX

## 2018-11-21 LAB — GLUCOSE, CAPILLARY
Glucose-Capillary: 348 mg/dL — ABNORMAL HIGH (ref 70–99)
Glucose-Capillary: 53 mg/dL — ABNORMAL LOW (ref 70–99)
Glucose-Capillary: 107 mg/dL — ABNORMAL HIGH (ref 70–99)
Glucose-Capillary: 88 mg/dL (ref 70–99)
Glucose-Capillary: 139 mg/dL — ABNORMAL HIGH (ref 70–99)

## 2018-11-21 LAB — COOXEMETRY PANEL
Carboxyhemoglobin: 1.9 % — ABNORMAL HIGH (ref 0.5–1.5)
Methemoglobin: 0.4 % (ref 0.0–1.5)
O2 Saturation: 72.7 %
Total hemoglobin: 9 g/dL — ABNORMAL LOW (ref 12.0–16.0)

## 2018-11-21 LAB — FUNGUS CULTURE RESULT

## 2018-11-21 LAB — FUNGUS CULTURE WITH STAIN

## 2018-11-21 LAB — MAGNESIUM: Magnesium: 1.9 mg/dL (ref 1.7–2.4)

## 2018-11-21 MED ORDER — GENERIC EXTERNAL MEDICATION
Status: DC
Start: ? — End: 2018-11-21

## 2018-11-21 MED ORDER — LOSARTAN POTASSIUM 25 MG PO TABS: 25.0000 mg | ORAL_TABLET | Freq: Two times a day (BID) | ORAL | 3 refills | Status: DC

## 2018-11-21 MED ORDER — POTASSIUM CHLORIDE CRYS ER 20 MEQ PO TBCR: 40.0000 meq | EXTENDED_RELEASE_TABLET | Freq: Every day | ORAL | 3 refills | Status: DC

## 2018-11-21 MED ORDER — SPIRONOLACTONE 25 MG PO TABS: 25.0000 mg | ORAL_TABLET | Freq: Every day | ORAL | 3 refills | Status: DC

## 2018-11-21 MED ORDER — POTASSIUM CHLORIDE CRYS ER 20 MEQ PO TBCR
40.0000 meq | EXTENDED_RELEASE_TABLET | Freq: Every day | ORAL | Status: DC
  Administered 2018-11-21: 40 meq via ORAL
  Filled 2018-11-21: qty 2

## 2018-11-21 MED ORDER — AMIODARONE HCL 200 MG PO TABS: 200.0000 mg | ORAL_TABLET | Freq: Two times a day (BID) | ORAL | 2 refills | Status: DC

## 2018-11-21 NOTE — Progress Notes (Signed)
Patient provided with verbal discharge instructions; Copy of discharge instructions provided to patient. Patient belongings sent with patient along with Lifevest box and instructions. Lifevest brought to beside. Patient refused dressing change on L extremities.  Patient was discharged with Patient transport systems of [place] by stretcher.

## 2018-11-21 NOTE — Progress Notes (Addendum)
Patient ID: Grady Mohabir, male   DOB: 1955/08/19, 63 y.o.   MRN: [id number] Spent extensive amount of time discussing with Mr. 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  He does have some back pain is biggest complaint is right leg radicular symptoms that seem to mostly follow lower lumbar root distribution L4-L5 explained to him that this was an area we had previously operated on and may reflect chronic neuropathic injury that may not improve with the surgery that we are planning.  I explained the surgery we are planning as to correct the deformity he has at T12-L1 decompressive spinal cord at that level and hopefully improve some of his proximal lower extremity strength.  I explained to him that based on what wear hearing and seeing that he might be as medically optimized as he can get at this point.  Certainly ideally we would like to complete the course of IV antibiotics however in light of his multiple medical comorbidities and current cardiac situation being optimized, surgery sooner rather than later maybe better for him.  He understands he is still reluctant to proceed forward but he also knows he can continue going like he is going.  I did order a follow-up CT scan of his lumbar spine I do think the fusion is starting to mature primarily on the right side the hardware on the right side of his back looks to be in good position and certainly not contributing to right leg radicular pain deformity is still severe at T12-L1 stenosis severe at that level as well as L1-L2.  We would favor a decompressive laminectomy from T12-L2 and pedicle screw fixation from about T9 tie into his whole construct at L3.  In addition consideration would be given to removal of the left S1 screw which looks like it is migrated inferiorly with fixation to the pelvis.  I do not think that left S1 screw is symptomatic for him however  as he is having only right leg radicular symptoms he does have some thoracic spondylosis without cord compression as well.  He did request that we wait at least a couple weeks as he wants to make sure all as affairs when order he has have some apprehension proceeding for surgery with his overall general state of health.  And I did explain to him that I could not 100 percent guarantee for recovery in take away all his pain.  In this surgery does have moderate risk with all of his medical comorbidities.

## 2018-11-21 NOTE — TOC Transition Note (Signed)
Transition of Care [facility]) - CM/SW Discharge Note   Patient Details  Name: Henry Mcpherson No MRN: [id number] Date of Birth: 03/13/1956  Transition of Care [facility]) CM/SW Contact:  Zenon Mayo, RN Phone Number: 11/21/2018, 3:40 PM   Clinical Narrative:    Patient is active with [facility] for [facility], HHPT, we will resume services , patient would like to continue with them.  NCM confirmed services with them and that the home iv abx will start on 7/22, and HHPT will start on 7/23.  Patient will also be getting fitted with a life vest today around 6 pm.  NCM also set up Patient Transport systems of [facility] phone [phone],.  Pick up time is 6:45 pm.  NCM informed RN and patient of this time.    Final next level of care: [place] Barriers to Discharge: No Barriers Identified   Patient Goals and CMS Choice Patient states their goals for this hospitalization and ongoing recovery are:: go home CMS Medicare.gov Compare Post Acute Care list provided to:: Patient Choice offered to / list presented to : Patient  Discharge Placement                       Discharge Plan and Services                DME Arranged: (NA)         HH Arranged: RN, PT [place] Agency: [place] Date [place]: 11/20/18 Time [place]: 47 Representative spoke with at [place]: Candy  Social Determinants of Health ([place]) Interventions     Readmission Risk Interventions No flowsheet data found.

## 2018-11-21 NOTE — Discharge Summary (Addendum)
DISCHARGE SUMMARY    Patient ID: Henry Mcpherson,  MRN: [id number], DOB/AGE: [birth year] 65 y.o.  Admit date: 11/15/2018 Discharge date: 11/21/2018  Primary Care Physician: System, Pcp Not In  Primary Cardiologist: Dr. Alden Server )[place], [place]) Electrophysiologist: Dr. Rayann Heman (locally, follow primarily with Dr. Earlyne Iba)  Primary Discharge Diagnosis:  1. VT 2. 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 Persistent AFib     Not on a/c 2/2 GIB history 4. New NICM 5. Acute on chronic CHF  Secondary Discharge Diagnosis:  1. Disseminated MRSA infection 2. CAD 3. Lumbar stenosis 4. RBBB  Allergies  Allergen Reactions  . Warfarin And Related Itching and Rash     Procedures This Admission:   11/16/2018: RHC 1. Mildly elevated left and right heart filling pressures with pulmonary venous hypertension.  2. Cardiac output significant improved compared to earlier (immediately post-arrest/DCCV).    I opted not to place IABP with improvement in output.  Will follow closely, Swan left in place.    11/16/2018: R/LHC  Mid LAD lesion is 30% stenosed.  Ost RCA to Dist RCA lesion is 100% stenosed.  SVG graft was visualized by angiography.  Origin lesion is 100% stenosed.  There is severe left ventricular systolic dysfunction.  LV end diastolic pressure is normal.  The left ventricular ejection fraction is less than 25% by visual estimate.   1. Single vessel occlusive CAD. The RCA is occluded with left to right collaterals. 2. Occluded SVG to RCA 3. Severe LV dysfunction 4. Severely reduced cardiac output 5. Normal right heart and LV filling pressures. 6. Successful placement of temporary transvenous pacemaker.  Plan: Further plans per Advance Heart failure and EP teams.    Brief HPI: Henry Mcpherson is a 63 y.o. male with PMHx that includes above was initially admitted to [facility] in [place] with CP, palpitations,  observed to have AFib w/RVR and a WCT that was suspect to be AFlutter with 2:1 conduction vs VT, as well as AFib with rates 40's.  Given his PPM was previouslly extracted and concerns of rate stability he was transferred to [facility] for EP evaluation, management and consideration of device re-implant.  Hospital Course:  The patient was transferred/admitted to stepdown unit, early the following morning developed sustained VT, initially tolerated with CP though maintained BP/mentation.  He was given IV lopressor initially, though started to become more lethargic and was being prepped for DCCV when her converted to SR, immediate improvement in mentation and BP.  He was transferred to the ICU.  There he developed again VT, hemodynamically unstable requiring shock. (see code documentation).  He was brought emergently for [facility], noting stable CAD with chronic occl RCA and SVG to the RCA.  PA sats were 36.  Repeat RHC noted improvement in his numbers and balloon pump deferred.  LVEF by echo and LV gram down to 20-25%.  Is recommended to follow this up outpatient, perhaps will improve if was 2/2 to tachycardia (VT) ID was brought on board though not ideal, felt if needed would be OK to pursue device implant.  AFter much discussion/collaboration, was decided in the patient's best interest to hold off on ICD implant, with more back surgery especially in his future. He turned around rapidly and was off pressor support the following morning, maintained on amiodarone gtt eventually transitioned to PO. He has had PAFib and SR since is arrest/while here.  No bradycardia, his AFib rate controlled  in 90's-100 range.  I revisited anticoagulation with the patient.  He confirms, that despite having intervention of his PUD when he was resumed on anticoagulation he again had bleeding and has had since then even unprovoked GIB as well and has been determined that he is not an anticoagulation candidate  ID signed off, with  recommendations to continue same antibiotics, with ID follow up already in place, and to continue antibiotics through his back surgery, and at least to 12/21/2018. Dr. Saintclair Halsted was called to start thoughts toward plan for his back surgeries.   He saw the patient this morning, had a f/u CT this AM, and time-line for surgery will be about 2 weeks. CHF team signed off /cleared for discharge from their perspective.  The patient for now, would like his follow up to continue with Dr. Alden Server locally only at this juncture, given proximity for him. Follow up has been arranged for one week to include a BMP (discussed with Dr. Gae Dry office)  He will be discharged with a life vest and amiodarone 279m BID to Sept 1,2020 then decrease to daily.  The patient does not drive   I have d/w case management.   No changes to his current home health care no changes to his current outpatient antibiotic regime.  Dr. TLovena Lehas seen and examined the patient felt stable to discharge to home.   Physical Exam: Vitals:   11/20/18 2145 11/20/18 2305 11/21/18 0340 11/21/18 1117  BP: 134/68 134/64 105/80   Pulse: 84     Resp: (!) 23 20 (!) 22   Temp: 98.4 F (36.9 C) 98.5 F (36.9 C) 98.5 F (36.9 C) 97.8 F (36.6 C)  TempSrc: Oral Oral Oral Oral  SpO2: 100% 98% 99% 97%  Weight:      Height:        GEN- The patient is chronically ill appearing, in NAD, alert and oriented x 3 today.   HEENT: normocephalic, atraumatic; sclera clear, conjunctiva pink; hearing intact Lungs- CTA b/l, normal work of breathing.  No wheezes, rales, rhonchi Heart- irreg-irreg, no murmurs, rubs or gallops, PMI not laterally displaced GI- soft, non-tender, non-distended Extremities- no clubbing, cyanosis, or edema, chronic skin changes, h/o L great toe amputation MS- no significant deformity or atrophy Skin- warm and dry, no rash or lesion Psych- euthymic mood, full affect Neuro- no gross defecits  Labs:   Lab Results  Component  Value Date   WBC 7.7 11/21/2018   HGB 8.8 (L) 11/21/2018   HCT 29.4 (L) 11/21/2018   MCV 92.2 11/21/2018   PLT 218 11/21/2018    Recent Labs  Lab 11/21/18 0417  NA 139  K 3.4*  CL 103  CO2 27  BUN 20  CREATININE 0.77  CALCIUM 9.2  GLUCOSE 93    Discharge Medications:  Allergies as of 11/21/2018      Reactions   Warfarin And Related Itching, Rash      Medication List    STOP taking these medications   diphenhydramine-acetaminophen 25-500 MG Tabs tablet Commonly known as: TYLENOL PM   insulin aspart 100 UNIT/ML injection Commonly known as: novoLOG     TAKE these medications   acetaminophen 650 MG CR tablet Commonly known as: TYLENOL Take 650 mg by mouth every 8 (eight) hours as needed for pain. What changed: Another medication with the same name was removed. Continue taking this medication, and follow the directions you see here.   albuterol 108 (90 Base) MCG/ACT inhaler Commonly known as: VENTOLIN  HFA Inhale 1-2 puffs into the lungs every 6 (six) hours as needed for wheezing or shortness of breath.   amiodarone 200 MG tablet Commonly known as: PACERONE Take 1 tablet (200 mg total) by mouth 2 (two) times daily. Take 1 tablet twice daily until 01/02/2019, then reduce to once daily   atorvastatin 40 MG tablet Commonly known as: LIPITOR Take 40 mg by mouth See admin instructions. Take 40 mg by mouth every other night   [employer] VITAMIN B12 1000 MCG tablet Generic drug: cyanocobalamin Take 1,000 mcg by mouth daily.   cyclobenzaprine 10 MG tablet Commonly known as: FLEXERIL Take 1 tablet (10 mg total) by mouth 2 (two) times daily as needed for muscle spasms.   furosemide 40 MG tablet Commonly known as: LASIX Take 1 tablet (40 mg total) by mouth 2 (two) times daily for 30 days.   Levemir FlexTouch 100 UNIT/ML Pen Generic drug: Insulin Detemir Inject 5 Units into the skin every evening. What changed:   how much to take  when to take this Notes to patient:  Continue your home regime unchanged   losartan 25 MG tablet Commonly known as: COZAAR Take 1 tablet (25 mg total) by mouth 2 (two) times a day.   pantoprazole 40 MG tablet Commonly known as: PROTONIX Take 40 mg by mouth 2 (two) times daily.   potassium chloride SA 20 MEQ tablet Commonly known as: K-DUR Take 2 tablets (40 mEq total) by mouth daily. Start taking on: November 22, 2018 What changed: how much to take   PROBIOTIC DAILY PO Take 1 capsule by mouth daily.   rifampin 300 MG capsule Commonly known as: RIFADIN Take 1 capsule (300 mg total) by mouth every 12 (twelve) hours.   senna-docusate 8.6-50 MG tablet Commonly known as: Senokot-S Take 1 tablet by mouth 2 (two) times daily as needed for mild constipation.   spironolactone 25 MG tablet Commonly known as: ALDACTONE Take 1 tablet (25 mg total) by mouth daily. Start taking on: November 22, 2018   sucralfate 1 g tablet Commonly known as: CARAFATE Take 1 g by mouth 4 (four) times daily.   traMADol 50 MG tablet Commonly known as: ULTRAM Take 50 mg by mouth every 6 (six) hours as needed for moderate pain.   vancomycin  IVPB Inject 750 mg into the vein daily for 28 days. Indication: Disseminated MRSA Infection  Last Day of Therapy:  28 days  Labs - Sunday/Monday:  CBC/D, BMP, and vancomycin trough. Labs - Thursday:  BMP and vancomycin trough Labs - Every other week:  ESR and CRP   Vancomycin 750-5 MG/150ML-% Soln Commonly known as: VANCOCIN Inject 150 mLs (750 mg total) into the vein daily for 30 days.   zolpidem 10 MG tablet Commonly known as: AMBIEN Take 10 mg by mouth at bedtime as needed for sleep.       Disposition: Home Discharge Instructions    Diet - low sodium heart healthy   Complete by: As directed    Increase activity slowly   Complete by: As directed    Increase activity slowly   Complete by: As directed      Follow-up Information    Dr. Arnoldo Lenis Follow up.   Why: 11/28/2018 @ 10:00AM  Contact information: phone [phone]       Carlyle Basques, MD Follow up.   Specialty: Infectious Diseases Why: 12/06/2018 @ 11:00AM Contact information: [place] [address] [phone]  Kary Kos, MD .   Specialty: Neurosurgery Contact information: [address] [place] [address] [phone]           Duration of Discharge Encounter: Greater than 30 minutes including physician time.  Venetia Night, PA-C 11/21/2018 2:58 PM  EP Attending  Patient seen and examined. Agree with the findings as noted above. The patient is stable for DC. His multiple medical problems are stable. I discussed the case with Dr. Saintclair Halsted of neurosurgery who has ordered a CT scan and saw the patient earlier this morning. He will be discharged back to [place] today and return for back surgery in the next couple of weeks pending CT results. I would anticipate he wear his life vest for another month or more and if his clinical course remains stable, he will be scheduled for ICD implant after he is off of anti-biotics and no evidence of any additional active infection. He will continue amiodarone as above. He is not on systemic anti-coagulation as he has had multiple GI bleeds previously.  Mikle Bosworth.D.

## 2018-11-21 NOTE — Progress Notes (Signed)
Inpatient Diabetes Program Recommendations  AACE/ADA: New Consensus Statement on Inpatient Glycemic Control   Target Ranges:  Prepandial:   less than 140 mg/dL      Peak postprandial:   less than 180 mg/dL (1-2 hours)      Critically ill patients:  140 - 180 mg/dL   Results for Henry Mcpherson, Henry Mcpherson (MRN [id number]) as of 11/21/2018 10:40  Ref. Range 11/20/2018 06:14 11/20/2018 10:36 11/20/2018 16:53 11/20/2018 22:38 11/21/2018 06:45 11/21/2018 08:49  Glucose-Capillary Latest Ref Range: 70 - 99 mg/dL 69 (L) 192 (H) 155 (H) 348 (H) 53 (L) 107 (H)   Review of Glycemic Control Diabetes history: DM2 Outpatient Diabetes medications: Levemir 15-20 units QHS Current orders for Inpatient glycemic control: Levemir 15 units QHS, Novolog 0-15 units TID with meals, Novolog 0-5 units QHS  Inpatient Diabetes Program Recommendations:   Insulin - Basal: Fasting glucose 69 mg/dl yesterday and 53 mg/dl today.  Please consider decreasing Levemir to 12 units QHS.  Insulin-Meal Coverage: Please consider ordering Novolog 2 units TID with meals for meal coverage if patient eats at least 50% of meals.  Thanks, Barnie Alderman, RN, MSN, CDE Diabetes Coordinator Inpatient Diabetes Program [phone] (Team Pager from 8am to 5pm)

## 2018-11-21 NOTE — Progress Notes (Addendum)
Patient ID: Henry Mcpherson, male   DOB: 02/07/1956, 63 y.o.   MRN: [id number]     Advanced Heart Failure Rounding Note  PCP-Cardiologist: No primary care provider on file.   Subjective:   Yesterday losartan was increased.   Echo: EF 25% with diffuse hypokinesis, normal RV, moderate MR.   Complaining of back pain. Denies SOB. No orthopnea or PND.  No palpitations. Back in AF this am despite po amio. No VT or bradycardia.   Objective:   Weight Range: 90.8 kg Body mass index is 25.7 kg/m.   Vital Signs:   Temp:  [98.1 F (36.7 C)-98.5 F (36.9 C)] 98.5 F (36.9 C) (07/21 0340) Pulse Rate:  [84-100] 84 (07/20 2145) Resp:  [20-23] 22 (07/21 0340) BP: (105-142)/(64-80) 105/80 (07/21 0340) SpO2:  [98 %-100 %] 99 % (07/21 0340) Last BM Date: 11/20/18  Weight change: Filed Weights   11/17/18 0400 11/18/18 0500 11/19/18 0500  Weight: 84.3 kg 86.2 kg 90.8 kg    Intake/Output:   Intake/Output Summary (Last 24 hours) at 11/21/2018 0719 Last data filed at 11/21/2018 0648 Gross per 24 hour  Intake 977 ml  Output 1625 ml  Net -648 ml      Physical Exam   CVP 6  General:   No resp difficulty HEENT: normal Neck: supple. JVP 5-6 . Carotids 2+ bilat; no bruits. No lymphadenopathy or thryomegaly appreciated. Cor: PMI nondisplaced. Irregular  rate & rhythm. No rubs, gallops or murmurs. Lungs: clear Abdomen: soft, nontender, nondistended. No hepatosplenomegaly. No bruits or masses. Good bowel sounds. Extremities: no cyanosis, clubbing, rash, RLE dressing. LLE 1+ edema due to chronic venous stasis/previous surgery. RLE s/pp transmet amp Neuro: alert & orientedx3, cranial nerves grossly intact. moves all 4 extremities w/o difficulty. Affect pleasant   Telemetry   A fib 80s  Personally reviewed   Labs    CBC Recent Labs    11/20/18 0500 11/21/18 0417  WBC 6.9 7.7  NEUTROABS 4.9 5.3  HGB 8.7* 8.8*  HCT 29.4* 29.4*  MCV 91.6 92.2  PLT 194 218   Basic Metabolic Panel  Recent Labs    11/20/18 0500 11/21/18 0417  NA 138 139  K 3.3* 3.4*  CL 102 103  CO2 27 27  GLUCOSE 80 93  BUN 16 20  CREATININE 0.71 0.77  CALCIUM 9.2 9.2  MG 1.7 1.9   Liver Function Tests No results for input(s): AST, ALT, ALKPHOS, BILITOT, PROT, ALBUMIN in the last 72 hours. No results for input(s): LIPASE, AMYLASE in the last 72 hours. Cardiac Enzymes No results for input(s): CKTOTAL, CKMB, CKMBINDEX, TROPONINI in the last 72 hours.  BNP: BNP (last 3 results) Recent Labs    10/10/18 2004 10/17/18 0719  BNP 500.2* 231.2*    ProBNP (last 3 results) No results for input(s): PROBNP in the last 8760 hours.   D-Dimer No results for input(s): DDIMER in the last 72 hours. Hemoglobin A1C No results for input(s): HGBA1C in the last 72 hours. Fasting Lipid Panel No results for input(s): CHOL, HDL, LDLCALC, TRIG, CHOLHDL, LDLDIRECT in the last 72 hours. Thyroid Function Tests No results for input(s): TSH, T4TOTAL, T3FREE, THYROIDAB in the last 72 hours.  Invalid input(s): FREET3  Other results:   Imaging    No results found.   Medications:     Scheduled Medications: . amiodarone  200 mg Oral BID  . aspirin  81 mg Oral Daily  . atorvastatin  40 mg Oral q1800  . Chlorhexidine Gluconate Cloth  6 each Topical Daily  . furosemide  40 mg Oral BID  . Gerhardt's butt cream   Topical QID  . heparin injection (subcutaneous)  5,000 Units Subcutaneous Q8H  . insulin aspart  0-15 Units Subcutaneous TID WC  . insulin aspart  0-5 Units Subcutaneous QHS  . insulin detemir  15 Units Subcutaneous QHS  . losartan  25 mg Oral BID  . pantoprazole  40 mg Oral BID  . potassium chloride  40 mEq Oral Daily  . rifampin  300 mg Oral Q12H  . saccharomyces boulardii  250 mg Oral BID  . sodium chloride flush  10-40 mL Intracatheter Q12H  . spironolactone  25 mg Oral Daily  . sucralfate  1 g Oral TID WC & HS    Infusions: . sodium chloride 10 mL/hr at 11/19/18 1400  .  ferumoxytol Stopped (11/18/18 1431)  . vancomycin 750 mg (11/20/18 2306)    PRN Medications: sodium chloride, acetaminophen, albuterol, cyclobenzaprine, Gerhardt's butt cream, metoprolol tartrate, ondansetron (ZOFRAN) IV, oxyCODONE-acetaminophen, sennosides, traMADol   Assessment/Plan   1. 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  LHC showed chronically occluded RCA and SVG-RCA, likely scar-mediated arrhythmia.   - Quiescent on po amio  - Discussed with EP. Planning on Life Vest at discharge.  Ultimately, would avoid replacing PPM/ICD due to persistent staph infection.   2. CAD: Had CABG in 1980s with SVG-RCA.   - Cath 7/16 post-VT showed occluded RCA and occluded SVG-RCA.  There were collaterals, so likely chronic.  - No s/s ischemia currently. - Continue ASA 81 and statin.   3. Acute on chronic systolic CHF/cardiogenic shock: Ischemic cardiomyopathy.  Prior TEE in 6/20 showed EF 50-55%.  Echo this admission showed EF 25% with diffuse hypokinesis (worse than would be expected with just RCA occlusion).  Suspect there was a degree of stunning from the cardiac arrest on 7/16, also he appears to have been in atrial fibrillation with RVR for a period of time when he was in [place], so may be component of tachy-mediated CMP.  Initial Fick CI 1.5 improved to 2.25 on repeat RHC so IABP was not placed.  - Dobutamine stopped 7/17.  - CO-OX stable 73%. CVP 6. Volume status stable. Continue lasix 40 po bid - Continue spironolactone. - Continue losartan 25 mg twice a day.  - Add kcl 40 daily - Digoxin stopped with amio and concern over need for pacing. No b-blocker or CCB 4. Atrial fibrillation: Persistent atrial fibrillation, not anticoagulated due to history of GI bleeding from PUD (discussed with patient, multiple episodes including recently).   He was back in NSR post-defibrillation but then reverted to AF yesterday.  Looks like he is back in A fib today. EP managing.   - He is not felt to  be AC candidate based on his history.  5. Tachy-brady syndrome:  Had been having alternating afib/RVR with bradycardia per report from hospital in [place].  Was bradycardic post-defibrillation of VT 7/16.  However, HR in 60s with atrial fibrillation since temporary wire placed.  As above, would like to avoid device implantation if at all possible per EP due to infection risk. - TVP out.  Did have brief 2 second pause 6. MRSA bacteremia with multifocal infections: He is currently on vancomycin and rifampin as outpatient. Continued here.  Per ID plan to continue vancomycin and rifampin until 12/21/18    7. H/o GI bleeding: Hemoglobin lower today, no overt bleeding.  - Iron sats low at 9%. Rec'd Feraheme 7/18 -  HGb stable 8.8 8. Spinal osteomyelitis/discitis: Severe, eventually needs to get to another back operation.  - on vancomycin 9. Hypokalemia/hypomag K and Mag supplemented.    Length of Stay: 6  Amy Clegg, NP  11/21/2018, 7:19 AM  Advanced Heart Failure Team Pager [phone] (M-F; 7a - 4p)  Please contact [facility] for night-coverage after hours (4p -7a ) and weekends on [email or web address]  Patient seen and examined with the above-signed Advanced Practice Provider and/or Housestaff. I personally reviewed laboratory data, imaging studies and relevant notes. I independently examined the patient and formulated the important aspects of the plan. I have edited the note to reflect any of my changes or salient points. I have personally discussed the plan with the patient and/or family.  Very stable from HF perspective. Hopefully EF will recover back to normal if we can suppress VT. He is back in AF this am. EP managing.   Ok to d/c from HF perspective on above meds. Will need BMET next week. Will arrange f/u in HF Clinic in ear future. D/w Dr. Ladona Ridgelaylor personally.  Arvilla Meresaniel Lizet Kelso, MD  3:49 PM

## 2018-11-26 LAB — ACID FAST CULTURE WITH REFLEXED SENSITIVITIES (MYCOBACTERIA): Acid Fast Culture: NEGATIVE

## 2018-11-27 ENCOUNTER — Telehealth (HOSPITAL_COMMUNITY): Payer: Self-pay | Admitting: Cardiology

## 2018-11-27 NOTE — Telephone Encounter (Signed)
Patient called to request upcoming appt be switched to a virtual appt.   Per Dr Henry Mcpherson ok to change appt to virtual as long as patient is stable and patient can return to office for labs   [facility]

## 2018-11-28 ENCOUNTER — Other Ambulatory Visit (HOSPITAL_COMMUNITY): Payer: Self-pay | Admitting: *Deleted

## 2018-11-29 ENCOUNTER — Encounter: Payer: Self-pay | Admitting: Cardiology

## 2018-12-03 LAB — ACID FAST CULTURE WITH REFLEXED SENSITIVITIES (MYCOBACTERIA): Acid Fast Culture: NEGATIVE

## 2018-12-04 NOTE — Telephone Encounter (Signed)
appt type changed

## 2018-12-05 ENCOUNTER — Other Ambulatory Visit: Payer: Self-pay

## 2018-12-05 ENCOUNTER — Ambulatory Visit (HOSPITAL_COMMUNITY)
Admission: RE | Admit: 2018-12-05 | Discharge: 2018-12-05 | Disposition: A | Discharge status: Home / Self Care | Payer: BC Managed Care – PPO | Source: Ambulatory Visit | Attending: Cardiology | Admitting: Cardiology

## 2018-12-05 ENCOUNTER — Encounter (HOSPITAL_COMMUNITY): Payer: Self-pay | Admitting: *Deleted

## 2018-12-05 ENCOUNTER — Telehealth (HOSPITAL_COMMUNITY): Payer: Self-pay | Admitting: *Deleted

## 2018-12-05 DIAGNOSIS — I5022 Chronic systolic (congestive) heart failure: Secondary | ICD-10-CM | POA: Diagnosis not present

## 2018-12-05 DIAGNOSIS — I482 Chronic atrial fibrillation, unspecified: Secondary | ICD-10-CM | POA: Diagnosis not present

## 2018-12-05 MED ORDER — BISOPROLOL FUMARATE 5 MG PO TABS: 2.5000 mg | ORAL_TABLET | Freq: Every day | ORAL | 3 refills | Status: DC

## 2018-12-05 NOTE — Telephone Encounter (Signed)
Pt states HH just drew labs on him today, he has services through [facility] in [place], called them at [phone], they state they will call us back when they get the results.  If all that was needed was not done today will need to provide them with order when they call back.  Virtual visit sch  RX sent to pharmacy   AVS sent via mychart.

## 2018-12-05 NOTE — Telephone Encounter (Signed)
-----   Message from Larey Dresser, MD sent at 12/05/2018  4:08 PM EDT ----- 1. Start bisoprolol 2.5 mg qhs 2. CMET, TSH, CBC => have home health draw.  3. Virtual visit 4 wks.

## 2018-12-05 NOTE — Patient Instructions (Addendum)
Start Bisoprolol 2.5 mg (1/2 tab) daily at bedtime  We have contacted your home health company to get your lab results   Your next virtual visit with Dr Aundra Dubin is scheduled for Tuesday Sept 1st at 3:40 PM  If you have any questions or concerns before your next appointment please send [place] a message through [place] or call our office at [phone].  At the [facility], you and your health needs are our priority. As part of our continuing mission to provide you with exceptional heart care, we have created designated Provider Care Teams. These Care Teams include your primary Cardiologist (physician) and Advanced Practice Providers (APPs- Physician Assistants and Nurse Practitioners) who all work together to provide you with the care you need, when you need it.   You may see any of the following providers on your designated Care Team at your next follow up: Marland Kitchen Dr Glori Bickers . Dr Loralie Champagne . Darrick Grinder, NP   Please be sure to bring in all your medications bottles to every appointment.

## 2018-12-06 ENCOUNTER — Ambulatory Visit (INDEPENDENT_AMBULATORY_CARE_PROVIDER_SITE_OTHER): Payer: Medicare Other | Admitting: Internal Medicine

## 2018-12-06 ENCOUNTER — Other Ambulatory Visit: Payer: Self-pay

## 2018-12-06 DIAGNOSIS — M4645 Discitis, unspecified, thoracolumbar region: Secondary | ICD-10-CM | POA: Diagnosis present

## 2018-12-06 DIAGNOSIS — R7881 Bacteremia: Secondary | ICD-10-CM

## 2018-12-06 DIAGNOSIS — B9562 Methicillin resistant Staphylococcus aureus infection as the cause of diseases classified elsewhere: Secondary | ICD-10-CM

## 2018-12-06 MED ORDER — RIFAMPIN 300 MG PO CAPS: 300.0000 mg | ORAL_CAPSULE | Freq: Two times a day (BID) | ORAL | 0 refills | Status: DC

## 2018-12-06 NOTE — Telephone Encounter (Signed)
Received call back from [facility], she states they do not service this pt, try the [name] in [place], [place] them at [phone] and they do service pt, spoke w/nurse there and she reports they drew a cbc, bmet and vanc level yesterday and pt is sch for more labs to be done on Fri 8/7.  Gave VO for cmet and tsh per Dr Aundra Dubin, she states they will get these on Friday and fax results to [place].

## 2018-12-06 NOTE — Progress Notes (Signed)
RFV :televisit Patient ID: Henry Mcpherson, male   DOB: 1955-05-15, 63 y.o.   MRN: [id number]  HPI  Henry Mcpherson is a 63yo M with disseminated MRSA infection, involving bacteremia, AICD extraction, and thoraco-lumbar spine HW infection to include discitis,paraspinal abscess and hw loosening, and presumed empyema s/p drainage- discharged from hospital on 7/21. He has continues on abtx, on vanco plus rif . He is tolerating vancomycin without AKI - he is to finish up course of abtx on 8/21  He is to see cardiology before getting [place] for surgery. Outpatient Encounter Medications as of 12/06/2018  Medication Sig  . acetaminophen (TYLENOL) 650 MG CR tablet Take 650 mg by mouth every 8 (eight) hours as needed for pain.  Marland Kitchen albuterol (PROVENTIL HFA;VENTOLIN HFA) 108 (90 Base) MCG/ACT inhaler Inhale 1-2 puffs into the lungs every 6 (six) hours as needed for wheezing or shortness of breath.  Marland Kitchen amiodarone (PACERONE) 200 MG tablet Take 1 tablet (200 mg total) by mouth 2 (two) times daily. Take 1 tablet twice daily until 01/02/2019, then reduce to once daily  . atorvastatin (LIPITOR) 40 MG tablet Take 40 mg by mouth See admin instructions. Take 40 mg by mouth every other night  . bisoprolol (ZEBETA) 5 MG tablet Take 0.5 tablets (2.5 mg total) by mouth at bedtime.  . CVS VITAMIN B12 1000 MCG tablet Take 1,000 mcg by mouth daily.  . cyclobenzaprine (FLEXERIL) 10 MG tablet Take 1 tablet (10 mg total) by mouth 2 (two) times daily as needed for muscle spasms.  . furosemide (LASIX) 40 MG tablet Take 1 tablet (40 mg total) by mouth 2 (two) times daily for 30 days.  Marland Kitchen LEVEMIR FLEXTOUCH 100 UNIT/ML Pen Inject 5 Units into the skin every evening. (Patient taking differently: Inject 15-20 Units into the skin at bedtime. )  . losartan (COZAAR) 25 MG tablet Take 1 tablet (25 mg total) by mouth 2 (two) times a day.  . potassium chloride SA (K-DUR) 20 MEQ tablet Take 2 tablets (40 mEq total) by mouth daily.  . Probiotic  Product (PROBIOTIC DAILY PO) Take 1 capsule by mouth daily.  . rifampin (RIFADIN) 300 MG capsule Take by mouth.  . spironolactone (ALDACTONE) 25 MG tablet Take 1 tablet (25 mg total) by mouth daily.  . vancomycin (VANCOCIN) 750 MG SOLR injection Inject into the vein.  Marland Kitchen zolpidem (AMBIEN) 10 MG tablet Take 10 mg by mouth at bedtime as needed for sleep.  . [DISCONTINUED] Vancomycin (VANCOCIN) 750-5 MG/150ML-% SOLN Inject into the vein.  . pantoprazole (PROTONIX) 40 MG tablet Take 40 mg by mouth 2 (two) times daily.  Marland Kitchen senna-docusate (SENOKOT-S) 8.6-50 MG tablet Take 1 tablet by mouth 2 (two) times daily as needed for mild constipation. (Patient not taking: Reported on 11/15/2018)  . sucralfate (CARAFATE) 1 g tablet Take 1 g by mouth 4 (four) times daily.  . traMADol (ULTRAM) 50 MG tablet Take 50 mg by mouth every 6 (six) hours as needed for moderate pain.    No facility-administered encounter medications on file as of 12/06/2018.      Patient Active Problem List   Diagnosis Date Noted  . Acute on chronic combined systolic and diastolic CHF (congestive heart failure) ([place])   . Symptomatic bradycardia 11/16/2018  . Ventricular tachycardia ([place])   . Persistent atrial fibrillation 11/15/2018  . Left foot infection-great toe 10/11/2018  . Diabetes mellitus type 2 with complications ([place]) 02/72/5366  . Pressure injury of skin 10/11/2018  . Cardiac device in  situ 10/11/2018  . 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 MRSA infection 10/10/2018  . HTN (hypertension) 10/10/2018  . HLD (hyperlipidemia) 10/10/2018  . GERD (gastroesophageal reflux disease) 10/10/2018  . Chronic systolic CHF (congestive heart failure) (HCC) 10/10/2018  . Atrial fibrillation, chronic 10/10/2018  . CAD (coronary artery disease) 10/10/2018  . History of amputation of left great toe (HCC) 10/10/2018  . Anemia 10/10/2018  . Stool guaiac positive 10/10/2018  . Malnutrition of moderate  degree 07/08/2018  . Spinal stenosis at L4-L5 level 07/05/2018  . Cauda equina syndrome (HCC) 07/03/2018  . Spinal stenosis of lumbar region 07/03/2018  . Community acquired pneumonia 06/29/2018  . Controlled type 2 diabetes mellitus without complication, without long-term current use of insulin (HCC) 06/26/2018  . Chronic obstructive lung disease (HCC) 06/24/2017  . Diabetic ulcer of right midfoot associated with type 2 diabetes mellitus, limited to breakdown of skin (HCC) 03/08/2016  . Foot drop, right 03/08/2016     Health Maintenance Due  Topic Date Due  . Hepatitis C Screening  Jun 30, 1955  . FOOT EXAM  10/01/1963  . OPHTHALMOLOGY EXAM  10/01/1963  . TETANUS/TDAP  09/30/1972  . COLONOSCOPY  10/01/2003  . INFLUENZA VACCINE  12/02/2018     Review of Systems12 point ros is negative Physical Exam   There were no vitals taken for this visit.   Did not examine due to televisit CBC Lab Results  Component Value Date   WBC 7.7 11/21/2018   RBC 3.19 (L) 11/21/2018   HGB 8.8 (L) 11/21/2018   HCT 29.4 (L) 11/21/2018   PLT 218 11/21/2018   MCV 92.2 11/21/2018   MCH 27.6 11/21/2018   MCHC 29.9 (L) 11/21/2018   RDW 19.6 (H) 11/21/2018   LYMPHSABS 1.4 11/21/2018   MONOABS 0.7 11/21/2018   EOSABS 0.2 11/21/2018    BMET Lab Results  Component Value Date   NA 139 11/21/2018   K 3.4 (L) 11/21/2018   CL 103 11/21/2018   CO2 27 11/21/2018   GLUCOSE 93 11/21/2018   BUN 20 11/21/2018   CREATININE 0.77 11/21/2018   CALCIUM 9.2 11/21/2018   GFRNONAA >60 11/21/2018   GFRAA >60 11/21/2018   Imaging: IMPRESSION: 1. Remote/chronic changes at T12-L1 from prior discitis osteomyelitis. No new or progressive findings. 2. Stable severe degenerative disc disease at L3-4, L4-5 and L5-S1 but there are wide decompressive laminectomies and no significant spinal or foraminal stenosis. 3. Solid-appearing interbody fusion at L2-3. 4. Stable mild lucency around the left L3 pedicle screw  suggesting loosening.   Assessment and Plan  mrsa disseminated infection with hardware infection/discitis/paraspinal infection/HW loosening = though patient has been on abtx for greater than 8 weeks, optimally needs removal of HW and debridement with plan to restart abtx depending on repeat cultures and path at surgery. Recommend to follow up with dr cram to discuss further management   Will follow up 8/21

## 2018-12-06 NOTE — Progress Notes (Signed)
Heart Failure TeleHealth Note  Due to national recommendations of social distancing due to COVID 19, Audio/video telehealth visit is felt to be most appropriate for this patient at this time.  See MyChart message from today for patient consent regarding telehealth for [facility].  Date:  12/06/2018   ID:  [name], DOB [birth year], MRN [id number]  Location: Home  Provider location: [place] [facility] Type of Visit: Established patient   PCP:  System, Pcp Not In  HF Cardiology: Dr. Shirlee Latch EP: Dr. Johney Frame  Chief Complaint: Shortness of breath   History of Present Illness: Henry Mcpherson is a 63 y.o. male who presents via audio/video conferencing for a telehealth visit today.     he denies symptoms worrisome for COVID 19.   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  He had lumbar decompression in 3/20 and fusion surgery because of cauda equina syndrome.  He continued to have back pain afterwards. He was hospitalized in [place] in late 5/20 with diabetic toe infection. He ended up have diabetic left great toe amputation.  Blood cultures showed MRSA.  He was ultimately found to have vegetation on his pacemaker in 6/20 at [facility], and PPM was extracted.  He was also found to have chronic T12-L1 MRSA osteomyelitis/discitis and residual spinal stenosis at L4-5, L5-S1 levels. He was found as well to have a right-sided empyema on thoracentesis. TEE in 6/20 showed LVEF 50-55%. He is now being treated with vancomycin.   Hope had been to avoid placement of a new PPM as HR appeared to be reasonable off nodal blockers. He  was admitted back to the hospital in [place] in 7/20 with afib/RVR and also bradycardic episodes and transferred to [facility].  At [facility], the patient developed monomorphic VT and was cardioverted, he had PEA afterwards and required epinephrine to regain a perfusing rhythm.  He was not intubated. His rate was slow post-code, he was sent to the cath lab for RHC/LHC/temporary pacemaker.  Temporary transvenous pacer was placed. Cath showed occluded SVG-RCA and occluded RCA with collaterals.  The left system looked ok.  No intervention.  Initial RHC showed mildly elevated filling pressures with cardiac index 1.5 by Fick.  Repeat RHC was done with plan to place IABP if output remained low.  However, the patient had made some improvement with CI 2.25 by Fick and 1.8 by thermo.  Given improvement, it was decided to leave Swan in place but not place IABP.  Echo in 7/20 showed EF 25% with normal RV size and systolic function. He was diuresed and discharged home with PICC for vancomycin. He was in and out of atrial fibrillation in the hospital.  At baseline, he is nearly bed-bound due to back pain and deconditioning.  He needs another back operation with Dr. Wynetta Emery.  He is not short of breath but minimally active.  No orthopnea/PND.  No peripheral edema.  He has not felt palpitations.  His Lifevest has shown several episodes of NSVT.  No lightheadedness.   Labs (7/20): K 3.4, creatinine 0.77  Past Medical History: 1. OSA 2. Spinal stenosis/cauda equina syndrome: s/p lumbar decompression in 3/20.  - Developed spinal  MRSA osteomyelitis/discitis 3. Left great toe infection in setting of DM: Amputation in 6/20.  4. Type 2 diabetes.  5. HTN 6. Atrial fibrillation: Paroxysmal. He has not been anticoagulated due to prior GI bleeding.  7. GI bleeding: Thought to be due to PUD.  8. Tachy-brady syndrome: Had [employer] Jude PPM, this was extracted in 10/16/18 with MRSA bacteremia/endocarditis.  9. CAD: S/p CABG in 1980s,  patient had SVG-RCA.  - LHC 7/20 with totally occluded RCA with collaterals and totally occluded SVG-RCA, left system with mild disease.  10. COPD: Smoker.  11. Chronic systolic CHF: Ischemic cardiomyopathy.   - Echo in 6/20 with EF 55-60%.  - LHC 7/20 with LV-gram showing EF 25% with inferior akinesis.  - Echo (7/20): EF 25%, normal RV size and systolic function, PASP 39 mmHg, moderate MR.   Past Surgical History:  Procedure Laterality Date   BACK SURGERY     BUBBLE STUDY  10/13/2018   Procedure: BUBBLE STUDY;  Surgeon: Elouise Munroe, MD;  Location: [facility] ENDOSCOPY;  Service: Cardiology;;   CORONARY ARTERY BYPASS GRAFT     1980s at [facility] in [place] N/A 11/16/2018   Procedure: IABP Insertion;  Surgeon: Larey Dresser, MD;  Location: [place] CV LAB;  Service: Cardiovascular;  Laterality: N/A;   IR LUMBAR DISC ASPIRATION W/IMG GUIDE  10/12/2018   LAMINECTOMY     LEFT HEART CATH AND CORONARY ANGIOGRAPHY N/A 11/16/2018   Procedure: LEFT HEART CATH AND CORONARY ANGIOGRAPHY;  Surgeon: Martinique, Peter M, MD;  Location: [place] CV LAB;  Service: Cardiovascular;  Laterality: N/A;   PACEMAKER GENERATOR CHANGE  10/30/2015   SJM Assurity VR by Dr Alden Server in [place]  05/01/2009   SJM PPM implanted by Dr Lysle Rubens for bradycardia in [place] N/A 10/16/2018   Procedure: PACEMAKER EXTRACTION;  Surgeon: Evans Lance, MD;  Location: [place];  Service: Cardiovascular;  Laterality: N/A;   RIGHT HEART CATH N/A 11/16/2018   Procedure: RIGHT HEART CATH;  Surgeon: Martinique, Peter M, MD;  Location: [place] CV LAB;  Service: Cardiovascular;  Laterality: N/A;   RIGHT HEART CATH N/A 11/16/2018   Procedure: RIGHT HEART CATH;  Surgeon: Larey Dresser, MD;  Location: [place] CV LAB;  Service: Cardiovascular;  Laterality: N/A;   TEE WITHOUT CARDIOVERSION N/A 10/13/2018   Procedure: TRANSESOPHAGEAL  ECHOCARDIOGRAM (TEE);  Surgeon: Elouise Munroe, MD;  Location: [place];  Service: Cardiology;  Laterality: N/A;   TEE WITHOUT CARDIOVERSION N/A 10/16/2018   Procedure: TRANSESOPHAGEAL ECHOCARDIOGRAM (TEE);  Surgeon: Evans Lance, MD;  Location: [place];  Service: Cardiovascular;  Laterality: N/A;   TEMPORARY PACEMAKER N/A 11/16/2018   Procedure: TEMPORARY PACEMAKER;  Surgeon: Martinique, Peter M, MD;  Location: [place] CV LAB;  Service: Cardiovascular;  Laterality: N/A;   TOE AMPUTATION       Current Outpatient Medications  Medication Sig Dispense Refill   acetaminophen (TYLENOL) 650 MG CR tablet Take 650 mg by mouth every 8 (eight) hours as needed for pain.     albuterol (PROVENTIL HFA;VENTOLIN HFA) 108 (90 Base) MCG/ACT inhaler Inhale 1-2 puffs into the lungs every 6 (six) hours as needed for wheezing or shortness of breath.     amiodarone (PACERONE) 200 MG tablet Take 1 tablet (200 mg total) by mouth 2 (two) times daily. Take 1 tablet twice daily until 01/02/2019, then reduce to once daily 60 tablet 2  atorvastatin (LIPITOR) 40 MG tablet Take 40 mg by mouth See admin instructions. Take 40 mg by mouth every other night     bisoprolol (ZEBETA) 5 MG tablet Take 0.5 tablets (2.5 mg total) by mouth at bedtime. 15 tablet 3   CVS VITAMIN B12 1000 MCG tablet Take 1,000 mcg by mouth daily.     cyclobenzaprine (FLEXERIL) 10 MG tablet Take 1 tablet (10 mg total) by mouth 2 (two) times daily as needed for muscle spasms. 30 tablet 0   furosemide (LASIX) 40 MG tablet Take 1 tablet (40 mg total) by mouth 2 (two) times daily for 30 days. 60 tablet 0   LEVEMIR FLEXTOUCH 100 UNIT/ML Pen Inject 5 Units into the skin every evening. (Patient taking differently: Inject 15-20 Units into the skin at bedtime. ) 15 mL 11   losartan (COZAAR) 25 MG tablet Take 1 tablet (25 mg total) by mouth 2 (two) times a day. 60 tablet 3   pantoprazole (PROTONIX) 40 MG tablet Take 40 mg by mouth 2 (two) times  daily.     potassium chloride SA (K-DUR) 20 MEQ tablet Take 2 tablets (40 mEq total) by mouth daily. 60 tablet 3   Probiotic Product (PROBIOTIC DAILY PO) Take 1 capsule by mouth daily.     senna-docusate (SENOKOT-S) 8.6-50 MG tablet Take 1 tablet by mouth 2 (two) times daily as needed for mild constipation. (Patient not taking: Reported on 11/15/2018) 30 tablet 1   spironolactone (ALDACTONE) 25 MG tablet Take 1 tablet (25 mg total) by mouth daily. 30 tablet 3   sucralfate (CARAFATE) 1 g tablet Take 1 g by mouth 4 (four) times daily.     traMADol (ULTRAM) 50 MG tablet Take 50 mg by mouth every 6 (six) hours as needed for moderate pain.      zolpidem (AMBIEN) 10 MG tablet Take 10 mg by mouth at bedtime as needed for sleep.     No current facility-administered medications for this encounter.     Allergies:   Warfarin and related   Social History:  The patient  reports that he has been smoking cigarettes. He has never used smokeless tobacco. He reports previous alcohol use. He reports previous drug use.   Family History:  The patient's family history is not on file.   ROS:  Please see the history of present illness.   All other systems are personally reviewed and negative.   Exam:  (Video/Tele Health Call; Exam is subjective and or/visual.) BP 102/68 General:  Speaks in full sentences. No resp difficulty. Neck: No JVD Lungs: Normal respiratory effort with conversation.  Abdomen: Non-distended per patient report Extremities: Pt denies edema. Neuro: Alert & oriented x 3.   Recent Labs: 10/17/2018: B Natriuretic Peptide 231.2 10/19/2018: ALT 7 11/21/2018: BUN 20; Creatinine, Ser 0.77; Hemoglobin 8.8; Magnesium 1.9; Platelets 218; Potassium 3.4; Sodium 139  Personally reviewed   Wt Readings from Last 3 Encounters:  11/19/18 90.8 kg (200 lb 2.8 oz)  10/25/18 82.1 kg (181 lb)  07/10/18 96.4 kg (212 lb 8.4 oz)      ASSESSMENT AND PLAN:  1. VT: Monomorphic VT 7/16 requiring  cardioversion. LHC showed chronically occluded RCA and SVG-RCA, likely scar-mediated arrhythmia. He is now wearing a Lifevest and has had several episodes of NSVT.  - Continue amiodarone 200 mg bid to 9/1, then will decrease to daily.  He will need LFTs and TSH checked today.  He will need a regular eye exam.  - Add bisoprolol 2.5 mg  daily.  - Continue Lifevest for now.  When he has had his back operation and his back has fully healed, hopefully can have ICD re-implanted.  2. CAD: Had CABG in 1980s with SVG-RCA. Cath 11/16/18 post-VT showed occluded RCA and occluded SVG-RCA. There were collaterals, so likely chronic.  - Continue ASA 81 and statin.  3. Chronic systolic CHF: Suspect mixed ischemic/nonischemic cardiomyopathy. TEE in 6/20 showed EF 50-55%. Echo during 7/20 admission showed EF 25% with diffuse hypokinesis (worse than would be expected with just RCA occlusion). Suspect there was a degree of stunning from the cardiac arrest on 11/16/18, also he appears to have been in atrial fibrillation with RVR for a period of time when he was in [place], so may be component of tachy-mediated CMP. Initial Fick CI 1.5 improved to 2.25 on repeat RHC in 7/20. He is primarily bed-bound due to non-cardiac issues, no dyspnea.  I do not think that he is significantly volume overloaded.  - Continue spironolactone 25 mg daily and losartan 25 mg bid. BMET today.  - Add bisoprolol 2.5 mg daily as above.  - Continue Lasix 40 mg bid.  4. Atrial fibrillation: Persistent atrial fibrillation, not anticoagulated due to history of GI bleeding from PUD (discussed with patient, multiple episodes including recently). He was in and out of atrial fibrillation with recent admission.  I expect that he will be mainly in atrial fibrillation, and we cannot electively cardiovert him without anticoagulation.   - He will continue amiodarone for VT and atrial fibrillation as above.  - I suspect that he is not going to be able to  be anticoagulated based on his history.  5. Tachy-brady syndrome: He was not bradycardic at the time of discharge from the hospital in 7/20 and has tolerated amiodarone without problems.  6. MRSA bacteremia with multifocal infections:  Including spinal osteomyelitis/discitis.  He is currently on vancomycin, ID following.  7. H/o GI bleeding: Extensive history, not anticoagulated.  - CBC today.  8. Spinal osteomyelitis/discitis: Eventually needs to get to another back operation. Timing per Dr. Wynetta Emeryram and Dr. Drue SecondSnider.  I think that he is stable from a cardiac perspective.  He will be at least moderate risk for the operation, but I do not think he is prohibitive.  Adding bisoprolol today to his regimen, probably nothing more that we will be able to do to mitigate his risk from a cardiac perspective.   COVID screen The patient does not have any symptoms that suggest any further testing/ screening at this time.  Social distancing reinforced today.  Patient Risk: After full review of this patients clinical status, I feel that they are at moderate risk for cardiac decompensation at this time.  Relevant cardiac medications were reviewed at length with the patient today. The patient does not have concerns regarding their medications at this time.   Recommended follow-up:  3-4 weeks, virtual  Today, I have spent 21 minutes with the patient with telehealth technology discussing the above issues .    Signed, Marca Anconaalton Raydin Bielinski, MD  12/06/2018   [facility] [place] [address] [facility] [place] [id number] [phone] (office) [phone] (fax)

## 2018-12-07 ENCOUNTER — Telehealth: Payer: Self-pay | Admitting: Physician Assistant

## 2018-12-07 NOTE — Telephone Encounter (Addendum)
I received an email with notification of a VT event on [facility] detected though not treated. Rate 140-150 with some breaks in the rhythm, reported to last 48seconds Occurred on  11/29/2018, 9:14PM I have communicated with [facility], they report the patient was called and not symptomatic, reported feeling better then he has in a while. I inquired about the tracing stops, though still appeared to be in VT I am waiting to hear back on this.  Since then he has had 2 virtual visits one with Dr. Aundra Dubin 12/05/2018 and the patien reports  Yesterday with Dr. Shelby Dubin. He saw Dr. Baxter Flattery yesterday as well  I spoke to the patient, he confirms feeling well, no cardiac awareness at all.  He has not had any unusual weakness, no near syncope or syncope, no dizzy spells. He is getting Home health 2x week and was there today, BP was 112/69, HR was 74bpm  He tells me Dr. Baxter Flattery gave the OK for his back surgery and will get in contact with neurosurgeon tomorrow or next week to move ahead with that  He continues on the amiodarone  No changes for now.  Continue Life vest.  Tommye Standard, PAC

## 2018-12-11 ENCOUNTER — Telehealth: Payer: Self-pay | Admitting: Physician Assistant

## 2018-12-11 ENCOUNTER — Telehealth: Payer: Self-pay | Admitting: *Deleted

## 2018-12-11 NOTE — Telephone Encounter (Signed)
Opened in error

## 2018-12-11 NOTE — Telephone Encounter (Signed)
Henry Mcpherson called to let Dr Baxter Flattery know that she was only able to draw 1 purple top at today's labs.  She will ask them to run the vancomycin trough from this tube if they are able.  Otherwise, she is going back tomorrow to draw again. Landis Gandy, RN

## 2018-12-13 ENCOUNTER — Telehealth (INDEPENDENT_AMBULATORY_CARE_PROVIDER_SITE_OTHER): Payer: Medicare Other | Admitting: Internal Medicine

## 2018-12-13 ENCOUNTER — Telehealth: Payer: Self-pay

## 2018-12-13 ENCOUNTER — Other Ambulatory Visit: Payer: Self-pay

## 2018-12-13 ENCOUNTER — Encounter: Payer: Self-pay | Admitting: Internal Medicine

## 2018-12-13 VITALS — BP 95/62 | HR 51 | Ht 74.0 in | Wt 185.0 lb

## 2018-12-13 DIAGNOSIS — I472 Ventricular tachycardia: Secondary | ICD-10-CM | POA: Diagnosis not present

## 2018-12-13 DIAGNOSIS — R001 Bradycardia, unspecified: Secondary | ICD-10-CM

## 2018-12-13 DIAGNOSIS — I255 Ischemic cardiomyopathy: Secondary | ICD-10-CM

## 2018-12-13 DIAGNOSIS — I2510 Atherosclerotic heart disease of native coronary artery without angina pectoris: Secondary | ICD-10-CM

## 2018-12-13 DIAGNOSIS — I4891 Unspecified atrial fibrillation: Secondary | ICD-10-CM

## 2018-12-13 NOTE — Telephone Encounter (Signed)
Verbal orders form signed and faxed back successfully to [facility] at [phone]

## 2018-12-13 NOTE — Progress Notes (Signed)
Electrophysiology TeleHealth Note  Due to national recommendations of social distancing due to [place] 19, an audio telehealth visit is felt to be most appropriate for this patient at this time.  Verbal consent was obtained by me for the telehealth visit today.  The patient does not have capability for a virtual visit.  A phone visit is therefore required today.   Date:  12/13/2018   ID:  Henry Mcpherson, DOB [birth year], MRN [id number]  Location: patient's home  Provider location:  [facility]  Evaluation Performed: Follow-up visit   Electrophysiologist:  Dr Rayann Heman  Chief Complaint:  palpitations  History of Present Illness:    Henry Mcpherson is a 63 y.o. male who presents via telehealth conferencing today.  Since last being seen in our clinic, the patient reports doing reasonably well.  No symptoms of arrhythmia.  Awaiting back surgery.  Today, he denies symptoms of palpitations, chest pain, shortness of breath,  lower extremity edema, dizziness, presyncope, or syncope.  The patient is otherwise without complaint today.  The patient denies symptoms of fevers, chills, cough, or new SOB worrisome for COVID 19.  Past Medical History:  Diagnosis Date  . CAD (coronary artery disease)   . CHF (congestive heart failure) ([place])   . COPD (chronic obstructive pulmonary disease) ([place])   . DDD (degenerative disc disease), cervical   . Diabetes mellitus without complication ([place])   . Hypertension   . Osteomyelitis ([place])   . Permanent atrial fibrillation   . RBBB   . Sleep apnea   . Tachycardia-bradycardia syndrome [facility])     Past Surgical History:  Procedure Laterality Date  . BACK SURGERY    . BUBBLE STUDY  10/13/2018   Procedure: BUBBLE STUDY;  Surgeon: Elouise Munroe, MD;  Location: [facility] ENDOSCOPY;  Service: Cardiology;;  . CORONARY ARTERY BYPASS GRAFT     1980s at [facility] in [place]  . IABP INSERTION N/A 11/16/2018   Procedure: IABP Insertion;   Surgeon: Larey Dresser, MD;  Location: [place] CV LAB;  Service: Cardiovascular;  Laterality: N/A;  . IR LUMBAR [place] W/IMG GUIDE  10/12/2018  . LAMINECTOMY    . LEFT HEART CATH AND CORONARY ANGIOGRAPHY N/A 11/16/2018   Procedure: LEFT HEART CATH AND CORONARY ANGIOGRAPHY;  Surgeon: Martinique, Peter M, MD;  Location: [place] CV LAB;  Service: Cardiovascular;  Laterality: N/A;  . PACEMAKER GENERATOR CHANGE  10/30/2015   SJM Assurity VR by Dr Alden Server in [place]  . PACEMAKER IMPLANT  05/01/2009   SJM PPM implanted by Dr Lysle Rubens for bradycardia in [place]  . PACEMAKER LEAD REMOVAL N/A 10/16/2018   Procedure: PACEMAKER EXTRACTION;  Surgeon: Evans Lance, MD;  Location: [place];  Service: Cardiovascular;  Laterality: N/A;  . RIGHT HEART CATH N/A 11/16/2018   Procedure: RIGHT HEART CATH;  Surgeon: Martinique, Peter M, MD;  Location: [place] CV LAB;  Service: Cardiovascular;  Laterality: N/A;  . RIGHT HEART CATH N/A 11/16/2018   Procedure: RIGHT HEART CATH;  Surgeon: Larey Dresser, MD;  Location: [place] CV LAB;  Service: Cardiovascular;  Laterality: N/A;  . TEE WITHOUT CARDIOVERSION N/A 10/13/2018   Procedure: TRANSESOPHAGEAL ECHOCARDIOGRAM (TEE);  Surgeon: Elouise Munroe, MD;  Location: [place];  Service: Cardiology;  Laterality: N/A;  . TEE WITHOUT CARDIOVERSION N/A 10/16/2018   Procedure: TRANSESOPHAGEAL ECHOCARDIOGRAM (TEE);  Surgeon: Evans Lance, MD;  Location: [place];  Service: Cardiovascular;  Laterality: N/A;  .  TEMPORARY PACEMAKER N/A 11/16/2018   Procedure: TEMPORARY PACEMAKER;  Surgeon: SwazilandJordan, Peter M, MD;  [facility];  Service: Cardiovascular;  Laterality: N/A;  . TOE AMPUTATION      Current Outpatient Medications  Medication Sig Dispense Refill  . acetaminophen (TYLENOL) 650 MG CR tablet Take 650 mg by mouth every 8 (eight) hours as needed for pain.    Marland Kitchen. albuterol (PROVENTIL HFA;VENTOLIN HFA) 108 (90 Base) MCG/ACT inhaler Inhale  1-2 puffs into the lungs every 6 (six) hours as needed for wheezing or shortness of breath.    Marland Kitchen. amiodarone (PACERONE) 200 MG tablet Take 1 tablet (200 mg total) by mouth 2 (two) times daily. Take 1 tablet twice daily until 01/02/2019, then reduce to once daily 60 tablet 2  . atorvastatin (LIPITOR) 40 MG tablet Take 40 mg by mouth See admin instructions. Take 40 mg by mouth every other night    . bisoprolol (ZEBETA) 5 MG tablet Take 0.5 tablets (2.5 mg total) by mouth at bedtime. 15 tablet 3  . CVS VITAMIN B12 1000 MCG tablet Take 1,000 mcg by mouth daily.    . cyclobenzaprine (FLEXERIL) 10 MG tablet Take 1 tablet (10 mg total) by mouth 2 (two) times daily as needed for muscle spasms. 30 tablet 0  . LEVEMIR FLEXTOUCH 100 UNIT/ML Pen Inject 5 Units into the skin every evening. (Patient taking differently: Inject 15-20 Units into the skin at bedtime. ) 15 mL 11  . losartan (COZAAR) 25 MG tablet Take 1 tablet (25 mg total) by mouth 2 (two) times a day. 60 tablet 3  . pantoprazole (PROTONIX) 40 MG tablet Take 40 mg by mouth 2 (two) times daily.    . potassium chloride SA (K-DUR) 20 MEQ tablet Take 2 tablets (40 mEq total) by mouth daily. 60 tablet 3  . Probiotic Product (PROBIOTIC DAILY PO) Take 1 capsule by mouth daily.    . rifampin (RIFADIN) 300 MG capsule Take 1 capsule (300 mg total) by mouth 2 (two) times daily. 60 capsule 0  . senna-docusate (SENOKOT-S) 8.6-50 MG tablet Take 1 tablet by mouth 2 (two) times daily as needed for mild constipation. 30 tablet 1  . spironolactone (ALDACTONE) 25 MG tablet Take 1 tablet (25 mg total) by mouth daily. 30 tablet 3  . sucralfate (CARAFATE) 1 g tablet Take 1 g by mouth 4 (four) times daily.    . traMADol (ULTRAM) 50 MG tablet Take 50 mg by mouth every 6 (six) hours as needed for moderate pain.     . vancomycin (VANCOCIN) 750 MG SOLR injection Inject 750 mg into the vein.     Marland Kitchen. zolpidem (AMBIEN) 10 MG tablet Take 10 mg by mouth at bedtime as needed for sleep.     . furosemide (LASIX) 40 MG tablet Take 1 tablet (40 mg total) by mouth 2 (two) times daily for 30 days. 60 tablet 0   No current facility-administered medications for this visit.     Allergies:   Warfarin and related   Social History:  The patient  reports that he has been smoking cigarettes. He has never used smokeless tobacco. He reports previous alcohol use. He reports previous drug use.   Family History:  + HTN  ROS:  Please see the history of present illness.   All other systems are personally reviewed and negative.    Exam:    Vital Signs:  BP 95/62   Pulse (!) 51   Ht 6\' 2"  (1.88 m)  Wt 185 lb (83.9 kg)   BMI 23.75 kg/m   Well sounding   Labs/Other Tests and Data Reviewed:    Recent Labs: 10/17/2018: B Natriuretic Peptide 231.2 10/19/2018: ALT 7 11/21/2018: BUN 20; Creatinine, Ser 0.77; Hemoglobin 8.8; Magnesium 1.9; Platelets 218; Potassium 3.4; Sodium 139   Wt Readings from Last 3 Encounters:  12/13/18 185 lb (83.9 kg)  11/19/18 200 lb 2.8 oz (90.8 kg)  10/25/18 181 lb (82.1 kg)     ASSESSMENT & PLAN:    1.  VT Improved Recent VAD tracings revealed nonsustained VT Continue amiodarone 200mg  BID, convert to 200mg  daily on 01/02/2019 He does not drive  2. Ischemic CM/ CAD No ischemic symptoms No CHF symptoms Followed by Dr Shirlee Latch  3. AFib Stable Not a candidate for anticoagulation given prior GI bleeding  4. Bradycardia No symptomatic brady events  5. preop Ok to proceed with back surgery as indicated.  Would have performed at [facility] so that EP can follow closely while in the hospital. There are currently no plans to proceed with ICD or pacemaker implantation prior to this surgery.  Given prior infections, we would like to avoid any EP procedures indefinitely at this pointl   Follow-up:  Virtual visit with me in 4 weeks   Patient Risk:  after full review of this patients clinical status, I feel that they are at moderate risk at this time.   Today, I have spent 15 minutes with the patient with telehealth technology discussing arrhythmia management .    Randolm Idol, MD  12/13/2018 12:18 PM     [facility] [address] [place] [address] [phone] (office) [phone] (fax)

## 2018-12-20 ENCOUNTER — Other Ambulatory Visit: Payer: Self-pay

## 2018-12-20 ENCOUNTER — Other Ambulatory Visit: Payer: Self-pay | Admitting: Physician Assistant

## 2018-12-20 ENCOUNTER — Ambulatory Visit (INDEPENDENT_AMBULATORY_CARE_PROVIDER_SITE_OTHER): Payer: Medicare Other | Admitting: Internal Medicine

## 2018-12-20 DIAGNOSIS — R7881 Bacteremia: Secondary | ICD-10-CM | POA: Diagnosis not present

## 2018-12-20 DIAGNOSIS — B9562 Methicillin resistant Staphylococcus aureus infection as the cause of diseases classified elsewhere: Secondary | ICD-10-CM

## 2018-12-20 DIAGNOSIS — M4645 Discitis, unspecified, thoracolumbar region: Secondary | ICD-10-CM

## 2018-12-20 NOTE — Progress Notes (Signed)
Tele visit Patient ID: Henry Mcpherson, male   DOB: Sep 15, 1955, 63 y.o.   MRN: [id number]  HPI   63 y.o. 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 He has been receiving vancomycin + rifampin continuously without interruption since June 10th. His PPM was removed on 6/15 and drainage of empyema on 6/19. About to finish therapy 8/20. Will change to chronic suppression. Outpatient Encounter Medications as of 12/20/2018  Medication Sig   acetaminophen (TYLENOL) 650 MG CR tablet Take 650 mg by mouth every 8 (eight) hours as needed for pain.   albuterol (PROVENTIL HFA;VENTOLIN HFA) 108 (90 Base) MCG/ACT inhaler Inhale 1-2 puffs into the lungs every 6 (six) hours as needed for wheezing or shortness of breath.   amiodarone (PACERONE) 200 MG tablet Take 1 tablet (200 mg total) by mouth 2 (two) times daily. Take 1 tablet twice daily until 01/02/2019, then reduce to once daily   atorvastatin (LIPITOR) 40 MG tablet Take 40 mg by mouth See admin instructions. Take 40 mg by mouth every other night   bisoprolol (ZEBETA) 5 MG tablet Take 0.5 tablets (2.5 mg total) by mouth at bedtime.   CVS VITAMIN B12 1000 MCG tablet Take 1,000 mcg by mouth daily.   cyclobenzaprine (FLEXERIL) 10 MG tablet Take 1 tablet (10 mg total) by mouth 2 (two) times daily as needed for muscle spasms.   furosemide (LASIX) 40 MG tablet Take 1 tablet (40 mg total) by mouth 2 (two) times daily for 30 days.   LEVEMIR FLEXTOUCH 100 UNIT/ML Pen Inject 5 Units into the skin every evening. (Patient taking differently: Inject 15-20 Units into the skin at bedtime. )   losartan (COZAAR) 25 MG tablet Take 1 tablet (25 mg total) by mouth 2 (two) times a day.   pantoprazole (PROTONIX) 40 MG tablet Take 40 mg by mouth 2 (two) times daily.   potassium chloride SA (K-DUR) 20 MEQ  tablet Take 2 tablets (40 mEq total) by mouth daily.   Probiotic Product (PROBIOTIC DAILY PO) Take 1 capsule by mouth daily.   rifampin (RIFADIN) 300 MG capsule Take 1 capsule (300 mg total) by mouth 2 (two) times daily.   senna-docusate (SENOKOT-S) 8.6-50 MG tablet Take 1 tablet by mouth 2 (two) times daily as needed for mild constipation.   spironolactone (ALDACTONE) 25 MG tablet Take 1 tablet (25 mg total) by mouth daily.   sucralfate (CARAFATE) 1 g tablet Take 1 g by mouth 4 (four) times daily.   traMADol (ULTRAM) 50 MG tablet Take 50 mg by mouth every 6 (six) hours as needed for moderate pain.    vancomycin (VANCOCIN) 750 MG SOLR injection Inject 750 mg into the vein.    zolpidem (AMBIEN) 10 MG tablet Take 10 mg by mouth at bedtime as needed for sleep.   No facility-administered encounter medications on file as of 12/20/2018.      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  10/11/2018   MRSA infection 10/10/2018   HTN (hypertension) 10/10/2018   HLD (hyperlipidemia) 10/10/2018   GERD (gastroesophageal reflux disease) 70/62/3762   Chronic systolic CHF (congestive heart failure) ([place]) 10/10/2018   Atrial fibrillation, chronic 10/10/2018   CAD (coronary artery disease) 10/10/2018   History of amputation of left great toe ([place]) 10/10/2018   Anemia 10/10/2018   Stool guaiac positive 10/10/2018   Malnutrition of moderate degree 07/08/2018   Spinal stenosis at L4-L5 level 07/05/2018   Cauda equina syndrome  ([place]) 07/03/2018   Spinal stenosis of lumbar region 07/03/2018   Community acquired pneumonia 06/29/2018   Controlled type 2 diabetes mellitus without complication, without long-term current use of insulin ([place]) 06/26/2018   Chronic obstructive lung disease ([place]) 06/24/2017   Diabetic ulcer of right midfoot associated with type 2 diabetes mellitus, limited to breakdown of skin ([place]) 03/08/2016   Foot drop, right 03/08/2016     Health Maintenance Due  Topic Date Due   Hepatitis C Screening  07-05-1955   FOOT EXAM  10/01/1963   OPHTHALMOLOGY EXAM  10/01/1963   TETANUS/TDAP  09/30/1972   COLONOSCOPY  10/01/2003   INFLUENZA VACCINE  12/02/2018     Review of Systems occ back pain. No fevers, tolerating iv abtx. 12 point ros is otherwise negative Physical Exam   There were no vitals taken for this visit.   No exam CBC Lab Results  Component Value Date   WBC 7.7 11/21/2018   RBC 3.19 (L) 11/21/2018   HGB 8.8 (L) 11/21/2018   HCT 29.4 (L) 11/21/2018   PLT 218 11/21/2018   MCV 92.2 11/21/2018   MCH 27.6 11/21/2018   MCHC 29.9 (L) 11/21/2018   RDW 19.6 (H) 11/21/2018   LYMPHSABS 1.4 11/21/2018   MONOABS 0.7 11/21/2018   EOSABS 0.2 11/21/2018    BMET Lab Results  Component Value Date   NA 139 11/21/2018   K 3.4 (L) 11/21/2018   CL 103 11/21/2018   CO2 27 11/21/2018   GLUCOSE 93 11/21/2018   BUN 20 11/21/2018   CREATININE 0.77 11/21/2018   CALCIUM 9.2 11/21/2018   GFRNONAA >60 11/21/2018   GFRAA >60 11/21/2018    Lab Results  Component Value Date   ESRSEDRATE 38 (H) 11/17/2018   Lab Results  Component Value Date   CRP 1.5 (H) 10/12/2018     Assessment and Plan  Disseminated MRSA infection with HW discitis/osteo = Will plan to continue for additional vancomycin through surgery.  Need to reach out to dr cram he has received extend abtx therapy, still on chronic suppression to proceed with removal HW for "debulking" of infection

## 2018-12-21 ENCOUNTER — Telehealth: Payer: Self-pay

## 2018-12-21 NOTE — Telephone Encounter (Signed)
After reviewing labs Dr. Baxter Flattery would like to extend patients IV antibioics by one week and repeat sed rate and CRP after last dose. Spoke with home health nurse who was able to take verbal order to extend antibiotics and repeat labs. RN will fax lab results once the are ready for review. [place]

## 2018-12-21 NOTE — Telephone Encounter (Addendum)
Received a call today from home health nurse today asking what are next steps for patient regarding picc. RN would like to know if patient can have picc line removed or if there has been an extension with IV antibiotics. Rn also states that patient told her that he was going to have picc line in until he was seen by his back Psychologist, sport and exercise. Will page MD to advise on next steps regarding picc line Home health nurse will fax labs for MD to review.  Home health RN: [place], [place]

## 2018-12-26 ENCOUNTER — Other Ambulatory Visit: Payer: Self-pay | Admitting: Neurosurgery

## 2018-12-26 ENCOUNTER — Other Ambulatory Visit (HOSPITAL_COMMUNITY): Payer: Self-pay | Admitting: Cardiology

## 2019-01-02 ENCOUNTER — Ambulatory Visit (HOSPITAL_COMMUNITY)
Admission: RE | Admit: 2019-01-02 | Discharge: 2019-01-02 | Disposition: A | Discharge status: Home / Self Care | Payer: Medicare Other | Source: Ambulatory Visit | Attending: Cardiology | Admitting: Cardiology

## 2019-01-02 ENCOUNTER — Other Ambulatory Visit: Payer: Self-pay

## 2019-01-02 DIAGNOSIS — I5022 Chronic systolic (congestive) heart failure: Secondary | ICD-10-CM

## 2019-01-02 DIAGNOSIS — I482 Chronic atrial fibrillation, unspecified: Secondary | ICD-10-CM | POA: Diagnosis not present

## 2019-01-03 ENCOUNTER — Telehealth: Payer: Self-pay

## 2019-01-03 NOTE — Telephone Encounter (Signed)
Dr. Baxter Flattery was able to review labs and gave verbal order to continue vancomycin x2 weeks. Continue weekly lab draws.  Verbal orders gave to [place] home health nurse and gave orders to [place] out of [place] office [phone] also faxed orders to [facility] at [phone] Verbal order read back and understood.  Eugenia Mcalpine

## 2019-01-03 NOTE — Telephone Encounter (Signed)
Received call from [place] to report recent labs Sed rate 31 CRP: 0.5  LPN asked HH to fax labs for provider to review.  Henry Mcpherson

## 2019-01-03 NOTE — Progress Notes (Signed)
Heart Failure TeleHealth Note  Due to national recommendations of social distancing due to [place] 19, Audio/video telehealth visit is felt to be most appropriate for this patient at this time.  See MyChart message from today for patient consent regarding telehealth for [facility].  Date:  01/03/2019   ID:  Henry Mcpherson, DOB 1956/04/05, MRN [id number]  Location: Home  Provider location: [place] [facility] Type of Visit: Established patient   PCP:  System, Pcp Not In  HF Cardiology: Dr. Aundra Dubin EP: Dr. Rayann Heman  Chief Complaint: Shortness of breath   History of Present Illness: Henry Mcpherson is a 63 y.o. male who presents via audio/video conferencing for a telehealth visit today.     he denies symptoms worrisome for COVID 19.   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  He had lumbar decompression in 3/20 and fusion surgery because of cauda equina syndrome.  He continued to have back pain afterwards. He was hospitalized in [place] in late 5/20 with diabetic toe infection. He ended up have diabetic left great toe amputation.  Blood cultures showed MRSA.  He was ultimately found to have vegetation on his pacemaker in 6/20 at [facility], and PPM was extracted.  He was also found to have chronic T12-L1 MRSA osteomyelitis/discitis and residual spinal stenosis at L4-5, L5-S1 levels. He was found as well to have a right-sided empyema on thoracentesis. TEE in 6/20 showed LVEF 50-55%. He is now being treated with vancomycin.   Hope had been to avoid placement of a new PPM as HR appeared to be reasonable off nodal blockers. He  was admitted back to the hospital in [place] in 7/20 with afib/RVR and also bradycardic episodes and transferred to [facility].  At [facility], the patient developed monomorphic VT and was cardioverted, he had PEA afterwards and required epinephrine to regain a perfusing rhythm.  He was not intubated. His rate was slow post-code, he was sent to the cath lab for RHC/LHC/temporary pacemaker.  Temporary transvenous pacer was placed. Cath showed occluded SVG-RCA and occluded RCA with collaterals.  The left system looked ok.  No intervention.  Initial RHC showed mildly elevated filling pressures with cardiac index 1.5 by Fick.  Repeat RHC was done with plan to place IABP if output remained low.  However, the patient had made some improvement with CI 2.25 by Fick and 1.8 by thermo.  Given improvement, it was decided to leave Swan in place but not place IABP.  Echo in 7/20 showed EF 25% with normal RV size and systolic function. He was diuresed and discharged home with PICC for vancomycin. He was in and out of atrial fibrillation in the hospital.  He continues to wear his Lifevest, no sustained VT.  He is going to have back surgery on 01/31/19 with Dr. Saintclair Halsted.  He stopped vancomycin last week and is now on rifampin. BP was low recently (90s/50s), and Lasix was cut back to 40 mg daily and losartan to 25 mg daily.  No lightheadedness.  Weight has not increased on lower Lasix dose.  No edema.  He is now able to walk a short distance with a walker with assistance though  still is primarily bed-bound.  No significant dyspnea, no chest pain. No fever/chills.  Overall feels pretty good considering what he has been through.   Labs (7/20): K 3.4, creatinine 0.77, hgb 8.8  Past Medical History: 1. OSA 2. Spinal stenosis/cauda equina syndrome: s/p lumbar decompression in 3/20.  - Developed spinal MRSA osteomyelitis/discitis 3. Left great toe infection in setting of DM: Amputation in 6/20.  4. Type 2 diabetes.  5. HTN 6.  Atrial fibrillation: Paroxysmal. He has not been anticoagulated due to prior GI bleeding.  7. GI bleeding: Thought to be due to PUD.  8. Tachy-brady syndrome: Had [employer] Jude PPM, this was extracted in 10/16/18 with MRSA bacteremia/endocarditis.  9. CAD: S/p CABG in 1980s, patient had SVG-RCA.  - LHC 7/20 with totally occluded RCA with collaterals and totally occluded SVG-RCA, left system with mild disease.  10. COPD: Smoker.  11. Chronic systolic CHF: Ischemic cardiomyopathy.   - Echo in 6/20 with EF 55-60%.  - LHC 7/20 with LV-gram showing EF 25% with inferior akinesis.  - Echo (7/20): EF 25%, normal RV size and systolic function, PASP 39 mmHg, moderate MR.   Past Surgical History:  Procedure Laterality Date   BACK SURGERY     BUBBLE STUDY  10/13/2018   Procedure: BUBBLE STUDY;  Surgeon: Parke Poisson, MD;  Location: [facility] ENDOSCOPY;  Service: Cardiology;;   CORONARY ARTERY BYPASS GRAFT     1980s at [facility] in [place]   IABP INSERTION N/A 11/16/2018   Procedure: IABP Insertion;  Surgeon: Laurey Morale, MD;  Location: [facility] INVASIVE CV LAB;  Service: Cardiovascular;  Laterality: N/A;   IR LUMBAR DISC ASPIRATION W/IMG GUIDE  10/12/2018   LAMINECTOMY     LEFT HEART CATH AND CORONARY ANGIOGRAPHY N/A 11/16/2018   Procedure: LEFT HEART CATH AND CORONARY ANGIOGRAPHY;  Surgeon: Swaziland, Peter M, MD;  Location: [facility] INVASIVE CV LAB;  Service: Cardiovascular;  Laterality: N/A;   PACEMAKER GENERATOR CHANGE  10/30/2015   SJM Assurity VR by Dr Blanch Media in [place]   PACEMAKER IMPLANT  05/01/2009   SJM PPM implanted by Dr Desma Paganini for bradycardia in [place]   PACEMAKER LEAD REMOVAL N/A 10/16/2018   Procedure: PACEMAKER EXTRACTION;  Surgeon: Marinus Maw, MD;  Location: [facility] OR;  Service: Cardiovascular;  Laterality: N/A;   RIGHT HEART CATH N/A 11/16/2018   Procedure: RIGHT HEART CATH;  Surgeon: Swaziland, Peter M, MD;  Location: [facility] INVASIVE CV LAB;  Service: Cardiovascular;   Laterality: N/A;   RIGHT HEART CATH N/A 11/16/2018   Procedure: RIGHT HEART CATH;  Surgeon: Laurey Morale, MD;  Location: [facility] INVASIVE CV LAB;  Service: Cardiovascular;  Laterality: N/A;   TEE WITHOUT CARDIOVERSION N/A 10/13/2018   Procedure: TRANSESOPHAGEAL ECHOCARDIOGRAM (TEE);  Surgeon: Parke Poisson, MD;  Location: [facility] ENDOSCOPY;  Service: Cardiology;  Laterality: N/A;   TEE WITHOUT CARDIOVERSION N/A 10/16/2018   Procedure: TRANSESOPHAGEAL ECHOCARDIOGRAM (TEE);  Surgeon: Marinus Maw, MD;  Location: [facility] OR;  Service: Cardiovascular;  Laterality: N/A;   TEMPORARY PACEMAKER N/A 11/16/2018   Procedure: TEMPORARY PACEMAKER;  Surgeon: Swaziland, Peter M, MD;  Location: [facility] INVASIVE CV LAB;  Service: Cardiovascular;  Laterality: N/A;   TOE AMPUTATION       Current Outpatient Medications  Medication Sig Dispense Refill   acetaminophen (TYLENOL) 650 MG CR tablet Take 650 mg by mouth every 8 (eight) hours as needed for pain.     albuterol (PROVENTIL HFA;VENTOLIN HFA) 108 (90 Base) MCG/ACT inhaler  Inhale 1-2 puffs into the lungs every 6 (six) hours as needed for wheezing or shortness of breath.     amiodarone (PACERONE) 200 MG tablet TAKE 1 TABLET TWICE DAILY UNTIL 01/02/2019, THEN REDUCE TO ONCE DAILY 90 tablet 3   atorvastatin (LIPITOR) 40 MG tablet Take 40 mg by mouth See admin instructions. Take 40 mg by mouth every other night     bisoprolol (ZEBETA) 5 MG tablet Take 0.5 tablets (2.5 mg total) by mouth at bedtime. 15 tablet 3   CVS VITAMIN B12 1000 MCG tablet Take 1,000 mcg by mouth daily.     cyclobenzaprine (FLEXERIL) 10 MG tablet Take 1 tablet (10 mg total) by mouth 2 (two) times daily as needed for muscle spasms. 30 tablet 0   furosemide (LASIX) 40 MG tablet Take 1 tablet (40 mg total) by mouth 2 (two) times daily for 30 days. 60 tablet 0   LEVEMIR FLEXTOUCH 100 UNIT/ML Pen Inject 5 Units into the skin every evening. (Patient taking differently: Inject 15-20 Units into the skin  at bedtime. ) 15 mL 11   losartan (COZAAR) 25 MG tablet Take 1 tablet (25 mg total) by mouth 2 (two) times a day. 60 tablet 3   pantoprazole (PROTONIX) 40 MG tablet Take 40 mg by mouth 2 (two) times daily.     potassium chloride SA (K-DUR) 20 MEQ tablet Take 2 tablets (40 mEq total) by mouth daily. 60 tablet 3   Probiotic Product (PROBIOTIC DAILY PO) Take 1 capsule by mouth daily.     rifampin (RIFADIN) 300 MG capsule Take 1 capsule (300 mg total) by mouth 2 (two) times daily. 60 capsule 0   senna-docusate (SENOKOT-S) 8.6-50 MG tablet Take 1 tablet by mouth 2 (two) times daily as needed for mild constipation. 30 tablet 1   spironolactone (ALDACTONE) 25 MG tablet Take 1 tablet (25 mg total) by mouth daily. 30 tablet 3   sucralfate (CARAFATE) 1 g tablet Take 1 g by mouth 4 (four) times daily.     traMADol (ULTRAM) 50 MG tablet Take 50 mg by mouth every 6 (six) hours as needed for moderate pain.      vancomycin (VANCOCIN) 750 MG SOLR injection Inject 750 mg into the vein.      zolpidem (AMBIEN) 10 MG tablet Take 10 mg by mouth at bedtime as needed for sleep.     No current facility-administered medications for this encounter.     Allergies:   Warfarin and related   Social History:  The patient  reports that he has been smoking cigarettes. He has never used smokeless tobacco. He reports previous alcohol use. He reports previous drug use.   Family History:  The patient's family history is not on file.   ROS:  Please see the history of present illness.   All other systems are personally reviewed and negative.   Exam:  (Video/Tele Health Call; Exam is subjective and or/visual.) General: Speaks in full sentences, no respiratory difficulty.  Lungs: Normal respiratory effort with conversation.  Abdomen: Non-distended per patient report.  Extremities: Patient denies edema.  Neuro: Alert/oriented x 3.   Recent Labs: 10/17/2018: B Natriuretic Peptide 231.2 10/19/2018: ALT 7 11/21/2018:  BUN 20; Creatinine, Ser 0.77; Hemoglobin 8.8; Magnesium 1.9; Platelets 218; Potassium 3.4; Sodium 139  Personally reviewed   Wt Readings from Last 3 Encounters:  12/13/18 83.9 kg (185 lb)  11/19/18 90.8 kg (200 lb 2.8 oz)  10/25/18 82.1 kg (181 lb)      ASSESSMENT  AND PLAN:  1. 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 LHC showed chronically occluded RCA and SVG-RCA, likely scar-mediated arrhythmia. He is now wearing a Lifevest and has had several episodes of NSVT.  - He can decrease amiodarone to 200 mg daily. I will arrange to have LFTs, TSH drawn.  He will need a regular eye exam.  - Continue bisoprolol 2.5 mg daily.  - Continue Lifevest for now.  When he has had his back operation and his back has fully healed, hopefully can have ICD re-implanted.  2. CAD: Had CABG in 1980s with SVG-RCA. Cath 11/16/18 post-VT showed occluded RCA and occluded SVG-RCA. There were collaterals, so likely chronic.  - Continue ASA 81 and statin.  3. Chronic systolic CHF: Suspect mixed ischemic/nonischemic cardiomyopathy. TEE in 6/20 showed EF 50-55%. Echo during 7/20 admission showed EF 25% with diffuse hypokinesis (worse than would be expected with just RCA occlusion). Suspect there was a degree of stunning from the cardiac arrest on 11/16/18, also he appears to have been in atrial fibrillation with RVR for a period of time when he was in [place], so may be component of tachy-mediated CMP. Initial Fick CI 1.5 improved to 2.25 on repeat RHC in 7/20. He is primarily bed-bound due to non-cardiac issues, no dyspnea.  I do not think that he is significantly volume overloaded.  - Continue spironolactone 25 mg daily and losartan 25 mg daily. I will arrange for BMET.  - Continue bisoprolol 2.5 mg daily as above.  - Continue Lasix 40 mg daily.  4. Atrial fibrillation: Persistent atrial fibrillation, not anticoagulated due to history of GI bleeding from PUD (discussed with patient, multiple episodes  including recently). He was in and out of atrial fibrillation with recent admission.  I expect that he will be mainly in atrial fibrillation, and we cannot electively cardiovert him without anticoagulation.   - He will continue amiodarone for VT and atrial fibrillation as above.  - I suspect that he is not going to be able to be anticoagulated based on his history.  5. Tachy-brady syndrome: He was not bradycardic at the time of discharge from the hospital in 7/20 and has tolerated amiodarone without problems.  6. MRSA bacteremia with multifocal infections:  Including spinal osteomyelitis/discitis.  He has finished vancomycin and is now on rifampin, ID following.  7. H/o GI bleeding: Extensive history, not anticoagulated.  8. Spinal osteomyelitis/discitis: Eventually needs to get to another back operation. Surgery has been scheduled with Dr. Wynetta Emeryram on 9/30.  I think that he is stable from a cardiac perspective.  He will be at least moderate risk for the operation, but I do not think he is prohibitive.  I will be available in the hospital for any post-op needs.  COVID screen The patient does not have any symptoms that suggest any further testing/ screening at this time.  Social distancing reinforced today.  Patient Risk: After full review of this patients clinical status, I feel that they are at moderate risk for cardiac decompensation at this time.  Relevant cardiac medications were reviewed at length with the patient today. The patient does not have concerns regarding their medications at this time.   Recommended follow-up:  3-4 weeks, virtual  Today, I have spent 21 minutes with the patient with telehealth technology discussing the above issues .    Signed, Marca Anconaalton Edlyn Rosenburg, MD  01/03/2019   [facility] [address] [facility] [place] [address] [phone] (office) [phone] (fax)

## 2019-01-03 NOTE — Telephone Encounter (Signed)
Received fax  Confirmation.  Henry Mcpherson

## 2019-01-04 ENCOUNTER — Telehealth (HOSPITAL_COMMUNITY): Payer: Self-pay | Admitting: *Deleted

## 2019-01-04 ENCOUNTER — Telehealth: Payer: Self-pay

## 2019-01-04 NOTE — Telephone Encounter (Signed)
Called pt's [facility] in [place], [place] at [phone] and gave VO for labs to be drawn, they verbalize understanding will fax [place] the results

## 2019-01-04 NOTE — Telephone Encounter (Signed)
Spoke with pt regarding appt on 01/10/19. Pt stated he would check his vitals prior to his appt and did not have any questions at this time.

## 2019-01-04 NOTE — Telephone Encounter (Signed)
-----   Message from Larey Dresser, MD sent at 01/02/2019  4:11 PM EDT ----- He needs his home health to draw CMET, TSH, and CBC and send to [place].  He will have surgery at [facility] on 9/31, we will figure out followup after that.

## 2019-01-07 ENCOUNTER — Other Ambulatory Visit: Payer: Self-pay | Admitting: Internal Medicine

## 2019-01-07 DIAGNOSIS — M4645 Discitis, unspecified, thoracolumbar region: Secondary | ICD-10-CM

## 2019-01-10 ENCOUNTER — Telehealth: Payer: Self-pay

## 2019-01-10 ENCOUNTER — Encounter: Payer: Self-pay | Admitting: Internal Medicine

## 2019-01-10 ENCOUNTER — Telehealth (INDEPENDENT_AMBULATORY_CARE_PROVIDER_SITE_OTHER): Payer: Medicare Other | Admitting: Internal Medicine

## 2019-01-10 VITALS — BP 104/66 | HR 51 | Ht 74.0 in | Wt 190.0 lb

## 2019-01-10 DIAGNOSIS — I472 Ventricular tachycardia, unspecified: Secondary | ICD-10-CM

## 2019-01-10 DIAGNOSIS — I4819 Other persistent atrial fibrillation: Secondary | ICD-10-CM

## 2019-01-10 DIAGNOSIS — I5022 Chronic systolic (congestive) heart failure: Secondary | ICD-10-CM

## 2019-01-10 NOTE — Telephone Encounter (Signed)
Call placed to:   [facility] in [place], [place] at [phone] and gave VO for labs to be drawn, they verbalize understanding will fax [place] the results

## 2019-01-10 NOTE — Progress Notes (Signed)
Electrophysiology TeleHealth Note   Due to national recommendations of social distancing due to COVID 19, an audio/video telehealth visit is felt to be most appropriate for this patient at this time.  See MyChart message from today for the patient's consent to telehealth for [facility].   Date:  01/10/2019   ID:  Henry Mcpherson, DOB 04/12/1956, MRN [id number]  Location: patient's home  [facility]  Evaluation Performed: Follow-up visit  PCP:  System, Pcp Not In   Electrophysiologist:  Dr Johney FrameAllred  Chief Complaint:  VT  History of Present Illness:    Henry Mcpherson is a 63 y.o. male who presents via telehealth conferencing today.  Since last being seen in our clinic, the patient reports doing reasonably well.  He has had no cardiac symptoms.  He is pleased that he is making some improvement with physical therapy.  He has actually been able to take some steps.  Today, he denies symptoms of palpitations, chest pain, shortness of breath, lower extremity edema, dizziness, presyncope, or syncope.  The patient is otherwise without complaint today.  The patient denies symptoms of fevers, chills, cough, or new SOB worrisome for COVID 19.  Past Medical History:  Diagnosis Date  . CAD (coronary artery disease)   . CHF (congestive heart failure) (HCC)   . COPD (chronic obstructive pulmonary disease) (HCC)   . DDD (degenerative disc disease), cervical   . Diabetes mellitus without complication (HCC)   . Hypertension   . Osteomyelitis (HCC)   . Permanent atrial fibrillation   . RBBB   . Sleep apnea   . Tachycardia-bradycardia syndrome [facility](HCC)     Past Surgical History:  Procedure Laterality Date  . BACK SURGERY    . BUBBLE STUDY  10/13/2018   Procedure: BUBBLE STUDY;  Surgeon: Parke PoissonAcharya, Gayatri A, MD;  [facility];  Service: Cardiology;;  . CORONARY ARTERY BYPASS GRAFT     1980[facility] at [facility] [place]  . IABP INSERTION N/A  11/16/2018   Procedure: IABP Insertion;  Surgeon: Laurey MoraleMcLean, Dalton S, MD;  [facility];  Service: Cardiovascular;  Laterality: N/A;  . IR LUMBAR DISC ASPIRATION W/IMG GUIDE  10/12/2018  . LAMINECTOMY    . LEFT HEART CATH AND CORONARY ANGIOGRAPHY N/A 11/16/2018   Procedure: LEFT HEART CATH AND CORONARY ANGIOGRAPHY;  Surgeon: SwazilandJordan, Peter M, MD;  [facility];  Service: Cardiovascular;  Laterality: N/A;  . PACEMAKER GENERATOR CHANGE  10/30/2015   SJM Assurity VR by Dr [place]anch [place]Bassil in [place]  . PACEMAKER IMPLANT  05/01/2009   SJM PPM implanted by Dr Desma PaganiniBassill for bradycardia in [place]  . PACEMAKER LEAD REMOVAL N/A 10/16/2018   Procedure: PACEMAKER EXTRACTION;  Surgeon: Marinus Mawaylor, Gregg W, MD;  [facility];  Service: Cardiovascular;  Laterality: N/A;  . RIGHT HEART CATH N/A 11/16/2018   Procedure: RIGHT HEART CATH;  Surgeon: SwazilandJordan, Peter M, MD;  [facility];  Service: Cardiovascular;  Laterality: N/A;  . RIGHT HEART CATH N/A 11/16/2018   Procedure: RIGHT HEART CATH;  Surgeon: Laurey MoraleMcLean, Dalton S, MD;  [facility];  Service: Cardiovascular;  Laterality: N/A;  . TEE WITHOUT CARDIOVERSION N/A 10/13/2018   Procedure: TRANSESOPHAGEAL ECHOCARDIOGRAM (TEE);  Surgeon: Parke PoissonAcharya, Gayatri A, MD;  [facility];  Service: Cardiology;  Laterality: N/A;  . TEE WITHOUT CARDIOVERSION N/A 10/16/2018   Procedure: TRANSESOPHAGEAL ECHOCARDIOGRAM (TEE);  Surgeon: Marinus Mawaylor, Gregg W, MD;  Location: [place] OR;  Service: Cardiovascular;  Laterality: N/A;  . TEMPORARY PACEMAKER N/A 11/16/2018   Procedure: TEMPORARY PACEMAKER;  Surgeon: Martinique, Peter M, MD;  Location: [place] CV LAB;  Service: Cardiovascular;  Laterality: N/A;  . TOE AMPUTATION      Current Outpatient Medications  Medication Sig Dispense Refill  . acetaminophen (TYLENOL) 650 MG CR tablet Take 650 mg by mouth every 8 (eight) hours as needed for pain.    Marland Kitchen albuterol (PROVENTIL HFA;VENTOLIN  HFA) 108 (90 Base) MCG/ACT inhaler Inhale 1-2 puffs into the lungs every 6 (six) hours as needed for wheezing or shortness of breath.    Marland Kitchen amiodarone (PACERONE) 200 MG tablet Take 200 mg by mouth daily.    Marland Kitchen atorvastatin (LIPITOR) 40 MG tablet Take 40 mg by mouth every other day.    . bisoprolol (ZEBETA) 5 MG tablet Take 0.5 tablets (2.5 mg total) by mouth at bedtime. 15 tablet 3  . CVS VITAMIN B12 1000 MCG tablet Take 1,000 mcg by mouth daily.    . cyclobenzaprine (FLEXERIL) 10 MG tablet Take 1 tablet (10 mg total) by mouth 2 (two) times daily as needed for muscle spasms. 30 tablet 0  . furosemide (LASIX) 40 MG tablet Take 40 mg by mouth daily.    Marland Kitchen LEVEMIR FLEXTOUCH 100 UNIT/ML Pen Inject 5 Units into the skin every evening. (Patient taking differently: Inject 15-20 Units into the skin at bedtime. ) 15 mL 11  . losartan (COZAAR) 25 MG tablet Take 25 mg by mouth daily.    . pantoprazole (PROTONIX) 40 MG tablet Take 40 mg by mouth 2 (two) times daily.    . potassium chloride SA (K-DUR) 20 MEQ tablet Take 2 tablets (40 mEq total) by mouth daily. 60 tablet 3  . Probiotic Product (PROBIOTIC DAILY PO) Take 1 capsule by mouth daily.    . rifampin (RIFADIN) 300 MG capsule TAKE 1 CAPSULE BY MOUTH TWICE A DAY 60 capsule 0  . spironolactone (ALDACTONE) 25 MG tablet Take 1 tablet (25 mg total) by mouth daily. 30 tablet 3  . sucralfate (CARAFATE) 1 g tablet Take 1 g by mouth 4 (four) times daily.    . traMADol (ULTRAM) 50 MG tablet Take 50 mg by mouth every 6 (six) hours as needed for moderate pain.     . vancomycin (VANCOCIN) 750 MG SOLR injection Inject 750 mg into the vein.     Marland Kitchen zolpidem (AMBIEN) 10 MG tablet Take 10 mg by mouth at bedtime as needed for sleep.     No current facility-administered medications for this visit.     Allergies:   Warfarin and related   Social History:  The patient  reports that he has been smoking cigarettes. He has never used smokeless tobacco. He reports previous  alcohol use. He reports previous drug use.   Family History:  CAD  ROS:  Please see the history of present illness.   All other systems are personally reviewed and negative.    Exam:    Vital Signs:  BP 104/66   Pulse (!) 51   Ht 6\' 2"  (1.88 m)   Wt 190 lb (86.2 kg)   BMI 24.39 kg/m   Thin and chronically ill appearing, alert and conversant, regular work of breathing,  good skin color Eyes- anicteric, neuro- grossly intact, skin- no apparent rash or lesions or cyanosis, mouth- oral mucosa is pink  Labs/Other Tests and Data Reviewed:    Recent Labs: 10/17/2018: B Natriuretic Peptide 231.2  10/19/2018: ALT 7 11/21/2018: BUN 20; Creatinine, Ser 0.77; Hemoglobin 8.8; Magnesium 1.9; Platelets 218; Potassium 3.4; Sodium 139   Wt Readings from Last 3 Encounters:  01/10/19 190 lb (86.2 kg)  12/13/18 185 lb (83.9 kg)  11/19/18 200 lb 2.8 oz (90.8 kg)    ASSESSMENT & PLAN:    1.  VT Clinically improved Wearing Lifevest I will obtain a remote interrogation Would delay ICD implant indefinitely.  It would be nice to see how he responds from back surgery first. Continue amiodarone Check bmet, lfts  2. Bradycardia Asymptomatic Avoiding device implant at this time (as above)  3. Ischemic CM/ CAD No ischemic symptoms No changes  4. afib Well controlled Not a candidate for anticoagulation  5. preop Ok to proceed with back surgery without further CV testing or proceduures. I would advise that he have surgeries performed at [facility] so that EP can follow closely while in the hospital. There are currently no plans to proceed with ICD or pacemaker implantation prior to this surgery.  Given prior infections, we would like to avoid any EP procedures indefinitely at this pointl  Follow-up:  6 weeks virtually with me   Patient Risk:  after full review of this patients clinical status, I feel that they are at moderate risk at this time.  Today, I have spent 15 minutes with the  patient with telehealth technology discussing arrhythmia management .    Randolm Idol, MD  01/10/2019 10:18 AM     [facility] [address] [place] [address] [phone] (office) [phone] (fax)

## 2019-01-10 NOTE — Telephone Encounter (Signed)
-----   Message from Thompson Grayer, MD sent at 01/10/2019 10:22 AM EDT ----- Patient is having home health labs today. Can we add on a CBC, BMET, Liver profile and albumen to this?

## 2019-01-16 ENCOUNTER — Other Ambulatory Visit (HOSPITAL_COMMUNITY): Payer: Self-pay | Admitting: Cardiology

## 2019-01-19 ENCOUNTER — Telehealth: Payer: Medicare Other | Admitting: Internal Medicine

## 2019-01-19 ENCOUNTER — Telehealth: Payer: Self-pay | Admitting: *Deleted

## 2019-01-19 NOTE — Telephone Encounter (Signed)
Nurse caring for patient called to report that he did not have labs 01/07/19 but he was admitted to hospital 01/10/19 with a UTI and now has an indwelling cathter and is on oral antibiotics to treat that. He had his last does of IV medication 01/18/19 and she wants to know if they can pull his PICC.   Per verbal from nurse lab on 01/10/19 WBC 7.3 Sed Rate 33 Bun 40 Creat 0.9 CRP 0.7 Platelets 19.2  Advised will ask provider and call them back once she responds.   [place] [phone]

## 2019-01-23 ENCOUNTER — Telehealth: Payer: Self-pay

## 2019-01-23 NOTE — Telephone Encounter (Signed)
RN called office today requesting verbal order to pull patient's picc. Patient has not had any antibiotics since end date of 9/17. Will route message to MD to advise on picc order. Ninetta Lights, RN: [phone]. [place]

## 2019-01-24 NOTE — Telephone Encounter (Signed)
Call placed to [place], RN: [phone] and gave verbal orders to pull picc line per Dr. Baxter Flattery. Verbal orders read back and understood.  Eugenia Mcalpine

## 2019-01-24 NOTE — Telephone Encounter (Signed)
Yes pull picc line

## 2019-01-31 ENCOUNTER — Inpatient Hospital Stay: Admit: 2019-01-31 | Payer: No Typology Code available for payment source | Admitting: Neurosurgery

## 2019-01-31 SURGERY — LAMINECTOMY WITH POSTERIOR LATERAL ARTHRODESIS LEVEL 4
Anesthesia: General | Site: Back

## 2019-02-01 ENCOUNTER — Telehealth: Payer: Self-pay

## 2019-02-01 ENCOUNTER — Other Ambulatory Visit: Payer: Self-pay | Admitting: Internal Medicine

## 2019-02-01 DIAGNOSIS — M4645 Discitis, unspecified, thoracolumbar region: Secondary | ICD-10-CM

## 2019-02-01 NOTE — Telephone Encounter (Signed)
Request received via interface.   Pending medication request   rifAMPin 300 MG TAKE 1 CAPSULE BY MOUTH TWICE A DAY Routing to provider for approval  Henry Mcpherson

## 2019-02-13 ENCOUNTER — Other Ambulatory Visit: Payer: Self-pay | Admitting: Physician Assistant

## 2019-02-14 ENCOUNTER — Other Ambulatory Visit: Payer: Self-pay | Admitting: Physician Assistant

## 2019-02-15 MED ORDER — POTASSIUM CHLORIDE CRYS ER 20 MEQ PO TBCR: 40.0000 meq | EXTENDED_RELEASE_TABLET | Freq: Every day | ORAL | 3 refills | Status: DC

## 2019-02-20 ENCOUNTER — Telehealth: Payer: Self-pay | Admitting: Internal Medicine

## 2019-02-20 NOTE — Telephone Encounter (Signed)
Henry Mcpherson, Life Vest called about a reorder she faxed to [place] today needing to be signed by Dr. Rayann Heman, she was just check on the status of that as their current one expires tomorrow 02/21/19.

## 2019-02-20 NOTE — Telephone Encounter (Signed)
If Dr. Rayann Heman is not going to place ICD, then think he can stop Lifevest at this point.

## 2019-02-20 NOTE — Telephone Encounter (Signed)
Per Dr. Benancio Deeds plan to place ICD.  Will forward to Dr. Aundra Dubin to see if he wants to continue LifeVest.

## 2019-02-21 NOTE — Telephone Encounter (Signed)
Discontinuation order given to [place] with [employer]. representative will reach out to patient to provide details regarding returning equipment

## 2019-02-22 ENCOUNTER — Other Ambulatory Visit: Payer: Self-pay | Admitting: Internal Medicine

## 2019-02-22 ENCOUNTER — Other Ambulatory Visit: Payer: Self-pay | Admitting: Physician Assistant

## 2019-02-22 DIAGNOSIS — M4645 Discitis, unspecified, thoracolumbar region: Secondary | ICD-10-CM

## 2019-02-24 ENCOUNTER — Other Ambulatory Visit (HOSPITAL_COMMUNITY): Payer: Self-pay | Admitting: Cardiology

## 2019-02-26 ENCOUNTER — Encounter: Payer: Self-pay | Admitting: Internal Medicine

## 2019-02-26 ENCOUNTER — Telehealth (INDEPENDENT_AMBULATORY_CARE_PROVIDER_SITE_OTHER): Payer: Medicare Other | Admitting: Internal Medicine

## 2019-02-26 VITALS — BP 109/64 | HR 48 | Ht 74.0 in | Wt 200.0 lb

## 2019-02-26 DIAGNOSIS — I5022 Chronic systolic (congestive) heart failure: Secondary | ICD-10-CM

## 2019-02-26 DIAGNOSIS — I4819 Other persistent atrial fibrillation: Secondary | ICD-10-CM | POA: Diagnosis not present

## 2019-02-26 DIAGNOSIS — I472 Ventricular tachycardia: Secondary | ICD-10-CM

## 2019-02-26 NOTE — Progress Notes (Signed)
Electrophysiology TeleHealth Note   Due to national recommendations of social distancing due to COVID 19, an audio/video telehealth visit is felt to be most appropriate for this patient at this time.  See MyChart message from today for the patient's consent to telehealth for [facility].   Date:  02/26/2019   ID:  Henry Mcpherson, DOB 07-Jul-1955, MRN [id number]  Location: patient's home  Provider location:  [facility]  Evaluation Performed: Follow-up visit  PCP:  System, Pcp Not In   Electrophysiologist:  Dr Rayann Heman  Chief Complaint:  palpitations  History of Present Illness:    Henry Mcpherson is a 63 y.o. male who presents via telehealth conferencing today.  Since last being seen in our clinic, the patient reports doing reasonably well.  He states "I am doing really well actually".  He states that he is becoming more mobile.  He is walking to and from the bathroom with use of a walker.  He does have edema.  Today, he denies symptoms of palpitations, chest pain, shortness of breath, dizziness, presyncope, or syncope.  The patient is otherwise without complaint today.  The patient denies symptoms of fevers, chills, cough, or new SOB worrisome for COVID 19.  Past Medical History:  Diagnosis Date  . CAD (coronary artery disease)   . CHF (congestive heart failure) ([place])   . COPD (chronic obstructive pulmonary disease) ([place])   . DDD (degenerative disc disease), cervical   . Diabetes mellitus without complication ([place])   . Hypertension   . Osteomyelitis ([place])   . Permanent atrial fibrillation ([place])   . RBBB   . Sleep apnea   . Tachycardia-bradycardia syndrome [facility])     Past Surgical History:  Procedure Laterality Date  . BACK SURGERY    . BUBBLE STUDY  10/13/2018   Procedure: BUBBLE STUDY;  Surgeon: Elouise Munroe, MD;  Location: [facility] ENDOSCOPY;  Service: Cardiology;;  . CORONARY ARTERY BYPASS GRAFT     1980s at [facility] in [place]  . IABP  INSERTION N/A 11/16/2018   Procedure: IABP Insertion;  Surgeon: Larey Dresser, MD;  Location: [place] CV LAB;  Service: Cardiovascular;  Laterality: N/A;  . IR LUMBAR [place] W/IMG GUIDE  10/12/2018  . LAMINECTOMY    . LEFT HEART CATH AND CORONARY ANGIOGRAPHY N/A 11/16/2018   Procedure: LEFT HEART CATH AND CORONARY ANGIOGRAPHY;  Surgeon: Martinique, Peter M, MD;  Location: [place] CV LAB;  Service: Cardiovascular;  Laterality: N/A;  . PACEMAKER GENERATOR CHANGE  10/30/2015   SJM Assurity VR by Dr Alden Server in [place]  . PACEMAKER IMPLANT  05/01/2009   SJM PPM implanted by Dr Lysle Rubens for bradycardia in [place]  . PACEMAKER LEAD REMOVAL N/A 10/16/2018   Procedure: PACEMAKER EXTRACTION;  Surgeon: Evans Lance, MD;  Location: [place];  Service: Cardiovascular;  Laterality: N/A;  . RIGHT HEART CATH N/A 11/16/2018   Procedure: RIGHT HEART CATH;  Surgeon: Martinique, Peter M, MD;  Location: [place] CV LAB;  Service: Cardiovascular;  Laterality: N/A;  . RIGHT HEART CATH N/A 11/16/2018   Procedure: RIGHT HEART CATH;  Surgeon: Larey Dresser, MD;  Location: [place] CV LAB;  Service: Cardiovascular;  Laterality: N/A;  . TEE WITHOUT CARDIOVERSION N/A 10/13/2018   Procedure: TRANSESOPHAGEAL ECHOCARDIOGRAM (TEE);  Surgeon: Elouise Munroe, MD;  Location: [place];  Service: Cardiology;  Laterality: N/A;  . TEE WITHOUT CARDIOVERSION N/A 10/16/2018   Procedure: TRANSESOPHAGEAL ECHOCARDIOGRAM (TEE);  Surgeon: Marinus Maw, MD;  Location[facility];  Service: Cardiovascular;  Laterality: N/A;  . TEMPORARY PACEMAKER N/A 11/16/2018   Procedure: TEMPORARY PACEMAKER;  Surgeon: Swaziland, Peter M, MD;  Location: [facility] INVASIVE CV LAB;  Service: Cardiovascular;  Laterality: N/A;  . TOE AMPUTATION      Current Outpatient Medications  Medication Sig Dispense Refill  . acetaminophen (TYLENOL) 650 MG CR tablet Take 650 mg by mouth every 8 (eight) hours as needed for pain.    Marland Kitchen albuterol (PROVENTIL  HFA;VENTOLIN HFA) 108 (90 Base) MCG/ACT inhaler Inhale 1-2 puffs into the lungs every 6 (six) hours as needed for wheezing or shortness of breath.    Marland Kitchen amiodarone (PACERONE) 200 MG tablet Take 200 mg by mouth daily.    Marland Kitchen atorvastatin (LIPITOR) 40 MG tablet Take 40 mg by mouth every other day.    . bisoprolol (ZEBETA) 5 MG tablet TAKE 1/2 TABLET AT BEDTIME 45 tablet 1  . CVS VITAMIN B12 1000 MCG tablet Take 1,000 mcg by mouth daily.    . cyclobenzaprine (FLEXERIL) 10 MG tablet Take 1 tablet (10 mg total) by mouth 2 (two) times daily as needed for muscle spasms. 30 tablet 0  . furosemide (LASIX) 40 MG tablet Take 60 mg by mouth daily.     Marland Kitchen LEVEMIR FLEXTOUCH 100 UNIT/ML Pen Inject 5 Units into the skin every evening. (Patient taking differently: Inject 15-20 Units into the skin at bedtime. ) 15 mL 11  . losartan (COZAAR) 25 MG tablet TAKE 1 TABLET BY MOUTH TWICE A DAY 180 tablet 3  . pantoprazole (PROTONIX) 40 MG tablet Take 40 mg by mouth 2 (two) times daily.    . potassium chloride SA (KLOR-CON) 20 MEQ tablet Take 2 tablets (40 mEq total) by mouth daily. 180 tablet 3  . Probiotic Product (PROBIOTIC DAILY PO) Take 1 capsule by mouth daily.    Marland Kitchen spironolactone (ALDACTONE) 25 MG tablet TAKE 1 TABLET BY MOUTH EVERY DAY 90 tablet 3  . sucralfate (CARAFATE) 1 g tablet Take 1 g by mouth 4 (four) times daily.    . traMADol (ULTRAM) 50 MG tablet Take 50 mg by mouth every 6 (six) hours as needed for moderate pain.     Marland Kitchen zolpidem (AMBIEN) 10 MG tablet Take 10 mg by mouth at bedtime as needed for sleep.     No current facility-administered medications for this visit.     Allergies:   Warfarin and related   Social History:  The patient  reports that he has been smoking cigarettes. He has never used smokeless tobacco. He reports previous alcohol use. He reports previous drug use.   Family History:  + CAD  ROS:  Please see the history of present illness.   All other systems are personally reviewed and  negative.    Exam:    Vital Signs:  BP 109/64   Pulse (!) 48   Ht 6\' 2"  (1.88 m)   Wt 200 lb (90.7 kg)   BMI 25.68 kg/m   Well sounding, alert and conversant   Labs/Other Tests and Data Reviewed:    Recent Labs: 10/17/2018: B Natriuretic Peptide 231.2 10/19/2018: ALT 7 11/21/2018: BUN 20; Creatinine, Ser 0.77; Hemoglobin 8.8; Magnesium 1.9; Platelets 218; Potassium 3.4; Sodium 139   Wt Readings from Last 3 Encounters:  02/26/19 200 lb (90.7 kg)  01/10/19 190 lb (86.2 kg)  12/13/18 185 lb (83.9 kg)      ASSESSMENT & PLAN:    1.  Sick  sinus syndrome He reports that frequently his heart rates are 60-80 bpm Denies symptoms of bradycardia  2. VT Clinically without symptoms On amiodarone Does not drive We will obtain lifevest interrogation to see if he has been having any VT events. Echo in June showed improved EF, though EF 11/16/2018 was 25% He has been treated with medical therapy.  We have had a long discussion today about keeping lifevest vs taking it off.  We have decided together to repeat echo (locally in [place]) If his EF remains depressed, he may benefit from ICD consideration.  If his EF has improved, I would favor ongoing amiodarone and cessation of lifevest.  3. afib Not a candidate for anticoagulation Reasonably well controlled  4. Ischemic CM/ CAD No ischemic symptoms Repeat echo as above  Follow-up:  Follow-up with me virtually in 4 weeks   Patient Risk:  after full review of this patients clinical status, I feel that they are at moderate risk at this time.  Today, I have spent 15 minutes with the patient with telehealth technology discussing arrhythmia management .    SignedHillis Range, MD  02/26/2019 2:25 PM     [facility] [address] [place] [address] [phone] (office) [phone] (fax)

## 2019-03-01 ENCOUNTER — Telehealth: Payer: Self-pay | Admitting: *Deleted

## 2019-03-01 NOTE — Telephone Encounter (Signed)
Received refill request for [facility]ampin [facility] [place], [place].  Per Dr Baxter Flattery, patient should be taking this with doxycycline 100 mg twice daily until he is seen by Dr Saintclair Halsted.  RN spoke with [facility] at Dr Windy Carina office to see when the patient is following up.  Per Ramiro Harvest, the patient has been doing significantly better since September and is going to have the MRI when he felt up to it (it is difficult for the patient to come into clinic).  She states that he has had a positive break through and is doing really well with Physical Therapy.  She will reach out to [facility] and get him in for a MRI and office visit within a few weeks.  RN spoke with the patient who states he did not request the rifampin refill, that he actually stopped the rifampin when he finished the vancomycin mid September.  He confirms that he feels much better.  He knows he is supposed to have a MRI in the next month, and RN let him know that he will get a call from [facility] to coordinate this tomorrow.  Please advise if patient should start doxycycline 100 mg twice daily with rifampin 300 mg twice daily before his MRI/office visit with Dr Saintclair Halsted. Lavone Neri, Lanice Schwab, RN

## 2019-03-05 ENCOUNTER — Telehealth: Payer: Self-pay | Admitting: Internal Medicine

## 2019-03-05 NOTE — Telephone Encounter (Signed)
New message   Pt wants to know if Dr. Rayann Heman has talked to Dr. Dwaine Gale about an echo. He said it was discussed at his visit with him.

## 2019-03-11 NOTE — Telephone Encounter (Signed)
Received message from Dr Alden Server this past week.  He plans to get an echo on patient this week and will forward results to me.

## 2019-04-02 ENCOUNTER — Other Ambulatory Visit: Payer: Self-pay

## 2019-04-02 ENCOUNTER — Telehealth: Payer: Self-pay

## 2019-04-02 ENCOUNTER — Encounter: Payer: Self-pay | Admitting: Internal Medicine

## 2019-04-02 ENCOUNTER — Telehealth (INDEPENDENT_AMBULATORY_CARE_PROVIDER_SITE_OTHER): Payer: Medicare Other | Admitting: Internal Medicine

## 2019-04-02 VITALS — BP 110/62 | HR 32 | Ht 74.0 in | Wt 201.0 lb

## 2019-04-02 DIAGNOSIS — I4891 Unspecified atrial fibrillation: Secondary | ICD-10-CM | POA: Diagnosis not present

## 2019-04-02 DIAGNOSIS — I472 Ventricular tachycardia, unspecified: Secondary | ICD-10-CM

## 2019-04-02 DIAGNOSIS — I495 Sick sinus syndrome: Secondary | ICD-10-CM | POA: Diagnosis not present

## 2019-04-02 DIAGNOSIS — I255 Ischemic cardiomyopathy: Secondary | ICD-10-CM | POA: Diagnosis not present

## 2019-04-02 DIAGNOSIS — Z79899 Other long term (current) drug therapy: Secondary | ICD-10-CM

## 2019-04-02 DIAGNOSIS — I4819 Other persistent atrial fibrillation: Secondary | ICD-10-CM

## 2019-04-02 NOTE — Telephone Encounter (Signed)
Spoke with Lattie Haw with [employer] and she will call the pt to D/C his Life vest.  [phone]

## 2019-04-02 NOTE — Progress Notes (Signed)
Electrophysiology TeleHealth Note  Due to national recommendations of social distancing due to COVID 19, an audio telehealth visit is felt to be most appropriate for this patient at this time.  Verbal consent was obtained by me for the telehealth visit today.  The patient does not have capability for a virtual visit.  A phone visit is therefore required today.   Date:  04/02/2019   ID:  Henry Mcpherson, DOB 1956-04-18, MRN [id number]  Location: patient's home  Provider location:  [facility]  Evaluation Performed: Follow-up visit  PCP:  System, Pcp Not In   Electrophysiologist:  Dr Johney Frame  Chief Complaint:  palpitations  History of Present Illness:    Henry Mcpherson is a 63 y.o. male who presents via telehealth conferencing today.  Since last being seen in our clinic, the patient reports doing very well.  His strength continues to improve.  He is walking around his house with the use of a walker.  He continues to do home PT but is considering outpatient PT.  He is able to ride in a car. His sacral decubitus wound have healed.  Today, he denies symptoms of palpitations, chest pain, shortness of breath,  lower extremity edema, dizziness, presyncope, or syncope.  The patient is otherwise without complaint today.  No shocks from his Vest.  Past Medical History:  Diagnosis Date  . CAD (coronary artery disease)   . CHF (congestive heart failure) ([facility])   . COPD (chronic obstructive pulmonary disease) ([facility])   . DDD (degenerative disc disease), cervical   . Diabetes mellitus without complication ([facility])   . Hypertension   . Osteomyelitis ([facility])   . Persistent atrial fibrillation ([facility])   . RBBB   . Sleep apnea   . Tachycardia-bradycardia syndrome [facility])     Past Surgical History:  Procedure Laterality Date  . BACK SURGERY    . BUBBLE STUDY  10/13/2018   Procedure: BUBBLE STUDY;  Surgeon: Parke Poisson, MD;  Location: [facility] ENDOSCOPY;  Service: Cardiology;;  . CORONARY  ARTERY BYPASS GRAFT     1980s at [facility] in [place]  . IABP INSERTION N/A 11/16/2018   Procedure: IABP Insertion;  Surgeon: Laurey Morale, MD;  Location: [facility] INVASIVE CV LAB;  Service: Cardiovascular;  Laterality: N/A;  . IR LUMBAR [place] W/IMG GUIDE  10/12/2018  . LAMINECTOMY    . LEFT HEART CATH AND CORONARY ANGIOGRAPHY N/A 11/16/2018   Procedure: LEFT HEART CATH AND CORONARY ANGIOGRAPHY;  Surgeon: Swaziland, Peter M, MD;  Location: [facility] INVASIVE CV LAB;  Service: Cardiovascular;  Laterality: N/A;  . PACEMAKER GENERATOR CHANGE  10/30/2015   SJM Assurity VR by Dr Blanch Media in [place]  . PACEMAKER IMPLANT  05/01/2009   SJM PPM implanted by Dr Desma Paganini for bradycardia in [place]  . PACEMAKER LEAD REMOVAL N/A 10/16/2018   Procedure: PACEMAKER EXTRACTION;  Surgeon: Marinus Maw, MD;  Location: [facility] OR;  Service: Cardiovascular;  Laterality: N/A;  . RIGHT HEART CATH N/A 11/16/2018   Procedure: RIGHT HEART CATH;  Surgeon: Swaziland, Peter M, MD;  Location: [facility] INVASIVE CV LAB;  Service: Cardiovascular;  Laterality: N/A;  . RIGHT HEART CATH N/A 11/16/2018   Procedure: RIGHT HEART CATH;  Surgeon: Laurey Morale, MD;  Location: [facility] INVASIVE CV LAB;  Service: Cardiovascular;  Laterality: N/A;  . TEE WITHOUT CARDIOVERSION N/A 10/13/2018   Procedure: TRANSESOPHAGEAL ECHOCARDIOGRAM (TEE);  Surgeon: Parke Poisson, MD;  Location: [facility] ENDOSCOPY;  Service:  Cardiology;  Laterality: N/A;  . TEE WITHOUT CARDIOVERSION N/A 10/16/2018   Procedure: TRANSESOPHAGEAL ECHOCARDIOGRAM (TEE);  Surgeon: Evans Lance, MD;  Location: [place];  Service: Cardiovascular;  Laterality: N/A;  . TEMPORARY PACEMAKER N/A 11/16/2018   Procedure: TEMPORARY PACEMAKER;  Surgeon: Martinique, Peter M, MD;  Location: [place] CV LAB;  Service: Cardiovascular;  Laterality: N/A;  . TOE AMPUTATION      Current Outpatient Medications  Medication Sig Dispense Refill  . acetaminophen (TYLENOL) 650 MG CR tablet Take 650  mg by mouth every 8 (eight) hours as needed for pain.    Marland Kitchen albuterol (PROVENTIL HFA;VENTOLIN HFA) 108 (90 Base) MCG/ACT inhaler Inhale 1-2 puffs into the lungs every 6 (six) hours as needed for wheezing or shortness of breath.    Marland Kitchen amiodarone (PACERONE) 200 MG tablet Take 200 mg by mouth daily.    Marland Kitchen atorvastatin (LIPITOR) 40 MG tablet Take 40 mg by mouth every other day.    . bisoprolol (ZEBETA) 5 MG tablet TAKE 1/2 TABLET AT BEDTIME 45 tablet 1  . CVS VITAMIN B12 1000 MCG tablet Take 1,000 mcg by mouth daily.    . cyclobenzaprine (FLEXERIL) 10 MG tablet Take 1 tablet (10 mg total) by mouth 2 (two) times daily as needed for muscle spasms. 30 tablet 0  . furosemide (LASIX) 40 MG tablet Take 60 mg by mouth daily.     Marland Kitchen LEVEMIR FLEXTOUCH 100 UNIT/ML Pen Inject 5 Units into the skin every evening. (Patient taking differently: Inject 15-20 Units into the skin at bedtime. ) 15 mL 11  . losartan (COZAAR) 25 MG tablet TAKE 1 TABLET BY MOUTH TWICE A DAY 180 tablet 3  . pantoprazole (PROTONIX) 40 MG tablet Take 40 mg by mouth 2 (two) times daily.    . potassium chloride SA (KLOR-CON) 20 MEQ tablet Take 2 tablets (40 mEq total) by mouth daily. 180 tablet 3  . Probiotic Product (PROBIOTIC DAILY PO) Take 1 capsule by mouth daily.    Marland Kitchen spironolactone (ALDACTONE) 25 MG tablet TAKE 1 TABLET BY MOUTH EVERY DAY 90 tablet 3  . sucralfate (CARAFATE) 1 g tablet Take 1 g by mouth 4 (four) times daily.    . traMADol (ULTRAM) 50 MG tablet Take 50 mg by mouth every 6 (six) hours as needed for moderate pain.     Marland Kitchen zolpidem (AMBIEN) 10 MG tablet Take 10 mg by mouth at bedtime as needed for sleep.     No current facility-administered medications for this visit.     Allergies:   Warfarin and related   Social History:  The patient  reports that he has been smoking cigarettes. He has never used smokeless tobacco. He reports previous alcohol use. He reports previous drug use.   Family History:  +CAD  ROS:  Please see the  history of present illness.   All other systems are personally reviewed and negative.    Exam:    Vital Signs:  BP 110/62   Pulse (!) 32   Ht 6\' 2"  (1.88 m)   Wt 201 lb (91.2 kg)   BMI 25.81 kg/m .    Well sounding, alert and conversant   Labs/Other Tests and Data Reviewed:    Recent Labs: 10/17/2018: B Natriuretic Peptide 231.2 10/19/2018: ALT 7 11/21/2018: BUN 20; Creatinine, Ser 0.77; Hemoglobin 8.8; Magnesium 1.9; Platelets 218; Potassium 3.4; Sodium 139   Wt Readings from Last 3 Encounters:  04/02/19 201 lb (91.2 kg)  02/26/19 200 lb (90.7 kg)  01/10/19 190 lb (86.2 kg)     lifevest recording reviewed (in [place] 11/21/18) Echo- EF has recovered  ASSESSMENT & PLAN:    1.  Sick sinus syndrome Heart rates are doing well currently No symptoms of bradycardia  2. VT EF has recovered No VT by LifeVest Continue amiodarone 200mg  dialy  3. afib Controlled Not a candidate for anticoagulation  4. Ischemic CM/ CAD No ischemic symptoms EF has recovered Followed by Dr Blanch Media  Follow-up:  3 months with me in the office Discontinue lifevest at this time   Patient Risk:  after full review of this patients clinical status, I feel that they are at moderate risk at this time.  Today, I have spent 15 minutes with the patient with telehealth technology discussing arrhythmia management .    SignedHillis Range, MD  04/02/2019 2:26 PM     [facility] [address] [place] [address] [phone] (office) [phone] (fax)

## 2019-04-02 NOTE — Telephone Encounter (Signed)
-----   Message from Thompson Grayer, MD sent at 04/02/2019  2:30 PM EST ----- Discontinue lifevest  Return to see me (virtually) in 3 months

## 2019-07-09 ENCOUNTER — Encounter: Payer: Self-pay | Admitting: Internal Medicine

## 2019-07-09 ENCOUNTER — Telehealth (INDEPENDENT_AMBULATORY_CARE_PROVIDER_SITE_OTHER): Payer: Medicare Other | Admitting: Internal Medicine

## 2019-07-09 VITALS — BP 110/60 | HR 50 | Ht 74.0 in | Wt 275.0 lb

## 2019-07-09 DIAGNOSIS — I4819 Other persistent atrial fibrillation: Secondary | ICD-10-CM

## 2019-07-09 DIAGNOSIS — I472 Ventricular tachycardia, unspecified: Secondary | ICD-10-CM

## 2019-07-09 DIAGNOSIS — I2510 Atherosclerotic heart disease of native coronary artery without angina pectoris: Secondary | ICD-10-CM

## 2019-07-09 DIAGNOSIS — I495 Sick sinus syndrome: Secondary | ICD-10-CM

## 2019-07-09 DIAGNOSIS — R601 Generalized edema: Secondary | ICD-10-CM

## 2019-07-09 NOTE — Progress Notes (Signed)
Electrophysiology TeleHealth Note   Due to national recommendations of social distancing due to COVID 19, an audio/video telehealth visit is felt to be most appropriate for this patient at this time.  See MyChart message from today for the patient's consent to telehealth for [facility].  Date:  07/09/2019   ID:  Henry Mcpherson, DOB 12-Mar-1956, MRN [id number]  Location: patient's home  Provider location:  [facility]  Evaluation Performed: Follow-up visit  PCP:  System, Pcp Not In   Electrophysiologist:  Dr Rayann Heman  Chief Complaint:  bradycardia  History of Present Illness:    Henry Mcpherson is a 64 y.o. male who presents via telehealth conferencing today.  He was recently hospitalized in [place] with C diff and sepsis.  He required colectomy.  He had complicated respiratory failure post operatively and was ventilated for quite some time.  He has ongoing issues with malnutrition (Albumen 2.3) and anasarca.  He has been well managed by Dr Alden Server (his notes reviewed today).  The patient has intermittent afib with elevated V rates as well as sinus bradycardia.  He is mostly unaware.  No further VT.  His EF has recovered.  Today, he denies symptoms of palpitations, chest pain,  dizziness, presyncope, or syncope.  He has substantial dependant edema which limits his mobility.  He is not ambulating again at this time.  He is wearing 2L O2.  The patient is otherwise without complaint today.  The patient denies symptoms of fevers, chills, cough, or new SOB worrisome for COVID 19.  Past Medical History:  Diagnosis Date  . CAD (coronary artery disease)   . CHF (congestive heart failure) ([place])   . COPD (chronic obstructive pulmonary disease) ([place])   . DDD (degenerative disc disease), cervical   . Diabetes mellitus without complication ([place])   . Hypertension   . Osteomyelitis ([place])   . Persistent atrial fibrillation ([place])   . RBBB   . Sleep apnea   . Tachycardia-bradycardia  syndrome [facility])     Past Surgical History:  Procedure Laterality Date  . BACK SURGERY    . BUBBLE STUDY  10/13/2018   Procedure: BUBBLE STUDY;  Surgeon: Elouise Munroe, MD;  Location: [facility] ENDOSCOPY;  Service: Cardiology;;  . CORONARY ARTERY BYPASS GRAFT     1980s at [facility] in [place]  . IABP INSERTION N/A 11/16/2018   Procedure: IABP Insertion;  Surgeon: Larey Dresser, MD;  Location: [place] CV LAB;  Service: Cardiovascular;  Laterality: N/A;  . IR LUMBAR [place] W/IMG GUIDE  10/12/2018  . LAMINECTOMY    . LEFT HEART CATH AND CORONARY ANGIOGRAPHY N/A 11/16/2018   Procedure: LEFT HEART CATH AND CORONARY ANGIOGRAPHY;  Surgeon: Martinique, Peter M, MD;  Location: [place] CV LAB;  Service: Cardiovascular;  Laterality: N/A;  . PACEMAKER GENERATOR CHANGE  10/30/2015   SJM Assurity VR by Dr Alden Server in [place]  . PACEMAKER IMPLANT  05/01/2009   SJM PPM implanted by Dr Lysle Rubens for bradycardia in [place]  . PACEMAKER LEAD REMOVAL N/A 10/16/2018   Procedure: PACEMAKER EXTRACTION;  Surgeon: Evans Lance, MD;  Location: [place];  Service: Cardiovascular;  Laterality: N/A;  . RIGHT HEART CATH N/A 11/16/2018   Procedure: RIGHT HEART CATH;  Surgeon: Martinique, Peter M, MD;  Location: [place] CV LAB;  Service: Cardiovascular;  Laterality: N/A;  . RIGHT HEART CATH N/A 11/16/2018   Procedure: RIGHT HEART CATH;  Surgeon: Larey Dresser,  MD;  Location: [facility] INVASIVE CV LAB;  Service: Cardiovascular;  Laterality: N/A;  . TEE WITHOUT CARDIOVERSION N/A 10/13/2018   Procedure: TRANSESOPHAGEAL ECHOCARDIOGRAM (TEE);  Surgeon: Parke Poisson, MD;  Location: [facility] ENDOSCOPY;  Service: Cardiology;  Laterality: N/A;  . TEE WITHOUT CARDIOVERSION N/A 10/16/2018   Procedure: TRANSESOPHAGEAL ECHOCARDIOGRAM (TEE);  Surgeon: Marinus Maw, MD;  Location: [facility] OR;  Service: Cardiovascular;  Laterality: N/A;  . TEMPORARY PACEMAKER N/A 11/16/2018   Procedure: TEMPORARY PACEMAKER;   Surgeon: Swaziland, Peter M, MD;  Location: [facility] INVASIVE CV LAB;  Service: Cardiovascular;  Laterality: N/A;  . TOE AMPUTATION      Current Outpatient Medications  Medication Sig Dispense Refill  . acetaminophen (TYLENOL) 650 MG CR tablet Take 650 mg by mouth every 8 (eight) hours as needed for pain.    Marland Kitchen albuterol (PROVENTIL HFA;VENTOLIN HFA) 108 (90 Base) MCG/ACT inhaler Inhale 1-2 puffs into the lungs every 6 (six) hours as needed for wheezing or shortness of breath.    Marland Kitchen amiodarone (PACERONE) 200 MG tablet Take 200 mg by mouth daily.    Marland Kitchen atorvastatin (LIPITOR) 40 MG tablet Take 40 mg by mouth every other day.    . bisoprolol (ZEBETA) 5 MG tablet TAKE 1/2 TABLET AT BEDTIME 45 tablet 1  . CVS VITAMIN B12 1000 MCG tablet Take 1,000 mcg by mouth daily.    . cyclobenzaprine (FLEXERIL) 10 MG tablet Take 1 tablet (10 mg total) by mouth 2 (two) times daily as needed for muscle spasms. 30 tablet 0  . ELIQUIS 5 MG TABS tablet Take 5 mg by mouth 2 (two) times daily.    . furosemide (LASIX) 40 MG tablet Take 60 mg by mouth daily.     . insulin lispro (HUMALOG) 100 UNIT/ML injection Inject 5 Units into the skin daily.    Marland Kitchen lactobacillus acidophilus & bulgar (LACTINEX) chewable tablet Chew 4 tablets by mouth 3 (three) times daily.    Marland Kitchen LANTUS SOLOSTAR 100 UNIT/ML Solostar Pen Inject 5 Units into the skin daily.    Marland Kitchen LEVEMIR FLEXTOUCH 100 UNIT/ML Pen Inject 5 Units into the skin every evening. 15 mL 11  . loperamide (IMODIUM) 2 MG capsule Take 2 mg by mouth 4 (four) times daily.    Marland Kitchen losartan (COZAAR) 25 MG tablet TAKE 1 TABLET BY MOUTH TWICE A DAY 180 tablet 3  . oxyCODONE (OXY IR/ROXICODONE) 5 MG immediate release tablet Take 1 tablet by mouth as needed for pain.    . pantoprazole (PROTONIX) 40 MG tablet Take 40 mg by mouth 2 (two) times daily.    . potassium chloride SA (KLOR-CON) 20 MEQ tablet Take 2 tablets (40 mEq total) by mouth daily. 180 tablet 3  . Probiotic Product (PROBIOTIC DAILY PO) Take 1  capsule by mouth daily.    . sodium bicarbonate 650 MG tablet Take 2 tablets by mouth in the morning and at bedtime.    . sodium zirconium cyclosilicate (LOKELMA) 10 g PACK packet Take 10 g by mouth 2 (two) times daily.    Marland Kitchen spironolactone (ALDACTONE) 25 MG tablet TAKE 1 TABLET BY MOUTH EVERY DAY 90 tablet 3  . sucralfate (CARAFATE) 1 g tablet Take 1 g by mouth 4 (four) times daily.    . traMADol (ULTRAM) 50 MG tablet Take 50 mg by mouth every 6 (six) hours as needed for moderate pain.     . Vitamin D, Ergocalciferol, (DRISDOL) 1.25 MG (50000 UNIT) CAPS capsule Take 50,000 Units by mouth once a week.    Marland Kitchen  zolpidem (AMBIEN) 5 MG tablet Take 1 tablet by mouth as needed for sleep.     No current facility-administered medications for this visit.    Allergies:   Warfarin and related   Social History:  The patient  reports that he has been smoking cigarettes. He has never used smokeless tobacco. He reports previous alcohol use. He reports previous drug use.     ROS:  Please see the history of present illness.   All other systems are personally reviewed and negative.    Exam:    Vital Signs:  BP 110/60   Pulse (!) 50   Ht 6\' 2"  (1.88 m)   Wt 275 lb (124.7 kg)   BMI 35.31 kg/m   Alert, ill appearing, conversant with some dysnea, wearing O2.    Labs/Other Tests and Data Reviewed:    Recent Labs: 10/17/2018: B Natriuretic Peptide 231.2 10/19/2018: ALT 7 11/21/2018: BUN 20; Creatinine, Ser 0.77; Hemoglobin 8.8; Magnesium 1.9; Platelets 218; Potassium 3.4; Sodium 139   Wt Readings from Last 3 Encounters:  07/09/19 275 lb (124.7 kg)  04/02/19 201 lb (91.2 kg)  02/26/19 200 lb (90.7 kg)      ASSESSMENT & PLAN:    1.  Sick sinus syndrome Paucity of symptoms currently Remains very high risk for pacing.  I would advise that we continue our conservative approach until he is recovered from his other more pressing issues  2. VT Resolved EF has recovered No indication for ICD at this  time  3. afib Controlled with amiodarone Importance of close follow-up to avoid toxicity is considered today.  Recent labs are reviewed Not a candidate for anticoagulation due to prior bleeding  4.CAD No ischemic symptoms  5. Edema/ anasarca multifactorial in the setting of low albumen, inactivity, and diastolic dysfunction Diuretics managed by Dr 02/28/19  Follow-up:  We will follow up virtually in 3 months unless there are changes in the interim   Patient Risk:  after full review of this patients clinical status, I feel that they are at high risk at this time.  Today, I have spent 20 minutes with the patient with telehealth technology discussing arrhythmia management .    Desma Paganini, MD  07/09/2019 11:07 AM     [facility] [address] [place] [phone] (office) [phone] (fax)

## 2019-10-09 ENCOUNTER — Telehealth: Payer: Self-pay

## 2019-10-09 NOTE — Telephone Encounter (Signed)
Left a message regarding virtual appt on 10/10/19. 

## 2019-10-10 ENCOUNTER — Encounter: Payer: Self-pay | Admitting: Internal Medicine

## 2019-10-10 ENCOUNTER — Telehealth (INDEPENDENT_AMBULATORY_CARE_PROVIDER_SITE_OTHER): Payer: Medicare Other | Admitting: Internal Medicine

## 2019-10-10 ENCOUNTER — Other Ambulatory Visit: Payer: Self-pay

## 2019-10-10 VITALS — BP 116/70 | HR 71 | Ht 74.0 in | Wt 235.0 lb

## 2019-10-10 DIAGNOSIS — I472 Ventricular tachycardia: Secondary | ICD-10-CM

## 2019-10-10 DIAGNOSIS — I4819 Other persistent atrial fibrillation: Secondary | ICD-10-CM | POA: Diagnosis not present

## 2019-10-10 DIAGNOSIS — I495 Sick sinus syndrome: Secondary | ICD-10-CM

## 2019-10-10 NOTE — Progress Notes (Signed)
Electrophysiology TeleHealth Note   Due to national recommendations of social distancing due to COVID 19, an audio/video telehealth visit is felt to be most appropriate for this patient at this time.  See MyChart message from today for the patient's consent to telehealth for [facility].  Date:  10/10/2019   ID:  Henry Mcpherson, DOB 1955/08/11, MRN [id number]  Location: patient's home  Provider location:  [place] [facility]  Evaluation Performed: Follow-up visit  PCP:  System, Pcp Not In   Electrophysiologist:  Dr Johney Frame  Chief Complaint:  palpitations  History of Present Illness:    Henry Mcpherson is a 64 y.o. male who presents via telehealth conferencing today.  Since last being seen in our clinic, the patient reports doing very well.  He has made substantial improvements.  No longer requires O2.  Edema/ anasarca is much much better.  Limited to walking with a walker at home.  Is not real happy with having a colostomy.  Today, he denies symptoms of palpitations, chest pain, shortness of breath,  dizziness, presyncope, or syncope.  The patient is otherwise without complaint today.   Past Medical History:  Diagnosis Date  . CAD (coronary artery disease)   . CHF (congestive heart failure) (HCC)   . COPD (chronic obstructive pulmonary disease) (HCC)   . DDD (degenerative disc disease), cervical   . Diabetes mellitus without complication (H[facility])   . Hypertension   . Osteomyelitis (H[facility])   . Persistent atrial fibrillation (HCC)   . RBBB   . Sleep apnea   . Tachycardia-bradycardia syndrome [facility])     Past Surgical History:  Procedure Laterality Date  . BACK SURGERY    . BUBBLE STUDY  10/13/2018   Procedure: BUBBLE STUDY;  Surgeon: Parke Poisson, MD;  Location[facility] ENDOSCOPY;  Service: Cardiology;;  . CORONARY ARTERY BYPASS GRAFT     1980s at [facility] in [place]  . IABP INSERTION N/A 11/16/2018   Procedure: IABP Insertion;  Surgeon: Laurey Morale, MD;  Location: [facility] INVASIVE CV LAB;  Service: Cardiovascular;  Laterality: N/A;  . IR LUMBAR DISC [place] W/IMG GUIDE  10/12/2018  . LAMINECTOMY    . LEFT HEART CATH AND CORONARY ANGIOGRAPHY N/A 11/16/2018   Procedure: LEFT HEART CATH AND CORONARY ANGIOGRAPHY;  Surgeon: Swaziland, Peter M, MD;  Location: [facility] INVASIVE CV LAB;  Service: Cardiovascular;  Laterality: N/A;  . PACEMAKER GENERATOR CHANGE  10/30/2015   SJM Assurity VR by Dr Blanch Media in [place]  . PACEMAKER IMPLANT  05/01/2009   SJM PPM implanted by Dr Desma Paganini for bradycardia in [place]  . PACEMAKER LEAD REMOVAL N/A 10/16/2018   Procedure: PACEMAKER EXTRACTION;  Surgeon: Marinus Maw, MD;  Location: [facility] OR;  Service: Cardiovascular;  Laterality: N/A;  . RIGHT HEART CATH N/A 11/16/2018   Procedure: RIGHT HEART CATH;  Surgeon: Swaziland, Peter M, MD;  Location: [facility] INVASIVE CV LAB;  Service: Cardiovascular;  Laterality: N/A;  . RIGHT HEART CATH N/A 11/16/2018   Procedure: RIGHT HEART CATH;  Surgeon: Laurey Morale, MD;  Location: [facility] INVASIVE CV LAB;  Service: Cardiovascular;  Laterality: N/A;  . TEE WITHOUT CARDIOVERSION N/A 10/13/2018   Procedure: TRANSESOPHAGEAL ECHOCARDIOGRAM (TEE);  Surgeon: Parke Poisson, MD;  Location: [facility] ENDOSCOPY;  Service: Cardiology;  Laterality: N/A;  . TEE WITHOUT CARDIOVERSION N/A 10/16/2018   Procedure: TRANSESOPHAGEAL ECHOCARDIOGRAM (TEE);  Surgeon: Marinus Maw, MD;  Location: [facility] OR;  Service: Cardiovascular;  Laterality: N/A;  .  TEMPORARY PACEMAKER N/A 11/16/2018   Procedure: TEMPORARY PACEMAKER;  Surgeon: Swaziland, Peter M, MD;  Location: [facility] INVASIVE CV LAB;  Service: Cardiovascular;  Laterality: N/A;  . TOE AMPUTATION      Current Outpatient Medications  Medication Sig Dispense Refill  . acetaminophen (TYLENOL) 650 MG CR tablet Take 650 mg by mouth every 8 (eight) hours as needed for pain.    Marland Kitchen albuterol (PROVENTIL HFA;VENTOLIN HFA) 108 (90 Base) MCG/ACT inhaler Inhale 1-2 puffs into the lungs  every 6 (six) hours as needed for wheezing or shortness of breath.    Marland Kitchen atorvastatin (LIPITOR) 40 MG tablet Take 40 mg by mouth daily.     . bumetanide (BUMEX) 1 MG tablet Take 1 tablet by mouth in the morning and at bedtime.    . CVS VITAMIN B12 1000 MCG tablet Take 1,000 mcg by mouth daily.    . cyclobenzaprine (FLEXERIL) 10 MG tablet Take 1 tablet (10 mg total) by mouth 2 (two) times daily as needed for muscle spasms. 30 tablet 0  . ELIQUIS 5 MG TABS tablet Take 5 mg by mouth 2 (two) times daily.    . insulin lispro (HUMALOG) 100 UNIT/ML injection Inject 5 Units into the skin daily.    Marland Kitchen lactobacillus acidophilus & bulgar (LACTINEX) chewable tablet Chew 4 tablets by mouth 3 (three) times daily.    Marland Kitchen LANTUS SOLOSTAR 100 UNIT/ML Solostar Pen Inject 5 Units into the skin daily.    Marland Kitchen loperamide (IMODIUM) 2 MG capsule Take 2 mg by mouth 4 (four) times daily.    . magnesium oxide (MAG-OX) 400 MG tablet Take 1 tablet by mouth 2 (two) times daily.    . metolazone (ZAROXOLYN) 2.5 MG tablet Take 1 tablet by mouth every other day.    . oxyCODONE (OXY IR/ROXICODONE) 5 MG immediate release tablet Take 1 tablet by mouth as needed for pain.    . pantoprazole (PROTONIX) 40 MG tablet Take 40 mg by mouth 2 (two) times daily.    . sodium bicarbonate 650 MG tablet Take 2 tablets by mouth 2 (two) times daily.     . traMADol (ULTRAM) 50 MG tablet Take 50 mg by mouth every 6 (six) hours as needed for moderate pain.     . Vitamin D, Ergocalciferol, (DRISDOL) 1.25 MG (50000 UNIT) CAPS capsule Take 50,000 Units by mouth once a week.    . zolpidem (AMBIEN) 5 MG tablet Take 1 tablet by mouth as needed for sleep.     No current facility-administered medications for this visit.    Allergies:   Warfarin and related   Social History:  The patient  reports that he has been smoking cigarettes. He has never used smokeless tobacco. He reports previous alcohol use. He reports previous drug use.   ROS:  Please see the  history of present illness.   All other systems are personally reviewed and negative.    Exam:    Vital Signs:  BP 116/70   Pulse 71   Ht 6\' 2"  (1.88 m)   Wt 235 lb (106.6 kg)   BMI 30.17 kg/m   Well sounding and appearing, alert and conversant, regular work of breathing,  good skin color Eyes- anicteric, neuro- grossly intact, skin- no apparent rash or lesions or cyanosis, mouth- oral mucosa is pink  Labs/Other Tests and Data Reviewed:    Recent Labs: 10/17/2018: B Natriuretic Peptide 231.2 10/19/2018: ALT 7 11/21/2018: BUN 20; Creatinine, Ser 0.77; Hemoglobin 8.8; Magnesium 1.9; Platelets 218; Potassium 3.4;  Sodium 139   Wt Readings from Last 3 Encounters:  10/10/19 235 lb (106.6 kg)  07/09/19 275 lb (124.7 kg)  04/02/19 201 lb (91.2 kg)      ASSESSMENT & PLAN:    1.  Chronic diastolic dysfunction Follows very closely with Dr Alden Server and has made dramatic improvement clinically  2. afib previously on aimodarone Clinically stable Poor candidate for anticoagulation due to prior bleeding  3. CAD No ischemic symptoms  4. Sick sinus syndrome Asymptomatic  5. VT No recurrence EF has recovered Conservative management is advised   Risks, benefits and potential toxicities for medications prescribed and/or refilled reviewed with patient today.   Follow-up:  6 months with me   Patient Risk:  after full review of this patients clinical status, I feel that they are at moderate risk at this time.  Today, I have spent 15 minutes with the patient with telehealth technology discussing arrhythmia management .    Army Fossa, MD  10/10/2019 11:30 AM     [facility] [address] [place] [address] [phone] (office) [phone] (fax)

## 2019-10-22 ENCOUNTER — Other Ambulatory Visit: Payer: Self-pay | Admitting: Internal Medicine

## 2019-10-22 DIAGNOSIS — B9562 Methicillin resistant Staphylococcus aureus infection as the cause of diseases classified elsewhere: Secondary | ICD-10-CM

## 2019-10-23 ENCOUNTER — Other Ambulatory Visit: Payer: Self-pay | Admitting: Internal Medicine

## 2019-10-23 DIAGNOSIS — R7881 Bacteremia: Secondary | ICD-10-CM

## 2020-02-04 IMAGING — DX PORTABLE CHEST - 1 VIEW
1 series · 1 of 1 positions shown · non-contrast
Comparison: 10/21/2018

CLINICAL DATA: CHF.  Current smoker.

EXAM:
PORTABLE CHEST 1 VIEW

[chest ap]
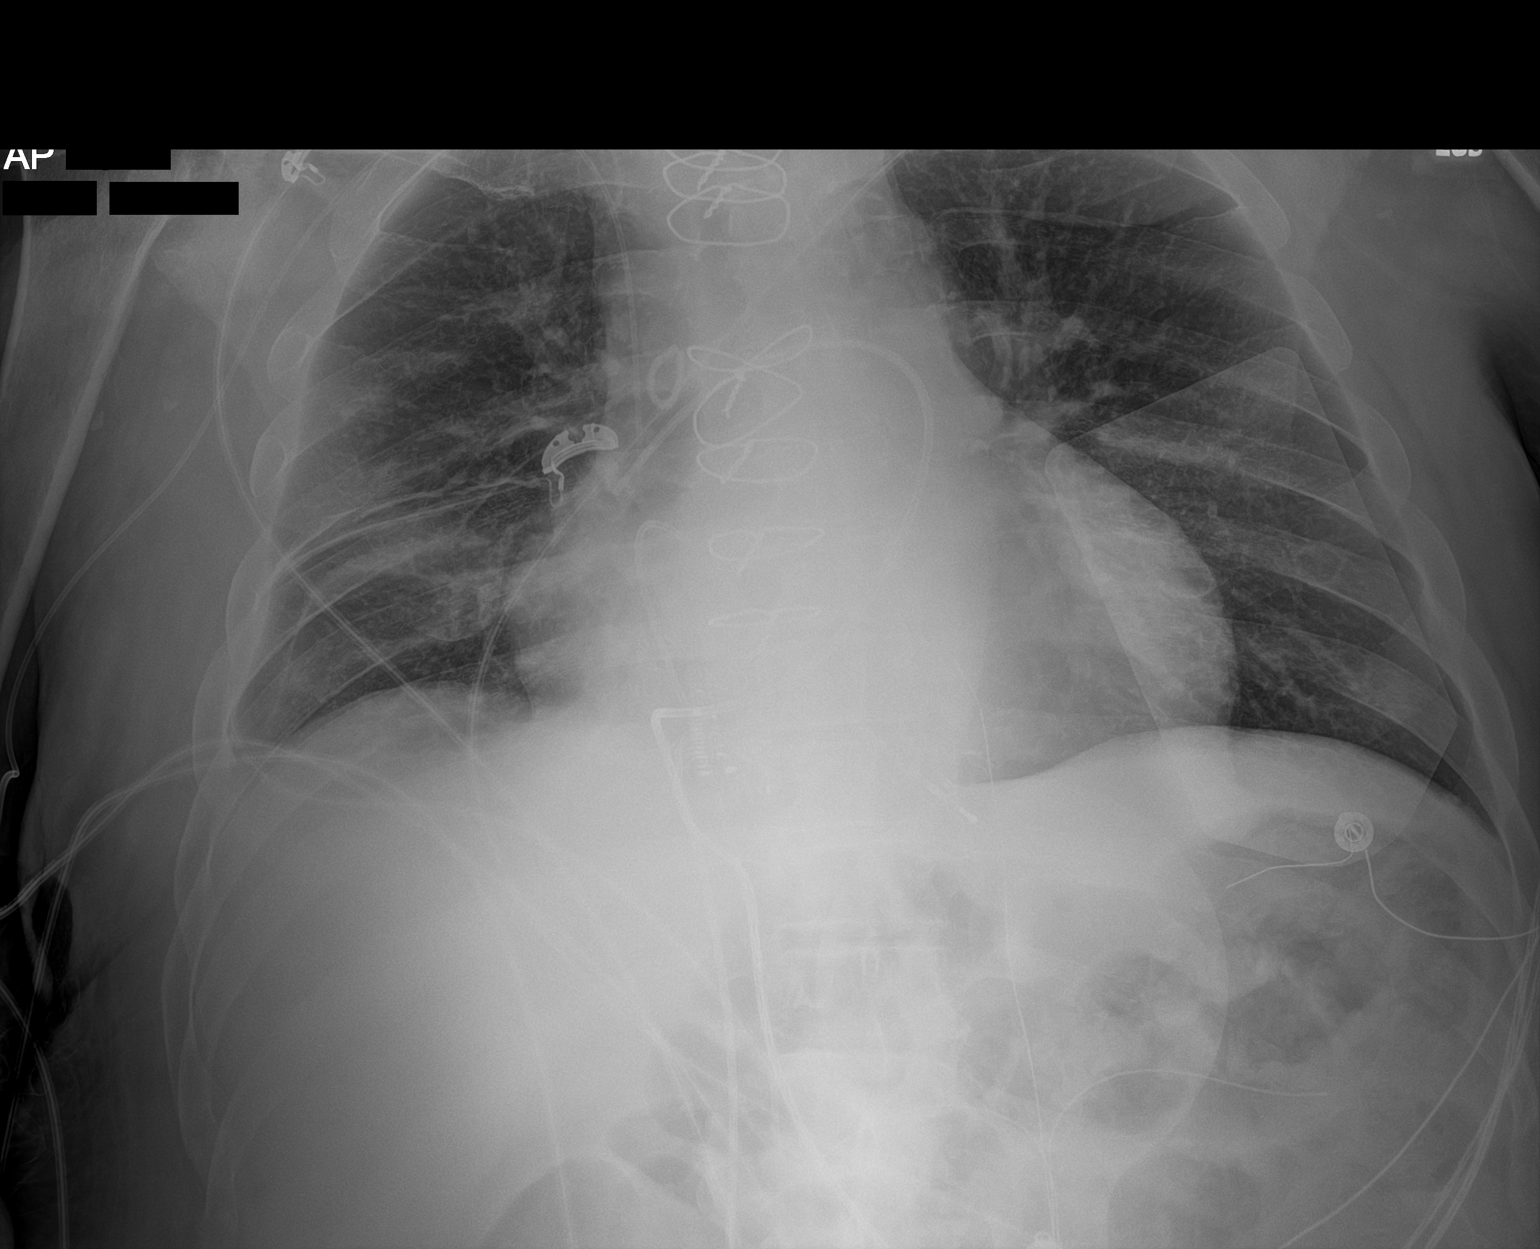

[1 of 1 positions shown; findings below may reference images not displayed]

FINDINGS: Right-sided PICC line and sternotomy wires unchanged. Catheter from
below extending into the region of the proximal right lower lobar
pulmonary artery. Small caliber lead projects over the left lower
heart. Interval removal of right-sided pleural pigtail drainage
catheter.

Lungs are hypoinflated with mild interval improvement of right
middle lobe opacification likely improving atelectasis. No evidence
of effusion or pneumothorax. Mild stable cardiomegaly. Remainder of
the exam is unchanged.
IMPRESSION: Improving right middle lobe density likely improving atelectasis.

Tubes and lines as described.

## 2020-02-09 IMAGING — CT CT LUMBAR SPINE WITHOUT CONTRAST
3 of 4 series · 12 of 33 positions shown, 14 images · non-contrast
Comparison: Multiple previous imaging studies including CT lumbar
spine 10/11/2018 and MRI lumbar spine 10/17/2018

CLINICAL DATA: Low back pain for 1 month.

EXAM:
CT LUMBAR SPINE WITHOUT CONTRAST
TECHNIQUE: Multidetector CT imaging of the lumbar spine was performed without
intravenous contrast administration. Multiplanar CT image
reconstructions were also generated.

[Series 5: l spine 2.0 (person_name) (person_name) · axial · 0.34mm/px · z∈[+1130,+1332]mm · 4 of 143 slices shown, 5 images]
[im 21/143  soft-tissue]
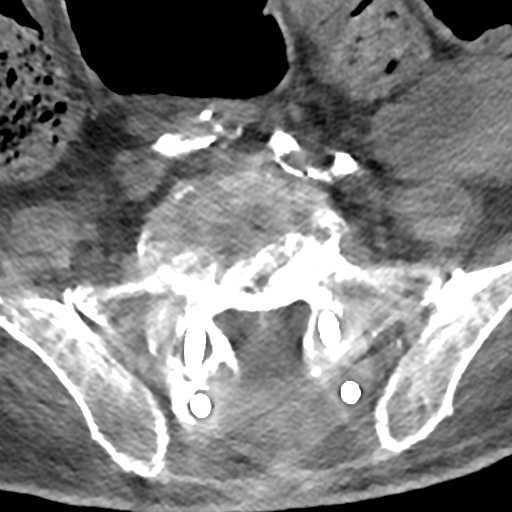
[im 21/143  bone]
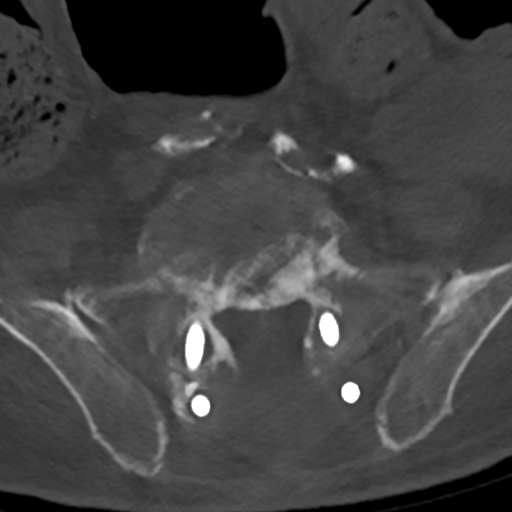
[im 61/143  bone]
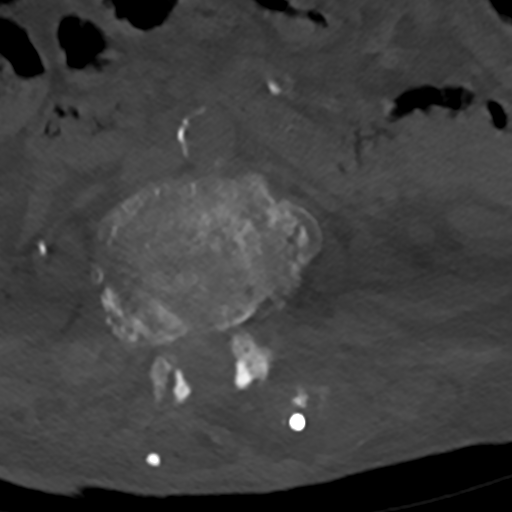
[im 82/143  bone]
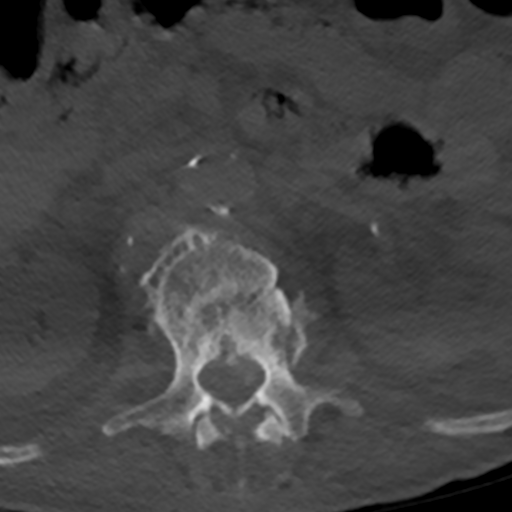
[im 122/143  bone]
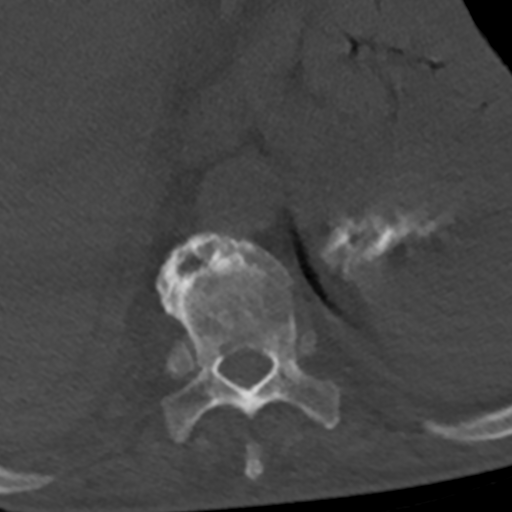

[Series 8: coronal st · coronal · 0.42mm/px · 3 of 65 slices shown]
[im 13/65  bone]
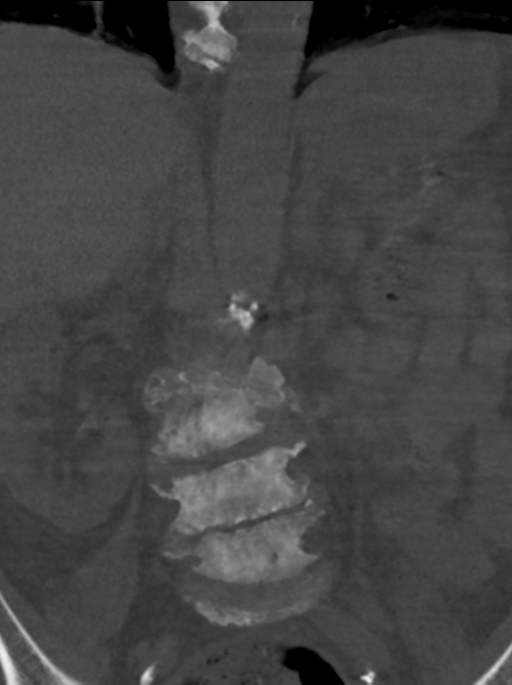
[im 26/65  bone]
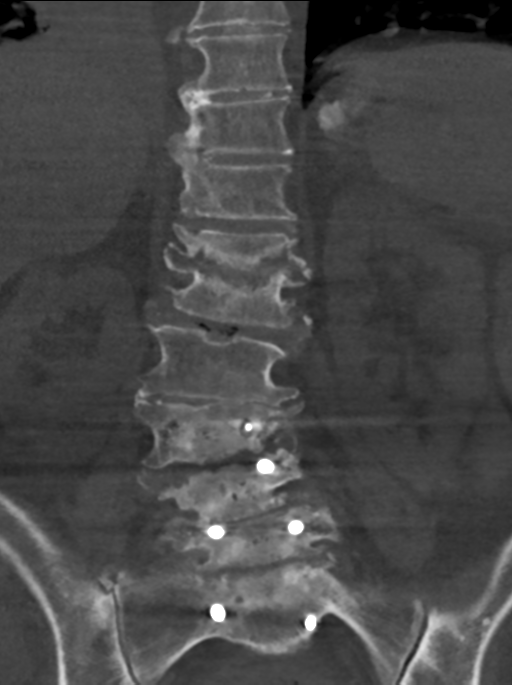
[im 39/65  bone]
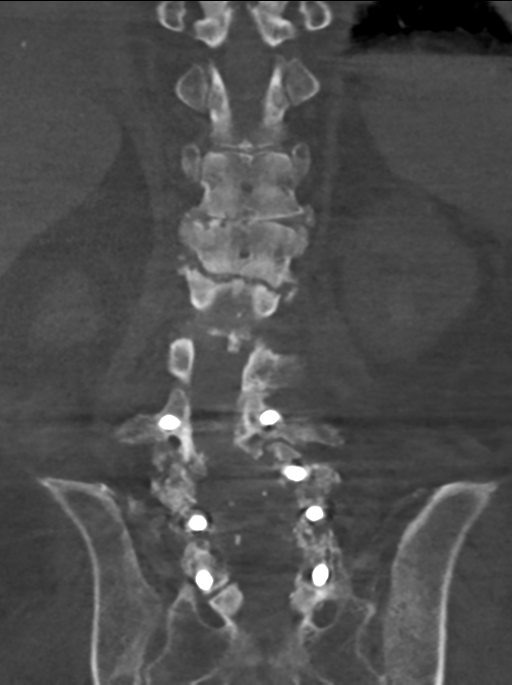

[Series 9: sagittal st · sagittal · 0.35mm/px · 5 of 61 slices shown, 6 images]
[im 21/61  bone]
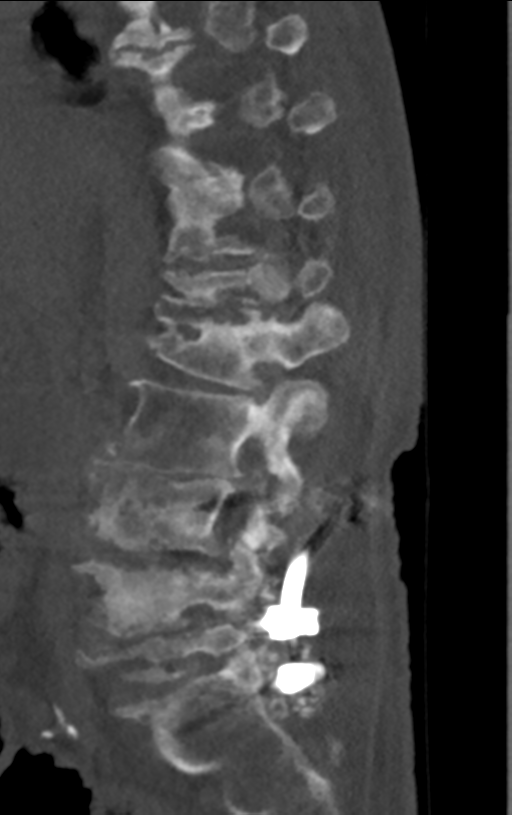
[im 26/61  bone]
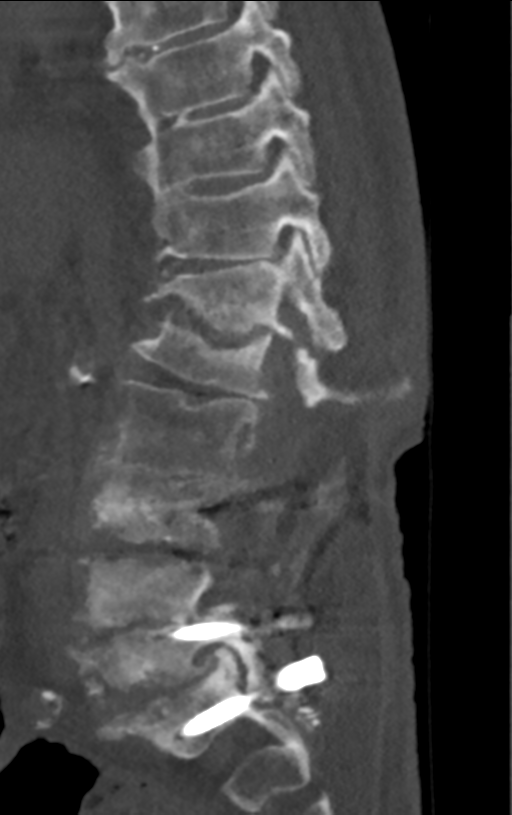
[im 31/61  soft-tissue]
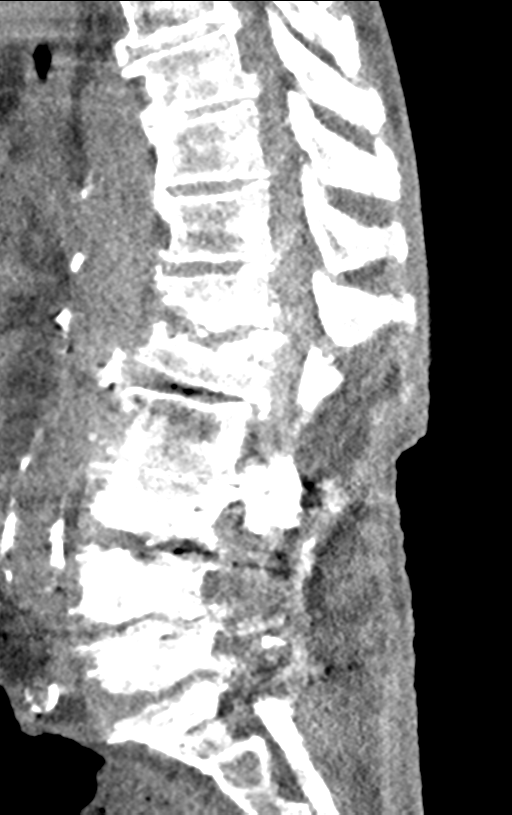
[im 31/61  bone]
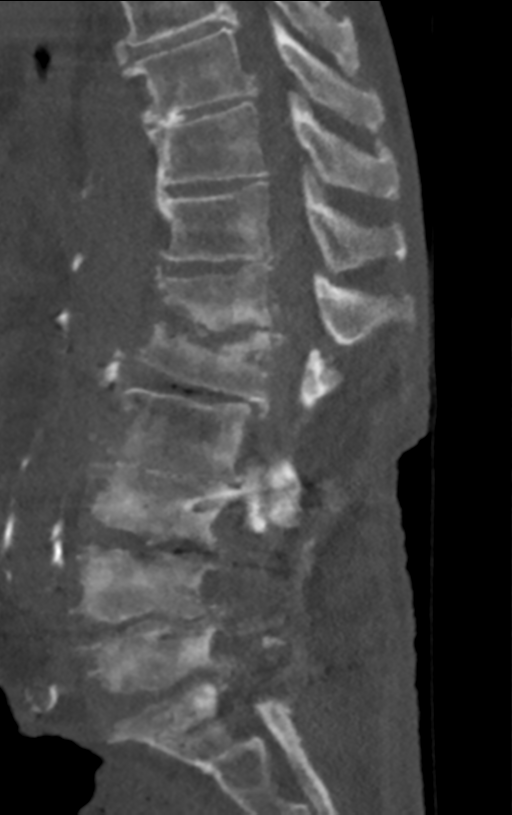
[im 36/61  bone]
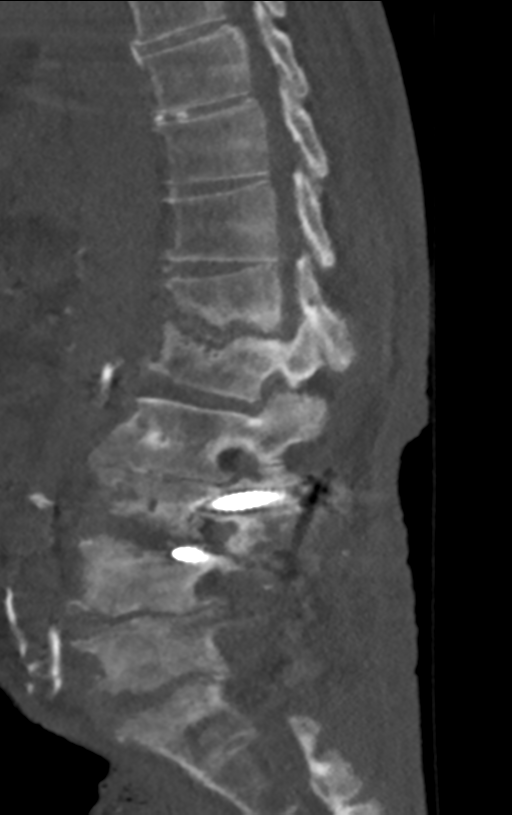
[im 41/61  bone]
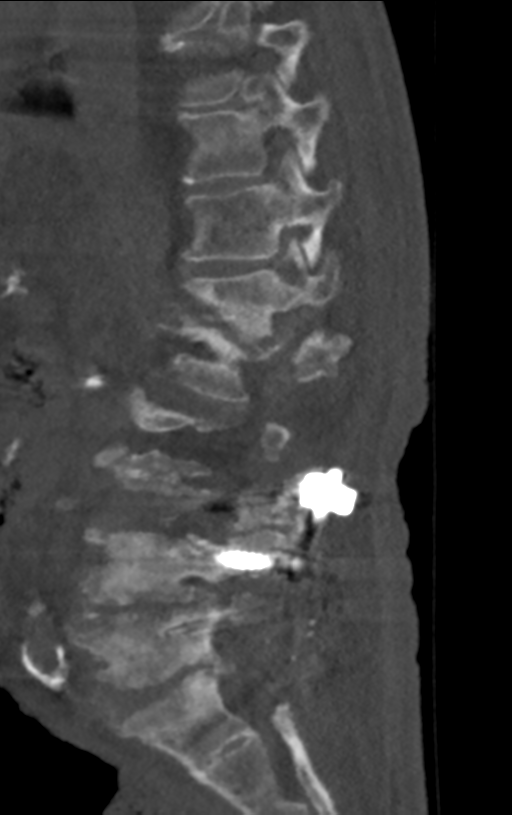

[12 of 33 positions shown; findings below may reference images not displayed]

FINDINGS: Segmentation: There are five lumbar type vertebral bodies. The last
full intervertebral disc space is labeled L5-S1. This correlates
with the prior studies.

Alignment: Grossly normal and stable alignment.

Vertebrae: Fusion hardware again noted from L3-S1. Persistent mild
lucency around the left L3 pedicle screw. The other pedicle screws
are intact.

Chronic changes of diskitis and osteomyelitis at T12-L1. No new or
progressive findings. Stable retropulsion at T12 with mild canal
stenosis.

Stable severe degenerative/endplate reactive changes at L3-4, L4-5
and L5-S1. No new or progressive changes. Solid-appearing interbody
fusion at L2-3.

Paraspinal and other soft tissues: No paraspinal abscess is
identified. Right basilar airspace opacity is noted.

Advanced atherosclerotic calcifications involving the aorta and
branch vessels.

Disc levels: T11-12: Stable bulging annulus and osteophytic ridging
with flattening of the ventral thecal sac.

T12-L1: Stable/chronic changes of diskitis and osteomyelitis with
discogenic sclerosis. Stable retropulsion of T12 inferiorly. Mild
canal stenosis. No obvious epidural abscess.

L1-2: Stable advanced degenerate disc disease with a bulging
uncovered disc and osteophytic ridging with mild mass effect on the
thecal sac, asymmetric left. Wide decompressive laminectomy.

L2-3: Interbody fusion changes along with posterior hardware fusion.
Wide decompressive laminectomy. No spinal or obvious foraminal
stenosis.

L3-4: Wide decompressive laminectomy. Left-sided L4 pedicle screw in
place. No obvious spinal or foraminal stenosis. Severe degenerate
disc disease.

L4-5: Wide decompressive laminectomy. L5 pedicle screws in good
position. No obvious spinal or foraminal stenosis.

L5-S1: Wide decompressive laminectomy. No obvious spinal or
foraminal stenosis.
IMPRESSION: 1. Remote/chronic changes at T12-L1 from prior discitis
osteomyelitis. No new or progressive findings.
2. Stable severe degenerative disc disease at L3-4, L4-5 and L5-S1
but there are wide decompressive laminectomies and no significant
spinal or foraminal stenosis.
3. Solid-appearing interbody fusion at L2-3.
4. Stable mild lucency around the left L3 pedicle screw suggesting
loosening.

## 2020-02-18 ENCOUNTER — Telehealth: Payer: Self-pay | Admitting: Internal Medicine

## 2020-02-18 NOTE — Telephone Encounter (Signed)
The patient called this evening complaining of worsening volume overload.  + marked edema and worsening SOB.  He reports that he was evaluated by Dr Blanch Media 01/30/20 (his note reviewed) and diuretics were discontinued due to acute renal failure.  He has subsequently developed worsening volume overload.  He presented to [facility] 02/14/20 with marked anemia and required PRBCs (notes reviewed by me).  At that time, his BUN was 90s and Creat 3.  He was noted to have heart rates 60s on ekg but apparently had transient bradycardia while in the ER.  He had a holter monitor placed (results pending).  He contact Dr Salvadore Oxford office today but was unable to speak with Dr Blanch Media.  He has been advised to see nephrology but has not yet made the appointment.  Presently, he denies CP, dizziness, presyncope, or syncope. I have reviewed [facility] records at length.  My recommendations:  Start bumex 1mg  daily.   Sodium restriction He is instructed to contact nephrology in [facility] to arrange urgent follow-up. He is also instructed to follow closely with Dr [facility] office.  I will follow-up with him from an EP standpoint next week.  Salvadore Oxford MD, [facility] 02/18/2020 7:53 PM

## 2020-02-27 ENCOUNTER — Telehealth: Payer: Medicare Other | Admitting: Internal Medicine

## 2020-04-02 DEATH — deceased

## 2020-05-03 DEATH — deceased
